# Patient Record
Sex: Male | Born: 1970
Health system: Southern US, Community
[De-identification: ages and names within clinical notes are randomized; demographics above are authoritative.]

## PROBLEM LIST (undated history)

## (undated) DIAGNOSIS — I5022 Chronic systolic (congestive) heart failure: Secondary | ICD-10-CM

## (undated) DIAGNOSIS — I255 Ischemic cardiomyopathy: Secondary | ICD-10-CM

## (undated) DIAGNOSIS — F1010 Alcohol abuse, uncomplicated: Secondary | ICD-10-CM

## (undated) DIAGNOSIS — F988 Other specified behavioral and emotional disorders with onset usually occurring in childhood and adolescence: Secondary | ICD-10-CM

## (undated) DIAGNOSIS — I2510 Atherosclerotic heart disease of native coronary artery without angina pectoris: Secondary | ICD-10-CM

## (undated) DIAGNOSIS — E785 Hyperlipidemia, unspecified: Secondary | ICD-10-CM

## (undated) DIAGNOSIS — F419 Anxiety disorder, unspecified: Secondary | ICD-10-CM

## (undated) DIAGNOSIS — I10 Essential (primary) hypertension: Secondary | ICD-10-CM

## (undated) DIAGNOSIS — F329 Major depressive disorder, single episode, unspecified: Secondary | ICD-10-CM

## (undated) DIAGNOSIS — F32A Depression, unspecified: Secondary | ICD-10-CM

## (undated) HISTORY — DX: Anxiety disorder, unspecified: F41.9

## (undated) HISTORY — DX: Depression, unspecified: F32.A

## (undated) HISTORY — DX: Ischemic cardiomyopathy: I25.5

## (undated) HISTORY — DX: Chronic systolic (congestive) heart failure: I50.22

## (undated) HISTORY — DX: Hyperlipidemia, unspecified: E78.5

## (undated) HISTORY — DX: Alcohol abuse, uncomplicated: F10.10

## (undated) HISTORY — DX: Other specified behavioral and emotional disorders with onset usually occurring in childhood and adolescence: F98.8

## (undated) HISTORY — DX: Major depressive disorder, single episode, unspecified: F32.9

## (undated) HISTORY — DX: Atherosclerotic heart disease of native coronary artery without angina pectoris: I25.10

---

## 2006-05-06 ENCOUNTER — Emergency Department: Payer: Self-pay | Admitting: Emergency Medicine

## 2010-07-16 ENCOUNTER — Ambulatory Visit: Payer: Self-pay | Admitting: Urology

## 2011-10-19 ENCOUNTER — Ambulatory Visit (INDEPENDENT_AMBULATORY_CARE_PROVIDER_SITE_OTHER): Payer: 59

## 2011-10-19 DIAGNOSIS — Z0000 Encounter for general adult medical examination without abnormal findings: Secondary | ICD-10-CM

## 2011-10-19 DIAGNOSIS — E782 Mixed hyperlipidemia: Secondary | ICD-10-CM

## 2011-10-19 DIAGNOSIS — Z23 Encounter for immunization: Secondary | ICD-10-CM

## 2011-10-21 ENCOUNTER — Encounter (INDEPENDENT_AMBULATORY_CARE_PROVIDER_SITE_OTHER): Payer: 59

## 2011-10-21 DIAGNOSIS — Z111 Encounter for screening for respiratory tuberculosis: Secondary | ICD-10-CM

## 2011-12-05 ENCOUNTER — Encounter: Payer: Self-pay | Admitting: Physician Assistant

## 2011-12-05 ENCOUNTER — Ambulatory Visit (INDEPENDENT_AMBULATORY_CARE_PROVIDER_SITE_OTHER): Payer: 59 | Admitting: Physician Assistant

## 2011-12-05 VITALS — BP 129/81 | HR 89 | Resp 16

## 2011-12-05 DIAGNOSIS — Z23 Encounter for immunization: Secondary | ICD-10-CM

## 2012-03-08 ENCOUNTER — Ambulatory Visit (INDEPENDENT_AMBULATORY_CARE_PROVIDER_SITE_OTHER): Payer: 59 | Admitting: Physician Assistant

## 2012-03-08 VITALS — BP 136/82 | HR 81 | Temp 98.3°F | Resp 16

## 2012-03-08 DIAGNOSIS — Z7189 Other specified counseling: Secondary | ICD-10-CM

## 2012-03-08 DIAGNOSIS — Z23 Encounter for immunization: Secondary | ICD-10-CM

## 2012-03-08 MED ORDER — INFLUENZA VIRUS VACC SPLIT PF IM SUSP
0.5000 mL | INTRAMUSCULAR | Status: AC
  Administered 2012-03-08: 0.5 mL via INTRAMUSCULAR

## 2012-03-08 NOTE — Progress Notes (Signed)
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  He has had tdap and ppd in Jan 2013.  He thinks he has had MMR and his wife is going to obtain his transcripts from school.  Today he needs influenza vaccine and varicella titers. He is due for his Hep B #3 next month.

## 2012-03-29 ENCOUNTER — Ambulatory Visit (INDEPENDENT_AMBULATORY_CARE_PROVIDER_SITE_OTHER): Payer: 59 | Admitting: Physician Assistant

## 2012-03-29 VITALS — BP 143/84 | HR 81 | Temp 98.3°F | Resp 16

## 2012-03-29 DIAGNOSIS — Z0000 Encounter for general adult medical examination without abnormal findings: Secondary | ICD-10-CM

## 2012-03-29 DIAGNOSIS — Z23 Encounter for immunization: Secondary | ICD-10-CM

## 2012-03-29 MED ORDER — MEASLES, MUMPS & RUBELLA VAC ~~LOC~~ INJ: 0.5000 mL | INJECTION | Freq: Once | SUBCUTANEOUS | Status: DC

## 2012-03-29 NOTE — Progress Notes (Signed)
  Subjective:    Patient ID: Shane [name], male    DOB: 1971-07-14, 41 y.o.   MRN: [id number]  HPI Had PE done and was told he needed to RTC for MMR only.  No patient/provider encounter   Review of Systems Not done    Objective:   Physical Exam  Not done      Assessment & Plan:  MMR given

## 2012-05-16 ENCOUNTER — Ambulatory Visit: Payer: 59

## 2012-05-16 ENCOUNTER — Ambulatory Visit (INDEPENDENT_AMBULATORY_CARE_PROVIDER_SITE_OTHER): Payer: 59 | Admitting: Family Medicine

## 2012-05-16 VITALS — BP 126/94 | HR 78 | Temp 97.8°F | Resp 16 | Ht 74.0 in | Wt 215.0 lb

## 2012-05-16 DIAGNOSIS — M79671 Pain in right foot: Secondary | ICD-10-CM

## 2012-05-16 DIAGNOSIS — M25579 Pain in unspecified ankle and joints of unspecified foot: Secondary | ICD-10-CM

## 2012-05-16 MED ORDER — PREDNISONE 20 MG PO TABS: ORAL_TABLET | ORAL | Status: AC

## 2012-05-16 NOTE — Progress Notes (Signed)
Is a 41 year old gentleman who twisted his foot about a week ago. He's had progressive swelling and pain over the last week. This may be secondary to having gone to visit 2 days this week and walked a lot. Denies fasciculations on the lateral right foot.   Objective: No acute distress Examination right ankle is normal Examination right foot shows swelling in the area of insertion of the peroneus longus with muscle fasciculations in that area. There is no ecchymosis or bony abnormality evident. [facility] reading (PRIMARY) by  Dr. Milus Glazier.   Assessment:

## 2012-05-22 IMAGING — US US PELVIS LIMITED
1 series · 17 of 25 positions shown · non-contrast
Comparison: none

REASON FOR EXAM: paraurethral mass
COMMENTS:

[Series 1: us pelvis limited · 17 of 84 slices shown]
[im 1/84]
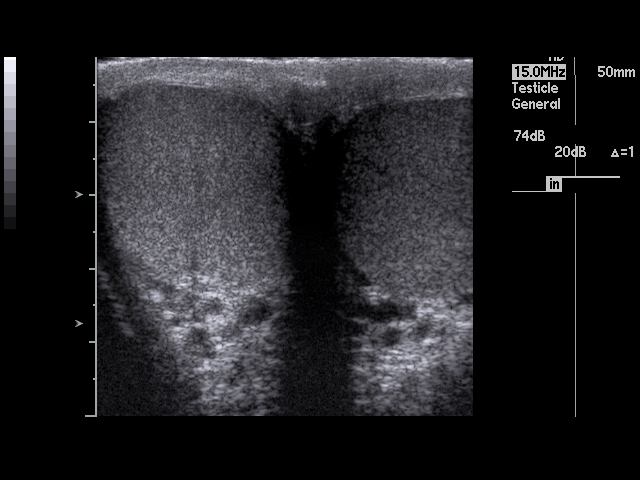
[im 7/84]
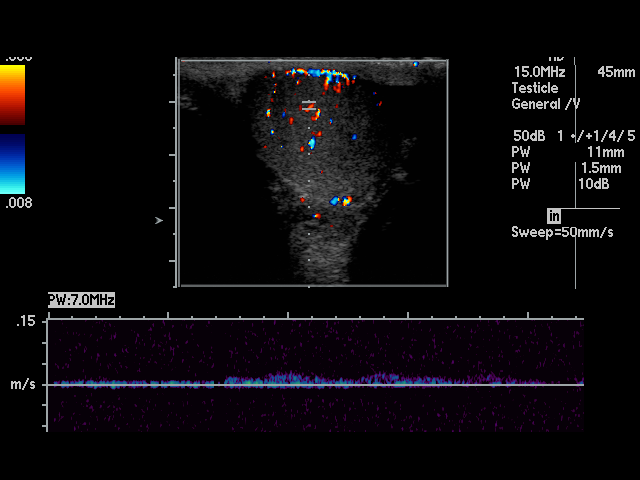
[im 11/84]
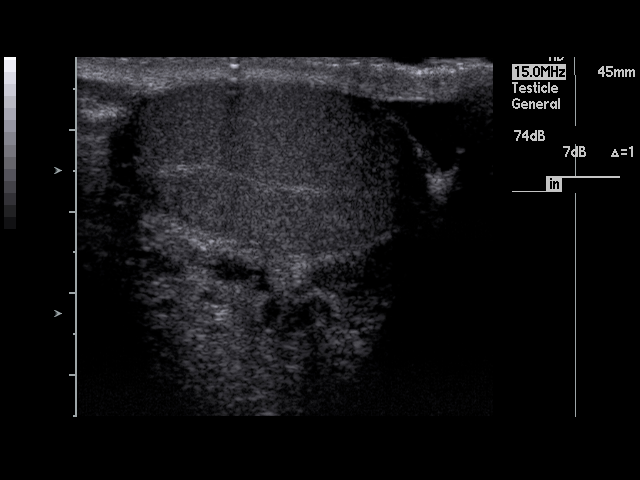
[im 18/84]
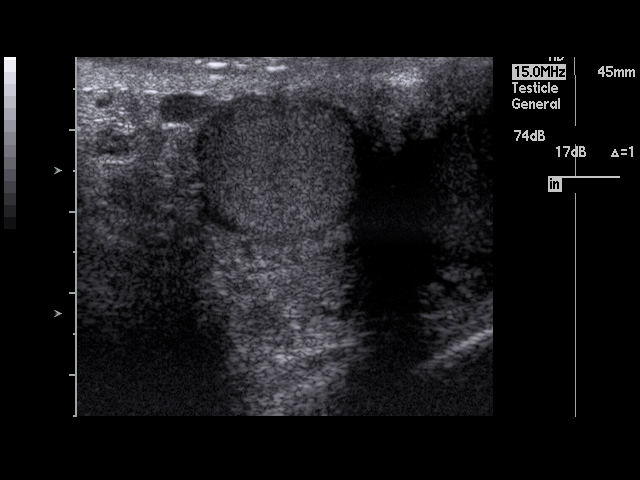
[im 21/84]
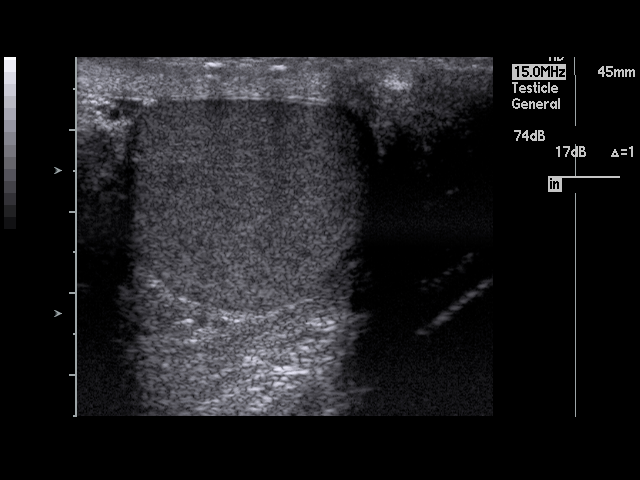
[im 28/84]
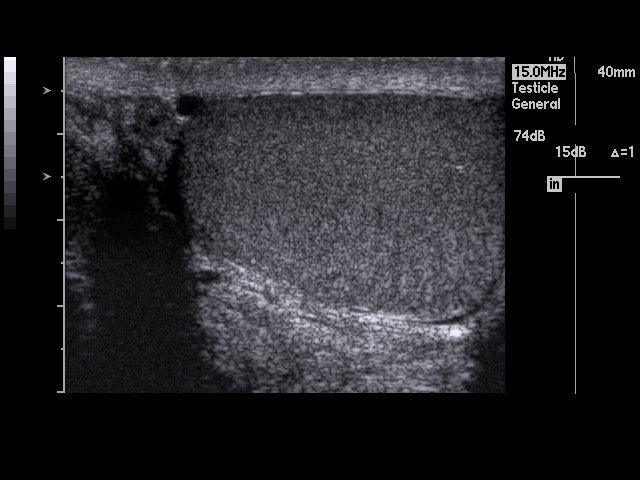
[im 32/84]
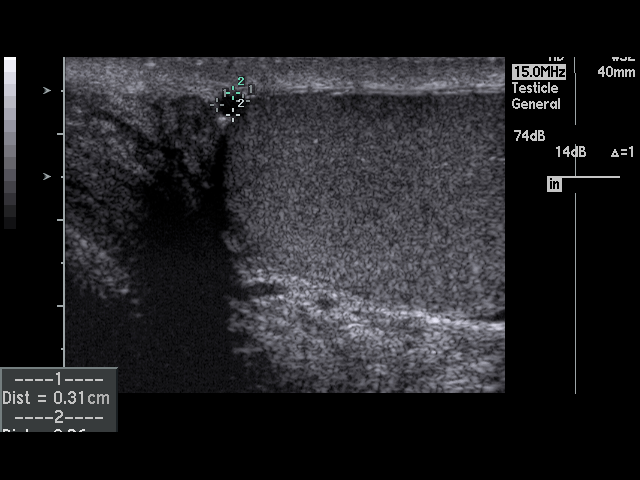
[im 39/84]
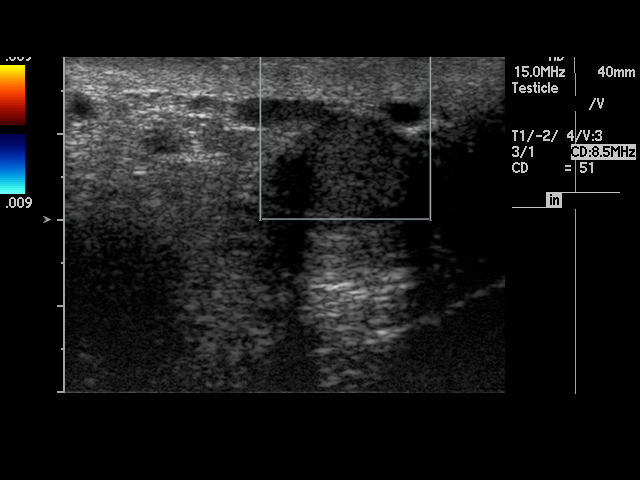
[im 42/84]
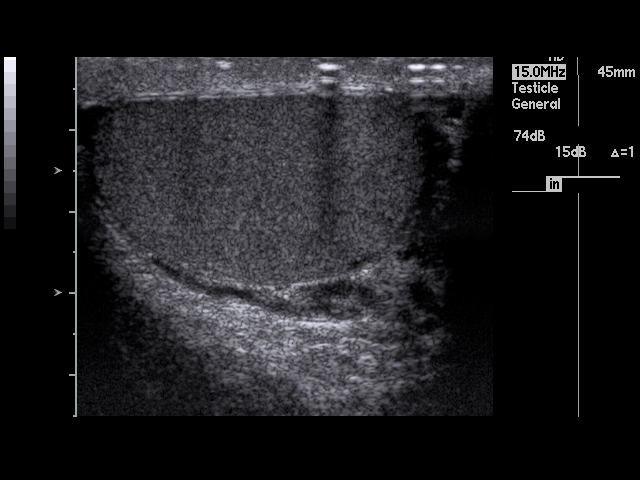
[im 45/84]
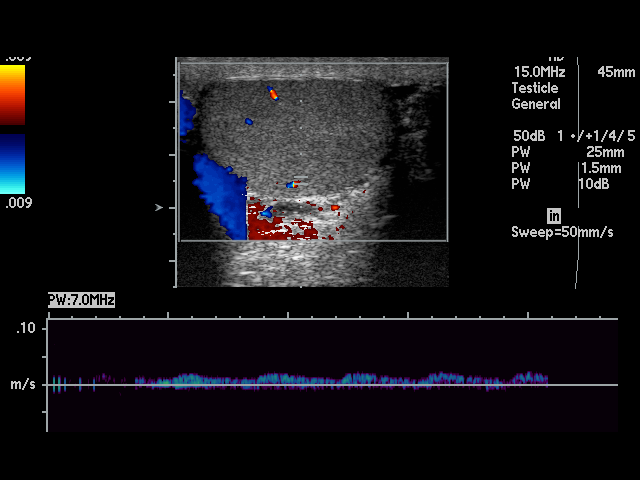
[im 52/84]
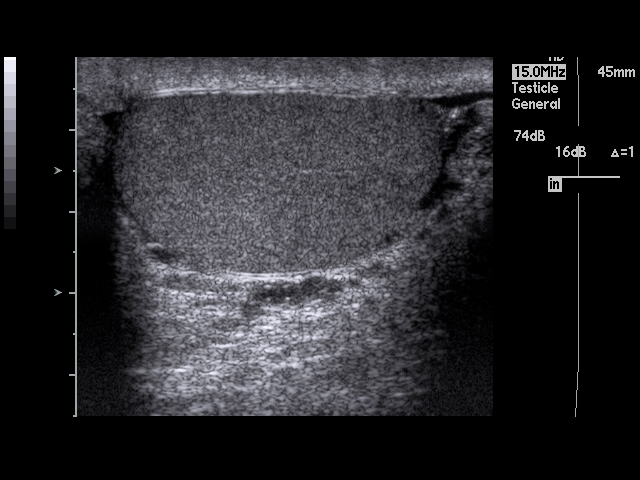
[im 56/84]
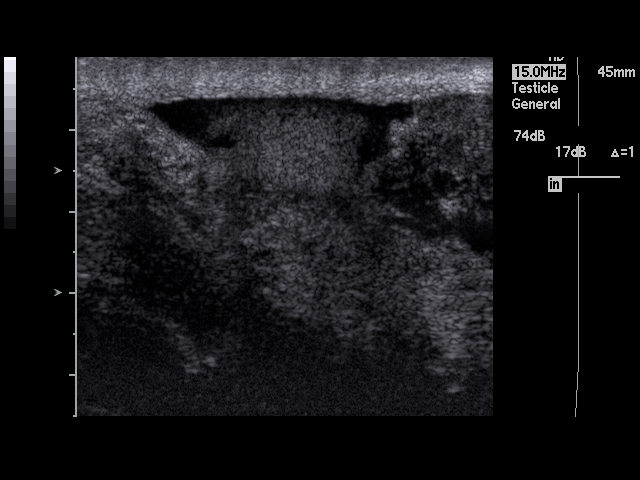
[im 63/84]
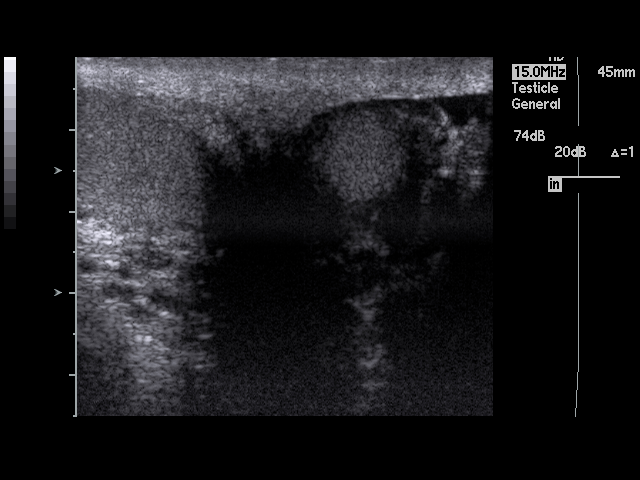
[im 66/84]
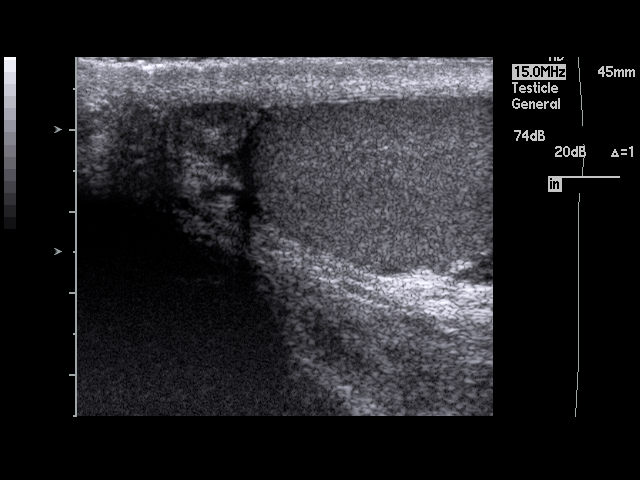
[im 73/84]
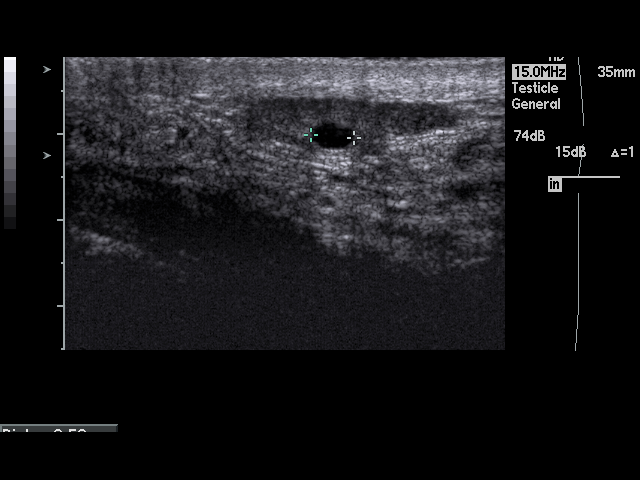
[im 77/84]
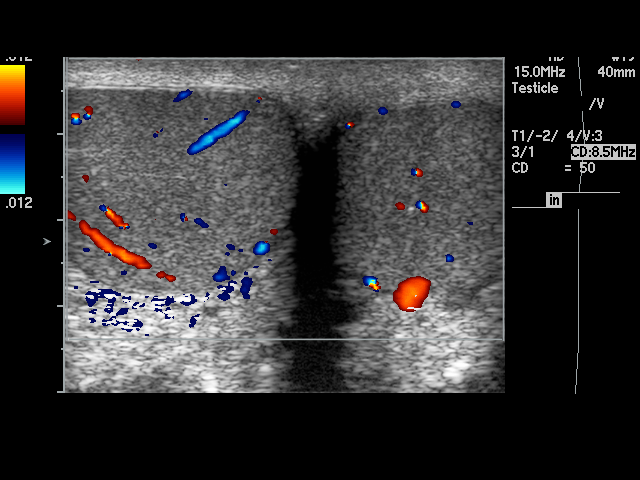
[im 84/84]
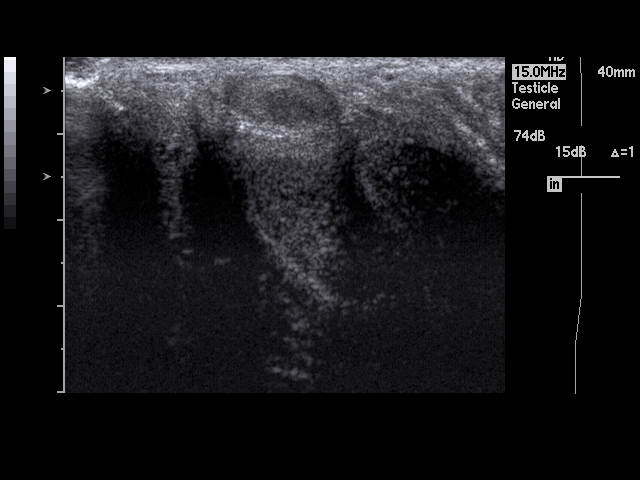

[17 of 25 positions shown; findings below may reference images not displayed]

PROCEDURE:     ATESOGLU - ATESOGLU TESTICULAR  - July 16, 2010 [DATE]

RESULT:     Testicular sonogram demonstrates the right testicle measures
4.26 x 3.03 x 2.73 cm. The left testicle is 3.97 x 2.98 x 2.32 cm. There is
a small hydrocele on the right. Color and SPECTRAL Doppler interrogation
shows arterial and venous flow is present in both testicles. The epididymal
appearance is within normal limits with small epididymal cysts bilaterally.
IMPRESSION: 1.     Epididymal cysts.
2.     Small right hydrocele.
3.     No evidence of torsion.
4.     No discrete mass evident.

## 2012-06-02 ENCOUNTER — Encounter: Payer: Self-pay | Admitting: Emergency Medicine

## 2012-06-02 ENCOUNTER — Ambulatory Visit (INDEPENDENT_AMBULATORY_CARE_PROVIDER_SITE_OTHER): Payer: 59 | Admitting: Emergency Medicine

## 2012-06-02 VITALS — BP 138/72 | HR 81 | Temp 98.7°F | Resp 18

## 2012-06-02 DIAGNOSIS — Z23 Encounter for immunization: Secondary | ICD-10-CM

## 2012-06-02 NOTE — Progress Notes (Signed)
  Subjective:    Patient ID: Shane [name], male    DOB: 1971-01-20, 41 y.o.   MRN: [id number]  HPI  Hepatitis B only  Review of Systems    Hepatitis B only Objective:   Physical Exam   Hepatitis B only     Assessment & Plan:  administered

## 2012-06-16 ENCOUNTER — Ambulatory Visit (INDEPENDENT_AMBULATORY_CARE_PROVIDER_SITE_OTHER): Payer: 59 | Admitting: Physician Assistant

## 2012-06-16 VITALS — BP 124/80 | HR 82 | Temp 97.6°F | Resp 16 | Ht 72.5 in | Wt 215.0 lb

## 2012-06-16 DIAGNOSIS — R21 Rash and other nonspecific skin eruption: Secondary | ICD-10-CM

## 2012-06-16 DIAGNOSIS — L509 Urticaria, unspecified: Secondary | ICD-10-CM

## 2012-06-16 DIAGNOSIS — L282 Other prurigo: Secondary | ICD-10-CM

## 2012-06-16 MED ORDER — PREDNISONE 20 MG PO TABS: ORAL_TABLET | ORAL | Status: AC

## 2012-06-16 MED ORDER — RANITIDINE HCL 150 MG PO TABS: 150.0000 mg | ORAL_TABLET | Freq: Two times a day (BID) | ORAL | Status: DC

## 2012-06-16 MED ORDER — METHYLPREDNISOLONE ACETATE 80 MG/ML IJ SUSP
80.0000 mg | Freq: Once | INTRAMUSCULAR | Status: AC
  Administered 2012-06-16: 80 mg via INTRAMUSCULAR

## 2012-06-16 NOTE — Progress Notes (Signed)
  Subjective:    Patient ID: Shane [name], male    DOB: 1971/09/23, 41 y.o.   MRN: [id number]  HPI 41 year old male presents today after being stung by several yellow jackets yesterday.  All of the stings were on the right side of his torso.  He has had swelling, warmth, and pruritis in the area. Denies SOB, difficulty breathing, lip/tongue swelling, or throat swelling. Has had a similar local reaction in the past.  He has taken several doses of benadryl since last night as well as claritin yesterday.  No medications today.      Review of Systems  All other systems reviewed and are negative.       Objective:   Physical Exam  Constitutional: He is oriented to person, place, and time. He appears well-developed and well-nourished.  HENT:  Head: Normocephalic and atraumatic.  Right Ear: External ear normal.  Left Ear: External ear normal.  Eyes: Conjunctivae normal are normal.  Neck: Normal range of motion.  Cardiovascular: Normal rate and normal heart sounds.   Pulmonary/Chest: Effort normal and breath sounds normal.  Neurological: He is alert and oriented to person, place, and time.  Skin:     Psychiatric: He has a normal mood and affect. His behavior is normal. Judgment and thought content normal.    Zyrtec 10 mg and Zantac 150 mg given today in office.       Assessment & Plan:   1. Rash and nonspecific skin eruption    2. Urticaria  methylPREDNISolone acetate (DEPO-MEDROL) injection 80 mg, predniSONE (DELTASONE) 20 MG tablet, ranitidine (ZANTAC) 150 MG tablet  3. Pruritic rash    Depomedrol 80 mg today Start prednisone tomorrow Claritin daily in the a.m. Benadryl at night Zantac bid RTC precautions

## 2012-12-17 ENCOUNTER — Ambulatory Visit (INDEPENDENT_AMBULATORY_CARE_PROVIDER_SITE_OTHER): Payer: 59 | Admitting: Physician Assistant

## 2012-12-17 VITALS — BP 138/98 | HR 108 | Temp 98.0°F | Resp 16 | Ht 72.5 in | Wt 203.4 lb

## 2012-12-17 DIAGNOSIS — E785 Hyperlipidemia, unspecified: Secondary | ICD-10-CM | POA: Insufficient documentation

## 2012-12-17 DIAGNOSIS — E7800 Pure hypercholesterolemia, unspecified: Secondary | ICD-10-CM

## 2012-12-17 DIAGNOSIS — F1020 Alcohol dependence, uncomplicated: Secondary | ICD-10-CM | POA: Insufficient documentation

## 2012-12-17 DIAGNOSIS — F411 Generalized anxiety disorder: Secondary | ICD-10-CM

## 2012-12-17 DIAGNOSIS — R030 Elevated blood-pressure reading, without diagnosis of hypertension: Secondary | ICD-10-CM

## 2012-12-17 LAB — POCT CBC
MCH, POC: 30.5 pg (ref 27–31.2)
MCHC: 32.1 g/dL (ref 31.8–35.4)
MCV: 94.8 fL (ref 80–97)
MID (cbc): 0.3 (ref 0–0.9)
MPV: 10.3 fL (ref 0–99.8)
POC LYMPH PERCENT: 28.3 %L (ref 10–50)
POC MID %: 6.1 %M (ref 0–12)
Platelet Count, POC: 308 10*3/uL (ref 142–424)
RDW, POC: 14.4 %
WBC: 4.5 10*3/uL — AB (ref 4.6–10.2)

## 2012-12-17 MED ORDER — BUPROPION HCL ER (SR) 150 MG PO TB12: 150.0000 mg | ORAL_TABLET | Freq: Two times a day (BID) | ORAL | Status: DC

## 2012-12-17 NOTE — Patient Instructions (Addendum)
Start wellbutrin tomorrow and take 1 daily each morning.  If you are tolerating this ok after 4 or 5 days, you may increase to twice daily

## 2012-12-17 NOTE — Progress Notes (Signed)
  Subjective:    Patient ID: Shane [name], male    DOB: [birth year], 42 y.o.   MRN: [id number]  HPI 42 yr old CM presents with feelings of depression.  He has been drinking too much alcohol and is having to stay at his parent's house. (his wife and kids are staying in the family home). He admits to over-drinking at times.  He has never been to treatment or done any alcohol cessation program.  He has never been to an AA meeting.  He feels a lot of shame and guilt.  He feels as though he has no will-power. He does not have SI/HI. He has never been on medication for anxiety or depression.  He does feel as though he may have an underlying anxiety d/o that has never been treated.  He states he worries excessively and can't stop thinking.  He usu takes advil PM to help him get to sleep.  He hasn't taken his lipitor in >1year.  Review of Systems  All other systems reviewed and are negative.      Objective:   Physical Exam  Nursing note and vitals reviewed. Constitutional: He is oriented to person, place, and time. He appears well-developed and well-nourished.  HENT:  Head: Normocephalic and atraumatic.  Neck: Normal range of motion. Neck supple.  Cardiovascular: Normal rate, regular rhythm and normal heart sounds.   Pulmonary/Chest: Effort normal and breath sounds normal.  Neurological: He is alert and oriented to person, place, and time.  Skin: Skin is warm and dry.  Psychiatric: He has a normal mood and affect. His behavior is normal. Judgment and thought content normal.     Results for orders placed in visit on 12/17/12  POCT CBC      Result Value Range   WBC 4.5 (*) 4.6 - 10.2 K/uL   Lymph, poc 1.3  0.6 - 3.4   POC LYMPH PERCENT 28.3  10 - 50 %L   MID (cbc) 0.3  0 - 0.9   POC MID % 6.1  0 - 12 %M   POC Granulocyte 3.0  2 - 6.9   Granulocyte percent 65.6  37 - 80 %G   RBC 5.32  4.69 - 6.13 M/uL   Hemoglobin 16.2  14.1 - 18.1 g/dL   HCT, POC 78.2  95.6 - 53.7 %   MCV 94.8  80 - 97  fL   MCH, POC 30.5  27 - 31.2 pg   MCHC 32.1  31.8 - 35.4 g/dL   RDW, POC 21.3     Platelet Count, POC 308  142 - 424 K/uL   MPV 10.3  0 - 99.8 fL       Assessment & Plan:  Hypertension likely related to excess alcohol or anxiety or both.  He has never had BP problems.  Counseled patient at length about alcoholism treatment options from IOP to 30day IP programs.  Discussed halfway houses, AA meetings, and I offered assistance getting connected to these in any way possible.  Check BP out of office. Cut back/cut out sleeping meds. Depression-he will start wellbutrin and see me in 3 weeks.  Hyperlipidemia-will reassess in a couple of months.  With his recent heavy drinking, I dont want to create excess stress to his liver and we can restart this as he works on and maintains sobriety. Spent 45 mins face to face with patient.

## 2012-12-18 LAB — COMPREHENSIVE METABOLIC PANEL
Alkaline Phosphatase: 90 U/L (ref 39–117)
CO2: 27 mEq/L (ref 19–32)
Creat: 1.01 mg/dL (ref 0.50–1.35)
Glucose, Bld: 87 mg/dL (ref 70–99)
Sodium: 138 mEq/L (ref 135–145)
Total Bilirubin: 0.8 mg/dL (ref 0.3–1.2)
Total Protein: 7.6 g/dL (ref 6.0–8.3)

## 2012-12-18 LAB — TSH: TSH: 1.995 u[IU]/mL (ref 0.350–4.500)

## 2012-12-23 ENCOUNTER — Ambulatory Visit (INDEPENDENT_AMBULATORY_CARE_PROVIDER_SITE_OTHER): Payer: 59 | Admitting: Family Medicine

## 2012-12-23 VITALS — BP 160/98 | HR 94 | Temp 97.9°F | Resp 16 | Ht 73.5 in | Wt 197.0 lb

## 2012-12-23 DIAGNOSIS — F1010 Alcohol abuse, uncomplicated: Secondary | ICD-10-CM

## 2012-12-23 DIAGNOSIS — Z719 Counseling, unspecified: Secondary | ICD-10-CM

## 2012-12-23 DIAGNOSIS — F329 Major depressive disorder, single episode, unspecified: Secondary | ICD-10-CM

## 2012-12-23 NOTE — Progress Notes (Signed)
Subjective:    Patient ID: Shane [name], male    DOB: 08-24-1971, 42 y.o.   MRN: [id number]  HPI Shane [name] is a 42 y.o. male "Shane [name]" was  seen 6 days ago - note reviewed -  with discussion of depression, and alcohol abuse.  Elevated BP at that time thought secondary to alcohol.  treatment options from IOP to 30day IP programs were discussed as well as halfway houses, AA meetings, cut back/cut out sleeping meds. Started Wellbutrin 150mg  BID.    Trouble coping with being kicked out of the house 1 week ago.  Guilty and shame for things done.  2 emotional breakdowns this week. Would like to meet one on one with a counselor, wants to fix relationship. Guilty, shameful. Not wanting to eat. Is taking Wellbutrin twice daily.  Last alcohol use 8 days ago. None since that time.  Similar sx's in past.  Went to [employer] meeting once last week.  Felt awkward. Taking otc melatonin.  Less advil pm.   No suicidal or homicidal thoughts or plan.   Not exercising.  Has disconnected from friends that were increasing   Outside BP 126/81 out of office.  Nervous in office.   Prior note reviewed.  Results for orders placed in visit on 12/17/12  COMPREHENSIVE METABOLIC PANEL      Result Value Range   Sodium 138  135 - 145 mEq/L   Potassium 3.9  3.5 - 5.3 mEq/L   Chloride 102  96 - 112 mEq/L   CO2 27  19 - 32 mEq/L   Glucose, Bld 87  70 - 99 mg/dL   BUN 15  6 - 23 mg/dL   Creat 4.09  8.11 - 9.14 mg/dL   Total Bilirubin 0.8  0.3 - 1.2 mg/dL   Alkaline Phosphatase 90  39 - 117 U/L   AST 20  0 - 37 U/L   ALT 23  0 - 53 U/L   Total Protein 7.6  6.0 - 8.3 g/dL   Albumin 4.7  3.5 - 5.2 g/dL   Calcium 9.9  8.4 - 78.2 mg/dL  TSH      Result Value Range   TSH 1.995  0.350 - 4.500 uIU/mL  POCT CBC      Result Value Range   WBC 4.5 (*) 4.6 - 10.2 K/uL   Lymph, poc 1.3  0.6 - 3.4   POC LYMPH PERCENT 28.3  10 - 50 %L   MID (cbc) 0.3  0 - 0.9   POC MID % 6.1  0 - 12 %M   POC Granulocyte 3.0  2 - 6.9    Granulocyte percent 65.6  37 - 80 %G   RBC 5.32  4.69 - 6.13 M/uL   Hemoglobin 16.2  14.1 - 18.1 g/dL   HCT, POC 95.6  21.3 - 53.7 %   MCV 94.8  80 - 97 fL   MCH, POC 30.5  27 - 31.2 pg   MCHC 32.1  31.8 - 35.4 g/dL   RDW, POC 08.6     Platelet Count, POC 308  142 - 424 K/uL   MPV 10.3  0 - 99.8 fL    Review of Systems  Psychiatric/Behavioral: Positive for sleep disturbance. Negative for suicidal ideas and self-injury. The patient is nervous/anxious.       Objective:   Physical Exam  Vitals reviewed. Constitutional: He is oriented to person, place, and time. He appears well-developed and well-nourished.  HENT:  Head: Normocephalic and atraumatic.  Eyes: EOM are normal. Pupils are equal, round, and reactive to light.  Neck: No JVD present. Carotid bruit is not present.  Cardiovascular: Normal rate, regular rhythm and normal heart sounds.   No murmur heard. Pulmonary/Chest: Effort normal and breath sounds normal. He has no rales.  Musculoskeletal: He exhibits no edema.  Neurological: He is alert and oriented to person, place, and time.  Skin: Skin is warm and dry.  Psychiatric: His speech is normal and behavior is normal. Judgment and thought content normal. His mood appears anxious. Cognition and memory are normal. He expresses no homicidal and no suicidal ideation.       Assessment & Plan:  Shane [name] is a 42 y.o. male Alcohol abuse, with Depression, Counseling NOS Commended on efforts so far in alcohol cessation, avoidance of groups that trigger drinking, and AA meeting, but reinforced long term abstinence plan, and need for continued behavior change for improved relationship with spouse. Numbers for counseling given, continue Wellbutrin, and advised exercise most days of the week. Recheck in 1 week. Sooner if worse. BP likely white coat component and etoh.  Rtc/er precautions discussed.   Spent over 77min's counseling/face to face care.    Patient Instructions   Exercise most days of week. Look into other AA meetings - at least once per week. Continue Wellbutrin.  Recheck in 1 week with myself or Angela.  Here are a few counselors - call to schedule appointment.  Karmen Bongo: [phone] Or Vernell Leep: [phone]

## 2012-12-23 NOTE — Patient Instructions (Signed)
Exercise most days of week. Look into other AA meetings - at least once per week. Continue Wellbutrin.  Recheck in 1 week with myself or Angela.  Here are a few counselors - call to schedule appointment.  Karmen Bongo: [phone] Or Vernell Leep: [phone]

## 2013-01-05 ENCOUNTER — Ambulatory Visit (INDEPENDENT_AMBULATORY_CARE_PROVIDER_SITE_OTHER): Payer: 59 | Admitting: Family Medicine

## 2013-01-05 VITALS — BP 122/80 | HR 93 | Temp 98.2°F | Resp 18 | Ht 72.0 in | Wt 187.8 lb

## 2013-01-05 DIAGNOSIS — E785 Hyperlipidemia, unspecified: Secondary | ICD-10-CM

## 2013-01-05 DIAGNOSIS — N529 Male erectile dysfunction, unspecified: Secondary | ICD-10-CM

## 2013-01-05 DIAGNOSIS — F1011 Alcohol abuse, in remission: Secondary | ICD-10-CM

## 2013-01-05 DIAGNOSIS — F329 Major depressive disorder, single episode, unspecified: Secondary | ICD-10-CM

## 2013-01-05 NOTE — Patient Instructions (Signed)
Continue aa meetings, folow up with counseling next week, and fasting labs in next week  (lab only visit ok). To look up more info on your condition, go to the website [email or web address], then on patient resources - select [facility]. Under patient resources, select Insomnia.  We can discuss other techniques to help with sleep, but also discuss this with your counselor.   Return to the clinic or go to the nearest emergency room if any of your symptoms worsen or new symptoms occur. Recheck in next 2-3 weeks.

## 2013-01-05 NOTE — Progress Notes (Signed)
Subjective:    Patient ID: Shane [name], male    DOB: 31-Dec-1970, 42 y.o.   MRN: [id number]  HPI Tomothy Eddins is a 42 y.o. male  See prior ov's - Vincenza Hews was seen by Georgian Co 12/17/12,  then in follow up with me 12/23/12. -  Discussed depression and alcohol abuse. Started Wellbutrin 150mg  BID on 12/17/12, AA meeting attended, and disconnected from friends that were increasing use of alcohol. Phone numbers for counseling given.   Has appt with Karmen Bongo in 4 days.  Still taking Wellbutrin twice per day.  Seems to be helping. Feels like mood is better. Still dealing with same issues of fixing relationship and working on his issues. Has been to 5-6 AA meetings. 2-3 times per week.  No recent alcohol.  Last drink 21 days ago. No suicidal thoughts.  Still living with parents, not ready to get apartment yet as wife has recommended.  Had an emotional breakdown last Monday, but talked to pastor and friend that has also struggled with alcohol in the past. wife not wearing wedding ring, and not being able to see kids at night. Able to see kids when wife working. Has been keeping journal of positive things - this has helped with self defeating thoughts and guilty feelings.   Had vasectomy 7 years ago - thinks some of depression sx's or drinking around that time.  Trouble with erections over past 4-5 years.  Has not had testosterone level checked.   BP in 130/80's out of office last week.   Still working 7am-3pm, no problems at work - doing well.  No new side effects of meds.   Also on lipitor prior - ran out a year ago. Has lost weight with less appetite past few months.   Review of Systems  Genitourinary: Negative for scrotal swelling (no new lumps/nodules/masses. ) and testicular pain.  Psychiatric/Behavioral: Positive for sleep disturbance (trouble with mind racing and over thinking. ). Negative for suicidal ideas.   As above.     Objective:   Physical Exam  Vitals  reviewed. Constitutional: He is oriented to person, place, and time. He appears well-developed and well-nourished.  HENT:  Head: Normocephalic and atraumatic.  Eyes: EOM are normal. Pupils are equal, round, and reactive to light.  Neck: No JVD present. Carotid bruit is not present.  Cardiovascular: Normal rate, regular rhythm and normal heart sounds.   No murmur heard. Pulmonary/Chest: Effort normal and breath sounds normal. He has no rales.  Musculoskeletal: He exhibits no edema.  Neurological: He is alert and oriented to person, place, and time.  Skin: Skin is warm and dry.  Psychiatric: He has a normal mood and affect. His behavior is normal. Judgment and thought content normal. Cognition and memory are normal. He expresses no homicidal and no suicidal ideation.          Assessment & Plan:  Chad Donoghue is a 42 y.o. male Other and unspecified hyperlipidemia - Plan: Lipid panel, Comprehensive metabolic panel  Erectile dysfunction - Plan: Testosterone  Depression  History of alcohol abuse  Depression with likely component of substance induced mood d/o.  Improving off alcohol. Continue wellbutrin, AA mtgs, and follow up with counselor.  Discussed need for persistent behavior change, but commended on current efforts.   Insomnia - likley d/t depression/anxiety.  Deferred meds at this point due to prior alcohol use/abuse.  UTD for info and to discuss with counselor.   Hx of hyperlipidemia - off meds. Check fasting labs, then to  decide on meds.   ED intermitent since vasectomy. conicides with alcohol use and depressed sx's.  Will check am testosterone, but unlikely cause of other sx's.   Patient Instructions  Continue aa meetings, folow up with counseling next week, and fasting labs in next week  (lab only visit ok). To look up more info on your condition, go to the website [email or web address], then on patient resources - select [facility]. Under patient resources, select Insomnia.   We can discuss other techniques to help with sleep, but also discuss this with your counselor.   Return to the clinic or go to the nearest emergency room if any of your symptoms worsen or new symptoms occur. Recheck in next 2-3 weeks.

## 2013-01-13 ENCOUNTER — Other Ambulatory Visit (INDEPENDENT_AMBULATORY_CARE_PROVIDER_SITE_OTHER): Payer: 59

## 2013-01-13 DIAGNOSIS — E785 Hyperlipidemia, unspecified: Secondary | ICD-10-CM

## 2013-01-13 DIAGNOSIS — N529 Male erectile dysfunction, unspecified: Secondary | ICD-10-CM

## 2013-01-13 LAB — COMPREHENSIVE METABOLIC PANEL
ALT: 16 U/L (ref 0–53)
Albumin: 4.2 g/dL (ref 3.5–5.2)
CO2: 28 mEq/L (ref 19–32)
Calcium: 9.9 mg/dL (ref 8.4–10.5)
Chloride: 102 mEq/L (ref 96–112)
Glucose, Bld: 116 mg/dL — ABNORMAL HIGH (ref 70–99)
Potassium: 4.5 mEq/L (ref 3.5–5.3)
Sodium: 136 mEq/L (ref 135–145)
Total Bilirubin: 0.9 mg/dL (ref 0.3–1.2)
Total Protein: 6.9 g/dL (ref 6.0–8.3)

## 2013-01-13 LAB — LIPID PANEL: LDL Cholesterol: 122 mg/dL — ABNORMAL HIGH (ref 0–99)

## 2013-01-25 ENCOUNTER — Telehealth: Payer: Self-pay | Admitting: Radiology

## 2013-01-25 NOTE — Telephone Encounter (Signed)
Please advise on labs for patient. I can call him, his glucose slightly elevated, what would you like me to advise?

## 2013-01-25 NOTE — Telephone Encounter (Signed)
Thanks. His glucose was slightly elevated, but likely ok as nonfasting. LDL cholesterol slightly high, HDL slightly low. Testosterone ok. No new meds/changes for now. Watch diet and recheck these levels in next 3-6 months.

## 2013-01-26 NOTE — Telephone Encounter (Signed)
Patient was fasting for labs. Advised him of recheck needed in 3-6 months he agrees to this plan.

## 2013-04-06 ENCOUNTER — Ambulatory Visit (INDEPENDENT_AMBULATORY_CARE_PROVIDER_SITE_OTHER): Payer: 59 | Admitting: Internal Medicine

## 2013-04-06 VITALS — BP 118/70 | HR 77 | Temp 98.0°F | Resp 16 | Ht 72.0 in | Wt 190.0 lb

## 2013-04-06 DIAGNOSIS — M79609 Pain in unspecified limb: Secondary | ICD-10-CM

## 2013-04-06 DIAGNOSIS — B070 Plantar wart: Secondary | ICD-10-CM

## 2013-04-06 DIAGNOSIS — F988 Other specified behavioral and emotional disorders with onset usually occurring in childhood and adolescence: Secondary | ICD-10-CM

## 2013-04-06 MED ORDER — AMPHETAMINE-DEXTROAMPHETAMINE 10 MG PO TABS: 10.0000 mg | ORAL_TABLET | Freq: Two times a day (BID) | ORAL | Status: DC

## 2013-04-06 MED ORDER — HYDROCODONE-ACETAMINOPHEN 5-325 MG PO TABS: 1.0000 | ORAL_TABLET | Freq: Four times a day (QID) | ORAL | Status: DC | PRN

## 2013-04-06 NOTE — Progress Notes (Signed)
   Patient ID: Shane [name] MRN: [id number], DOB: 08-27-1971, 42 y.o. Date of Encounter: 04/06/2013, 11:12 AM   PROCEDURE NOTE: Verbal consent obtained.  Numbing: Local anesthesia obtained with 2cc of 1% lidocaine with epinephrine.  Betadine prep per usual protocol.  Plantar wart excised in total from left foot.  Hemostasis obtained with silver nitrate. Wound cleansed and dressed. Wound care instructions including precautions covered with patient.   Signed, Eula Listen, PA-C 04/06/2013 11:12 AM

## 2013-04-07 DIAGNOSIS — F988 Other specified behavioral and emotional disorders with onset usually occurring in childhood and adolescence: Secondary | ICD-10-CM | POA: Insufficient documentation

## 2013-04-07 NOTE — Progress Notes (Signed)
  Subjective:    Patient ID: Shane [name], male    DOB: [birth year], 42 y.o.   MRN: [id number]  HPI referred back by psychologist Karmen Bongo with a diagnosis of attention deficit disorder which is felt to be very important given his other psychological symptoms over the past several years. This explains a lot about his performance in middle school and high school and his subsequent problems at work. He has suffered from significant distractibility impairing performance. He has significant procrastination, often misplaces things at home and work, forgets about appointments or obligations, fidgets and squirms frequently, As a hard time relaxing, has a hard time finishing projects, and has a hard time with focus on repetitive tasks with lots of boredom.  His problems noted by Dr. Chilton Si have improved with therapy.  He also complains of a persistent wart on the bottom of his right foot. He has tried self treatment including cutting of this wart and has been unsuccessful.    Review of Systems Noncontributory    Objective:   Physical Exam BP 118/70  Pulse 77  Temp(Src) 98 F (36.7 C) (Oral)  Resp 16  Ht 6' (1.829 m)  Wt 190 lb (86.183 kg)  BMI 25.76 kg/m2  SpO2 99% HEENT clear Heart regular Neurological intact Plantar wart 0.5 CM on right foot plantar aspect and removed physician assistant dunn   ASRS_ADHD pos at 46(highly likely to have ADD with a score over 24)    Assessment & Plan:  Attention deficit disorder-trial of medication beginning at 10 mg with followup in one month/ or sooner if needed Plantar wart removal-discussed moleskin for padding Meds ordered this encounter  Medications  . amphetamine-dextroamphetamine (ADDERALL) 10 MG tablet    Sig: Take 1 tablet (10 mg total) by mouth 2 (two) times daily.    Dispense:  60 tablet    Refill:  0  . HYDROcodone-acetaminophen (NORCO/VICODIN) 5-325 MG per tablet    Sig: Take 1 tablet by mouth every 6 (six) hours as needed for  pain.    Dispense:  20 tablet    Refill:  0

## 2013-04-26 ENCOUNTER — Telehealth: Payer: Self-pay | Admitting: Radiology

## 2013-04-26 NOTE — Telephone Encounter (Signed)
Have we gotten prior auth request? For Adderall

## 2013-04-27 NOTE — Telephone Encounter (Signed)
Prior auth request given to [facility]

## 2013-04-27 NOTE — Telephone Encounter (Signed)
Called ins yesterday, they are faxing form to complete. Have not received it yet.

## 2013-04-27 NOTE — Telephone Encounter (Signed)
Completed form and faxed in.

## 2013-04-30 NOTE — Telephone Encounter (Signed)
PA approved for adderall through 10/03/2038. Pt and pharmacy aware.

## 2013-05-25 ENCOUNTER — Telehealth: Payer: Self-pay | Admitting: Radiology

## 2013-05-25 DIAGNOSIS — F988 Other specified behavioral and emotional disorders with onset usually occurring in childhood and adolescence: Secondary | ICD-10-CM

## 2013-05-25 MED ORDER — AMPHETAMINE-DEXTROAMPHETAMINE 15 MG PO TABS: 15.0000 mg | ORAL_TABLET | Freq: Two times a day (BID) | ORAL | Status: DC

## 2013-05-25 NOTE — Telephone Encounter (Signed)
Meds ordered this encounter  Medications  . amphetamine-dextroamphetamine (ADDERALL) 15 MG tablet    Sig: Take 1 tablet (15 mg total) by mouth 2 (two) times daily.    Dispense:  60 tablet    Refill:  0   F/u 1 mo

## 2013-05-25 NOTE — Telephone Encounter (Signed)
Patient needs a refill of adderall 10mg .  Almost out.  Patient states one pill is not enough but 2 pills are too much.  So 1.5tab/15mg  works perfectly.

## 2013-05-28 NOTE — Telephone Encounter (Signed)
[facility] ready to pick up. In pick up draw.

## 2013-06-14 ENCOUNTER — Ambulatory Visit (INDEPENDENT_AMBULATORY_CARE_PROVIDER_SITE_OTHER): Payer: 59 | Admitting: Physician Assistant

## 2013-06-14 VITALS — BP 120/76 | HR 84 | Temp 98.8°F | Resp 20 | Ht 72.5 in | Wt 188.6 lb

## 2013-06-14 DIAGNOSIS — H5712 Ocular pain, left eye: Secondary | ICD-10-CM

## 2013-06-14 DIAGNOSIS — H18822 Corneal disorder due to contact lens, left eye: Secondary | ICD-10-CM

## 2013-06-14 DIAGNOSIS — H18829 Corneal disorder due to contact lens, unspecified eye: Secondary | ICD-10-CM

## 2013-06-14 DIAGNOSIS — H5710 Ocular pain, unspecified eye: Secondary | ICD-10-CM

## 2013-06-14 MED ORDER — CIPROFLOXACIN HCL 0.3 % OP SOLN: 1.0000 [drp] | OPHTHALMIC | Status: DC

## 2013-06-14 NOTE — Progress Notes (Signed)
Subjective:    Patient ID: Shane [name], male    DOB: 1971/01/01, 42 y.o.   MRN: [id number]  HPI This 42 y.o. male presents for evaluation of LEFT eye redness and pain upon waking this morning.  Progressively worse as the day has gone by.  Watery drainage.  Very light sensitive.  No history of FB in eye.  Wears disposable contact lenses, changed monthly.  This pair <2 weeks.  Medications, allergies, past medical history, surgical history, family history, social history and problem list reviewed.    Review of Systems As above.    Objective:   Physical Exam  Vitals reviewed. Constitutional: He is oriented to person, place, and time. Vital signs are normal. He appears well-developed and well-nourished. He is active and cooperative. No distress (but obviously uncomfortable).  HENT:  Head: Normocephalic and atraumatic. Head is without right periorbital erythema and without left periorbital erythema.  Right Ear: External ear normal.  Left Ear: External ear normal.  Nose: Nose normal.  Mouth/Throat: Oropharynx is clear and moist. No oropharyngeal exudate.  Eyes: EOM are normal. Pupils are equal, round, and reactive to light. Lids are everted and swept, no foreign bodies found. Right eye exhibits no chemosis, no discharge and no hordeolum. No foreign body present in the right eye. Left eye exhibits discharge (watery). Left eye exhibits no chemosis and no hordeolum. No foreign body present in the left eye. Right conjunctiva is not injected. Right conjunctiva has no hemorrhage. Left conjunctiva is injected. Left conjunctiva has a hemorrhage. No scleral icterus.  Fundoscopic exam:      The right eye shows no hemorrhage and no papilledema. The right eye shows red reflex.       The left eye shows no hemorrhage and no papilledema. The left eye shows red reflex.    2 drops of proparacaine instilled in the LEFT eye, fluorescein instilled.  Round area of increased stain uptake at 2 o'clock just  inside the iris.  At 3:30-4 o'clock, adjacent to the iris, there is a round area, without stain uptake, that appears white, as though it is less injected than the surrounding sclera. The eye was copiously irrigated with saline.  Neck: Normal range of motion. Neck supple. No thyromegaly present.  Cardiovascular: Normal rate.   Pulmonary/Chest: Effort normal.  Lymphadenopathy:    He has no cervical adenopathy.  Neurological: He is alert and oriented to person, place, and time. No cranial nerve deficit.  Skin: Skin is warm and dry.  Psychiatric: He has a normal mood and affect. His behavior is normal. Thought content normal.    Visual Acuity Screening   Right eye Left eye Both eyes  Without correction: 20/20-1 20/25-1 20/25-1  With correction:             Assessment & Plan:  Corneal abrasion due to contact lens, left - Plan: ciprofloxacin (CILOXAN) 0.3 % ophthalmic solution  Eye pain, left  Patient Instructions  Schedule with your eye specialist in the next 1-2 days. Do not wear a contact lens in the affected eye until your pain is resolved. Wearing sunglasses will feel better.  ADDENDUM: Received a call from the patient's wife, who works in our office.  The patient elected to go ahead and see his eye specialist today, since he could get an appointment, rather than waiting until tomorrow.  The pale area I noted appears to be a rare type of ulcer, and he has been referred to another specialist and will have very close follow-up.  He was prescribed a different antibiotic drop, that he is to use hourly until his follow-up tomorrow afternoon.  The patient and his wife will advise me of the details as they learn them.  Fernande Bras, PA-C Physician Assistant-Certified [facility]

## 2013-06-14 NOTE — Patient Instructions (Signed)
Schedule with your eye specialist in the next 1-2 days. Do not wear a contact lens in the affected eye until your pain is resolved. Wearing sunglasses will feel better.

## 2013-06-15 ENCOUNTER — Telehealth: Payer: Self-pay | Admitting: Radiology

## 2013-06-15 NOTE — Telephone Encounter (Signed)
Wife given work note for patient.

## 2013-06-26 ENCOUNTER — Ambulatory Visit (INDEPENDENT_AMBULATORY_CARE_PROVIDER_SITE_OTHER): Payer: 59 | Admitting: Internal Medicine

## 2013-06-26 VITALS — BP 120/78 | HR 88 | Temp 99.0°F | Resp 20 | Ht 72.75 in | Wt 189.8 lb

## 2013-06-26 DIAGNOSIS — H16002 Unspecified corneal ulcer, left eye: Secondary | ICD-10-CM

## 2013-06-26 DIAGNOSIS — H16009 Unspecified corneal ulcer, unspecified eye: Secondary | ICD-10-CM

## 2013-06-26 DIAGNOSIS — F988 Other specified behavioral and emotional disorders with onset usually occurring in childhood and adolescence: Secondary | ICD-10-CM

## 2013-06-26 MED ORDER — AMPHETAMINE-DEXTROAMPHETAMINE 15 MG PO TABS: 15.0000 mg | ORAL_TABLET | Freq: Two times a day (BID) | ORAL | Status: DC

## 2013-06-26 MED ORDER — BUPROPION HCL ER (SR) 150 MG PO TB12: 150.0000 mg | ORAL_TABLET | Freq: Two times a day (BID) | ORAL | Status: DC

## 2013-06-27 NOTE — Progress Notes (Signed)
  Subjective:    Patient ID: Shane [name], male    DOB: 10/25/1970, 42 y.o.   MRN: [id number]  HPIf/u ADD Doing very well 15mg  am and 3pm Helps kids w/ homework Less irritability Much impr work No side eff  See last OV--was corneal ulcer-req intensive rx    Review of Systems noncontr    Objective:   Physical Exam  Nursing note and vitals reviewed. Constitutional: He is oriented to person, place, and time. He appears well-developed and well-nourished. No distress.  HENT:  Head: Normocephalic and atraumatic.  Eyes: Pupils are equal, round, and reactive to light.  Neck: Normal range of motion.  Pulmonary/Chest: Effort normal. No respiratory distress.  Musculoskeletal: Normal range of motion.  Neurological: He is alert and oriented to person, place, and time.  Skin: Skin is warm and dry.  Psychiatric: He has a normal mood and affect. His behavior is normal.   BP 120/78  Pulse 88  Temp(Src) 99 F (37.2 C) (Oral)  Resp 20  Ht 6' 0.75" (1.848 m)  Wt 189 lb 12.8 oz (86.093 kg)  BMI 25.21 kg/m2  SpO2 100%        Assessment & Plan:  ADD (attention deficit disorder) - Plan: amphetamine-dextroamphetamine (ADDERALL) 15 MG tablet  Corneal ulcer, left  Meds ordered this encounter  Medications  . buPROPion (WELLBUTRIN SR) 150 MG 12 hr tablet    Sig: Take 1 tablet (150 mg total) by mouth 2 (two) times daily.    Dispense:  60 tablet    Refill:  5    Order Specific Question:  Supervising Provider    Answer:  Chalet Kerwin P [[id number]]  . amphetamine-dextroamphetamine (ADDERALL) 15 MG tablet    Sig: Take 1 tablet (15 mg total) by mouth 2 (two) times daily.    Dispense:  60 tablet    Refill:  0  . amphetamine-dextroamphetamine (ADDERALL) 15 MG tablet    Sig: Take 1 tablet (15 mg total) by mouth 2 (two) times daily. 07/25/13    Dispense:  60 tablet    Refill:  0  . amphetamine-dextroamphetamine (ADDERALL) 15 MG tablet    Sig: Take 1 tablet (15 mg total) by mouth 2  (two) times daily. 08/25/13    Dispense:  60 tablet    Refill:  0   Call in 3 f/u 6mos

## 2013-09-21 ENCOUNTER — Ambulatory Visit (INDEPENDENT_AMBULATORY_CARE_PROVIDER_SITE_OTHER): Payer: 59 | Admitting: Internal Medicine

## 2013-09-21 VITALS — BP 110/70 | HR 87 | Temp 99.2°F | Resp 16 | Ht 73.0 in | Wt 195.0 lb

## 2013-09-21 DIAGNOSIS — F988 Other specified behavioral and emotional disorders with onset usually occurring in childhood and adolescence: Secondary | ICD-10-CM

## 2013-09-21 MED ORDER — AMPHETAMINE-DEXTROAMPHETAMINE 15 MG PO TABS: 15.0000 mg | ORAL_TABLET | Freq: Two times a day (BID) | ORAL | Status: DC

## 2013-09-21 NOTE — Progress Notes (Signed)
Subjective:    Patient ID: Shane [name], male    DOB: [birth year], 42 y.o.   MRN: [id number] This chart was scribed for Shane Sia, MD by Clydene Laming, ED Scribe. This patient was seen in room 4 and the patient's care was started at 3:32 PM. HPI HPI Comments: Shane [name] is a 42 y.o. male who presents [facility] the [facility] complaining of a medication refill. Pt states his medicine is helping and his whole family can agree. He has been able to do everything he needs to do. Pt is still taking Wellbutrin Pt states his Adderall does not need to be adjusted-working well without side effects.    Patient Active Problem List   Diagnosis Date Noted  . Corneal ulcer-9/14 06/26/2013  . ADD (attention deficit disorder) 04/07/2013  . High cholesterol 12/17/2012   Past Medical History  Diagnosis Date  . ADD (attention deficit disorder)   . Depression   . Substance abuse     alcohol; none since 12/11/2012   History reviewed. No pertinent past surgical history. No Known Allergies Prior to Admission medications   Medication Sig Start Date End Date Taking? Authorizing Provider  amphetamine-dextroamphetamine (ADDERALL) 15 MG tablet Take 1 tablet (15 mg total) by mouth 2 (two) times daily. 09/21/13  Yes Tonye Pearson, MD  amphetamine-dextroamphetamine (ADDERALL) 15 MG tablet Take 1 tablet (15 mg total) by mouth 2 (two) times daily. 07/25/13 09/21/13  Yes Tonye Pearson, MD  amphetamine-dextroamphetamine (ADDERALL) 15 MG tablet Take 1 tablet (15 mg total) by mouth 2 (two) times daily. 08/25/13 09/21/13  Yes Tonye Pearson, MD  buPROPion [facility] SR) 150 MG 12 hr tablet Take 1 tablet (150 mg total) by mouth 2 (two) times daily. 06/26/13  Yes Tonye Pearson, MD   History   Social History  . Marital Status: Married    Spouse Name: Jeramiah Mccaughey    Number of Children: 2  . Years of Education: 14   Occupational History  . foam fabricator    Social  History Main Topics  . Smoking status: Never Smoker   . Smokeless tobacco: Never Used  . Alcohol Use: No     Comment: no alcohol since 12/11/2012  . Drug Use: No  . Sexual Activity: Yes    Partners: Female   Other Topics Concern  . Not on file   Social History Narrative   Lives with his wife and their two daughters      Review of Systems  Psychiatric/Behavioral: Negative for decreased concentration. The patient is not nervous/anxious.        Objective:   Physical Exam  Nursing note and vitals reviewed. Constitutional: He is oriented to person, place, and time. He appears well-developed and well-nourished. No distress.  HENT:  Head: Normocephalic.  Eyes: EOM are normal. Pupils are equal, round, and reactive to light.  Cardiovascular: Normal rate.   Pulmonary/Chest: Effort normal.  Neurological: He is alert and oriented to person, place, and time. No cranial nerve deficit.  Psychiatric: He has a normal mood and affect. His behavior is normal. Judgment and thought content normal.   Filed Vitals:   09/21/13 1456  BP: 110/70  Pulse: 87  Temp: 99.2 F (37.3 C)  TempSrc: Oral  Resp: 16  Height: 6\' 1"  (1.854 m)  Weight: 195 lb (88.451 kg)  SpO2: 99%         Assessment & Plan:  3:40 PM- Discussed treatment plan with pt at bedside. Pt  verbalized understanding and agreement with plan.  I personally performed the services described in this documentation, which was scribed in my presence. The recorded information has been reviewed and is accurate.   ADD (attention deficit disorder) - Plan: amphetamine-dextroamphetamine (ADDERALL) 15 MG tablet, amphetamine-dextroamphetamine (ADDERALL) 15 MG tablet, amphetamine-dextroamphetamine (ADDERALL) 15 MG tablet, DISCONTINUED: amphetamine-dextroamphetamine (ADDERALL) 15 MG tablet, DISCONTINUED: amphetamine-dextroamphetamine (ADDERALL) 15 MG tablet   Meds ordered this encounter  Medications  . amphetamine-dextroamphetamine  (ADDERALL) 15 MG tablet    Sig: Take 1 tablet (15 mg total) by mouth 2 (two) times daily.    Dispense:  60 tablet    Refill:  0  . amphetamine-dextroamphetamine (ADDERALL) 15 MG tablet    Sig: Take 1 tablet (15 mg total) by mouth 2 (two) times daily. For 30 d after date signed    Dispense:  60 tablet    Refill:  0  . amphetamine-dextroamphetamine (ADDERALL) 15 MG tablet    Sig: Take 1 tablet (15 mg total) by mouth 2 (two) times daily. For 60 days after date signed    Dispense:  60 tablet    Refill:  0   Call 3/ f-u 6

## 2013-11-20 ENCOUNTER — Encounter: Payer: Self-pay | Admitting: Emergency Medicine

## 2013-11-20 ENCOUNTER — Ambulatory Visit (INDEPENDENT_AMBULATORY_CARE_PROVIDER_SITE_OTHER): Payer: 59 | Admitting: Emergency Medicine

## 2013-11-20 VITALS — BP 120/70 | HR 93 | Temp 98.6°F | Resp 16 | Ht 74.0 in | Wt 204.2 lb

## 2013-11-20 DIAGNOSIS — M722 Plantar fascial fibromatosis: Secondary | ICD-10-CM

## 2013-11-20 MED ORDER — ACETAMINOPHEN-CODEINE #3 300-30 MG PO TABS
1.0000 | ORAL_TABLET | ORAL | Status: DC | PRN
Start: 2013-11-20 — End: 2013-12-18

## 2013-11-20 MED ORDER — NAPROXEN SODIUM 550 MG PO TABS: 550.0000 mg | ORAL_TABLET | Freq: Two times a day (BID) | ORAL | Status: DC

## 2013-11-20 NOTE — Patient Instructions (Signed)

## 2013-11-20 NOTE — Progress Notes (Signed)
[facility] [address], [place] [id number] [phone]  Date:  11/20/2013   Name:  Shane [name]   DOB:  [birth year]   MRN:  [id number]  PCP:  No primary provider on file.    Chief Complaint: Foot Pain   History of Present Illness:  Shane [name] is a 43 y.o. very pleasant male patient who presents with the following:  History of prior plantar fasciitis.  Has sudden pain in right heel on plantar surface of foot this morning after wearing snow boots.  No history of injury or overuse.  Uses a tennis ball to roll under his foot with no relief.  No improvement with over the counter medications or other home remedies. Denies other complaint or health concern today.   Patient Active Problem List   Diagnosis Date Noted  . Corneal ulcer-9/14 06/26/2013  . ADD (attention deficit disorder) 04/07/2013  . High cholesterol 12/17/2012    Past Medical History  Diagnosis Date  . ADD (attention deficit disorder)   . Depression   . Substance abuse     alcohol; none since 12/11/2012    No past surgical history on file.  History  Substance Use Topics  . Smoking status: Never Smoker   . Smokeless tobacco: Never Used  . Alcohol Use: No     Comment: no alcohol since 12/11/2012    Family History  Problem Relation Age of Onset  . Cancer Father     No Known Allergies  Medication list has been reviewed and updated.  Current Outpatient Prescriptions on File Prior to Visit  Medication Sig Dispense Refill  . amphetamine-dextroamphetamine (ADDERALL) 15 MG tablet Take 1 tablet (15 mg total) by mouth 2 (two) times daily.  60 tablet  0  . amphetamine-dextroamphetamine (ADDERALL) 15 MG tablet Take 1 tablet (15 mg total) by mouth 2 (two) times daily. For 30 d after date signed  60 tablet  0  . amphetamine-dextroamphetamine (ADDERALL) 15 MG tablet Take 1 tablet (15 mg total) by mouth 2 (two) times daily. For 60 days after date signed  60 tablet  0  . buPROPion  (WELLBUTRIN SR) 150 MG 12 hr tablet Take 1 tablet (150 mg total) by mouth 2 (two) times daily.  60 tablet  5   No current facility-administered medications on file prior to visit.    Review of Systems:  As per HPI, otherwise negative.    Physical Examination: Filed Vitals:   11/20/13 1553  BP: 120/70  Pulse: 93  Temp: 98.6 F (37 C)  Resp: 16   Filed Vitals:   11/20/13 1553  Height: 6\' 2"  (1.88 m)  Weight: 204 lb 3.2 oz (92.625 kg)   Body mass index is 26.21 kg/(m^2). Ideal Body Weight: Weight in (lb) to have BMI = 25: 194.3   GEN: WDWN, NAD, Non-toxic, Alert & Oriented x 3 HEENT: Atraumatic, Normocephalic.  Ears and Nose: No external deformity. EXTR: No clubbing/cyanosis/edema NEURO: Normal gait.  PSYCH: Normally interactive. Conversant. Not depressed or anxious appearing.  Calm demeanor.  RIGHT foot:  Tender, exquisitely, anterior to heel on plantar surface.  No ecchymosis or deformity   Assessment and Plan: Plantar fasciitis Anaprox Tylenol #3  Signed,  Phillips OdorJeffery Natesha Hassey, MD

## 2013-11-22 NOTE — Addendum Note (Signed)
Addended by: Carollee LeitzMCFARLAND, Saesha Llerenas L on: 11/22/2013 05:11 PM   Modules accepted: Orders

## 2013-12-18 ENCOUNTER — Ambulatory Visit (INDEPENDENT_AMBULATORY_CARE_PROVIDER_SITE_OTHER): Payer: 59 | Admitting: Physician Assistant

## 2013-12-18 VITALS — BP 122/88 | HR 93 | Temp 98.7°F | Resp 16 | Ht 74.0 in | Wt 200.2 lb

## 2013-12-18 DIAGNOSIS — F411 Generalized anxiety disorder: Secondary | ICD-10-CM

## 2013-12-18 DIAGNOSIS — F988 Other specified behavioral and emotional disorders with onset usually occurring in childhood and adolescence: Secondary | ICD-10-CM

## 2013-12-18 DIAGNOSIS — F1011 Alcohol abuse, in remission: Secondary | ICD-10-CM | POA: Insufficient documentation

## 2013-12-18 DIAGNOSIS — F419 Anxiety disorder, unspecified: Secondary | ICD-10-CM

## 2013-12-18 MED ORDER — AMPHETAMINE-DEXTROAMPHETAMINE 15 MG PO TABS: 15.0000 mg | ORAL_TABLET | Freq: Two times a day (BID) | ORAL | Status: DC

## 2013-12-18 MED ORDER — BUPROPION HCL ER (XL) 300 MG PO TB24: 300.0000 mg | ORAL_TABLET | Freq: Every day | ORAL | Status: DC

## 2013-12-18 NOTE — Progress Notes (Signed)
   Subjective:    Patient ID: Shane ChafeMichael S Couvillon, male    DOB: 30-Jun-1971, 43 y.o.   MRN: [id number]  HPI Pt presents to clinic for med refill.  He has been stable on the Wellbutrin for about a year. He has been clean for 1 year without any ETOH.  He would like to change to the Wellbutrin that is once a day dosing for ease.  He is happy with his results of Adderall use.  He takes it bid - the am dose helps him focus at work and then afternoon dose is mainly to help his kids with their homework and get dinner ready when his wife is working.  He 2-3x/week works at [employer] and he finds he has more trouble on those days because he waits to take his am dose until he has been at work for a little but and then spreads out his afternoon dose to later (5am and 12ish).  He is able to get by with the kids at night.  He takes then on the weekends because of his attention with his family and getting things done in a timely matter.  Review of Systems     Objective:   Physical Exam  Vitals reviewed. Constitutional: He is oriented to person, place, and time. He appears well-developed and well-nourished.  HENT:  Head: Normocephalic and atraumatic.  Right Ear: External ear normal.  Left Ear: External ear normal.  Pulmonary/Chest: Effort normal.  Neurological: He is alert and oriented to person, place, and time.  Skin: Skin is warm and dry.  Psychiatric: He has a normal mood and affect. His behavior is normal. Judgment and thought content normal.       Assessment & Plan:  ADD (attention deficit disorder) -Continue current dose - we will monitor his early am shifts and if they become more frequent we may need to adjust his medication.  He will call monthly for his refills.  I will talk with Dr Merla Richesoolittle about whether he needs a 3 month or 6 month recheck Plan: amphetamine-dextroamphetamine (ADDERALL) 15 MG tablet  Anxiety - Plan: buPROPion (WELLBUTRIN XL) 300 MG 24 hr tablet    Benny LennertSarah Weber PA-C  [facility] 12/18/2013 1:27 PM

## 2014-01-16 ENCOUNTER — Telehealth: Payer: Self-pay | Admitting: Radiology

## 2014-01-16 DIAGNOSIS — F988 Other specified behavioral and emotional disorders with onset usually occurring in childhood and adolescence: Secondary | ICD-10-CM

## 2014-01-16 NOTE — Telephone Encounter (Signed)
Patient needs his adderall 15mg  1 tablet BID refilled

## 2014-01-17 MED ORDER — AMPHETAMINE-DEXTROAMPHETAMINE 15 MG PO TABS: 15.0000 mg | ORAL_TABLET | Freq: Two times a day (BID) | ORAL | Status: DC

## 2014-01-17 NOTE — Telephone Encounter (Signed)
Done

## 2014-02-12 ENCOUNTER — Other Ambulatory Visit: Payer: Self-pay | Admitting: *Deleted

## 2014-02-12 DIAGNOSIS — F988 Other specified behavioral and emotional disorders with onset usually occurring in childhood and adolescence: Secondary | ICD-10-CM

## 2014-02-12 MED ORDER — AMPHETAMINE-DEXTROAMPHETAMINE 15 MG PO TABS: 15.0000 mg | ORAL_TABLET | Freq: Two times a day (BID) | ORAL | Status: DC

## 2014-02-12 NOTE — Telephone Encounter (Signed)
Pt requesting Adderall refill.

## 2014-02-12 NOTE — Telephone Encounter (Signed)
Ready

## 2014-03-12 ENCOUNTER — Other Ambulatory Visit: Payer: Self-pay | Admitting: Physician Assistant

## 2014-03-12 DIAGNOSIS — F988 Other specified behavioral and emotional disorders with onset usually occurring in childhood and adolescence: Secondary | ICD-10-CM

## 2014-03-12 MED ORDER — AMPHETAMINE-DEXTROAMPHETAMINE 15 MG PO TABS: 15.0000 mg | ORAL_TABLET | Freq: Two times a day (BID) | ORAL | Status: DC

## 2014-04-16 ENCOUNTER — Ambulatory Visit (INDEPENDENT_AMBULATORY_CARE_PROVIDER_SITE_OTHER): Payer: 59 | Admitting: Internal Medicine

## 2014-04-16 VITALS — BP 124/84 | HR 105 | Temp 98.3°F | Resp 16 | Ht 73.0 in | Wt 200.4 lb

## 2014-04-16 DIAGNOSIS — F419 Anxiety disorder, unspecified: Secondary | ICD-10-CM

## 2014-04-16 DIAGNOSIS — F411 Generalized anxiety disorder: Secondary | ICD-10-CM

## 2014-04-16 DIAGNOSIS — F988 Other specified behavioral and emotional disorders with onset usually occurring in childhood and adolescence: Secondary | ICD-10-CM

## 2014-04-16 DIAGNOSIS — J019 Acute sinusitis, unspecified: Secondary | ICD-10-CM

## 2014-04-16 MED ORDER — HYDROCODONE-HOMATROPINE 5-1.5 MG/5ML PO SYRP: 5.0000 mL | ORAL_SOLUTION | Freq: Four times a day (QID) | ORAL | Status: DC | PRN

## 2014-04-16 MED ORDER — AMPHETAMINE-DEXTROAMPHETAMINE 15 MG PO TABS: 15.0000 mg | ORAL_TABLET | Freq: Two times a day (BID) | ORAL | Status: DC

## 2014-04-16 MED ORDER — BUPROPION HCL ER (XL) 300 MG PO TB24: 300.0000 mg | ORAL_TABLET | Freq: Every day | ORAL | Status: DC

## 2014-04-16 MED ORDER — AMOXICILLIN 875 MG PO TABS: 875.0000 mg | ORAL_TABLET | Freq: Two times a day (BID) | ORAL | Status: DC

## 2014-04-16 NOTE — Progress Notes (Signed)
This chart was scribed for [name], MD by Ardelia Mems, Scribe. This patient was seen in room 4 and the patient's care was started at 9:00 AM.  Subjective:    Patient ID: Shane [name], male    DOB: April 28, 1971, 43 y.o.   MRN: [id number]  Chief Complaint  Patient presents with  . Sinus Problem    green nasal discharge for the past day and a half, sore throat, hard coughing, and having problems sleeping at night. Pressure in head.   . Medication Refill    Adderall refill    HPI  HPI Comments: Shane [name] is a 43 y.o. male who presents to [facility] complaining of green nasal discharge over the past 24 hours. He reports an associated cough, congestion, postnasal drip and sore throat. He states that he was not able to sleep well last night due to his symptoms. He states that drinking hot coffee this morning soothed his symptoms. He states that he has been clearing his throat frequently for the past week.  He is also requesting a refill of his Adderall today. He states that his Adderall has been working very well for him. He states that he occasionally misses a dose of Adderall over the weekend. He denies any insomnia, headaches, palpitations or any other side effects from the Adderall. He reports that the medication is helping him at home and at work. He reports that his daughter just successfully completed a surgery.    Review of Systems  HENT: Positive for congestion, postnasal drip, rhinorrhea and sore throat.   Respiratory: Positive for cough.   Cardiovascular: Negative for palpitations.  Neurological: Negative for headaches.  Psychiatric/Behavioral: Positive for sleep disturbance.       Objective:   Physical Exam  Nursing note and vitals reviewed. Constitutional: He is oriented to person, place, and time. He appears well-developed and well-nourished. No distress.  HENT:  Head: Normocephalic and atraumatic.  Right Ear: Tympanic membrane  normal.  Left Ear: Tympanic membrane normal.  Purulent discharge present in the nose. Throat is slightly erythematous without exudate.  Eyes: EOM are normal.  Conjunctiva are injected  Neck: Neck supple.  Cardiovascular: Normal rate.   Pulmonary/Chest: Effort normal and breath sounds normal. No respiratory distress. He has no wheezes. He has no rales.  Lungs CTA bilaterally  Musculoskeletal: Normal range of motion.  Lymphadenopathy:    He has no cervical adenopathy.  Neurological: He is alert and oriented to person, place, and time.  Skin: Skin is warm and dry.  Psychiatric: He has a normal mood and affect. His behavior is normal.      BP 124/84  Pulse 105  Temp(Src) 98.3 F (36.8 C) (Oral)  Resp 16  Ht 6\' 1"  (1.854 m)  Wt 200 lb 6.4 oz (90.901 kg)  BMI 26.45 kg/m2  SpO2 98% Assessment & Plan:  ADD (attention deficit disorder) - Plan: amphetamine-dextroamphetamine (ADDERALL) 15 MG tablet, amphetamine-dextroamphetamine (ADDERALL) 15 MG tablet, amphetamine-dextroamphetamine (ADDERALL) 15 MG tablet  Anxiety - Plan: buPROPion (WELLBUTRIN XL) 300 MG 24 hr tablet  Acute sinusitis, recurrence not specified, unspecified location  Meds ordered this encounter  Medications  . amphetamine-dextroamphetamine (ADDERALL) 15 MG tablet    Sig: Take 1 tablet (15 mg total) by mouth 2 (two) times daily.    Dispense:  60 tablet    Refill:  0    Order Specific Question:  Supervising Provider    Answer:  DOOLITTLE, ROBERT P [[id number]]  .  amphetamine-dextroamphetamine (ADDERALL) 15 MG tablet    Sig: Take 1 tablet (15 mg total) by mouth 2 (two) times daily.    Dispense:  60 tablet    Refill:  0    Order Specific Question:  Supervising Provider    Answer:  DOOLITTLE, ROBERT P [[id number]]  . amphetamine-dextroamphetamine (ADDERALL) 15 MG tablet    Sig: Take 1 tablet (15 mg total) by mouth 2 (two) times daily. For 30 d after date signed    Dispense:  60 tablet    Refill:  0  . buPROPion (WELLBUTRIN XL)  300 MG 24 hr tablet    Sig: Take 1 tablet (300 mg total) by mouth daily.    Dispense:  30 tablet    Refill:  5    Order Specific Question:  Supervising Provider    Answer:  DOOLITTLE, ROBERT P [[id number]]  . amoxicillin (AMOXIL) 875 MG tablet    Sig: Take 1 tablet (875 mg total) by mouth 2 (two) times daily.    Dispense:  20 tablet    Refill:  0  . HYDROcodone-homatropine (HYCODAN) 5-1.5 MG/5ML syrup    Sig: Take 5 mLs by mouth every 6 (six) hours as needed.    Dispense:  120 mL    Refill:  0     I have completed the patient encounter in its entirety as documented by the scribe, with editing by me where necessary. Robert P. Merla Richesoolittle, M.D.

## 2014-04-26 ENCOUNTER — Telehealth: Payer: Self-pay | Admitting: *Deleted

## 2014-04-26 MED ORDER — CEFDINIR 300 MG PO CAPS: 600.0000 mg | ORAL_CAPSULE | Freq: Every day | ORAL | Status: DC

## 2014-04-26 NOTE — Telephone Encounter (Signed)
Pt saw Dr. Merla Richesoolittle on 7/14. Wife called and pt is not doing any better and needs another round of abx. He has finished the abx and needs additional medication. Nasal congestion and drainage is yellow/brown now.  Please advise if abx can be sent in.

## 2014-04-26 NOTE — Telephone Encounter (Signed)
Meds ordered this encounter  Medications  . cefdinir (OMNICEF) 300 MG capsule    Sig: Take 2 capsules (600 mg total) by mouth daily.    Dispense:  20 capsule    Refill:  0    Order Specific Question:  Supervising Provider    Answer:  DOOLITTLE, ROBERT P [[id number]]    If symptoms persist, RTC.

## 2014-04-27 NOTE — Telephone Encounter (Signed)
Patient notified and voiced understanding.

## 2014-05-06 ENCOUNTER — Other Ambulatory Visit: Payer: Self-pay | Admitting: Physician Assistant

## 2014-05-06 DIAGNOSIS — F419 Anxiety disorder, unspecified: Secondary | ICD-10-CM

## 2014-05-06 MED ORDER — BUPROPION HCL ER (XL) 300 MG PO TB24: 300.0000 mg | ORAL_TABLET | Freq: Every day | ORAL | Status: DC

## 2014-07-13 ENCOUNTER — Ambulatory Visit (INDEPENDENT_AMBULATORY_CARE_PROVIDER_SITE_OTHER): Payer: 59 | Admitting: Internal Medicine

## 2014-07-13 VITALS — BP 124/84 | HR 80 | Temp 98.3°F | Resp 16 | Ht 74.0 in | Wt 200.4 lb

## 2014-07-13 DIAGNOSIS — R059 Cough, unspecified: Secondary | ICD-10-CM

## 2014-07-13 DIAGNOSIS — J9801 Acute bronchospasm: Secondary | ICD-10-CM

## 2014-07-13 DIAGNOSIS — R05 Cough: Secondary | ICD-10-CM

## 2014-07-13 DIAGNOSIS — F988 Other specified behavioral and emotional disorders with onset usually occurring in childhood and adolescence: Secondary | ICD-10-CM

## 2014-07-13 DIAGNOSIS — F909 Attention-deficit hyperactivity disorder, unspecified type: Secondary | ICD-10-CM

## 2014-07-13 MED ORDER — AMPHETAMINE-DEXTROAMPHETAMINE 15 MG PO TABS: 15.0000 mg | ORAL_TABLET | Freq: Two times a day (BID) | ORAL | Status: DC

## 2014-07-13 NOTE — Progress Notes (Signed)
Subjective:    Patient ID: Shane ChafeMichael S [name], male    DOB: April 28, 1971, 43 y.o.   MRN: [id number]  Cough Pertinent negatives include no chills or fever.   Chief Complaint  Patient presents with  . Medication Refill    adderall  . Cough    going for 3 months--dry cough   This chart was scribed for Ellamae Siaobert Doolittle, MD by Andrew Auaven Small, ED Scribe. This patient was seen in room 12 and the patient's care was started at 11:25 AM.  HPI Comments: Shane ChafeMichael S Alperin is a 43 y.o. male who presents to the [facility] complaining of non productive, dry cough that began 3 months ago. Pt was seen 3 months ago with a cough that he reports had gotten better, returned and now has progressively worsened. He reports coughing when becoming over heated while working but when at home he has a tickle in his throat causing coughing spells. He reports worsening cough during the night that interferes with sleep. Pt states at time during coughing spells he has wheezes. Pt reports mild acid secondary to cough worse during the night and has taken zantac 150 for 4-5 days. He denies allergies. He denies asthma. He denies being a smoker.   Pt is requesting a medication refill adderall. Doing well w/out side effects  Past Medical History  Diagnosis Date  . ADD (attention deficit disorder)   . Depression   . Substance abuse     alcohol; none since 12/11/2012   History reviewed. No pertinent past surgical history. Prior to Admission medications   Medication Sig Start Date End Date Taking? Authorizing Provider  amphetamine-dextroamphetamine (ADDERALL) 15 MG tablet Take 1 tablet (15 mg total) by mouth 2 (two) times daily. 04/16/14  Yes Tonye Pearsonobert P Doolittle, MD  amphetamine-dextroamphetamine (ADDERALL) 15 MG tablet Take 1 tablet (15 mg total) by mouth 2 (two) times daily. 04/16/14  Yes Tonye Pearsonobert P Doolittle, MD  amphetamine-dextroamphetamine (ADDERALL) 15 MG tablet Take 1 tablet (15 mg total) by mouth 2 (two)  times daily. For 30 d after date signed 04/16/14  Yes Tonye Pearsonobert P Doolittle, MD  buPROPion (WELLBUTRIN XL) 300 MG 24 hr tablet Take 1 tablet (300 mg total) by mouth daily. 05/06/14  Yes Morrell RiddleSarah L Weber, PA-C  cefdinir (OMNICEF) 300 MG capsule Take 2 capsules (600 mg total) by mouth daily. 04/26/14  Yes Chelle S Jeffery, PA-C  HYDROcodone-homatropine (HYCODAN) 5-1.5 MG/5ML syrup Take 5 mLs by mouth every 6 (six) hours as needed. 04/16/14  Yes Tonye Pearsonobert P Doolittle, MD   Review of Systems  Constitutional: Negative for fever and chills.  Respiratory: Positive for cough.   no wt loss or night sweats  Objective:   Physical Exam  Nursing note and vitals reviewed. Constitutional: He is oriented to person, place, and time. He appears well-developed and well-nourished. No distress.  HENT:  Head: Normocephalic and atraumatic.  Right Ear: External ear normal.  Left Ear: External ear normal.  Nose: Nose normal.  Mouth/Throat: Oropharynx is clear and moist.  Eyes: Conjunctivae and EOM are normal. Pupils are equal, round, and reactive to light.  Neck: Neck supple.  Cardiovascular: Normal rate, regular rhythm and normal heart sounds.  Exam reveals no gallop and no friction rub.   No murmur heard. Pulmonary/Chest: Effort normal and breath sounds normal. No respiratory distress. He has no wheezes. He has no rales. He exhibits no tenderness.  Musculoskeletal: Normal range of motion.  Neurological: He is alert and oriented to person, place,  and time.  Skin: Skin is warm and dry.  Psychiatric: He has a normal mood and affect. His behavior is normal.   Assessment & Plan:   1. ADD (attention deficit disorder)   2. Cough   3. Post-infection bronchospasm    Meds ordered this encounter  Medications  . amphetamine-dextroamphetamine (ADDERALL) 15 MG tablet    Sig: Take 1 tablet by mouth 2 (two) times daily.    Dispense:  60 tablet    Refill:  0  . amphetamine-dextroamphetamine (ADDERALL) 15 MG tablet    Sig: Take  1 tablet by mouth 2 (two) times daily.    Dispense:  60 tablet    Refill:  0  . amphetamine-dextroamphetamine (ADDERALL) 15 MG tablet    Sig: Take 1 tablet by mouth 2 (two) times daily. For 30 d after date signed    Dispense:  60 tablet    Refill:  0   dulera i inh bid 2 mos--samples Zantac 10days then quit F/u if not resolved

## 2014-09-07 ENCOUNTER — Other Ambulatory Visit: Payer: Self-pay | Admitting: Physician Assistant

## 2014-09-07 DIAGNOSIS — F419 Anxiety disorder, unspecified: Secondary | ICD-10-CM

## 2014-09-07 MED ORDER — BUPROPION HCL ER (XL) 300 MG PO TB24: 300.0000 mg | ORAL_TABLET | Freq: Every day | ORAL | Status: DC

## 2015-03-17 ENCOUNTER — Other Ambulatory Visit: Payer: Self-pay | Admitting: Family Medicine

## 2015-03-17 DIAGNOSIS — M791 Myalgia, unspecified site: Secondary | ICD-10-CM | POA: Insufficient documentation

## 2015-03-17 DIAGNOSIS — M7662 Achilles tendinitis, left leg: Secondary | ICD-10-CM

## 2015-03-17 DIAGNOSIS — M7661 Achilles tendinitis, right leg: Secondary | ICD-10-CM | POA: Insufficient documentation

## 2015-03-17 DIAGNOSIS — G479 Sleep disorder, unspecified: Secondary | ICD-10-CM

## 2015-03-17 MED ORDER — CYCLOBENZAPRINE HCL 10 MG PO TABS: 10.0000 mg | ORAL_TABLET | Freq: Every day | ORAL | Status: DC

## 2015-03-17 NOTE — Telephone Encounter (Signed)
Done

## 2015-03-17 NOTE — Telephone Encounter (Signed)
Patient needs refill on flexeril 10 mg  to be sent to [facility] outpatient pharmacy.

## 2015-04-21 ENCOUNTER — Encounter: Payer: Self-pay | Admitting: Family Medicine

## 2015-04-21 ENCOUNTER — Ambulatory Visit (INDEPENDENT_AMBULATORY_CARE_PROVIDER_SITE_OTHER): Payer: 59 | Admitting: Family Medicine

## 2015-04-21 VITALS — BP 118/86 | HR 107 | Temp 98.4°F | Resp 16 | Ht 74.0 in | Wt 209.2 lb

## 2015-04-21 DIAGNOSIS — F988 Other specified behavioral and emotional disorders with onset usually occurring in childhood and adolescence: Secondary | ICD-10-CM

## 2015-04-21 DIAGNOSIS — F909 Attention-deficit hyperactivity disorder, unspecified type: Secondary | ICD-10-CM

## 2015-04-21 MED ORDER — AMPHETAMINE-DEXTROAMPHETAMINE 20 MG PO TABS: 20.0000 mg | ORAL_TABLET | Freq: Two times a day (BID) | ORAL | Status: DC

## 2015-04-21 NOTE — Progress Notes (Signed)
Name: Shane [name]   MRN: [id number]    DOB: 03-Mar-1971   Date:04/21/2015       Progress Note  Subjective  Chief Complaint  Chief Complaint  Patient presents with  . ADD  . Medication Refill    patient is here for his 41-month follow-up. patient states things are going well.    HPI  Attention Deficit Hyperactivity Disorder follow up: Earsel has a lifelong history of increased motor activity with additional behaviors that include disruptive behavior, impulsivity, inattention, low self-confidence and need for frequent task redirection. Beverley is reported to have a pattern of low self-esteem and troublesome relationships with family and peers in the past which have improved. Especially since he has achieved sobriety and abstinence from alcohol. Current medication regimen include Adderall 15mg  po bid. He reports more recent distractibility at work, often having to re-start his tasks if someone interrupts him. There is no reported side effects such as chest pain, headaches, palpitations, appetite changes, GI upset.   Patient Active Problem List   Diagnosis Date Noted  . Restless sleeper 03/17/2015  . Achilles tendinitis of both lower extremities 03/17/2015  . Muscle pain 03/17/2015  . Anxiety 12/18/2013  . H/O ETOH abuse 12/18/2013  . ADD (attention deficit disorder) 04/07/2013  . High cholesterol 12/17/2012    History  Substance Use Topics  . Smoking status: Never Smoker   . Smokeless tobacco: Never Used  . Alcohol Use: No     Comment: no alcohol since 12/11/2012     Current outpatient prescriptions:  .  amphetamine-dextroamphetamine (ADDERALL) 15 MG tablet, Take 1 tablet by mouth 2 (two) times daily. For 30 d after date signed, Disp: 60 tablet, Rfl: 0 .  buPROPion (WELLBUTRIN XL) 300 MG 24 hr tablet, Take 1 tablet (300 mg total) by mouth daily., Disp: 90 tablet, Rfl: 3 .  cyclobenzaprine (FLEXERIL) 10 MG tablet, Take 1 tablet (10 mg total) by mouth at bedtime., Disp: 30  tablet, Rfl: 5  History reviewed. No pertinent past surgical history.  Family History  Problem Relation Age of Onset  . Cancer Father     No Known Allergies   Review of Systems  CONSTITUTIONAL: No significant weight changes, fever, chills, weakness or fatigue.  HEENT:  - Eyes: No visual changes.  - Ears: No auditory changes. No pain.  - Nose: No sneezing, congestion, runny nose. - Throat: No sore throat. No changes in swallowing. SKIN: No rash or itching.  CARDIOVASCULAR: No chest pain, chest pressure or chest discomfort. No palpitations or edema.  RESPIRATORY: No shortness of breath, cough or sputum.  GASTROINTESTINAL: No anorexia, nausea, vomiting. No changes in bowel habits. No abdominal pain or blood.  GENITOURINARY: No dysuria. No frequency. No discharge. NEUROLOGICAL: No headache, dizziness, syncope, paralysis, ataxia, numbness or tingling in the extremities. No memory changes. No change in bowel or bladder control.  MUSCULOSKELETAL: No joint pain. No muscle pain. HEMATOLOGIC: No anemia, bleeding or bruising.  LYMPHATICS: No enlarged lymph nodes.  PSYCHIATRIC: No change in mood. No change in sleep pattern. Change in focus. ENDOCRINOLOGIC: No reports of sweating, cold or heat intolerance. No polyuria or polydipsia.   Objective  BP 118/86 mmHg  Pulse 107  Temp(Src) 98.4 F (36.9 C) (Oral)  Resp 16  Ht 6\' 2"  (1.88 m)  Wt 209 lb 3.2 oz (94.892 kg)  BMI 26.85 kg/m2  SpO2 98% Body mass index is 26.85 kg/(m^2).   Physical Exam  Constitutional: Patient appears well-developed and well-nourished. In no distress.  HEENT:  - Head: Normocephalic and atraumatic.  - Ears: Bilateral TMs gray, no erythema or effusion - Nose: Nasal mucosa moist - Mouth/Throat: Oropharynx is clear and moist. No tonsillar hypertrophy or erythema. No post nasal drainage.  - Eyes: Conjunctivae clear, EOM movements normal. PERRLA. No scleral icterus.  Neck: Normal range of motion. Neck supple.  No JVD present. No thyromegaly present.  Cardiovascular: Normal rate, regular rhythm and normal heart sounds.  No murmur heard.  Pulmonary/Chest: Effort normal and breath sounds normal. No respiratory distress. Musculoskeletal: Normal range of motion bilateral UE and LE, no joint effusions. Peripheral vascular: Bilateral LE no edema. Neurological: CN II-XII grossly intact with no focal deficits. Alert and oriented to person, place, and time. Coordination, balance, strength, speech and gait are normal.  Skin: Skin is warm and dry. No rash noted. No erythema.  Psychiatric: Patient has a stable mood and affect. Behavior is normal in office today. Judgment and thought content normal in office today.    Assessment & Plan  1. ADD (attention deficit disorder) Increased Adderall short acting from  po bid to  po bid with a 3 month supply.   The patient has been prescribed a controled substance today under the agreement of a filed pain treatment regimen contract.  With use of this medication they verbalize understanding the potential risk of addiction, abuse, and misuse, which can lead to overdose and death. The patient may not obtain and use other illicit or controled substances from any other sources while under the aformentioned contract. A urine drug screen will be performed periodically and the patient's name will be reviewed on the [place] Controlled Substance Reporting System regularly.  The patient expresses understanding the above statement and agreement to comply.  The patient has been counseled on the proper use, side effects and potential interactions of the new medication with other prescribed and OTC medications. Under no circumstances is this (and any other) medication to be use with alcohol or other illicit drugs. This medication is not to be crushed, chewed, sniffed, injected or used in any other way other than what is stated in the directions. This medication is to be used at the  frequency and quantity that is stated in the directions. This medication is to be used only by the individual who's name is on the prescription bottle. Drug sharing and selling is unacceptable. Patient voices understanding what has been said today.   - amphetamine-dextroamphetamine (ADDERALL) 20 MG tablet; Take 1 tablet (20 mg total) by mouth 2 (two) times daily.  Dispense: 60 tablet; Refill: 0 - amphetamine-dextroamphetamine (ADDERALL) 20 MG tablet; Take 1 tablet (20 mg total) by mouth 2 (two) times daily.  Dispense: 60 tablet; Refill: 0 - amphetamine-dextroamphetamine (ADDERALL) 20 MG tablet; Take 1 tablet (20 mg total) by mouth 2 (two) times daily.  Dispense: 60 tablet; Refill: 0

## 2015-07-21 ENCOUNTER — Ambulatory Visit (INDEPENDENT_AMBULATORY_CARE_PROVIDER_SITE_OTHER): Payer: 59 | Admitting: Family Medicine

## 2015-07-21 ENCOUNTER — Encounter: Payer: Self-pay | Admitting: Family Medicine

## 2015-07-21 VITALS — BP 122/86 | HR 115 | Temp 98.4°F | Resp 18 | Wt 206.8 lb

## 2015-07-21 DIAGNOSIS — S93401A Sprain of unspecified ligament of right ankle, initial encounter: Secondary | ICD-10-CM | POA: Insufficient documentation

## 2015-07-21 DIAGNOSIS — F909 Attention-deficit hyperactivity disorder, unspecified type: Secondary | ICD-10-CM | POA: Diagnosis not present

## 2015-07-21 DIAGNOSIS — F988 Other specified behavioral and emotional disorders with onset usually occurring in childhood and adolescence: Secondary | ICD-10-CM

## 2015-07-21 DIAGNOSIS — S93401S Sprain of unspecified ligament of right ankle, sequela: Secondary | ICD-10-CM | POA: Diagnosis not present

## 2015-07-21 MED ORDER — AMPHETAMINE-DEXTROAMPHETAMINE 20 MG PO TABS: 20.0000 mg | ORAL_TABLET | Freq: Two times a day (BID) | ORAL | Status: DC

## 2015-07-21 NOTE — Progress Notes (Signed)
Name: Shane [name]   MRN: [id number]    DOB: 1970/11/15   Date:07/21/2015       Progress Note  Subjective  Chief Complaint  Chief Complaint  Patient presents with  . ADD    patient needs his Adderall  refilled  . Ankle Injury    patient stated that it is still swollen and painful. he has tried ice and elevation.    HPI  Shane [name] is a 44 year old male with a lifelong history of increased motor activity with additional behaviors that include disruptive behavior, impulsivity, inattention, low self-confidence and need for frequent task redirection. Shane [name] is reported to have a pattern of low self-esteem and troublesome relationships with family and peers in the past which have improved. Especially since he has achieved sobriety and abstinence from alcohol. Current medication regimen include Adderall  po bid. He reports controled distractibility at work. There is no reported side effects such as chest pain, headaches, palpitations, appetite changes, GI upset.   Has previously hurt his right ankle first time in high school, bad sprain but did not need surgical intervention. Ever since then his right ankle is weak. Most recently on Thursday (07/17/15) he was stepping down off a 3 foot high and his right foot landed on uneven surface and he inverted his foot suddenly and had an acute on chronic sprain. No bruising initially or the morning after but by the end of the 2nd day wearing his ankle brace and working there was more bruising. It is resolving slowly now. He is able to ambulate with no problems.  Past Medical History  Diagnosis Date  . ADD (attention deficit disorder)   . Depression   . Substance abuse     alcohol; none since 12/11/2012    Patient Active Problem List   Diagnosis Date Noted  . Restless sleeper 03/17/2015  . Achilles tendinitis of both lower extremities 03/17/2015  . Muscle pain 03/17/2015  . Anxiety 12/18/2013  . H/O ETOH abuse 12/18/2013  . ADD  (attention deficit disorder) 04/07/2013  . High cholesterol 12/17/2012    Social History  Substance Use Topics  . Smoking status: Never Smoker   . Smokeless tobacco: Never Used  . Alcohol Use: No     Comment: no alcohol since 12/11/2012     Current outpatient prescriptions:  .  amphetamine-dextroamphetamine (ADDERALL) 20 MG tablet, Take 1 tablet (20 mg total) by mouth 2 (two) times daily., Disp: 60 tablet, Rfl: 0 .  buPROPion (WELLBUTRIN XL) 300 MG 24 hr tablet, Take 1 tablet (300 mg total) by mouth daily., Disp: 90 tablet, Rfl: 3 .  cyclobenzaprine (FLEXERIL) 10 MG tablet, Take 1 tablet (10 mg total) by mouth at bedtime., Disp: 30 tablet, Rfl: 5 .  amphetamine-dextroamphetamine (ADDERALL) 20 MG tablet, Take 1 tablet (20 mg total) by mouth 2 (two) times daily., Disp: 60 tablet, Rfl: 0 .  amphetamine-dextroamphetamine (ADDERALL) 20 MG tablet, Take 1 tablet (20 mg total) by mouth 2 (two) times daily., Disp: 60 tablet, Rfl: 0  No past surgical history on file.  Family History  Problem Relation Age of Onset  . Cancer Father     No Known Allergies   Review of Systems  CONSTITUTIONAL: No significant weight changes, fever, chills, weakness or fatigue.  HEENT:  - Eyes: No visual changes.  - Ears: No auditory changes. No pain.  - Nose: No sneezing, congestion, runny nose. - Throat: No sore throat. No changes in swallowing. SKIN: No rash or itching.  CARDIOVASCULAR: No chest pain, chest pressure or chest discomfort. No palpitations or edema.  RESPIRATORY: No shortness of breath, cough or sputum.  GASTROINTESTINAL: No anorexia, nausea, vomiting. No changes in bowel habits. No abdominal pain or blood.  NEUROLOGICAL: No headache, dizziness, syncope, paralysis, ataxia, numbness or tingling in the extremities. No memory changes. No change in bowel or bladder control.  MUSCULOSKELETAL: Yes joint pain. No muscle pain. HEMATOLOGIC: Yes bruising.  PSYCHIATRIC: No change in mood. No change in  sleep pattern.     Objective  BP 122/86 mmHg  Pulse 115  Temp(Src) 98.4 F (36.9 C) (Oral)  Resp 18  Wt 206 lb 12.8 oz (93.804 kg)  SpO2 98% Body mass index is 26.54 kg/(m^2).  Physical Exam  Constitutional: Patient appears well-developed and well-nourished. In no distress.  Cardiovascular: Normal rate, regular rhythm and normal heart sounds.  No murmur heard.  Pulmonary/Chest: Effort normal and breath sounds normal. No respiratory distress. Musculoskeletal: Normal range of motion bilateral UE and LE. Right ankle lateral aspect mild swelling with bruising at base of foot around calcaneal (heal) semi circumference. Otherwise good eversion, inversion, plantar flexion and dorsi flexion. Peripheral vascular: Bilateral LE no edema. Neurological: CN II-XII grossly intact with no focal deficits. Alert and oriented to person, place, and time. Coordination, balance, strength, speech and gait are normal.  Skin: Skin is warm and dry. No rash noted. No erythema.  Psychiatric: Patient has a normal mood and affect. Behavior is normal in office today. Judgment and thought content normal in office today.   Assessment & Plan   1. Right ankle sprain, sequela Continue conservative management as he did not want to pursue an x-ray at this time since this is a chronic issue that he is familiar with and is comfortable managing at home.  2. ADD (attention deficit disorder) Stable. Refilled for 3 months.  - amphetamine-dextroamphetamine (ADDERALL) 20 MG tablet; Take 1 tablet (20 mg total) by mouth 2 (two) times daily.  Dispense: 60 tablet; Refill: 0 - amphetamine-dextroamphetamine (ADDERALL) 20 MG tablet; Take 1 tablet (20 mg total) by mouth 2 (two) times daily.  Dispense: 60 tablet; Refill: 0 - amphetamine-dextroamphetamine (ADDERALL) 20 MG tablet; Take 1 tablet (20 mg total) by mouth 2 (two) times daily.  Dispense: 60 tablet; Refill: 0

## 2015-08-26 ENCOUNTER — Ambulatory Visit (INDEPENDENT_AMBULATORY_CARE_PROVIDER_SITE_OTHER): Payer: 59 | Admitting: Emergency Medicine

## 2015-08-26 VITALS — BP 118/80 | HR 102 | Temp 98.2°F | Resp 18 | Ht 74.0 in | Wt 203.2 lb

## 2015-08-26 DIAGNOSIS — J209 Acute bronchitis, unspecified: Secondary | ICD-10-CM | POA: Diagnosis not present

## 2015-08-26 DIAGNOSIS — J0140 Acute pansinusitis, unspecified: Secondary | ICD-10-CM | POA: Diagnosis not present

## 2015-08-26 MED ORDER — AMOXICILLIN-POT CLAVULANATE 875-125 MG PO TABS: 1.0000 | ORAL_TABLET | Freq: Two times a day (BID) | ORAL | Status: DC

## 2015-08-26 MED ORDER — HYDROCOD POLST-CPM POLST ER 10-8 MG/5ML PO SUER: 5.0000 mL | Freq: Two times a day (BID) | ORAL | Status: DC

## 2015-08-26 MED ORDER — TRIAMCINOLONE ACETONIDE 55 MCG/ACT NA AERO: 2.0000 | INHALATION_SPRAY | Freq: Every day | NASAL | Status: DC

## 2015-08-26 MED ORDER — PSEUDOEPHEDRINE-GUAIFENESIN ER 60-600 MG PO TB12
1.0000 | ORAL_TABLET | Freq: Two times a day (BID) | ORAL | Status: DC
Start: 2015-08-26 — End: 2015-09-30

## 2015-08-26 NOTE — Patient Instructions (Signed)

## 2015-08-26 NOTE — Progress Notes (Signed)
Subjective:  Patient ID: Shane [name], male    DOB: 04/27/1971  Age: 44 y.o. MRN: [id number]  CC: Cough   HPI Shane [name] presents  with a several day history of nasal congestion postnasal drainage. He has a purulent nasal discharge. Has a cough productive of purulent sputum. He has no wheezing or shortness of breath. He'[name] been ill for the last 3 days and now it is worse last night. He was unable sleep due to coughing has no fever chills. In no improvement of over-the-counter medication.  History Shane [name] has a past medical history of ADD (attention deficit disorder); Depression; and Substance abuse.   He has no past surgical history on file.   His  family history includes Cancer in his father.  He   reports that he has never smoked. He has never used smokeless tobacco. He reports that he does not drink alcohol or use illicit drugs.  Outpatient Prescriptions Prior to Visit  Medication Sig Dispense Refill  . amphetamine-dextroamphetamine (ADDERALL) 20 MG tablet Take 1 tablet (20 mg total) by mouth 2 (two) times daily. 60 tablet 0  . amphetamine-dextroamphetamine (ADDERALL) 20 MG tablet Take 1 tablet (20 mg total) by mouth 2 (two) times daily. 60 tablet 0  . amphetamine-dextroamphetamine (ADDERALL) 20 MG tablet Take 1 tablet (20 mg total) by mouth 2 (two) times daily. 60 tablet 0  . buPROPion (WELLBUTRIN XL) 300 MG 24 hr tablet Take 1 tablet (300 mg total) by mouth daily. 90 tablet 3  . cyclobenzaprine (FLEXERIL) 10 MG tablet Take 1 tablet (10 mg total) by mouth at bedtime. 30 tablet 5   No facility-administered medications prior to visit.    Social History   Social History  . Marital Status: Married    Spouse Name: Shane [name]  . Number of Children: 2  . Years of Education: 14   Occupational History  . foam fabricator    Social History Main Topics  . Smoking status: Never Smoker   . Smokeless tobacco: Never Used  . Alcohol Use: No     Comment: no  alcohol since 12/11/2012  . Drug Use: No  . Sexual Activity:    Partners: Female   Other Topics Concern  . None   Social History Narrative   Lives with his wife and their two daughters     Review of Systems  Constitutional: Positive for chills and appetite change. Negative for fever.  HENT: Positive for congestion, postnasal drip, rhinorrhea, sinus pressure and sore throat. Negative for ear pain.   Eyes: Negative for pain and redness.  Respiratory: Positive for cough. Negative for shortness of breath and wheezing.   Cardiovascular: Negative for leg swelling.  Gastrointestinal: Negative for nausea, vomiting, abdominal pain, diarrhea, constipation and blood in stool.  Endocrine: Negative for polyuria.  Genitourinary: Negative for dysuria, urgency, frequency and flank pain.  Musculoskeletal: Negative for gait problem.  Skin: Negative for rash.  Neurological: Negative for weakness and headaches.  Psychiatric/Behavioral: Negative for confusion and decreased concentration. The patient is not nervous/anxious.     Objective:  BP 118/80 mmHg  Pulse 102  Temp(Src) 98.2 F (36.8 C) (Oral)  Resp 18  Ht  (1.88 m)  Wt 203 lb 3.2 oz (92.171 kg)  BMI 26.08 kg/m2  SpO2 98%  Physical Exam  Constitutional: He is oriented to person, place, and time. He appears well-developed and well-nourished. No distress.  HENT:  Head: Normocephalic and atraumatic.  Right Ear: External ear normal.  Left  Ear: External ear normal.  Nose: Nose normal.  Eyes: Conjunctivae and EOM are normal. Pupils are equal, round, and reactive to light. No scleral icterus.  Neck: Normal range of motion. Neck supple. No tracheal deviation present.  Cardiovascular: Normal rate, regular rhythm and normal heart sounds.   Pulmonary/Chest: Effort normal. No respiratory distress. He has no wheezes. He has no rales.  Abdominal: He exhibits no mass. There is no tenderness. There is no rebound and no guarding.    Musculoskeletal: He exhibits no edema.  Lymphadenopathy:    He has no cervical adenopathy.  Neurological: He is alert and oriented to person, place, and time. Coordination normal.  Skin: Skin is warm and dry. No rash noted.  Psychiatric: He has a normal mood and affect. His behavior is normal.      Assessment & Plan:   Shane NeedleMichael was seen today for cough.  Diagnoses and all orders for this visit:  Acute bronchitis, unspecified organism  Acute pansinusitis, recurrence not specified  Other orders -     amoxicillin-clavulanate (AUGMENTIN) 875-125 MG tablet; Take 1 tablet by mouth 2 (two) times daily. -     pseudoephedrine-guaifenesin (MUCINEX D) 60-600 MG 12 hr tablet; Take 1 tablet by mouth every 12 (twelve) hours. -     chlorpheniramine-HYDROcodone (TUSSIONEX PENNKINETIC ER) 10-8 MG/5ML SUER; Take 5 mLs by mouth 2 (two) times daily. -     triamcinolone (NASACORT AQ) 55 MCG/ACT AERO nasal inhaler; Place 2 sprays into the nose daily.  I am having Shane [name] start on amoxicillin-clavulanate, pseudoephedrine-guaifenesin, chlorpheniramine-HYDROcodone, and triamcinolone. I am also having him maintain his buPROPion, cyclobenzaprine, amphetamine-dextroamphetamine, amphetamine-dextroamphetamine, and amphetamine-dextroamphetamine.  Meds ordered this encounter  Medications  . amoxicillin-clavulanate (AUGMENTIN) 875-125 MG tablet    Sig: Take 1 tablet by mouth 2 (two) times daily.    Dispense:  20 tablet    Refill:  0  . pseudoephedrine-guaifenesin (MUCINEX D) 60-600 MG 12 hr tablet    Sig: Take 1 tablet by mouth every 12 (twelve) hours.    Dispense:  18 tablet    Refill:  0  . chlorpheniramine-HYDROcodone (TUSSIONEX PENNKINETIC ER) 10-8 MG/5ML SUER    Sig: Take 5 mLs by mouth 2 (two) times daily.    Dispense:  60 mL    Refill:  0  . triamcinolone (NASACORT AQ) 55 MCG/ACT AERO nasal inhaler    Sig: Place 2 sprays into the nose daily.    Dispense:  1 Inhaler    Refill:  12     Appropriate red flag conditions were discussed with the patient as well as actions that should be taken.  Patient expressed his understanding.  Follow-up: Return if symptoms worsen or fail to improve.  Carmelina DaneAnderson, Shane Bass S, MD

## 2015-09-15 ENCOUNTER — Ambulatory Visit (HOSPITAL_COMMUNITY)
Admission: EM | Admit: 2015-09-15 | Discharge: 2015-09-15 | Disposition: A | Discharge status: Home / Self Care | Payer: Worker's Compensation | Attending: Emergency Medicine | Admitting: Emergency Medicine

## 2015-09-15 ENCOUNTER — Emergency Department (HOSPITAL_COMMUNITY): Payer: Worker's Compensation | Admitting: Anesthesiology

## 2015-09-15 ENCOUNTER — Encounter (HOSPITAL_COMMUNITY)
Admission: EM | Disposition: A | Discharge status: Home / Self Care | Payer: Self-pay | Source: Home / Self Care | Attending: Emergency Medicine

## 2015-09-15 ENCOUNTER — Encounter (HOSPITAL_COMMUNITY): Payer: Self-pay | Admitting: Family Medicine

## 2015-09-15 ENCOUNTER — Emergency Department (HOSPITAL_COMMUNITY): Payer: Worker's Compensation

## 2015-09-15 DIAGNOSIS — S56521A Laceration of other extensor muscle, fascia and tendon at forearm level, right arm, initial encounter: Secondary | ICD-10-CM | POA: Diagnosis not present

## 2015-09-15 DIAGNOSIS — S51811A Laceration without foreign body of right forearm, initial encounter: Secondary | ICD-10-CM | POA: Insufficient documentation

## 2015-09-15 DIAGNOSIS — F909 Attention-deficit hyperactivity disorder, unspecified type: Secondary | ICD-10-CM | POA: Insufficient documentation

## 2015-09-15 DIAGNOSIS — S41111A Laceration without foreign body of right upper arm, initial encounter: Secondary | ICD-10-CM

## 2015-09-15 DIAGNOSIS — F329 Major depressive disorder, single episode, unspecified: Secondary | ICD-10-CM | POA: Diagnosis not present

## 2015-09-15 HISTORY — PX: TENDON REPAIR: SHX5111

## 2015-09-15 LAB — COMPREHENSIVE METABOLIC PANEL
ALBUMIN: 4 g/dL (ref 3.5–5.0)
ALK PHOS: 103 U/L (ref 38–126)
ALT: 36 U/L (ref 17–63)
AST: 26 U/L (ref 15–41)
Anion gap: 8 (ref 5–15)
BUN: 11 mg/dL (ref 6–20)
CHLORIDE: 104 mmol/L (ref 101–111)
CO2: 25 mmol/L (ref 22–32)
CREATININE: 1.03 mg/dL (ref 0.61–1.24)
Calcium: 9.3 mg/dL (ref 8.9–10.3)
GFR calc Af Amer: >60 mL/min (ref >60)
GFR calc non Af Amer: >60 mL/min (ref >60)
GLUCOSE: 109 mg/dL — AB (ref 65–99)
Potassium: 4.1 mmol/L (ref 3.5–5.1)
SODIUM: 137 mmol/L (ref 135–145)
Total Bilirubin: 1.1 mg/dL (ref 0.3–1.2)
Total Protein: 7 g/dL (ref 6.5–8.1)

## 2015-09-15 LAB — CBC WITH DIFFERENTIAL/PLATELET
BASOS ABS: 0 10*3/uL (ref 0.0–0.1)
Basophils Relative: 0 %
EOS ABS: 0.2 10*3/uL (ref 0.0–0.7)
EOS PCT: 3 %
HCT: 47 % (ref 39.0–52.0)
HEMOGLOBIN: 16.2 g/dL (ref 13.0–17.0)
Lymphocytes Relative: 25 %
Lymphs Abs: 1.6 10*3/uL (ref 0.7–4.0)
MCH: 31.6 pg (ref 26.0–34.0)
MCHC: 34.5 g/dL (ref 30.0–36.0)
MCV: 91.8 fL (ref 78.0–100.0)
Monocytes Absolute: 0.4 10*3/uL (ref 0.1–1.0)
Monocytes Relative: 7 %
NEUTROS PCT: 65 %
Neutro Abs: 4 10*3/uL (ref 1.7–7.7)
PLATELETS: 275 10*3/uL (ref 150–400)
RBC: 5.12 MIL/uL (ref 4.22–5.81)
RDW: 12.8 % (ref 11.5–15.5)
WBC: 6.1 10*3/uL (ref 4.0–10.5)

## 2015-09-15 LAB — PROTIME-INR
INR: 1.01 (ref 0.00–1.49)
Prothrombin Time: 13.5 seconds (ref 11.6–15.2)

## 2015-09-15 LAB — APTT: aPTT: 28 seconds (ref 24–37)

## 2015-09-15 SURGERY — TENDON REPAIR
Anesthesia: General | Site: Arm Lower | Laterality: Right

## 2015-09-15 MED ORDER — OXYCODONE-ACETAMINOPHEN 10-325 MG PO TABS: 1.0000 | ORAL_TABLET | ORAL | Status: DC | PRN

## 2015-09-15 MED ORDER — PROMETHAZINE HCL 25 MG/ML IJ SOLN: 6.2500 mg | INTRAMUSCULAR | Status: DC | PRN

## 2015-09-15 MED ORDER — HYDROMORPHONE HCL 1 MG/ML IJ SOLN: 0.2500 mg | INTRAMUSCULAR | Status: DC | PRN

## 2015-09-15 MED ORDER — PROPOFOL 10 MG/ML IV BOLUS
INTRAVENOUS | Status: DC | PRN
  Administered 2015-09-15: 200 mg via INTRAVENOUS

## 2015-09-15 MED ORDER — 0.9 % SODIUM CHLORIDE (POUR BTL) OPTIME
TOPICAL | Status: DC | PRN
  Administered 2015-09-15: 1000 mL

## 2015-09-15 MED ORDER — CEFAZOLIN SODIUM-DEXTROSE 2-3 GM-% IV SOLR
INTRAVENOUS | Status: AC
  Filled 2015-09-15: qty 50

## 2015-09-15 MED ORDER — CEFAZOLIN SODIUM-DEXTROSE 2-3 GM-% IV SOLR
INTRAVENOUS | Status: DC | PRN
  Administered 2015-09-15: 2 g via INTRAVENOUS

## 2015-09-15 MED ORDER — DEXAMETHASONE SODIUM PHOSPHATE 4 MG/ML IJ SOLN
INTRAMUSCULAR | Status: AC
  Filled 2015-09-15: qty 1

## 2015-09-15 MED ORDER — HYDROCODONE-ACETAMINOPHEN 5-325 MG PO TABS: 1.0000 | ORAL_TABLET | Freq: Once | ORAL | Status: DC

## 2015-09-15 MED ORDER — LACTATED RINGERS IV SOLN
INTRAVENOUS | Status: DC | PRN
  Administered 2015-09-15: 21:00:00 via INTRAVENOUS

## 2015-09-15 MED ORDER — LIDOCAINE HCL (CARDIAC) 20 MG/ML IV SOLN
INTRAVENOUS | Status: DC | PRN
  Administered 2015-09-15: 100 mg via INTRAVENOUS

## 2015-09-15 MED ORDER — HYDROMORPHONE HCL 1 MG/ML IJ SOLN
1.0000 mg | Freq: Once | INTRAMUSCULAR | Status: AC
  Administered 2015-09-15: 1 mg via INTRAVENOUS
  Filled 2015-09-15: qty 1

## 2015-09-15 MED ORDER — ONDANSETRON HCL 4 MG/2ML IJ SOLN
INTRAMUSCULAR | Status: DC | PRN
  Administered 2015-09-15: 4 mg via INTRAVENOUS

## 2015-09-15 MED ORDER — OXYCODONE-ACETAMINOPHEN 5-325 MG PO TABS
2.0000 | ORAL_TABLET | ORAL | Status: DC | PRN
  Administered 2015-09-15: 2 via ORAL

## 2015-09-15 MED ORDER — BUPIVACAINE HCL (PF) 0.25 % IJ SOLN
INTRAMUSCULAR | Status: DC | PRN
  Administered 2015-09-15: 10 mL

## 2015-09-15 MED ORDER — FENTANYL CITRATE (PF) 100 MCG/2ML IJ SOLN
INTRAMUSCULAR | Status: DC | PRN
  Administered 2015-09-15: 50 ug via INTRAVENOUS

## 2015-09-15 MED ORDER — LIDOCAINE-EPINEPHRINE (PF) 2 %-1:200000 IJ SOLN
20.0000 mL | Freq: Once | INTRAMUSCULAR | Status: AC
  Administered 2015-09-15: 20 mL
  Filled 2015-09-15: qty 20

## 2015-09-15 MED ORDER — FENTANYL CITRATE (PF) 100 MCG/2ML IJ SOLN
100.0000 ug | Freq: Once | INTRAMUSCULAR | Status: AC
  Administered 2015-09-15: 100 ug via INTRAVENOUS
  Filled 2015-09-15: qty 2

## 2015-09-15 MED ORDER — FENTANYL CITRATE (PF) 250 MCG/5ML IJ SOLN
INTRAMUSCULAR | Status: AC
  Filled 2015-09-15: qty 5

## 2015-09-15 MED ORDER — VITAMIN C 500 MG PO TABS: 500.0000 mg | ORAL_TABLET | Freq: Every day | ORAL | Status: DC

## 2015-09-15 MED ORDER — BUPIVACAINE HCL (PF) 0.25 % IJ SOLN
INTRAMUSCULAR | Status: AC
  Filled 2015-09-15: qty 30

## 2015-09-15 MED ORDER — MIDAZOLAM HCL 5 MG/5ML IJ SOLN
INTRAMUSCULAR | Status: DC | PRN
  Administered 2015-09-15: 2 mg via INTRAVENOUS

## 2015-09-15 MED ORDER — HYDROCODONE-ACETAMINOPHEN 5-325 MG PO TABS
1.0000 | ORAL_TABLET | Freq: Once | ORAL | Status: AC | PRN
  Administered 2015-09-15: 1 via ORAL
  Filled 2015-09-15: qty 1

## 2015-09-15 MED ORDER — OXYCODONE-ACETAMINOPHEN 5-325 MG PO TABS
ORAL_TABLET | ORAL | Status: AC
  Filled 2015-09-15: qty 2

## 2015-09-15 MED ORDER — ARTIFICIAL TEARS OP OINT
TOPICAL_OINTMENT | OPHTHALMIC | Status: AC
  Filled 2015-09-15: qty 3.5

## 2015-09-15 MED ORDER — DEXAMETHASONE SODIUM PHOSPHATE 4 MG/ML IJ SOLN
INTRAMUSCULAR | Status: DC | PRN
  Administered 2015-09-15: 4 mg via INTRAVENOUS

## 2015-09-15 MED ORDER — PHENYLEPHRINE 40 MCG/ML (10ML) SYRINGE FOR IV PUSH (FOR BLOOD PRESSURE SUPPORT)
PREFILLED_SYRINGE | INTRAVENOUS | Status: AC
  Filled 2015-09-15: qty 20

## 2015-09-15 MED ORDER — ONDANSETRON HCL 4 MG/2ML IJ SOLN
INTRAMUSCULAR | Status: AC
  Filled 2015-09-15: qty 2

## 2015-09-15 MED ORDER — CEPHALEXIN 500 MG PO CAPS: 500.0000 mg | ORAL_CAPSULE | Freq: Four times a day (QID) | ORAL | Status: DC

## 2015-09-15 MED ORDER — LIDOCAINE HCL (CARDIAC) 20 MG/ML IV SOLN
INTRAVENOUS | Status: AC
  Filled 2015-09-15: qty 5

## 2015-09-15 MED ORDER — MIDAZOLAM HCL 2 MG/2ML IJ SOLN
INTRAMUSCULAR | Status: AC
  Filled 2015-09-15: qty 2

## 2015-09-15 MED ORDER — DOCUSATE SODIUM 100 MG PO CAPS: 100.0000 mg | ORAL_CAPSULE | Freq: Two times a day (BID) | ORAL | Status: DC

## 2015-09-15 MED ORDER — PROPOFOL 10 MG/ML IV BOLUS
INTRAVENOUS | Status: AC
  Filled 2015-09-15: qty 20

## 2015-09-15 SURGICAL SUPPLY — 47 items
BANDAGE ELASTIC 3 VELCRO ST LF (GAUZE/BANDAGES/DRESSINGS) ×3 IMPLANT
BANDAGE ELASTIC 4 VELCRO ST LF (GAUZE/BANDAGES/DRESSINGS) ×3 IMPLANT
BNDG COHESIVE 1X5 TAN STRL LF (GAUZE/BANDAGES/DRESSINGS) IMPLANT
BNDG ESMARK 4X9 LF (GAUZE/BANDAGES/DRESSINGS) ×3 IMPLANT
BNDG GAUZE ELAST 4 BULKY (GAUZE/BANDAGES/DRESSINGS) ×3 IMPLANT
CORDS BIPOLAR (ELECTRODE) ×3 IMPLANT
COVER SURGICAL LIGHT HANDLE (MISCELLANEOUS) ×3 IMPLANT
CUFF TOURNIQUET SINGLE 18IN (TOURNIQUET CUFF) ×3 IMPLANT
CUFF TOURNIQUET SINGLE 24IN (TOURNIQUET CUFF) IMPLANT
DRAPE SURG 17X23 STRL (DRAPES) ×3 IMPLANT
DRSG ADAPTIC 3X8 NADH LF (GAUZE/BANDAGES/DRESSINGS) ×3 IMPLANT
GAUZE SPONGE 2X2 8PLY STRL LF (GAUZE/BANDAGES/DRESSINGS) IMPLANT
GAUZE SPONGE 4X4 12PLY STRL (GAUZE/BANDAGES/DRESSINGS) IMPLANT
GLOVE BIOGEL PI IND STRL 8.5 (GLOVE) ×1 IMPLANT
GLOVE BIOGEL PI INDICATOR 8.5 (GLOVE) ×2
GLOVE SURG ORTHO 8.0 STRL STRW (GLOVE) ×3 IMPLANT
GOWN STRL REUS W/ TWL LRG LVL3 (GOWN DISPOSABLE) ×2 IMPLANT
GOWN STRL REUS W/ TWL XL LVL3 (GOWN DISPOSABLE) ×1 IMPLANT
GOWN STRL REUS W/TWL LRG LVL3 (GOWN DISPOSABLE) ×4
GOWN STRL REUS W/TWL XL LVL3 (GOWN DISPOSABLE) ×2
KIT BASIN OR (CUSTOM PROCEDURE TRAY) ×3 IMPLANT
KIT ROOM TURNOVER OR (KITS) ×3 IMPLANT
MANIFOLD NEPTUNE II (INSTRUMENTS) ×3 IMPLANT
NEEDLE HYPO 25GX1X1/2 BEV (NEEDLE) IMPLANT
NS IRRIG 1000ML POUR BTL (IV SOLUTION) ×3 IMPLANT
PACK ORTHO EXTREMITY (CUSTOM PROCEDURE TRAY) ×3 IMPLANT
PAD ARMBOARD 7.5X6 YLW CONV (MISCELLANEOUS) ×6 IMPLANT
PAD CAST 4YDX4 CTTN HI CHSV (CAST SUPPLIES) ×2 IMPLANT
PADDING CAST COTTON 4X4 STRL (CAST SUPPLIES) ×4
SOAP 2 % CHG 4 OZ (WOUND CARE) ×3 IMPLANT
SPECIMEN JAR SMALL (MISCELLANEOUS) ×3 IMPLANT
SPLINT FIBERGLASS 3X35 (CAST SUPPLIES) ×3 IMPLANT
SPONGE GAUZE 2X2 STER 10/PKG (GAUZE/BANDAGES/DRESSINGS)
SPONGE GAUZE 4X4 12PLY STER LF (GAUZE/BANDAGES/DRESSINGS) ×3 IMPLANT
SUCTION FRAZIER TIP 10 FR DISP (SUCTIONS) IMPLANT
SUT MERSILENE 4 0 P 3 (SUTURE) IMPLANT
SUT PROLENE 3 0 PS 2 (SUTURE) ×9 IMPLANT
SUT PROLENE 4 0 PS 2 18 (SUTURE) IMPLANT
SUT VIC AB 2-0 CT1 27 (SUTURE) ×4
SUT VIC AB 2-0 CT1 TAPERPNT 27 (SUTURE) ×2 IMPLANT
SYR CONTROL 10ML LL (SYRINGE) IMPLANT
TOWEL OR 17X24 6PK STRL BLUE (TOWEL DISPOSABLE) ×3 IMPLANT
TOWEL OR 17X26 10 PK STRL BLUE (TOWEL DISPOSABLE) ×3 IMPLANT
TUBE CONNECTING 12'X1/4 (SUCTIONS)
TUBE CONNECTING 12X1/4 (SUCTIONS) IMPLANT
UNDERPAD 30X30 INCONTINENT (UNDERPADS AND DIAPERS) ×3 IMPLANT
WATER STERILE IRR 1000ML POUR (IV SOLUTION) ×3 IMPLANT

## 2015-09-15 NOTE — Anesthesia Postprocedure Evaluation (Signed)
Anesthesia Post Note  Patient: Shane ChafeMichael S Denison  Procedure(s) Performed: Procedure(s) (LRB): RIGHT FOREARM EXPLORATION AND CLOSURE OF LACERATION, TENDON REPAIR (Right)  Patient location during evaluation: PACU Anesthesia Type: General Level of consciousness: awake and alert Pain management: satisfactory to patient Vital Signs Assessment: post-procedure vital signs reviewed and stable Respiratory status: spontaneous breathing, nonlabored ventilation, respiratory function stable and patient connected to nasal cannula oxygen Cardiovascular status: blood pressure returned to baseline and stable Postop Assessment: no signs of nausea or vomiting Anesthetic complications: no    Last Vitals:  Filed Vitals:   09/15/15 1945 09/15/15 2201  BP: 123/84 123/79  Pulse: 81   Temp:    Resp:  9    Last Pain:  Filed Vitals:   09/15/15 2206  PainSc: 7                  Kateleen Encarnacion,JAMES TERRILL

## 2015-09-15 NOTE — ED Notes (Signed)
Dr. Donnald GarrePfeiffer and Asher MuirJamie PA at bedside for laceration cleansing and repair. Pt tolerating well.

## 2015-09-15 NOTE — Anesthesia Procedure Notes (Signed)
Procedure Name: LMA Insertion Date/Time: 09/15/2015 9:03 PM Performed by: Julianne RiceBILOTTA, Benjamen Koelling Z Pre-anesthesia Checklist: Patient identified, Emergency Drugs available, Suction available, Patient being monitored and Timeout performed Patient Re-evaluated:Patient Re-evaluated prior to inductionOxygen Delivery Method: Circle system utilized Preoxygenation: Pre-oxygenation with 100% oxygen Intubation Type: IV induction Ventilation: Mask ventilation without difficulty LMA: LMA inserted LMA Size: 5.0 Tube type: Oral Number of attempts: 1 Placement Confirmation: positive ETCO2 and breath sounds checked- equal and bilateral Tube secured with: Tape Dental Injury: Teeth and Oropharynx as per pre-operative assessment

## 2015-09-15 NOTE — Discharge Instructions (Signed)
KEEP BANDAGE CLEAN AND DRY °CALL OFFICE FOR F/U APPT [phone] IN 8 DAYS °KEEP HAND ELEVATED ABOVE HEART °OK TO APPLY ICE TO OPERATIVE AREA °CONTACT OFFICE IF ANY WORSENING PAIN OR CONCERNS. °

## 2015-09-15 NOTE — Transfer of Care (Signed)
Immediate Anesthesia Transfer of Care Note  Patient: Shane ChafeMichael S Belmares  Procedure(s) Performed: Procedure(s): RIGHT FOREARM EXPLORATION AND CLOSURE OF LACERATION, TENDON REPAIR (Right)  Patient Location: PACU  Anesthesia Type:General  Level of Consciousness: awake and patient cooperative  Airway & Oxygen Therapy: Patient Spontanous Breathing and Patient connected to nasal cannula oxygen  Post-op Assessment: Report given to RN and Post -op Vital signs reviewed and stable  Post vital signs: Reviewed and stable  Last Vitals:  Filed Vitals:   09/15/15 1930 09/15/15 1945  BP: 113/75 123/84  Pulse: 83 81  Temp:    Resp:      Complications: No apparent anesthesia complications

## 2015-09-15 NOTE — Brief Op Note (Signed)
09/15/2015  6:36 PM  PATIENT:  Shane ChafeMichael S [name]  44 y.o. male  PRE-OPERATIVE DIAGNOSIS:  right FOREARM  POST-OPERATIVE DIAGNOSIS:  * No post-op diagnosis entered *  PROCEDURE:    SURGEON:  Surgeon(s) and Role:    * Bradly BienenstockFred Ily Denno, MD - Primary  PHYSICIAN ASSISTANT:   ASSISTANTS: none   ANESTHESIA:   general  EBL:     BLOOD ADMINISTERED:none  DRAINS: none   LOCAL MEDICATIONS USED:  MARCAINE     SPECIMEN:  No Specimen  DISPOSITION OF SPECIMEN:  N/A  COUNTS:  YES  TOURNIQUET:  * No tourniquets in log *  [id number]*  [id number] job id  PLAN OF CARE: Discharge to home after PACU  PATIENT DISPOSITION:  PACU - hemodynamically stable.   Delay start of Pharmacological VTE agent (>24hrs) due to surgical blood loss or risk of bleeding: not applicable

## 2015-09-15 NOTE — ED Notes (Signed)
Contacted OR regarding delay. Pt and family updated.

## 2015-09-15 NOTE — ED Notes (Signed)
[facility] [employer] with c/o right forearm laceration from a saw.  3inch crescent shaped lac with bleeding controlled.

## 2015-09-15 NOTE — ED Provider Notes (Signed)
CSN: [id number]     Arrival date & time 09/15/15  1004 History   First MD Initiated Contact with Patient 09/15/15 1006     Chief Complaint  Patient presents with  . Extremity Laceration     (Consider location/radiation/quality/duration/timing/severity/associated sxs/prior Treatment) HPI  Shane [name] is a 44 y.o. male  who presents to the Emergency Department after injuring his right arm at ~ 9:00 this morning. Patient was at work, reaching into a shed when he briskly ran his right arm against a saw. Patient states saw was clean with no rust, and little debris was in the area. Pain described as achy, constant 6/10. Tetanus updated in January of 2013. Denies numbness, tingling.    Past Medical History  Diagnosis Date  . ADD (attention deficit disorder)   . Depression   . Substance abuse     alcohol; none since 12/11/2012   History reviewed. No pertinent past surgical history. Family History  Problem Relation Age of Onset  . Cancer Father    Social History  Substance Use Topics  . Smoking status: Current Some Day Smoker  . Smokeless tobacco: Never Used  . Alcohol Use: No     Comment: no alcohol since 12/16/2012    Review of Systems  Constitutional: Negative.   HENT: Negative for congestion, rhinorrhea and sore throat.   Eyes: Negative for visual disturbance.  Respiratory: Negative for cough, shortness of breath and wheezing.   Cardiovascular: Negative.   Gastrointestinal: Negative for nausea, vomiting, abdominal pain, diarrhea and constipation.  Endocrine: Negative for polydipsia and polyuria.  Musculoskeletal: Positive for myalgias. Negative for back pain and neck pain.  Skin: Positive for wound.  Neurological: Negative for dizziness, weakness and headaches.      Allergies  Review of patient's allergies indicates no known allergies.  Home Medications   Prior to Admission medications   Medication Sig Start Date End Date Taking? Authorizing Provider    albuterol (PROVENTIL HFA;VENTOLIN HFA) 108 (90 BASE) MCG/ACT inhaler Inhale into the lungs every 6 (six) hours as needed for wheezing or shortness of breath.   Yes Historical Provider, MD  amphetamine-dextroamphetamine (ADDERALL) 20 MG tablet Take 1 tablet (20 mg total) by mouth 2 (two) times daily. 07/21/15  Yes Edwena Felty, MD  buPROPion (WELLBUTRIN XL) 300 MG 24 hr tablet Take 1 tablet (300 mg total) by mouth daily. 09/07/14  Yes Morrell Riddle, PA-C  cyclobenzaprine (FLEXERIL) 10 MG tablet Take 1 tablet (10 mg total) by mouth at bedtime. Patient taking differently: Take 10 mg by mouth at bedtime as needed for muscle spasms.  03/17/15  Yes Edwena Felty, MD  mometasone-formoterol (DULERA) 200-5 MCG/ACT AERO Inhale 2 puffs into the lungs 2 (two) times daily.   Yes Historical Provider, MD  montelukast (SINGULAIR) 10 MG tablet Take 10 mg by mouth at bedtime.   Yes Historical Provider, MD  amoxicillin-clavulanate (AUGMENTIN) 875-125 MG tablet Take 1 tablet by mouth 2 (two) times daily. Patient not taking: Reported on 09/15/2015 08/26/15   Carmelina Dane, MD  amphetamine-dextroamphetamine (ADDERALL) 20 MG tablet Take 1 tablet (20 mg total) by mouth 2 (two) times daily. Patient not taking: Reported on 09/15/2015 07/21/15   Edwena Felty, MD  amphetamine-dextroamphetamine (ADDERALL) 20 MG tablet Take 1 tablet (20 mg total) by mouth 2 (two) times daily. Patient not taking: Reported on 09/15/2015 07/21/15   Edwena Felty, MD  chlorpheniramine-HYDROcodone [facility]) 10-8 MG/5ML SUER Take 5 mLs by mouth 2 (two) times daily. Patient not taking:  Reported on 09/15/2015 08/26/15   Carmelina Dane, MD  docusate sodium (COLACE) 100 MG capsule Take 1 capsule (100 mg total) by mouth 2 (two) times daily. 09/15/15   Bradly Bienenstock, MD  oxyCODONE-acetaminophen (PERCOCET) 10-325 MG tablet Take 1 tablet by mouth every 4 (four) hours as needed for pain. 09/15/15   Bradly Bienenstock, MD   pseudoephedrine-guaifenesin (MUCINEX D) 60-600 MG 12 hr tablet Take 1 tablet by mouth every 12 (twelve) hours. Patient not taking: Reported on 09/15/2015 08/26/15 08/25/16  Carmelina Dane, MD  triamcinolone (NASACORT AQ) 55 MCG/ACT AERO nasal inhaler Place 2 sprays into the nose daily. Patient not taking: Reported on 09/15/2015 08/26/15   Carmelina Dane, MD  vitamin C (ASCORBIC ACID) 500 MG tablet Take 1 tablet (500 mg total) by mouth daily. 09/15/15   Bradly Bienenstock, MD   BP 113/75 mmHg  Pulse 83  Temp(Src) 98.8 F (37.1 C) (Oral)  Resp 16  SpO2 97% Physical Exam  Constitutional: He is oriented to person, place, and time. He appears well-developed and well-nourished.  Alert and in no acute distress  HENT:  Head: Normocephalic and atraumatic.  Cardiovascular: Normal rate, regular rhythm, normal heart sounds and intact distal pulses.  Exam reveals no gallop and no friction rub.   No murmur heard. Pulmonary/Chest: Effort normal and breath sounds normal. No respiratory distress. He has no wheezes. He has no rales. He exhibits no tenderness.  Abdominal: He exhibits no mass. There is no rebound and no guarding.  Abdomen soft, non-tender, non-distended Bowel sounds positive in all four quadrants  Musculoskeletal: He exhibits no edema.  5/5 muscle strength of right upper extremity.   Neurological: He is alert and oriented to person, place, and time.  RUE sensation intact  Skin: Skin is warm and dry.     8-9 cm laceration with visible muscle and tendon involvement.   Psychiatric: He has a normal mood and affect. His behavior is normal. Judgment and thought content normal.  Nursing note and vitals reviewed.   ED Course  Procedures (including critical care time) Labs Review Labs Reviewed  COMPREHENSIVE METABOLIC PANEL - Abnormal; Notable for the following:    Glucose, Bld 109 (*)    All other components within normal limits  CBC WITH DIFFERENTIAL/PLATELET  PROTIME-INR  APTT     Imaging Review Dg Forearm Right  09/15/2015  CLINICAL DATA:  Laceration EXAM: RIGHT FOREARM - 2 VIEW COMPARISON:  None. FINDINGS: Soft tissue laceration mid forearm. No fracture or foreign body. No arthropathy IMPRESSION: Laceration mid forearm.  No bony abnormality Electronically Signed   By: Marlan Palau M.D.   On: 09/15/2015 12:54   I have personally reviewed and evaluated these images and lab results as part of my medical decision-making.   EKG Interpretation None      MDM   Final diagnoses:  Laceration of upper extremity, right, initial encounter  AURELIANO OSHIELDS presents with extremity laceration of RUE. Tetanus updated in Jan. 2013. Good pulse; ulnar, radial, and median nerve sensation intact. Decreased strength with ulnar deviation; all other muscle strength of RUE 5/5. During wound exploration performed by Dr. Donnald Garre, tendon damage was noted, therefore hand surgery was consulted for further recommendations.   Imaging: xray shows no bony abnormality.   Consults: Hand Surgery, Dr. Melvyn Novas  Therapeutics: Pain control during ED stay.   A&P: Extremity laceration  - Wound cleaned, numbed with 20 ml of lidocaine with epi, and explored  - Hand surgery to take to OR  today for repair.  - Pre op labs, EKG, x-ray obtained at Dr. Glenna Durandrtmann's request  7:01 PM - Patient seen by Dr. Melvyn Novasrtmann and told that he will be going to OR tonight and discharged to home following procedure.   Patient seen by and discussed with Dr. Donnald GarrePfeiffer who agrees with treatment plan.  [facility] Surgery CenterJaime Pilcher Ward, PA-C 09/15/15 1948  Arby BarretteMarcy Pfeiffer, MD 09/17/15 0001

## 2015-09-15 NOTE — Anesthesia Preprocedure Evaluation (Addendum)
Anesthesia Evaluation  Patient identified by MRN, date of birth, ID band Patient awake    Reviewed: Allergy & Precautions, NPO status   Airway Mallampati: I   Neck ROM: Full    Dental  (+) Teeth Intact   Pulmonary Current Smoker,    breath sounds clear to auscultation       Cardiovascular negative cardio ROS   Rhythm:Regular Rate:Normal     Neuro/Psych    GI/Hepatic negative GI ROS, Neg liver ROS,   Endo/Other  negative endocrine ROS  Renal/GU negative Renal ROS     Musculoskeletal   Abdominal   Peds  Hematology   Anesthesia Other Findings   Reproductive/Obstetrics                            Anesthesia Physical Anesthesia Plan  ASA: I  Anesthesia Plan: General   Post-op Pain Management:    Induction: Intravenous  Airway Management Planned: LMA  Additional Equipment:   Intra-op Plan:   Post-operative Plan: Extubation in OR  Informed Consent: I have reviewed the patients History and Physical, chart, labs and discussed the procedure including the risks, benefits and alternatives for the proposed anesthesia with the patient or authorized representative who has indicated his/her understanding and acceptance.     Plan Discussed with: CRNA and Surgeon  Anesthesia Plan Comments:         Anesthesia Quick Evaluation

## 2015-09-15 NOTE — H&P (Signed)
Shane [name] is an 44 y.o. male.   Chief Complaint: right forearm laceration HPI: Shane [name] is a 44 y.o. male who presents to the Emergency Department after injuring his right arm at ~ 9:00 this morning. Patient was at work, reaching into a shed when he briskly ran his right arm against a saw. Patient states saw was clean with no rust, and little debris was in the area. Pain described as achy, constant 6/10. Tetanus updated in January of 2013. Denies numbness, tingling.   Past Medical History  Diagnosis Date  . ADD (attention deficit disorder)   . Depression   . Substance abuse     alcohol; none since 12/11/2012    History reviewed. No pertinent past surgical history.  Family History  Problem Relation Age of Onset  . Cancer Father    Social History:  reports that he has been smoking.  He has never used smokeless tobacco. He reports that he does not drink alcohol or use illicit drugs.  Allergies: No Known Allergies   (Not in a hospital admission)  Results for orders placed or performed during the hospital encounter of 09/15/15 (from the past 48 hour(s))  CBC with Differential     Status: None   Collection Time: 09/15/15 12:58 PM  Result Value Ref Range   WBC 6.1 4.0 - 10.5 K/uL   RBC 5.12 4.22 - 5.81 MIL/uL   Hemoglobin 16.2 13.0 - 17.0 g/dL   HCT 47.0 39.0 - 52.0 %   MCV 91.8 78.0 - 100.0 fL   MCH 31.6 26.0 - 34.0 pg   MCHC 34.5 30.0 - 36.0 g/dL   RDW 12.8 11.5 - 15.5 %   Platelets 275 150 - 400 K/uL   Neutrophils Relative % 65 %   Neutro Abs 4.0 1.7 - 7.7 K/uL   Lymphocytes Relative 25 %   Lymphs Abs 1.6 0.7 - 4.0 K/uL   Monocytes Relative 7 %   Monocytes Absolute 0.4 0.1 - 1.0 K/uL   Eosinophils Relative 3 %   Eosinophils Absolute 0.2 0.0 - 0.7 K/uL   Basophils Relative 0 %   Basophils Absolute 0.0 0.0 - 0.1 K/uL  Comprehensive metabolic panel     Status: Abnormal   Collection Time: 09/15/15 12:58 PM  Result Value Ref Range   Sodium 137 135 - 145  mmol/L   Potassium 4.1 3.5 - 5.1 mmol/L   Chloride 104 101 - 111 mmol/L   CO2 25 22 - 32 mmol/L   Glucose, Bld 109 (H) 65 - 99 mg/dL   BUN 11 6 - 20 mg/dL   Creatinine, Ser 1.03 0.61 - 1.24 mg/dL   Calcium 9.3 8.9 - 10.3 mg/dL   Total Protein 7.0 6.5 - 8.1 g/dL   Albumin 4.0 3.5 - 5.0 g/dL   AST 26 15 - 41 U/L   ALT 36 17 - 63 U/L   Alkaline Phosphatase 103 38 - 126 U/L   Total Bilirubin 1.1 0.3 - 1.2 mg/dL   GFR calc non Af Amer >60 >60 mL/min   GFR calc Af Amer >60 >60 mL/min    Comment: (NOTE) The eGFR has been calculated using the CKD EPI equation. This calculation has not been validated in all clinical situations. eGFR's persistently <60 mL/min signify possible Chronic Kidney Disease.    Anion gap 8 5 - 15  Protime-INR     Status: None   Collection Time: 09/15/15 12:58 PM  Result Value Ref Range   Prothrombin Time  13.5 11.6 - 15.2 seconds   INR 1.01 0.00 - 1.49  APTT     Status: None   Collection Time: 09/15/15 12:58 PM  Result Value Ref Range   aPTT 28 24 - 37 seconds   Dg Forearm Right  09/15/2015  CLINICAL DATA:  Laceration EXAM: RIGHT FOREARM - 2 VIEW COMPARISON:  None. FINDINGS: Soft tissue laceration mid forearm. No fracture or foreign body. No arthropathy IMPRESSION: Laceration mid forearm.  No bony abnormality Electronically Signed   By: Franchot Gallo M.D.   On: 09/15/2015 12:54    ROSNO RECENT ILLNESSES OR HOSPITALIZATIONS  Blood pressure 118/83, pulse 96, temperature 98.8 F (37.1 C), temperature source Oral, resp. rate 16, SpO2 95 %. Physical Exam : General Appearance:  Alert, cooperative, no distress, appears stated age  Head:  Normocephalic, without obvious abnormality, atraumatic  Eyes:  Pupils equal, conjunctiva/corneas clear,         Throat: Lips, mucosa, and tongue normal; teeth and gums normal  Neck: No visible masses     Lungs:   respirations unlabored  Chest Wall:  No tenderness or deformity  Heart:  Regular rate and rhythm,  Abdomen:    Soft, non-tender,         Extremities: RIGHT UE: BANDAGE IN PLACE PICTURE OF WOUND VIEWED FROM PHONE ABLE TO EXTEND THUMB ABLE TO EXTEND DIGITS ABLE TO FLEX PIP AND DIP JOINTS ABLE TO FLEX THUMB IP JOINT  Pulses: 2+ and symmetric  Skin: Skin color, texture, turgor normal, no rashes or lesions     Neurologic: Normal    Assessment/Plan RIGHT FOREARM LACERATION WITH TENDON INVOLVEMENT  RIGHT FOREARM WOUND EXPLORATION AND REPAIR AS INDICATED  R/B/A DISCUSSED WITH PT IN OFFICE.  PT VOICED UNDERSTANDING OF PLAN CONSENT SIGNED DAY OF SURGERY PT SEEN AND EXAMINED PRIOR TO OPERATIVE PROCEDURE/DAY OF SURGERY SITE MARKED. QUESTIONS ANSWERED WILL GO HOME FOLLOWING SURGERY  WE ARE PLANNING SURGERY FOR YOUR UPPER EXTREMITY. THE RISKS AND BENEFITS OF SURGERY INCLUDE BUT NOT LIMITED TO BLEEDING INFECTION, DAMAGE TO NEARBY NERVES ARTERIES TENDONS, FAILURE OF SURGERY TO ACCOMPLISH ITS INTENDED GOALS, PERSISTENT SYMPTOMS AND NEED FOR FURTHER SURGICAL INTERVENTION. WITH THIS IN MIND WE WILL PROCEED. I HAVE DISCUSSED WITH THE PATIENT THE PRE AND POSTOPERATIVE REGIMEN AND THE DOS AND DON'TS. PT VOICED UNDERSTANDING AND INFORMED CONSENT SIGNED.  Linna Hoff 09/15/2015, 6:33 PM

## 2015-09-16 ENCOUNTER — Encounter (HOSPITAL_COMMUNITY): Payer: Self-pay | Admitting: Orthopedic Surgery

## 2015-09-16 NOTE — Op Note (Signed)
Shane [name], Shane [name] NO.:  [id number]  MEDICAL RECORD NO.:  [id number]  LOCATION:  [facility]                         FACILITY:  [facility]  PHYSICIAN:  Sharma Covert IV, M.D.DATE OF BIRTH:  [birth year]  DATE OF PROCEDURE:  09/15/2015 DATE OF DISCHARGE:  09/15/2015                              OPERATIVE REPORT   PREOPERATIVE DIAGNOSIS:  Right forearm laceration with tendon involvement.  POSTOPERATIVE DIAGNOSIS:  Right forearm laceration with tendon involvement.  ATTENDING PHYSICIAN:  Sharma Covert, M.D., who scrubbed and present for the entire procedure.  ASSISTANT SURGEON:  None.  ANESTHESIA:  General via LMA.  PROCEDURE: 1. Right forearm wound exploration and tendon repair, extensor carpi     ulnaris. 2. Right forearm muscle repair, extensor digitorum communis. 3. Right forearm muscle repair, extensor carpi radialis brevis. 4. Right forearm laceration, traumatic laceration, 7.5 Cm.  SURGICAL INDICATIONS:  Mr. Cubit is a right-hand-dominant gentleman, who sustained a penetrating injury to the dorsal aspect of the right forearm.  The patient was seen and evaluated in the hospital and recommended to undergo the above procedure.  Risks, benefits, and alternatives were discussed in detail with the patient and signed informed consent was obtained.  Risks include, but not limited to bleeding; infection; damage to nearby nerves, arteries, or tendons; loss of motion of wrist and digits; incomplete relief of symptoms; tendon rupture; need for further surgical intervention.  DESCRIPTION OF PROCEDURE:  The patient was properly identified in the preoperative holding area and marked with a permanent marker on the right forearm to indicate the correct operative site.  The patient was brought back to the operating room, placed supine on the anesthesia table.  General anesthesia was administered.  The patient tolerated this well.  Preoperative antibiotics were given.   A well-padded tourniquet placed on the right brachium and sealed with 1000 drape.  Right upper extremity was then prepped and draped in normal sterile fashion.  Time- out was called, correct side was identified, and procedure then begun. Attention then turned to the right forearm.  The traumatic laceration 7.5 cm was extended proximally.  Limb was then elevated and tourniquet inflated.  Dissection was carried down through skin and subcutaneous tissue.  The fascial layer had been opened up in order to expose the laceration that extending into the muscle bellies of the EDC, ECR, and ECRB.  Cut the ECU at the musculotendinous junction.  Once this was identified, the wound was then irrigated.  Thorough wound irrigation done.  The ECU was then repaired with 2-0 Vicryl suture.  The tendon was nicely repaired as well as the muscle overlying with figure-of-eight horizontal mattress sutures.  Following this, the muscular unit was then repaired of the EDC and the ECRB with 2-0 Vicryl and the fascia was then closed overlying completing the repair with a 2-0 Vicryl suture. Thorough wound irrigation done.  Following this, traumatic laceration was then repaired with Prolene sutures.  10 mL of 0.25% Marcaine infiltrated locally.  Adaptic dressing, sterile compressive bandage applied.  The patient was then placed in a short-arm volar wrist splint in slight extension.  Extubated and taken to recovery room in good condition.  POSTPROCEDURE PLAN:  The patient  will be discharged home, seen back in the office in approximately 8 days for wound check and begin a postoperative zone 7 extensor ECU repair protocol.  No radiographs at the visit.     Madelynn DoneFred W. Dempsey Ahonen IV, M.D.     FWO/MEDQ  D:  09/15/2015  T:  09/16/2015  Job:  [id number]

## 2015-09-25 ENCOUNTER — Telehealth: Payer: Self-pay | Admitting: Family Medicine

## 2015-09-25 NOTE — Telephone Encounter (Signed)
Only have 1 pill left of the wellbutrin, [facility]

## 2015-09-30 ENCOUNTER — Other Ambulatory Visit: Payer: Self-pay | Admitting: Family Medicine

## 2015-09-30 DIAGNOSIS — F419 Anxiety disorder, unspecified: Secondary | ICD-10-CM

## 2015-09-30 MED ORDER — BUPROPION HCL ER (XL) 300 MG PO TB24: 300.0000 mg | ORAL_TABLET | Freq: Every day | ORAL | Status: DC

## 2015-09-30 NOTE — Telephone Encounter (Signed)
Let him know [facility]

## 2015-10-01 NOTE — Telephone Encounter (Signed)
Left voice message to inform patient prescription has been sent to pharmacy

## 2015-10-21 ENCOUNTER — Encounter: Payer: Self-pay | Admitting: Family Medicine

## 2015-10-21 ENCOUNTER — Ambulatory Visit (INDEPENDENT_AMBULATORY_CARE_PROVIDER_SITE_OTHER): Payer: 59 | Admitting: Family Medicine

## 2015-10-21 VITALS — BP 120/74 | HR 102 | Temp 98.3°F | Resp 14 | Ht 74.0 in | Wt 210.9 lb

## 2015-10-21 DIAGNOSIS — F419 Anxiety disorder, unspecified: Secondary | ICD-10-CM | POA: Diagnosis not present

## 2015-10-21 DIAGNOSIS — M791 Myalgia, unspecified site: Secondary | ICD-10-CM

## 2015-10-21 DIAGNOSIS — J4530 Mild persistent asthma, uncomplicated: Secondary | ICD-10-CM | POA: Diagnosis not present

## 2015-10-21 DIAGNOSIS — F909 Attention-deficit hyperactivity disorder, unspecified type: Secondary | ICD-10-CM

## 2015-10-21 DIAGNOSIS — J309 Allergic rhinitis, unspecified: Secondary | ICD-10-CM

## 2015-10-21 DIAGNOSIS — F988 Other specified behavioral and emotional disorders with onset usually occurring in childhood and adolescence: Secondary | ICD-10-CM

## 2015-10-21 MED ORDER — MOMETASONE FURO-FORMOTEROL FUM 200-5 MCG/ACT IN AERO: 2.0000 | INHALATION_SPRAY | Freq: Two times a day (BID) | RESPIRATORY_TRACT | Status: DC

## 2015-10-21 MED ORDER — AMPHETAMINE-DEXTROAMPHETAMINE 20 MG PO TABS: 20.0000 mg | ORAL_TABLET | Freq: Two times a day (BID) | ORAL | Status: DC

## 2015-10-21 MED ORDER — MONTELUKAST SODIUM 10 MG PO TABS: 10.0000 mg | ORAL_TABLET | Freq: Every day | ORAL | Status: DC

## 2015-10-21 MED ORDER — CYCLOBENZAPRINE HCL 10 MG PO TABS: 10.0000 mg | ORAL_TABLET | Freq: Every evening | ORAL | Status: DC | PRN

## 2015-10-21 MED ORDER — ALBUTEROL SULFATE HFA 108 (90 BASE) MCG/ACT IN AERS: 1.0000 | INHALATION_SPRAY | Freq: Four times a day (QID) | RESPIRATORY_TRACT | Status: DC | PRN

## 2015-10-21 MED ORDER — AMPHETAMINE-DEXTROAMPHETAMINE 20 MG PO TABS
20.0000 mg | ORAL_TABLET | Freq: Two times a day (BID) | ORAL | Status: DC
Start: 2015-10-21 — End: 2016-02-24

## 2015-10-21 MED ORDER — LORATADINE 10 MG PO TABS: 10.0000 mg | ORAL_TABLET | Freq: Every day | ORAL | Status: DC

## 2015-10-21 NOTE — Progress Notes (Signed)
Name: Shane [name]   MRN: [id number]    DOB: 10-23-1970   Date:10/21/2015       Progress Note  Subjective  Chief Complaint  Chief Complaint  Patient presents with  . Medication Refill    follow-up  . Asthma  . Depression  . Allergic Rhinitis   . ADHD  . Arm Injury    right arm in december cut it at work with a saw and had to have surgery    HPI  Shane [name] is a 45 year old male with a lifelong history of increased motor activity with additional behaviors that include disruptive behavior, impulsivity, inattention, low self-confidence and need for frequent task redirection. Odyn is reported to have a pattern of low self-esteem and troublesome relationships with family and peers in the past which have improved. Especially since he has achieved sobriety and abstinence from alcohol. Current medication regimen include Adderall 69m po bid. He reports controled distractibility at work. There is no reported side effects such as chest pain, headaches, palpitations, appetite changes, GI upset.  Asthma well controled on Singulair daily, Dulera PRN along with PRN use of albuterol inhaler (which he has not needed in some months). Likely foam dust exposure at work has contributed reactive airway disease. Using Wellbutrin 300 mg XR daily for his moods.   Otherwise he had an accidental trauma to his right arm muscle tissue requiring surgical intervention. Now well on his way to recovery. Still working but not so much hands on activity.     Past Medical History  Diagnosis Date  . ADD (attention deficit disorder)   . Depression   . Substance abuse     alcohol; none since 12/11/2012    Patient Active Problem List   Diagnosis Date Noted  . Right ankle sprain 07/21/2015  . Restless sleeper 03/17/2015  . Achilles tendinitis of both lower extremities 03/17/2015  . Muscle pain 03/17/2015  . Anxiety 12/18/2013  . H/O ETOH abuse 12/18/2013  . ADD (attention deficit disorder) 04/07/2013   . High cholesterol 12/17/2012    Social History  Substance Use Topics  . Smoking status: Current Some Day Smoker  . Smokeless tobacco: Never Used  . Alcohol Use: No     Comment: no alcohol since 12/16/2012     Current outpatient prescriptions:  .  albuterol (PROVENTIL HFA;VENTOLIN HFA) 108 (90 BASE) MCG/ACT inhaler, Inhale into the lungs every 6 (six) hours as needed for wheezing or shortness of breath., Disp: , Rfl:  .  amphetamine-dextroamphetamine (ADDERALL) 20 MG tablet, Take 1 tablet (20 mg total) by mouth 2 (two) times daily. (Patient not taking: Reported on 09/15/2015), Disp: 60 tablet, Rfl: 0 .  amphetamine-dextroamphetamine (ADDERALL) 20 MG tablet, Take 1 tablet (20 mg total) by mouth 2 (two) times daily. (Patient not taking: Reported on 09/15/2015), Disp: 60 tablet, Rfl: 0 .  amphetamine-dextroamphetamine (ADDERALL) 20 MG tablet, Take 1 tablet (20 mg total) by mouth 2 (two) times daily., Disp: 60 tablet, Rfl: 0 .  buPROPion (WELLBUTRIN XL) 300 MG 24 hr tablet, Take 1 tablet (300 mg total) by mouth daily., Disp: 90 tablet, Rfl: 2 .  cyclobenzaprine (FLEXERIL) 10 MG tablet, Take 1 tablet (10 mg total) by mouth at bedtime. (Patient taking differently: Take 10 mg by mouth at bedtime as needed for muscle spasms. ), Disp: 30 tablet, Rfl: 5 .  mometasone-formoterol (DULERA) 200-5 MCG/ACT AERO, Inhale 2 puffs into the lungs 2 (two) times daily., Disp: , Rfl:  .  montelukast (SINGULAIR) 10  MG tablet, Take 10 mg by mouth at bedtime., Disp: , Rfl:  .  vitamin C (ASCORBIC ACID) 500 MG tablet, Take 1 tablet (500 mg total) by mouth daily., Disp: 50 tablet, Rfl: 0  Past Surgical History  Procedure Laterality Date  . Tendon repair Right 09/15/2015    Procedure: RIGHT FOREARM EXPLORATION AND CLOSURE OF LACERATION, TENDON REPAIR;  Surgeon: Iran Planas, MD;  Location: [facility];  Service: Orthopedics;  Laterality: Right;    Family History  Problem Relation Age of Onset  . Cancer Father     No  Known Allergies   Review of Systems  CONSTITUTIONAL: No significant weight changes, fever, chills, weakness or fatigue.  CARDIOVASCULAR: No chest pain, chest pressure or chest discomfort. No palpitations or edema.  RESPIRATORY: No shortness of breath, cough or sputum.  GASTROINTESTINAL: No anorexia, nausea, vomiting. No changes in bowel habits. No abdominal pain or blood.  NEUROLOGICAL: No headache, dizziness, syncope, paralysis, ataxia, numbness or tingling in the extremities. No memory changes. No change in bowel or bladder control.  MUSCULOSKELETAL: Yes joint pain. Yes muscle pain. HEMATOLOGIC: No anemia, bleeding or bruising.  LYMPHATICS: No enlarged lymph nodes.  PSYCHIATRIC: No change in mood. No change in sleep pattern.  ENDOCRINOLOGIC: No reports of sweating, cold or heat intolerance. No polyuria or polydipsia.     Objective  BP 120/74 mmHg  Pulse 102  Temp(Src) 98.3 F (36.8 C) (Oral)  Resp 14  Ht _0  (1.88 m)  Wt 210 lb 14.4 oz (95.664 kg)  BMI 27.07 kg/m2  SpO2 98% Body mass index is 27.07 kg/(m^2).  Physical Exam  Constitutional: Patient appears well-developed and well-nourished. In no distress.  Cardiovascular: Normal rate, regular rhythm and normal heart sounds.  No murmur heard.  Pulmonary/Chest: Effort normal and breath sounds normal. No respiratory distress. Musculoskeletal: Normal [name] of motion bilateral UE and LE, no joint effusions. Right arm well healed flap scar on forearm. Good eversion, inversion, flexion and extension at wrist using the extensor muscles that were torn. Neurological: CN II-XII grossly intact with no focal deficits. Alert and oriented to person, place, and time. Coordination, balance, strength, speech and gait are normal.  Skin: Skin is warm and dry. No rash noted. No erythema.  Psychiatric: Patient has a stable mood and affect. Behavior is normal in office today. Judgment and thought content normal in office today.   Recent  Results (from the past 2160 hour(s))  CBC with Differential     Status: None   Collection Time: 09/15/15 12:58 PM  Result Value Ref [name]   WBC 6.1 4.0 - 10.5 K/uL   RBC 5.12 4.22 - 5.81 MIL/uL   Hemoglobin 16.2 13.0 - 17.0 g/dL   HCT 47.0 39.0 - 52.0 %   MCV 91.8 78.0 - 100.0 fL   MCH 31.6 26.0 - 34.0 pg   MCHC 34.5 30.0 - 36.0 g/dL   RDW 12.8 11.5 - 15.5 %   Platelets 275 150 - 400 K/uL   Neutrophils Relative % 65 %   Neutro Abs 4.0 1.7 - 7.7 K/uL   Lymphocytes Relative 25 %   Lymphs Abs 1.6 0.7 - 4.0 K/uL   Monocytes Relative 7 %   Monocytes Absolute 0.4 0.1 - 1.0 K/uL   Eosinophils Relative 3 %   Eosinophils Absolute 0.2 0.0 - 0.7 K/uL   Basophils Relative 0 %   Basophils Absolute 0.0 0.0 - 0.1 K/uL  Comprehensive metabolic panel     Status: Abnormal   Collection  Time: 09/15/15 12:58 PM  Result Value Ref [name]   Sodium 137 135 - 145 mmol/L   Potassium 4.1 3.5 - 5.1 mmol/L   Chloride 104 101 - 111 mmol/L   CO2 25 22 - 32 mmol/L   Glucose, Bld 109 (H) 65 - 99 mg/dL   BUN 11 6 - 20 mg/dL   Creatinine, Ser 1.03 0.61 - 1.24 mg/dL   Calcium 9.3 8.9 - 10.3 mg/dL   Total Protein 7.0 6.5 - 8.1 g/dL   Albumin 4.0 3.5 - 5.0 g/dL   AST 26 15 - 41 U/L   ALT 36 17 - 63 U/L   Alkaline Phosphatase 103 38 - 126 U/L   Total Bilirubin 1.1 0.3 - 1.2 mg/dL   GFR calc non Af Amer >60 >60 mL/min   GFR calc Af Amer >60 >60 mL/min    Comment: (NOTE) The eGFR has been calculated using the CKD EPI equation. This calculation has not been validated in all clinical situations. eGFR's persistently <60 mL/min signify possible Chronic Kidney Disease.    Anion gap 8 5 - 15  Protime-INR     Status: None   Collection Time: 09/15/15 12:58 PM  Result Value Ref [name]   Prothrombin Time 13.5 11.6 - 15.2 seconds   INR 1.01 0.00 - 1.49  APTT     Status: None   Collection Time: 09/15/15 12:58 PM  Result Value Ref [name]   aPTT 28 24 - 37 seconds     Assessment & Plan  1. ADD (attention deficit  disorder) Stable. Refilled for 3 month supply.  - amphetamine-dextroamphetamine (ADDERALL) 20 MG tablet; Take 1 tablet (20 mg total) by mouth 2 (two) times daily.  Dispense: 60 tablet; Refill: 0 - amphetamine-dextroamphetamine (ADDERALL) 20 MG tablet; Take 1 tablet (20 mg total) by mouth 2 (two) times daily.  Dispense: 60 tablet; Refill: 0 - amphetamine-dextroamphetamine (ADDERALL) 20 MG tablet; Take 1 tablet (20 mg total) by mouth 2 (two) times daily.  Dispense: 60 tablet; Refill: 0  2. Muscle pain Stable back pain, using flexeril prn.  - cyclobenzaprine (FLEXERIL) 10 MG tablet; Take 1 tablet (10 mg total) by mouth at bedtime as needed for muscle spasms.  Dispense: 90 tablet; Refill: 3  3. Anxiety Stable mood disorder. Using Wellbutrin 300 mg XR daily.  4. Mild persistent asthma without complication Stable.  - albuterol (PROVENTIL HFA;VENTOLIN HFA) 108 (90 Base) MCG/ACT inhaler; Inhale 1-2 puffs into the lungs every 6 (six) hours as needed for wheezing or shortness of breath.  Dispense: 18 g; Refill: 3 - mometasone-formoterol (DULERA) 200-5 MCG/ACT AERO; Inhale 2 puffs into the lungs 2 (two) times daily.  Dispense: 13 g; Refill: 3 - montelukast (SINGULAIR) 10 MG tablet; Take 1 tablet (10 mg total) by mouth at bedtime.  Dispense: 90 tablet; Refill: 3  5. Allergic rhinitis, unspecified allergic rhinitis type Added on claritin.   - loratadine (CLARITIN) 10 MG tablet; Take 1 tablet (10 mg total) by mouth daily.  Dispense: 90 tablet; Refill: 3

## 2016-01-25 ENCOUNTER — Telehealth: Payer: Self-pay

## 2016-01-25 DIAGNOSIS — F988 Other specified behavioral and emotional disorders with onset usually occurring in childhood and adolescence: Secondary | ICD-10-CM

## 2016-01-25 NOTE — Telephone Encounter (Signed)
Pt needs a refill on his amphetamine-dextroamphetamine (ADDERALL) 20 MG tablet [[id number]][[id number]]. CB # [phone]

## 2016-01-27 NOTE — Telephone Encounter (Signed)
Dr Sherley BoundsSundaram has been Rxing this for pt. Dr Merla Richesoolittle hasn't seen him or Rxd this for him since 07/2014. Pt will need an OV first to est with new provider since Dr Merla Richesoolittle is retiring. We can let him know when we call him back about daughter's RF.

## 2016-01-28 NOTE — Telephone Encounter (Signed)
Pt's wife, Lyla SonCarrie, picked up daughter's Rx and asked that this message be sent to Maralyn SagoSarah to see if she will RF this for pt.

## 2016-01-30 DIAGNOSIS — H5213 Myopia, bilateral: Secondary | ICD-10-CM | POA: Diagnosis not present

## 2016-01-30 MED ORDER — AMPHETAMINE-DEXTROAMPHETAMINE 20 MG PO TABS: 20.0000 mg | ORAL_TABLET | Freq: Two times a day (BID) | ORAL | Status: DC

## 2016-01-30 NOTE — Telephone Encounter (Signed)
Done

## 2016-01-30 NOTE — Telephone Encounter (Signed)
Maggie FontSarah, Carrie called this morning and stated that Shane [name] is completely out of his amphetamine-dextroamphetamine (ADDERALL) 20 MG. She said she[place] in [place] today and wanted to swing by to pick it up so he can have it for Monday.  Best callback number is [phone].

## 2016-02-24 ENCOUNTER — Ambulatory Visit (INDEPENDENT_AMBULATORY_CARE_PROVIDER_SITE_OTHER): Payer: 59 | Admitting: Physician Assistant

## 2016-02-24 VITALS — BP 110/80 | HR 95 | Temp 98.0°F | Resp 16 | Ht 73.0 in | Wt 214.0 lb

## 2016-02-24 DIAGNOSIS — F909 Attention-deficit hyperactivity disorder, unspecified type: Secondary | ICD-10-CM | POA: Diagnosis not present

## 2016-02-24 DIAGNOSIS — L0201 Cutaneous abscess of face: Secondary | ICD-10-CM | POA: Diagnosis not present

## 2016-02-24 DIAGNOSIS — F988 Other specified behavioral and emotional disorders with onset usually occurring in childhood and adolescence: Secondary | ICD-10-CM

## 2016-02-24 MED ORDER — AMPHETAMINE-DEXTROAMPHETAMINE 20 MG PO TABS: 20.0000 mg | ORAL_TABLET | Freq: Two times a day (BID) | ORAL | Status: DC

## 2016-02-24 MED ORDER — DOXYCYCLINE HYCLATE 100 MG PO TABS: 100.0000 mg | ORAL_TABLET | Freq: Two times a day (BID) | ORAL | Status: DC

## 2016-02-24 NOTE — Progress Notes (Signed)
   Shane ChafeMichael S [name]  MRN: [id number] DOB: [birth year]  Subjective:  Pt presents to clinic for medication refills from Adderall.  His current dose is working well.  He had a small place on his right cheek yesterday and went to bed and then he woke up this am with a larger red painful area - He had some left over Doxy and he started it last night.  The area is painful and swollen and he feels like it has gotten worse as the day has gone on.  He has no fever or chills and he only has pain at the local area.  Patient Active Problem List   Diagnosis Date Noted  . Mild persistent asthma without complication 10/21/2015  . Right ankle sprain 07/21/2015  . Restless sleeper 03/17/2015  . Achilles tendinitis of both lower extremities 03/17/2015  . Muscle pain 03/17/2015  . Anxiety 12/18/2013  . H/O ETOH abuse 12/18/2013  . ADD (attention deficit disorder) 04/07/2013  . High cholesterol 12/17/2012    Current Outpatient Prescriptions on File Prior to Visit  Medication Sig Dispense Refill  . albuterol (PROVENTIL HFA;VENTOLIN HFA) 108 (90 Base) MCG/ACT inhaler Inhale 1-2 puffs into the lungs every 6 (six) hours as needed for wheezing or shortness of breath. 18 g 3  . buPROPion (WELLBUTRIN XL) 300 MG 24 hr tablet Take 1 tablet (300 mg total) by mouth daily. 90 tablet 2  . cyclobenzaprine (FLEXERIL) 10 MG tablet Take 1 tablet (10 mg total) by mouth at bedtime as needed for muscle spasms. 90 tablet 3  . loratadine (CLARITIN) 10 MG tablet Take 1 tablet (10 mg total) by mouth daily. 90 tablet 3  . mometasone-formoterol (DULERA) 200-5 MCG/ACT AERO Inhale 2 puffs into the lungs 2 (two) times daily. 13 g 3  . montelukast (SINGULAIR) 10 MG tablet Take 1 tablet (10 mg total) by mouth at bedtime. 90 tablet 3   No current facility-administered medications on file prior to visit.    No Known Allergies  Review of Systems  Constitutional: Negative for fever and chills.  Skin: Positive for wound.    Objective:  BP 110/80 mmHg  Pulse 95  Temp(Src) 98 F (36.7 C) (Oral)  Resp 16  Ht 6\' 1"  (1.854 m)  Wt 214 lb (97.07 kg)  BMI 28.24 kg/m2  SpO2 99%  Physical Exam  Constitutional: He is oriented to person, place, and time and well-developed, well-nourished, and in no distress.  HENT:  Head: Normocephalic and atraumatic.    Right Ear: External ear normal.  Left Ear: External ear normal.  Eyes: Conjunctivae are normal.  Neck: Normal range of motion.  Pulmonary/Chest: Effort normal.  Neurological: He is alert and oriented to person, place, and time. Gait normal.  Skin: Skin is warm and dry.  Psychiatric: Mood, memory, affect and judgment normal.    Assessment and Plan :  ADD (attention deficit disorder) - Plan: amphetamine-dextroamphetamine (ADDERALL) 20 MG tablet, amphetamine-dextroamphetamine (ADDERALL) 20 MG tablet, amphetamine-dextroamphetamine (ADDERALL) 20 MG tablet  Facial abscess - Plan: doxycycline (VIBRA-TABS) 100 MG tablet - continue doxy - start warm compresses - if worse tomorrow recheck and if no better in 48h recheck -  Benny LennertSarah Weber PA-C  [facility] 02/24/2016 6:37 PM

## 2016-02-24 NOTE — Patient Instructions (Signed)
     IF you received an x-ray today, you will receive an invoice from [facility]. Please contact [facility] at [phone] with questions or concerns regarding your invoice.   IF you received labwork today, you will receive an invoice from [facility]. Please contact [facility] at [phone] with questions or concerns regarding your invoice.   Our billing staff will not be able to assist you with questions regarding bills from these companies.  You will be contacted with the lab results as soon as they are available. The fastest way to get your results is to activate your My Chart account. Instructions are located on the last page of this paperwork. If you have not heard from us regarding the results in 2 weeks, please contact this office.      

## 2016-05-29 ENCOUNTER — Ambulatory Visit (INDEPENDENT_AMBULATORY_CARE_PROVIDER_SITE_OTHER): Payer: 59 | Admitting: Physician Assistant

## 2016-05-29 VITALS — BP 122/92 | HR 86 | Temp 97.8°F | Resp 16 | Ht 74.0 in | Wt 208.0 lb

## 2016-05-29 DIAGNOSIS — F988 Other specified behavioral and emotional disorders with onset usually occurring in childhood and adolescence: Secondary | ICD-10-CM

## 2016-05-29 DIAGNOSIS — F909 Attention-deficit hyperactivity disorder, unspecified type: Secondary | ICD-10-CM

## 2016-05-29 DIAGNOSIS — Z130 Encounter for screening for diseases of the blood and blood-forming organs and certain disorders involving the immune mechanism: Secondary | ICD-10-CM

## 2016-05-29 DIAGNOSIS — Z8042 Family history of malignant neoplasm of prostate: Secondary | ICD-10-CM | POA: Diagnosis not present

## 2016-05-29 DIAGNOSIS — Z125 Encounter for screening for malignant neoplasm of prostate: Secondary | ICD-10-CM

## 2016-05-29 DIAGNOSIS — R3912 Poor urinary stream: Secondary | ICD-10-CM

## 2016-05-29 DIAGNOSIS — Z0000 Encounter for general adult medical examination without abnormal findings: Secondary | ICD-10-CM | POA: Diagnosis not present

## 2016-05-29 DIAGNOSIS — Z13220 Encounter for screening for lipoid disorders: Secondary | ICD-10-CM

## 2016-05-29 DIAGNOSIS — Z13228 Encounter for screening for other metabolic disorders: Secondary | ICD-10-CM

## 2016-05-29 LAB — CBC WITH DIFFERENTIAL/PLATELET
BASOS PCT: 0 %
Basophils Absolute: 0 cells/uL (ref 0–200)
EOS PCT: 3 %
Eosinophils Absolute: 159 cells/uL (ref 15–500)
HCT: 49.3 % (ref 38.5–50.0)
HEMOGLOBIN: 17.3 g/dL — AB (ref 13.2–17.1)
LYMPHS ABS: 1590 {cells}/uL (ref 850–3900)
Lymphocytes Relative: 30 %
MCH: 32 pg (ref 27.0–33.0)
MCHC: 35.1 g/dL (ref 32.0–36.0)
MCV: 91.1 fL (ref 80.0–100.0)
MPV: 10.8 fL (ref 7.5–12.5)
Monocytes Absolute: 424 cells/uL (ref 200–950)
Monocytes Relative: 8 %
NEUTROS ABS: 3127 {cells}/uL (ref 1500–7800)
NEUTROS PCT: 59 %
Platelets: 310 10*3/uL (ref 140–400)
RBC: 5.41 MIL/uL (ref 4.20–5.80)
RDW: 12.9 % (ref 11.0–15.0)
WBC: 5.3 10*3/uL (ref 3.8–10.8)

## 2016-05-29 LAB — COMPLETE METABOLIC PANEL WITH GFR
ALBUMIN: 4.7 g/dL (ref 3.6–5.1)
ALT: 31 U/L (ref 9–46)
AST: 18 U/L (ref 10–40)
Alkaline Phosphatase: 103 U/L (ref 40–115)
BUN: 12 mg/dL (ref 7–25)
CHLORIDE: 103 mmol/L (ref 98–110)
CO2: 26 mmol/L (ref 20–31)
Calcium: 9.8 mg/dL (ref 8.6–10.3)
Creat: 1.08 mg/dL (ref 0.60–1.35)
GFR, Est African American: >89 mL/min (ref >=60)
GFR, Est Non African American: 84 mL/min (ref >=60)
GLUCOSE: 105 mg/dL — AB (ref 65–99)
POTASSIUM: 4.5 mmol/L (ref 3.5–5.3)
SODIUM: 140 mmol/L (ref 135–146)
Total Bilirubin: 0.6 mg/dL (ref 0.2–1.2)
Total Protein: 7.6 g/dL (ref 6.1–8.1)

## 2016-05-29 LAB — LIPID PANEL
CHOL/HDL RATIO: 4.6 ratio (ref <=5.0)
Cholesterol: 210 mg/dL — ABNORMAL HIGH (ref 125–200)
HDL: 46 mg/dL (ref >=40)
LDL Cholesterol: 142 mg/dL — ABNORMAL HIGH (ref <130)
Triglycerides: 108 mg/dL (ref <150)
VLDL: 22 mg/dL (ref <30)

## 2016-05-29 LAB — PSA: PSA: 1 ng/mL (ref <=4.0)

## 2016-05-29 MED ORDER — AMPHETAMINE-DEXTROAMPHETAMINE 20 MG PO TABS: 20.0000 mg | ORAL_TABLET | Freq: Two times a day (BID) | ORAL | 0 refills | Status: DC

## 2016-05-29 NOTE — Progress Notes (Signed)
Shane [name]  MRN: [id number] DOB: 12-13-1970  Subjective:  Pt presents to clinic for a CPE.  His wife is concerned because his father was diagnosed with prostate cancer at early 5450s and she knows he should be be screened 10 years before that.  He is having some urinary symptoms - urine stream weakness but seems to be related to times of days where he does not have the urge to urinate but rather he thinks he should to make sure his bladder is empty.  Otherwise he is doing really good.  Last dental exam:  Every 6 months Last vision exam: wear contacts - goes yearly  Vaccinations      Tetanus - UTD - about 5 years ago - got it at the same time as his [employer] --   Patient Active Problem List   Diagnosis Date Noted  . Mild persistent asthma without complication 10/21/2015  . Right ankle sprain 07/21/2015  . Restless sleeper 03/17/2015  . Achilles tendinitis of both lower extremities 03/17/2015  . Muscle pain 03/17/2015  . Anxiety 12/18/2013  . H/O ETOH abuse 12/18/2013  . ADD (attention deficit disorder) 04/07/2013  . High cholesterol 12/17/2012    Current Outpatient Prescriptions on File Prior to Visit  Medication Sig Dispense Refill  . buPROPion (WELLBUTRIN XL) 300 MG 24 hr tablet Take 1 tablet (300 mg total) by mouth daily. 90 tablet 2  . cyclobenzaprine (FLEXERIL) 10 MG tablet Take 1 tablet (10 mg total) by mouth at bedtime as needed for muscle spasms. 90 tablet 3  . loratadine (CLARITIN) 10 MG tablet Take 1 tablet (10 mg total) by mouth daily. 90 tablet 3  . albuterol (PROVENTIL HFA;VENTOLIN HFA) 108 (90 Base) MCG/ACT inhaler Inhale 1-2 puffs into the lungs every 6 (six) hours as needed for wheezing or shortness of breath. 18 g 3   No current facility-administered medications on file prior to visit.     No Known Allergies  Social History   Social History  . Marital status: Married    Spouse name: Shane [name]  . Number of children: 2  . Years of education:  14   Occupational History  .      [employer]   Social History Main Topics  . Smoking status: Current Some Day Smoker    Packs/day: 0.25  . Smokeless tobacco: Never Used  . Alcohol use No     Comment: no alcohol since 12/16/2012  . Drug use: No  . Sexual activity: Yes    Partners: Female   Other Topics Concern  . None   Social History Narrative   Lives with his wife and their two daughters   Rebuild induction coils for work    Past Surgical History:  Procedure Laterality Date  . TENDON REPAIR Right 09/15/2015   Procedure: RIGHT FOREARM EXPLORATION AND CLOSURE OF LACERATION, TENDON REPAIR;  Surgeon: Bradly BienenstockFred Ortmann, MD;  Location: [facility];  Service: Orthopedics;  Laterality: Right;    Family History  Problem Relation Age of Onset  . Cancer Father     prostate cancer in early 2750s    Review of Systems  Constitutional: Negative.   HENT: Negative.   Eyes: Negative.   Respiratory: Negative.   Cardiovascular: Negative.   Gastrointestinal: Negative.   Endocrine: Negative.   Genitourinary: Positive for difficulty urinating (waeakned stream, diffliculty starting stream). Negative for decreased urine volume, hematuria, testicular pain and urgency.  Musculoskeletal: Negative.   Skin: Negative.   Allergic/Immunologic: Negative.  Neurological: Negative.   Hematological: Negative.   Psychiatric/Behavioral: Positive for sleep disturbance (slight jerking of his torsr during the night while sleeping - no snoring or trouble with breathing - wakes his wife up - not related to change in activity level that he knows of).   Objective:  BP (!) 122/92   Pulse 86   Temp 97.8 F (36.6 C)   Resp 16   Ht 6\' 2"  (1.88 m)   Wt 208 lb (94.3 kg)   SpO2 99%   BMI 26.71 kg/m   Physical Exam  Constitutional: He is oriented to person, place, and time and well-developed, well-nourished, and in no distress.  HENT:  Head: Normocephalic and atraumatic.  Right Ear: Hearing, tympanic  membrane, external ear and ear canal normal.  Left Ear: Hearing, tympanic membrane, external ear and ear canal normal.  Nose: Nose normal.  Mouth/Throat: Uvula is midline, oropharynx is clear and moist and mucous membranes are normal.  Eyes: Conjunctivae and EOM are normal. Pupils are equal, round, and reactive to light.  Neck: Trachea normal and normal range of motion. Neck supple. No thyroid mass and no thyromegaly present.  Cardiovascular: Normal rate, regular rhythm and normal heart sounds.   No murmur heard. Pulmonary/Chest: Effort normal and breath sounds normal.  Abdominal: Soft. Bowel sounds are normal. Hernia confirmed negative in the right inguinal area and confirmed negative in the left inguinal area.  Genitourinary: Testes/scrotum normal and penis normal. Prostate is tender. Prostate is not enlarged.  Musculoskeletal: Normal range of motion.  Neurological: He is alert and oriented to person, place, and time. Gait normal.  Skin: Skin is warm and dry.  Psychiatric: Mood, memory, affect and judgment normal.    Visual Acuity Screening   Right eye Left eye Both eyes  Without correction:     With correction: 20/20 20/20 20/15     Assessment and Plan :  Annual physical exam - anticipatory guidance  ADD (attention deficit disorder) - Plan: ok to fill for the next 6 months - controlled substance contract signed and scanned into chart-  amphetamine-dextroamphetamine (ADDERALL) 20 MG tablet, amphetamine-dextroamphetamine (ADDERALL) 20 MG tablet, amphetamine-dextroamphetamine (ADDERALL) 20 MG tablet  Weak urine stream - Plan: PSA  Family history of prostate cancer in father  Screening for prostate cancer - Plan: PSA  Screening cholesterol level - Plan: Lipid panel  Screening for metabolic disorder - Plan: COMPLETE METABOLIC PANEL WITH GFR  Screening for deficiency anemia - Plan: CBC with Differential/Platelet  Benny Lennert PA-C  [facility] 05/29/2016 10:21 AM

## 2016-05-29 NOTE — Patient Instructions (Addendum)

## 2016-06-02 ENCOUNTER — Encounter: Payer: Self-pay | Admitting: Physician Assistant

## 2016-06-30 ENCOUNTER — Encounter: Payer: Self-pay | Admitting: Physician Assistant

## 2016-06-30 DIAGNOSIS — F419 Anxiety disorder, unspecified: Secondary | ICD-10-CM

## 2016-07-01 NOTE — Telephone Encounter (Signed)
Sarah, I see that you Rxd this for pt in 2015 for a year, and then another provider Rxd for the following year. I don't see depression/anxiety specifically addressed at pt's check up last month. OK to RF?

## 2016-07-05 MED ORDER — BUPROPION HCL ER (XL) 300 MG PO TB24: 300.0000 mg | ORAL_TABLET | Freq: Every day | ORAL | 3 refills | Status: DC

## 2016-07-05 NOTE — Telephone Encounter (Signed)
Done

## 2016-07-09 ENCOUNTER — Encounter: Payer: Self-pay | Admitting: Family Medicine

## 2016-07-09 ENCOUNTER — Ambulatory Visit (INDEPENDENT_AMBULATORY_CARE_PROVIDER_SITE_OTHER): Payer: 59 | Admitting: Family Medicine

## 2016-07-09 VITALS — BP 144/96 | HR 98 | Temp 98.3°F | Ht 73.5 in | Wt 211.0 lb

## 2016-07-09 DIAGNOSIS — F419 Anxiety disorder, unspecified: Secondary | ICD-10-CM | POA: Insufficient documentation

## 2016-07-09 DIAGNOSIS — F329 Major depressive disorder, single episode, unspecified: Secondary | ICD-10-CM | POA: Insufficient documentation

## 2016-07-09 DIAGNOSIS — E7800 Pure hypercholesterolemia, unspecified: Secondary | ICD-10-CM

## 2016-07-09 DIAGNOSIS — F1011 Alcohol abuse, in remission: Secondary | ICD-10-CM

## 2016-07-09 DIAGNOSIS — F32A Depression, unspecified: Secondary | ICD-10-CM | POA: Insufficient documentation

## 2016-07-09 DIAGNOSIS — Z87898 Personal history of other specified conditions: Secondary | ICD-10-CM

## 2016-07-09 DIAGNOSIS — F988 Other specified behavioral and emotional disorders with onset usually occurring in childhood and adolescence: Secondary | ICD-10-CM

## 2016-07-09 DIAGNOSIS — J4530 Mild persistent asthma, uncomplicated: Secondary | ICD-10-CM | POA: Diagnosis not present

## 2016-07-09 DIAGNOSIS — F325 Major depressive disorder, single episode, in full remission: Secondary | ICD-10-CM | POA: Diagnosis not present

## 2016-07-09 NOTE — Progress Notes (Signed)
Subjective:  Patient ID: Shane [name], male    DOB: March 08, 1971  Age: 45 y.o. MRN: [id number]  CC: Establish care  HPI BERNHARD KOSKINEN is a 45 y.o. male presents to the clinic today to establish care.  Issues/concerns are below.  ADHD  Has had an evaluation in the past.  Doing well on Adderall.  Does not need refill.  HLD  Stable.  Not on treatment.  ASCVD risk score 7%.  Depression  Stable.  Doing well on Wellbutrin.  Asthma  Stable.  Rare use of Albuterol inhaler.  PMH, Surgical Hx, Family Hx, Social History reviewed and updated as below.  Past Medical History:  Diagnosis Date  . ADD (attention deficit disorder)   . Alcohol abuse    History of.  . Depression   . Hyperlipidemia    Past Surgical History:  Procedure Laterality Date  . TENDON REPAIR Right 09/15/2015   Procedure: RIGHT FOREARM EXPLORATION AND CLOSURE OF LACERATION, TENDON REPAIR;  Surgeon: Bradly Bienenstock, MD;  Location: [facility] OR;  Service: Orthopedics;  Laterality: Right;   Family History  Problem Relation Age of Onset  . Prostate cancer Father   . Hyperlipidemia Father   . Hypertension Father   . Hypertension Cousin    Social History  Substance Use Topics  . Smoking status: Current Some Day Smoker    Packs/day: 0.25  . Smokeless tobacco: Never Used  . Alcohol use No     Comment: no alcohol since 12/16/2012   Review of Systems  Psychiatric/Behavioral: Positive for decreased concentration.  All other systems reviewed and are negative.  Objective:   Today's Vitals: BP (!) 144/96 (BP Location: Right Arm, Patient Position: Sitting, Cuff Size: Normal)   Pulse 98   Temp 98.3 F (36.8 C) (Oral)   Ht 6' 1.5" (1.867 m)   Wt 211 lb (95.7 kg)   SpO2 98%   BMI 27.46 kg/m   Physical Exam  Constitutional: He is oriented to person, place, and time. He appears well-developed and well-nourished. No distress.  HENT:  Head: Normocephalic and atraumatic.  Nose: Nose normal.    Mouth/Throat: Oropharynx is clear and moist. No oropharyngeal exudate.  Normal TM's bilaterally.   Eyes: Conjunctivae are normal. No scleral icterus.  Neck: Neck supple. No thyromegaly present.  Cardiovascular: Normal rate and regular rhythm.   No murmur heard. Pulmonary/Chest: Effort normal and breath sounds normal. He has no wheezes. He has no rales.  Abdominal: Soft. He exhibits no distension. There is no tenderness. There is no rebound and no guarding.  Musculoskeletal: Normal range of motion. He exhibits no edema.  Lymphadenopathy:    He has no cervical adenopathy.  Neurological: He is alert and oriented to person, place, and time.  Skin: Skin is warm and dry. No rash noted.  Psychiatric: He has a normal mood and affect.  Vitals reviewed.  Assessment & Plan:   Problem List Items Addressed This Visit    ADD (attention deficit disorder)    Stable. Doing well on Adderall.      Depression    Doing well at this time on Wellbutrin. Will continue.      H/O ETOH abuse    Patient drinking occasionally with wife. Advised him to not have any alcohol given his history. Patient in agreement.      HLD (hyperlipidemia)    Stable. ASCVD risk score 7%. No indication for statin at this time. Diet and exercise.      Mild persistent asthma without  complication    Stable. Continue albuterol as needed.       Other Visit Diagnoses   None.     Outpatient Encounter Prescriptions as of 07/09/2016  Medication Sig  . albuterol (PROVENTIL HFA;VENTOLIN HFA) 108 (90 Base) MCG/ACT inhaler Inhale 1-2 puffs into the lungs every 6 (six) hours as needed for wheezing or shortness of breath.  . amphetamine-dextroamphetamine (ADDERALL) 20 MG tablet Take 1 tablet (20 mg total) by mouth 2 (two) times daily.  Marland Kitchen. buPROPion (WELLBUTRIN XL) 300 MG 24 hr tablet Take 1 tablet (300 mg total) by mouth daily.  . cyclobenzaprine (FLEXERIL) 10 MG tablet Take 1 tablet (10 mg total) by mouth at bedtime as  needed for muscle spasms.  Marland Kitchen. loratadine (CLARITIN) 10 MG tablet Take 1 tablet (10 mg total) by mouth daily.  . [DISCONTINUED] amphetamine-dextroamphetamine (ADDERALL) 20 MG tablet Take 1 tablet (20 mg total) by mouth 2 (two) times daily.  . [DISCONTINUED] amphetamine-dextroamphetamine (ADDERALL) 20 MG tablet Take 1 tablet (20 mg total) by mouth 2 (two) times daily.   No facility-administered encounter medications on file as of 07/09/2016.     Follow-up: Return in about 6 months (around 01/07/2017).  Everlene OtherJayce Jentry Warnell DO [facility] [place]

## 2016-07-09 NOTE — Assessment & Plan Note (Signed)
Stable. Doing well on Adderall.

## 2016-07-09 NOTE — Assessment & Plan Note (Signed)
Doing well at this time on Wellbutrin. Will continue.

## 2016-07-09 NOTE — Progress Notes (Signed)
Pre visit review using our clinic review tool, if applicable. No additional management support is needed unless otherwise documented below in the visit note. 

## 2016-07-09 NOTE — Patient Instructions (Signed)
Continue your meds.  Follow up in 6 months.  Call regarding refills.  Take care  Dr. Adriana Simasook

## 2016-07-09 NOTE — Assessment & Plan Note (Signed)
Stable. Continue albuterol as needed.  

## 2016-07-09 NOTE — Assessment & Plan Note (Signed)
Patient drinking occasionally with wife. Advised him to not have any alcohol given his history. Patient in agreement.

## 2016-07-09 NOTE — Assessment & Plan Note (Signed)
Stable. ASCVD risk score 7%. No indication for statin at this time. Diet and exercise.

## 2016-08-31 ENCOUNTER — Telehealth: Payer: Self-pay | Admitting: *Deleted

## 2016-08-31 ENCOUNTER — Other Ambulatory Visit: Payer: Self-pay | Admitting: Family Medicine

## 2016-08-31 DIAGNOSIS — F988 Other specified behavioral and emotional disorders with onset usually occurring in childhood and adolescence: Secondary | ICD-10-CM

## 2016-08-31 MED ORDER — AMPHETAMINE-DEXTROAMPHETAMINE 20 MG PO TABS: 20.0000 mg | ORAL_TABLET | Freq: Two times a day (BID) | ORAL | 0 refills | Status: DC

## 2016-08-31 NOTE — Telephone Encounter (Signed)
Please advise on refill.

## 2016-08-31 NOTE — Telephone Encounter (Signed)
Pt notified rx is ready for pick up

## 2016-08-31 NOTE — Telephone Encounter (Signed)
Refilled

## 2016-08-31 NOTE — Telephone Encounter (Signed)
Pt requested to have a medication refill adderall Pt contact [phone]

## 2016-10-05 ENCOUNTER — Other Ambulatory Visit: Payer: Self-pay | Admitting: Family Medicine

## 2016-10-05 DIAGNOSIS — F988 Other specified behavioral and emotional disorders with onset usually occurring in childhood and adolescence: Secondary | ICD-10-CM

## 2016-10-05 MED ORDER — AMPHETAMINE-DEXTROAMPHETAMINE 20 MG PO TABS: 20.0000 mg | ORAL_TABLET | Freq: Two times a day (BID) | ORAL | 0 refills | Status: DC

## 2016-10-05 NOTE — Telephone Encounter (Signed)
Pt is requesting to have his Adderall refilled. Phone # [phone].

## 2016-10-05 NOTE — Telephone Encounter (Signed)
Voicemail was left letting pt know RX was ready for pick up.

## 2016-10-05 NOTE — Telephone Encounter (Signed)
refilled 08/31/16. Pt last seen 07/09/16 please advise?

## 2016-10-08 ENCOUNTER — Ambulatory Visit: Payer: Self-pay | Admitting: Family Medicine

## 2016-11-08 ENCOUNTER — Other Ambulatory Visit: Payer: Self-pay | Admitting: Family Medicine

## 2016-11-08 ENCOUNTER — Encounter: Payer: Self-pay | Admitting: Family Medicine

## 2016-11-08 DIAGNOSIS — M791 Myalgia, unspecified site: Secondary | ICD-10-CM

## 2016-11-08 DIAGNOSIS — F988 Other specified behavioral and emotional disorders with onset usually occurring in childhood and adolescence: Secondary | ICD-10-CM

## 2016-11-08 MED ORDER — CYCLOBENZAPRINE HCL 10 MG PO TABS: 10.0000 mg | ORAL_TABLET | Freq: Every evening | ORAL | 3 refills | Status: DC | PRN

## 2016-11-08 MED ORDER — AMPHETAMINE-DEXTROAMPHETAMINE 20 MG PO TABS: 20.0000 mg | ORAL_TABLET | Freq: Two times a day (BID) | ORAL | 0 refills | Status: DC

## 2016-11-08 NOTE — Telephone Encounter (Signed)
Adderall refilled :10/05/16  Flexeril refilled: 10/21/15 filled by Dr. Sherley BoundsSundaram.  pt last seen 07/09/16

## 2016-11-08 NOTE — Telephone Encounter (Signed)
A mychart message sent to pt letting him know rx was ready here at the office.

## 2016-12-09 ENCOUNTER — Other Ambulatory Visit: Payer: Self-pay | Admitting: Family Medicine

## 2016-12-09 ENCOUNTER — Encounter: Payer: Self-pay | Admitting: Family Medicine

## 2016-12-09 DIAGNOSIS — F988 Other specified behavioral and emotional disorders with onset usually occurring in childhood and adolescence: Secondary | ICD-10-CM

## 2016-12-09 MED ORDER — AMPHETAMINE-DEXTROAMPHETAMINE 20 MG PO TABS: 20.0000 mg | ORAL_TABLET | Freq: Two times a day (BID) | ORAL | 0 refills | Status: DC

## 2016-12-09 NOTE — Telephone Encounter (Signed)
Refilled: 11/08/16 Last OV: 07/09/16 Future OV: 01/14/17  Please advise?

## 2016-12-09 NOTE — Telephone Encounter (Signed)
LVM letting pt know rx was ready for pick up at front desk.

## 2017-01-11 ENCOUNTER — Other Ambulatory Visit: Payer: Self-pay | Admitting: Family Medicine

## 2017-01-11 ENCOUNTER — Encounter: Payer: Self-pay | Admitting: Family Medicine

## 2017-01-11 DIAGNOSIS — F988 Other specified behavioral and emotional disorders with onset usually occurring in childhood and adolescence: Secondary | ICD-10-CM

## 2017-01-11 MED ORDER — AMPHETAMINE-DEXTROAMPHETAMINE 20 MG PO TABS: 20.0000 mg | ORAL_TABLET | Freq: Two times a day (BID) | ORAL | 0 refills | Status: DC

## 2017-01-11 NOTE — Telephone Encounter (Signed)
Last refill on Adderall 12/09/16 and last OV 10/17 has an appointment coming up with PCP 01/14/17 ok to fill?

## 2017-01-14 ENCOUNTER — Ambulatory Visit (INDEPENDENT_AMBULATORY_CARE_PROVIDER_SITE_OTHER): Payer: 59 | Admitting: Family Medicine

## 2017-01-14 ENCOUNTER — Encounter: Payer: Self-pay | Admitting: Family Medicine

## 2017-01-14 DIAGNOSIS — F988 Other specified behavioral and emotional disorders with onset usually occurring in childhood and adolescence: Secondary | ICD-10-CM

## 2017-01-14 DIAGNOSIS — F418 Other specified anxiety disorders: Secondary | ICD-10-CM | POA: Diagnosis not present

## 2017-01-14 DIAGNOSIS — F3342 Major depressive disorder, recurrent, in full remission: Secondary | ICD-10-CM | POA: Insufficient documentation

## 2017-01-14 DIAGNOSIS — F329 Major depressive disorder, single episode, unspecified: Secondary | ICD-10-CM

## 2017-01-14 DIAGNOSIS — F331 Major depressive disorder, recurrent, moderate: Secondary | ICD-10-CM | POA: Insufficient documentation

## 2017-01-14 DIAGNOSIS — F419 Anxiety disorder, unspecified: Secondary | ICD-10-CM

## 2017-01-14 DIAGNOSIS — F32A Depression, unspecified: Secondary | ICD-10-CM

## 2017-01-14 NOTE — Patient Instructions (Signed)
Continue off the Wellbutrin.  Continue the other medications.  Take care  Dr. Adriana Simas

## 2017-01-14 NOTE — Progress Notes (Signed)
   Subjective:  Patient ID: Shane [name], male    DOB: [birth year]  Age: 46 y.o. MRN: [id number]  CC: Follow up  HPI:  46 year old male presents for follow-up.  ADD   Stable on Adderall.  Anxiety and depression  Currently stable.  Patient has been out of his medication and did not resume as of the beginning of this week.  He would like to discuss whether he needs to restart for try different medication.  He states that this medication is expensive for him.  Social Hx   Social History   Social History  . Marital status: Married    Spouse name: Akram Kissick  . Number of children: 2  . Years of education: 14   Occupational History  .      [employer]   Social History Main Topics  . Smoking status: Current Some Day Smoker    Packs/day: 0.25  . Smokeless tobacco: Never Used  . Alcohol use No     Comment: no alcohol since 12/16/2012  . Drug use: No  . Sexual activity: Yes    Partners: Female   Other Topics Concern  . None   Social History Narrative   Lives with his wife and their two daughters   Rebuild induction coils for work    Review of Systems  Constitutional: Negative.   Psychiatric/Behavioral: Positive for decreased concentration.   Objective:  BP (!) 142/88   Pulse 89   Temp 98.6 F (37 C) (Oral)   Wt 211 lb 8 oz (95.9 kg)   SpO2 98%   BMI 27.53 kg/m   BP/Weight 01/14/2017 07/09/2016 05/29/2016  Systolic BP 142 144 122  Diastolic BP 88 96 92  Wt. (Lbs) 211.5 211 208  BMI 27.53 27.46 26.71   Physical Exam  Constitutional: He is oriented to person, place, and time. He appears well-developed. No distress.  Cardiovascular: Normal rate and regular rhythm.   Pulmonary/Chest: Effort normal and breath sounds normal.  Neurological: He is alert and oriented to person, place, and time.  Psychiatric: He has a normal mood and affect.  Vitals reviewed.   Lab Results  Component Value Date   WBC 5.3 05/29/2016   HGB 17.3 (H)  05/29/2016   HCT 49.3 05/29/2016   PLT 310 05/29/2016   GLUCOSE 105 (H) 05/29/2016   CHOL 210 (H) 05/29/2016   TRIG 108 05/29/2016   HDL 46 05/29/2016   LDLCALC 142 (H) 05/29/2016   ALT 31 05/29/2016   AST 18 05/29/2016   NA 140 05/29/2016   K 4.5 05/29/2016   CL 103 05/29/2016   CREATININE 1.08 05/29/2016   BUN 12 05/29/2016   CO2 26 05/29/2016   TSH 1.995 12/17/2012   PSA 1.0 05/29/2016   INR 1.01 09/15/2015    Assessment & Plan:   Problem List Items Addressed This Visit    Anxiety and depression    Stable currently. Off wellbutrin.  We'll continue to be off the medication and see how he does.      ADD (attention deficit disorder)    Stable. Continue Adderall.         Follow-up: 6 months  Jaeleah Smyser Adriana Simas DO [facility]

## 2017-01-14 NOTE — Assessment & Plan Note (Signed)
Stable.  Continue Adderall. 

## 2017-01-14 NOTE — Progress Notes (Signed)
Pre visit review using our clinic review tool, if applicable. No additional management support is needed unless otherwise documented below in the visit note. 

## 2017-01-14 NOTE — Assessment & Plan Note (Signed)
Stable currently. Off wellbutrin.  We'll continue to be off the medication and see how he does.

## 2017-02-08 ENCOUNTER — Encounter: Payer: Self-pay | Admitting: Family Medicine

## 2017-02-08 ENCOUNTER — Other Ambulatory Visit: Payer: Self-pay | Admitting: Family Medicine

## 2017-02-08 DIAGNOSIS — F988 Other specified behavioral and emotional disorders with onset usually occurring in childhood and adolescence: Secondary | ICD-10-CM

## 2017-02-08 MED ORDER — AMPHETAMINE-DEXTROAMPHETAMINE 20 MG PO TABS: 20.0000 mg | ORAL_TABLET | Freq: Every day | ORAL | 0 refills | Status: DC

## 2017-02-08 MED ORDER — AMPHETAMINE-DEXTROAMPHETAMINE 20 MG PO TABS: 20.0000 mg | ORAL_TABLET | Freq: Two times a day (BID) | ORAL | 0 refills | Status: DC

## 2017-02-08 MED ORDER — ADDERALL 20 MG PO TABS: 20.0000 mg | ORAL_TABLET | Freq: Every day | ORAL | 0 refills | Status: DC

## 2017-02-10 ENCOUNTER — Other Ambulatory Visit: Payer: Self-pay | Admitting: Family Medicine

## 2017-02-10 ENCOUNTER — Telehealth: Payer: Self-pay

## 2017-02-10 DIAGNOSIS — F988 Other specified behavioral and emotional disorders with onset usually occurring in childhood and adolescence: Secondary | ICD-10-CM

## 2017-02-10 MED ORDER — ADDERALL 20 MG PO TABS: 20.0000 mg | ORAL_TABLET | Freq: Two times a day (BID) | ORAL | 0 refills | Status: DC

## 2017-02-10 MED ORDER — AMPHETAMINE-DEXTROAMPHETAMINE 20 MG PO TABS: 20.0000 mg | ORAL_TABLET | Freq: Two times a day (BID) | ORAL | 0 refills | Status: DC

## 2017-02-10 NOTE — Telephone Encounter (Signed)
Patients wife advised . Advised to bring June and July scripts back to be destroyed.

## 2017-02-10 NOTE — Telephone Encounter (Signed)
Rx ready.

## 2017-02-10 NOTE — Telephone Encounter (Signed)
Patients wife states scripts for Adderall should be writen for twice dail for first script for 02/08/17 bid, then additional scripts were written for for once daily 7/9 .  She is going to bring back scripts for other scripts for June and July Please re--write .  Thanks.

## 2017-02-11 NOTE — Telephone Encounter (Signed)
I have received old rx and shredded old rx

## 2017-04-22 ENCOUNTER — Encounter: Payer: Self-pay | Admitting: Family Medicine

## 2017-04-22 ENCOUNTER — Ambulatory Visit (INDEPENDENT_AMBULATORY_CARE_PROVIDER_SITE_OTHER): Payer: 59 | Admitting: Family Medicine

## 2017-04-22 DIAGNOSIS — L709 Acne, unspecified: Secondary | ICD-10-CM | POA: Insufficient documentation

## 2017-04-22 DIAGNOSIS — R21 Rash and other nonspecific skin eruption: Secondary | ICD-10-CM | POA: Diagnosis not present

## 2017-04-22 MED ORDER — DOXYCYCLINE HYCLATE 100 MG PO TABS: 100.0000 mg | ORAL_TABLET | Freq: Two times a day (BID) | ORAL | 0 refills | Status: DC

## 2017-04-22 MED ORDER — MUPIROCIN 2 % EX OINT: 1.0000 "application " | TOPICAL_OINTMENT | Freq: Three times a day (TID) | CUTANEOUS | 0 refills | Status: DC

## 2017-04-22 NOTE — Assessment & Plan Note (Signed)
New problem. Certain areas appear like folliculitis, while others appear to be resolving abscesses. The exact etiology is unclear at this time. Placing on Bactroban and doxycycline. If fails to improve, will send to dermatology.

## 2017-04-22 NOTE — Patient Instructions (Signed)
Doxy + Mupirocin.  We will set up for you to see derm.  Take care  Dr. Adriana Simasook

## 2017-04-22 NOTE — Progress Notes (Signed)
Subjective:  Patient ID: Shane [name], male    DOB: 1971-05-05  Age: 46 y.o. MRN: [id number]  CC: Rash  HPI:  77 -year-old male presents with the above complaint.  Patient reports that he has had a rash for months. He has papular areas with erythema that pop up. They are located on his legs, face, scalp. Some of the lesions are painful. Some of them drain. He does not recall any new exposures or changes. No reports of any bug bites or tick bites. The areas of concern currently are his scalp and face as lesions are painful. He's been using some over-the-counter topical acne cream with some improvement. No known exacerbating factors. No other associated symptoms. No other complaints at this time.  Social Hx   Social History   Social History  . Marital status: Married    Spouse name: Slate Debroux  . Number of children: 2  . Years of education: 14   Occupational History  .      [employer]   Social History Main Topics  . Smoking status: Former Smoker    Packs/day: 0.25  . Smokeless tobacco: Never Used  . Alcohol use No     Comment: no alcohol since 12/16/2012  . Drug use: No  . Sexual activity: Yes    Partners: Female   Other Topics Concern  . None   Social History Narrative   Lives with his wife and their two daughters   Rebuild induction coils for work    Review of Systems  Constitutional: Negative.   Skin: Positive for rash.   Objective:  BP 130/82   Pulse 85   Temp 98 F (36.7 C) (Oral)   Wt 200 lb 6.4 oz (90.9 kg)   SpO2 99%   BMI 26.08 kg/m   BP/Weight 04/22/2017 01/14/2017 07/09/2016  Systolic BP 130 142 144  Diastolic BP 82 88 96  Wt. (Lbs) 200.4 211.5 211  BMI 26.08 27.53 27.46   Physical Exam  Constitutional: He is oriented to person, place, and time. He appears well-developed. No distress.  HENT:  Head: Normocephalic and atraumatic.  Eyes: Conjunctivae are normal. No scleral icterus.  Neck: Neck supple.  Pulmonary/Chest:  Effort normal. No respiratory distress.  Neurological: He is alert and oriented to person, place, and time.  No deficits.  Skin:  Legs - scattered papules with erythema.. To be predominantly centered around hair follicles.  Face - patient has several healing papules with eschar. He has a raised and painful lesion behind his left ear. Has been draining.  Psychiatric: He has a normal mood and affect. His behavior is normal. Thought content normal.  Vitals reviewed.   Lab Results  Component Value Date   WBC 5.3 05/29/2016   HGB 17.3 (H) 05/29/2016   HCT 49.3 05/29/2016   PLT 310 05/29/2016   GLUCOSE 105 (H) 05/29/2016   CHOL 210 (H) 05/29/2016   TRIG 108 05/29/2016   HDL 46 05/29/2016   LDLCALC 142 (H) 05/29/2016   ALT 31 05/29/2016   AST 18 05/29/2016   NA 140 05/29/2016   K 4.5 05/29/2016   CL 103 05/29/2016   CREATININE 1.08 05/29/2016   BUN 12 05/29/2016   CO2 26 05/29/2016   TSH 1.995 12/17/2012   PSA 1.0 05/29/2016   INR 1.01 09/15/2015    Assessment & Plan:   Problem List Items Addressed This Visit      Musculoskeletal and Integument   Rash  New problem. Certain areas appear like folliculitis, while others appear to be resolving abscesses. The exact etiology is unclear at this time. Placing on Bactroban and doxycycline. If fails to improve, will send to dermatology.        Meds ordered this encounter  Medications  . mupirocin ointment (BACTROBAN) 2 %    Sig: Place 1 application into the nose 3 (three) times daily.    Dispense:  30 g    Refill:  0  . doxycycline (VIBRA-TABS) 100 MG tablet    Sig: Take 1 tablet (100 mg total) by mouth 2 (two) times daily.    Dispense:  20 tablet    Refill:  0   Follow-up: PRN  Everlene OtherJayce Engelbert Sevin DO [facility] [place]

## 2017-05-09 ENCOUNTER — Encounter: Payer: Self-pay | Admitting: Family Medicine

## 2017-06-15 ENCOUNTER — Other Ambulatory Visit: Payer: Self-pay | Admitting: Family Medicine

## 2017-06-15 DIAGNOSIS — F988 Other specified behavioral and emotional disorders with onset usually occurring in childhood and adolescence: Secondary | ICD-10-CM

## 2017-06-15 MED ORDER — AMPHETAMINE-DEXTROAMPHETAMINE 20 MG PO TABS: 20.0000 mg | ORAL_TABLET | Freq: Two times a day (BID) | ORAL | 0 refills | Status: DC

## 2017-06-15 NOTE — Telephone Encounter (Signed)
Pt is requesting a refill on his ADDERALL 20 MG tablet

## 2017-06-15 NOTE — Telephone Encounter (Signed)
Last office visit 01/14/17, to follow up six months No office visit scheduled

## 2017-06-16 NOTE — Telephone Encounter (Signed)
Left voicemail advising script ready for pick up . Script placed up front for pick up

## 2017-07-19 ENCOUNTER — Telehealth: Payer: Self-pay | Admitting: Family Medicine

## 2017-07-19 DIAGNOSIS — F988 Other specified behavioral and emotional disorders with onset usually occurring in childhood and adolescence: Secondary | ICD-10-CM

## 2017-07-19 NOTE — Telephone Encounter (Signed)
He needs to establish with a new PCP and yes, cook gave him a 30 day and that was it. He didn't hav e a six month follow up scheduled from his appt in April.

## 2017-07-19 NOTE — Telephone Encounter (Signed)
Does this patient need an office visit before filling his Adderall. If he does not he will need an refill.

## 2017-07-20 NOTE — Telephone Encounter (Signed)
I left a message for the patient's spouse to give me a call. The patient will need an office visit before refilling his medication.

## 2017-07-22 ENCOUNTER — Ambulatory Visit: Payer: Self-pay | Admitting: Family Medicine

## 2017-07-22 MED ORDER — AMPHETAMINE-DEXTROAMPHETAMINE 20 MG PO TABS: 20.0000 mg | ORAL_TABLET | Freq: Two times a day (BID) | ORAL | 0 refills | Status: DC

## 2017-07-22 NOTE — Telephone Encounter (Signed)
Patient has a scheduled  appt with arnett on 10/25, he is completely out of this medication, can he get 14 days worth till that appt.  Thanks

## 2017-07-22 NOTE — Telephone Encounter (Signed)
One week refill provided.

## 2017-07-28 ENCOUNTER — Ambulatory Visit (INDEPENDENT_AMBULATORY_CARE_PROVIDER_SITE_OTHER): Payer: 59 | Admitting: Family

## 2017-07-28 ENCOUNTER — Encounter: Payer: Self-pay | Admitting: Family

## 2017-07-28 VITALS — BP 124/78 | HR 81 | Temp 98.1°F | Wt 208.4 lb

## 2017-07-28 DIAGNOSIS — F1011 Alcohol abuse, in remission: Secondary | ICD-10-CM

## 2017-07-28 DIAGNOSIS — F419 Anxiety disorder, unspecified: Secondary | ICD-10-CM | POA: Diagnosis not present

## 2017-07-28 DIAGNOSIS — L709 Acne, unspecified: Secondary | ICD-10-CM

## 2017-07-28 DIAGNOSIS — Z87898 Personal history of other specified conditions: Secondary | ICD-10-CM | POA: Diagnosis not present

## 2017-07-28 DIAGNOSIS — F988 Other specified behavioral and emotional disorders with onset usually occurring in childhood and adolescence: Secondary | ICD-10-CM | POA: Diagnosis not present

## 2017-07-28 DIAGNOSIS — F329 Major depressive disorder, single episode, unspecified: Secondary | ICD-10-CM | POA: Diagnosis not present

## 2017-07-28 MED ORDER — AMPHETAMINE-DEXTROAMPHETAMINE 20 MG PO TABS: 20.0000 mg | ORAL_TABLET | Freq: Two times a day (BID) | ORAL | 0 refills | Status: DC

## 2017-07-28 MED ORDER — DOXYCYCLINE HYCLATE 100 MG PO TABS: 100.0000 mg | ORAL_TABLET | Freq: Two times a day (BID) | ORAL | 0 refills | Status: DC

## 2017-07-28 MED ORDER — ADDERALL 20 MG PO TABS: 20.0000 mg | ORAL_TABLET | Freq: Two times a day (BID) | ORAL | 0 refills | Status: DC

## 2017-07-28 MED ORDER — SERTRALINE HCL 50 MG PO TABS: 50.0000 mg | ORAL_TABLET | Freq: Every day | ORAL | 3 refills | Status: DC

## 2017-07-28 NOTE — Assessment & Plan Note (Signed)
Chronic. He saw dermatology in the past has been on doxycycline. We'll try doxycycline one more  time. Advised patient if no improvement, I would like to refer him dermatology.

## 2017-07-28 NOTE — Assessment & Plan Note (Signed)
Sober since 2013

## 2017-07-28 NOTE — Assessment & Plan Note (Addendum)
Control on current regimen. New CSC.  Refilled I looked up patient on [place] Controlled Substances Reporting System and saw no activity that raised concern of inappropriate use.

## 2017-07-28 NOTE — Patient Instructions (Addendum)
Trial of zoloft at bedtime  Doxycycline for acne- if no improvement this, I would like you to see dermatology  Follow up 2 months

## 2017-07-28 NOTE — Assessment & Plan Note (Signed)
After long discussion with patient regarding anxiety and how it affects sleep. He is reluctant to decrease his afternoon dose of Adderall to 10 mg and preferred to start SSRI. Advised patient to not take Flexeril for insomnia. He is very agreeable to trial of zoloft. We will start a low-dose Zoloft. Follow-up in 2 months

## 2017-07-28 NOTE — Progress Notes (Signed)
2

## 2017-07-28 NOTE — Progress Notes (Signed)
Subjective:    Patient ID: CHA GOMILLION, male    DOB: Oct 18, 1970, 46 y.o.   MRN: [id number]  CC: KELE BARTHELEMY is a 46 y.o. male who presents today for follow up.   HPI: ADHD- diagnosed 2013 from Psyhocologist  Karmen Bongo and Dr Merla Riches. Has been on adderall 20mg  BID. No chest pain, increased anxiety, palpitations.   Works around copper and thinks that may have gotten in left ear  Also complains of acne,  started mupiricin, doxycycline at last visit with improvement however has returned  Anxiety and depression- Anxiety of late. trouble sleeping and takes flexeril for sleep. 'mind is racing.'  No thoughts hurting herself or anyone else.   Alcohol- sober since 2013      no ckd   HISTORY:  Past Medical History:  Diagnosis Date  . ADD (attention deficit disorder)   . Alcohol abuse    History of.  . Depression   . Hyperlipidemia    Past Surgical History:  Procedure Laterality Date  . TENDON REPAIR Right 09/15/2015   Procedure: RIGHT FOREARM EXPLORATION AND CLOSURE OF LACERATION, TENDON REPAIR;  Surgeon: Bradly Bienenstock, MD;  Location: [facility] OR;  Service: Orthopedics;  Laterality: Right;   Family History  Problem Relation Age of Onset  . Prostate cancer Father   . Hyperlipidemia Father   . Hypertension Father   . Hypertension Cousin     Allergies: Patient has no known allergies. Current Outpatient Prescriptions on File Prior to Visit  Medication Sig Dispense Refill  . albuterol (PROVENTIL HFA;VENTOLIN HFA) 108 (90 Base) MCG/ACT inhaler Inhale 1-2 puffs into the lungs every 6 (six) hours as needed for wheezing or shortness of breath. 18 g 3  . cyclobenzaprine (FLEXERIL) 10 MG tablet Take 1 tablet (10 mg total) by mouth at bedtime as needed for muscle spasms. 90 tablet 3  . loratadine (CLARITIN) 10 MG tablet Take 1 tablet (10 mg total) by mouth daily. 90 tablet 3  . mupirocin ointment (BACTROBAN) 2 % Place 1 application into the nose 3 (three) times daily. 30 g  0   No current facility-administered medications on file prior to visit.     Social History  Substance Use Topics  . Smoking status: Former Smoker    Packs/day: 0.25  . Smokeless tobacco: Never Used  . Alcohol use No     Comment: no alcohol since 12/16/2012    Review of Systems  Constitutional: Negative for chills and fever.  Respiratory: Negative for cough.   Cardiovascular: Negative for chest pain and palpitations.  Gastrointestinal: Negative for nausea and vomiting.  Psychiatric/Behavioral: Positive for sleep disturbance. Negative for suicidal ideas. The patient is nervous/anxious.       Objective:    BP 124/78 (BP Location: Left Arm, Patient Position: Sitting, Cuff Size: Normal)   Pulse 81   Temp 98.1 F (36.7 C) (Oral)   Wt 208 lb 6 oz (94.5 kg)   SpO2 98%   BMI 27.12 kg/m  BP Readings from Last 3 Encounters:  07/28/17 124/78  04/22/17 130/82  01/14/17 (!) 142/88   Wt Readings from Last 3 Encounters:  07/28/17 208 lb 6 oz (94.5 kg)  04/22/17 200 lb 6.4 oz (90.9 kg)  01/14/17 211 lb 8 oz (95.9 kg)    Physical Exam  Constitutional: He appears well-developed and well-nourished.  HENT:  Head:    multiple comedones and small inflammatory papules on right cheek. No pustules.    Cardiovascular: Regular rhythm and normal heart sounds.  Pulmonary/Chest: Effort normal and breath sounds normal. No respiratory distress. He has no wheezes. He has no rhonchi. He has no rales.  Neurological: He is alert.  Skin: Skin is warm and dry.  Psychiatric: He has a normal mood and affect. His speech is normal and behavior is normal.  Vitals reviewed.      Assessment & Plan:   Problem List Items Addressed This Visit      Musculoskeletal and Integument   Acne    Chronic. He saw dermatology in the past has been on doxycycline. We'll try doxycycline one more  time. Advised patient if no improvement, I would like to refer him dermatology.      Relevant Medications    doxycycline (VIBRA-TABS) 100 MG tablet     Other   ADD (attention deficit disorder)    Control on current regimen. New CSC.  Refilled I looked up patient on [place] Controlled Substances Reporting System and saw no activity that raised concern of inappropriate use.        Relevant Medications   amphetamine-dextroamphetamine (ADDERALL) 20 MG tablet   ADDERALL 20 MG tablet   amphetamine-dextroamphetamine (ADDERALL) 20 MG tablet   H/O ETOH abuse    Sober since 2013      Anxiety and depression - Primary    After long discussion with patient regarding anxiety and how it affects sleep. He is reluctant to decrease his afternoon dose of Adderall to 10 mg and preferred to start SSRI. Advised patient to not take Flexeril for insomnia. He is very agreeable to trial of zoloft. We will start a low-dose Zoloft. Follow-up in 2 months      Relevant Medications   sertraline (ZOLOFT) 50 MG tablet       I have discontinued Mr. Veldhuizen doxycycline. I have also changed his ADDERALL. Additionally, I am having him start on doxycycline and sertraline. Lastly, I am having him maintain his albuterol, loratadine, cyclobenzaprine, mupirocin ointment, amphetamine-dextroamphetamine, and amphetamine-dextroamphetamine.   Meds ordered this encounter  Medications  . amphetamine-dextroamphetamine (ADDERALL) 20 MG tablet    Sig: Take 1 tablet (20 mg total) by mouth 2 (two) times daily.    Dispense:  14 tablet    Refill:  0    Do not fill until 09/25/17    Order Specific Question:   Supervising Provider    Answer:   Duncan Dull L [[id number]]  . ADDERALL 20 MG tablet    Sig: Take 1 tablet (20 mg total) by mouth 2 (two) times daily.    Dispense:  60 tablet    Refill:  0    Order Specific Question:   Supervising Provider    Answer:   Duncan Dull L [[id number]]  . amphetamine-dextroamphetamine (ADDERALL) 20 MG tablet    Sig: Take 1 tablet (20 mg total) by mouth 2 (two) times daily.    Dispense:  60 tablet    Refill:   0    Do not fill before 08/28/17    Order Specific Question:   Supervising Provider    Answer:   Duncan Dull L [[id number]]  . doxycycline (VIBRA-TABS) 100 MG tablet    Sig: Take 1 tablet (100 mg total) by mouth 2 (two) times daily.    Dispense:  10 tablet    Refill:  0    Order Specific Question:   Supervising Provider    Answer:   Duncan Dull L [[id number]]  . sertraline (ZOLOFT) 50 MG tablet    Sig: Take 1 tablet (50  mg total) by mouth at bedtime.    Dispense:  90 tablet    Refill:  3    Order Specific Question:   Supervising Provider    Answer:   Sherlene ShamsULLO, TERESA L [[id number]]    Return precautions given.   Risks, benefits, and alternatives of the medications and treatment plan prescribed today were discussed, and patient expressed understanding.   Education regarding symptom management and diagnosis given to patient on AVS.  Continue to follow with Allegra GranaArnett, Evett Kassa G, FNP for routine health maintenance.   Karen ChafeMichael S Kobashigawa and I agreed with plan.   Rennie PlowmanMargaret Mersedes Alber, FNP

## 2017-10-07 ENCOUNTER — Ambulatory Visit (INDEPENDENT_AMBULATORY_CARE_PROVIDER_SITE_OTHER): Payer: 59 | Admitting: Internal Medicine

## 2017-10-07 ENCOUNTER — Encounter: Payer: Self-pay | Admitting: Internal Medicine

## 2017-10-07 DIAGNOSIS — F32A Depression, unspecified: Secondary | ICD-10-CM

## 2017-10-07 DIAGNOSIS — F329 Major depressive disorder, single episode, unspecified: Secondary | ICD-10-CM

## 2017-10-07 DIAGNOSIS — Z1159 Encounter for screening for other viral diseases: Secondary | ICD-10-CM

## 2017-10-07 DIAGNOSIS — Z1329 Encounter for screening for other suspected endocrine disorder: Secondary | ICD-10-CM

## 2017-10-07 DIAGNOSIS — E785 Hyperlipidemia, unspecified: Secondary | ICD-10-CM | POA: Diagnosis not present

## 2017-10-07 DIAGNOSIS — T148XXA Other injury of unspecified body region, initial encounter: Secondary | ICD-10-CM

## 2017-10-07 DIAGNOSIS — F988 Other specified behavioral and emotional disorders with onset usually occurring in childhood and adolescence: Secondary | ICD-10-CM

## 2017-10-07 DIAGNOSIS — F419 Anxiety disorder, unspecified: Secondary | ICD-10-CM | POA: Diagnosis not present

## 2017-10-07 DIAGNOSIS — Z0000 Encounter for general adult medical examination without abnormal findings: Secondary | ICD-10-CM

## 2017-10-07 MED ORDER — AMPHETAMINE-DEXTROAMPHETAMINE 20 MG PO TABS: 20.0000 mg | ORAL_TABLET | Freq: Two times a day (BID) | ORAL | 0 refills | Status: DC

## 2017-10-07 NOTE — Addendum Note (Signed)
Addended by: Warden FillersWRIGHT, Kandance Yano S on: 10/07/2017 03:01 PM   Modules accepted: Orders

## 2017-10-07 NOTE — Progress Notes (Signed)
Chief Complaint  Patient presents with  . Follow-up   Follow up  1. ADD ?hyperactivity needs refills of medications  2. Open wounds to neck left posterior, left lower back. 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 He has bactroban at home will use to heal 3. He thinks zoloft is helping   Review of Systems  Constitutional: Negative for weight loss.  HENT: Negative for hearing loss.   Respiratory: Negative for shortness of breath.   Cardiovascular: Negative for chest pain.  Gastrointestinal: Negative for abdominal pain.  Musculoskeletal: Negative for falls.  Skin:       +skin wounds   Psychiatric/Behavioral: Negative for depression and memory loss.       +improved sleep    Past Medical History:  Diagnosis Date  . ADD (attention deficit disorder)   . Alcohol abuse    History of.  . Depression   . Hyperlipidemia    Past Surgical History:  Procedure Laterality Date  . TENDON REPAIR Right 09/15/2015   Procedure: RIGHT FOREARM EXPLORATION AND CLOSURE OF LACERATION, TENDON REPAIR;  Surgeon: Bradly Bienenstock, MD;  Location: [facility] OR;  Service: Orthopedics;  Laterality: Right;   Family History  Problem Relation Age of Onset  . Prostate cancer Father   . Hyperlipidemia Father   . Hypertension Father   . Hypertension Cousin   . Hyperlipidemia Other    Social History   Socioeconomic History  . Marital status: Married    Spouse name: Kaylon Hitz  . Number of children: 2  . Years of education: 26  . Highest education level: Not on file  Social Needs  . Financial resource strain: Not on file  . Food insecurity - worry: Not on file  . Food insecurity - inability: Not on file  . Transportation needs - medical: Not on file  . Transportation needs - non-medical: Not on file  Occupational History    Comment: [employer]  Tobacco Use  . Smoking status: Former Smoker    Packs/day: 0.25  . Smokeless tobacco: Never Used  Substance and Sexual Activity    . Alcohol use: No    Comment: no alcohol since 12/16/2012  . Drug use: No  . Sexual activity: Yes    Partners: Female  Other Topics Concern  . Not on file  Social History Narrative   Lives with his wife and their two daughters (17 and 73 as of 10/07/17)   Rebuild induction coils for work   Former smoker x 5-6 years 2 packs per week quit   Current Meds  Medication Sig  . amphetamine-dextroamphetamine (ADDERALL) 20 MG tablet Take 1 tablet (20 mg total) by mouth 2 (two) times daily.  . cyclobenzaprine (FLEXERIL) 10 MG tablet Take 1 tablet (10 mg total) by mouth at bedtime as needed for muscle spasms.  . mupirocin ointment (BACTROBAN) 2 % Place 1 application into the nose 3 (three) times daily. (Patient taking differently: Apply 1 application topically 2 (two) times daily. )  . sertraline (ZOLOFT) 50 MG tablet Take 1 tablet (50 mg total) by mouth at bedtime.  . [DISCONTINUED] ADDERALL 20 MG tablet Take 1 tablet (20 mg total) by mouth 2 (two) times daily.  . [DISCONTINUED] amphetamine-dextroamphetamine (ADDERALL) 20 MG tablet Take 1 tablet (20 mg total) by mouth 2 (two) times daily.  . [DISCONTINUED] amphetamine-dextroamphetamine (ADDERALL) 20 MG tablet Take 1 tablet (20 mg total) by mouth 2 (two) times daily.  . [DISCONTINUED] amphetamine-dextroamphetamine (ADDERALL) 20 MG tablet  Take 1 tablet (20 mg total) by mouth 2 (two) times daily.  . [DISCONTINUED] amphetamine-dextroamphetamine (ADDERALL) 20 MG tablet Take 1 tablet (20 mg total) by mouth 2 (two) times daily.   No Known Allergies No results found for this or any previous visit (from the past 2160 hour(s)). Objective  There is no height or weight on file to calculate BMI. Wt Readings from Last 3 Encounters:  07/28/17 208 lb 6 oz (94.5 kg)  04/22/17 200 lb 6.4 oz (90.9 kg)  01/14/17 211 lb 8 oz (95.9 kg)   Temp Readings from Last 3 Encounters:  07/28/17 98.1 F (36.7 C) (Oral)  04/22/17 98 F (36.7 C) (Oral)  01/14/17 98.6 F (37  C) (Oral)   BP Readings from Last 3 Encounters:  07/28/17 124/78  04/22/17 130/82  01/14/17 (!) 142/88   Pulse Readings from Last 3 Encounters:  07/28/17 81  04/22/17 85  01/14/17 89   O2 sat room air 98%   Physical Exam  Constitutional: He is oriented to person, place, and time and well-developed, well-nourished, and in no distress.  HENT:  Head: Normocephalic and atraumatic.  Ears:  Mouth/Throat: Oropharynx is clear and moist and mucous membranes are normal.  Eyes: Conjunctivae are normal. Pupils are equal, round, and reactive to light.  Cardiovascular: Normal rate, regular rhythm and normal heart sounds.  Pulmonary/Chest: Effort normal and breath sounds normal.  Abdominal: Soft. Bowel sounds are normal.  Musculoskeletal:       Arms: Neurological: He is alert and oriented to person, place, and time. Gait normal. Gait normal.  Skin: Skin is warm and dry.  Open wounds as mentioned neck and lower back left   Psychiatric: Mood, memory, affect and judgment normal.  Nursing note and vitals reviewed.   Assessment   1. ADD  2. Open wounds  3. HLD hx  4. HM Plan  1.  Refilled x 3 month supply today  [place] get psychological testing [place] [phone] requested again  2. Use bactroban bid prn  3. Check labs before next f/u  4. Declines flu shot today  Tdap due 12/04/2021  Declines STD testing will check titer   Congratulated on quitting smoking   Wellness labs before next visit CMET, CBC, lipid, UA, TSH, T4, UDS, hep B titer  Of note he reports he gave CPR mouth to mouth 2 years ago and pt died but he came into contact with bodily fluids and had testing for infectious diseases and neg.   Per pt Zoloft is helping   Provider: Dr. French Anaracy McLean-Scocuzza-[facility]

## 2017-10-07 NOTE — Patient Instructions (Signed)
Use Bactroban 2x per day to open skin wounds as needed  Please follow up for fasting labs (schedule labs) and make appt for physical  Take care    Cholesterol Cholesterol is a white, waxy, fat-like substance that is needed by the human body in small amounts. The liver makes all the cholesterol we need. Cholesterol is carried from the liver by the blood through the blood vessels. Deposits of cholesterol (plaques) may build up on blood vessel (artery) walls. Plaques make the arteries narrower and stiffer. Cholesterol plaques increase the risk for heart attack and stroke. You cannot feel your cholesterol level even if it is very high. The only way to know that it is high is to have a blood test. Once you know your cholesterol levels, you should keep a record of the test results. Work with your health care provider to keep your levels in the desired range. What do the results mean?  Total cholesterol is a rough measure of all the cholesterol in your blood.  LDL (low-density lipoprotein) is the "bad" cholesterol. This is the type that causes plaque to build up on the artery walls. You want this level to be low.  HDL (high-density lipoprotein) is the "good" cholesterol because it cleans the arteries and carries the LDL away. You want this level to be high.  Triglycerides are fat that the body can either burn for energy or store. High levels are closely linked to heart disease. What are the desired levels of cholesterol?  Total cholesterol below 200.  LDL below 100 for people who are at risk, below 70 for people at very high risk.  HDL above 40 is good. A level of 60 or higher is considered to be protective against heart disease.  Triglycerides below 150. How can I lower my cholesterol? Diet Follow your diet program as told by your health care provider.  Choose fish or white meat chicken and [place], roasted or baked. Limit fatty cuts of red meat, fried foods, and processed meats, such as  sausage and lunch meats.  Eat lots of fresh fruits and vegetables.  Choose whole grains, beans, pasta, potatoes, and cereals.  Choose olive oil, corn oil, or canola oil, and use only small amounts.  Avoid butter, mayonnaise, shortening, or palm kernel oils.  Avoid foods with trans fats.  Drink skim or nonfat milk and eat low-fat or nonfat yogurt and cheeses. Avoid whole milk, cream, ice cream, egg yolks, and full-fat cheeses.  Healthier desserts include angel food cake, ginger snaps, animal crackers, hard candy, popsicles, and low-fat or nonfat frozen yogurt. Avoid pastries, cakes, pies, and cookies.  Exercise  Follow your exercise program as told by your health care provider. A regular program: ? Helps to decrease LDL and raise HDL. ? Helps with weight control.  Do things that increase your activity level, such as gardening, walking, and taking the stairs.  Ask your health care provider about ways that you can be more active in your daily life.  Medicine  Take over-the-counter and prescription medicines only as told by your health care provider. ? Medicine may be prescribed by your health care provider to help lower cholesterol and decrease the risk for heart disease. This is usually done if diet and exercise have failed to bring down cholesterol levels. ? If you have several risk factors, you may need medicine even if your levels are normal.  This information is not intended to replace advice given to you by your health care provider. Make  sure you discuss any questions you have with your health care provider. Document Released: 06/15/2001 Document Revised: 04/17/2016 Document Reviewed: 03/20/2016 Elsevier Interactive Patient Education  [employer].

## 2017-12-05 ENCOUNTER — Encounter: Payer: Self-pay | Admitting: Internal Medicine

## 2017-12-05 ENCOUNTER — Other Ambulatory Visit (INDEPENDENT_AMBULATORY_CARE_PROVIDER_SITE_OTHER): Payer: 59

## 2017-12-05 ENCOUNTER — Ambulatory Visit (INDEPENDENT_AMBULATORY_CARE_PROVIDER_SITE_OTHER): Payer: 59 | Admitting: Internal Medicine

## 2017-12-05 VITALS — BP 154/90 | HR 93 | Temp 98.1°F | Ht 74.0 in | Wt 207.1 lb

## 2017-12-05 DIAGNOSIS — Z1329 Encounter for screening for other suspected endocrine disorder: Secondary | ICD-10-CM

## 2017-12-05 DIAGNOSIS — Z1159 Encounter for screening for other viral diseases: Secondary | ICD-10-CM

## 2017-12-05 DIAGNOSIS — Z720 Tobacco use: Secondary | ICD-10-CM | POA: Diagnosis not present

## 2017-12-05 DIAGNOSIS — F419 Anxiety disorder, unspecified: Secondary | ICD-10-CM

## 2017-12-05 DIAGNOSIS — L309 Dermatitis, unspecified: Secondary | ICD-10-CM

## 2017-12-05 DIAGNOSIS — I10 Essential (primary) hypertension: Secondary | ICD-10-CM | POA: Insufficient documentation

## 2017-12-05 DIAGNOSIS — F329 Major depressive disorder, single episode, unspecified: Secondary | ICD-10-CM

## 2017-12-05 DIAGNOSIS — T148XXA Other injury of unspecified body region, initial encounter: Secondary | ICD-10-CM

## 2017-12-05 DIAGNOSIS — E785 Hyperlipidemia, unspecified: Secondary | ICD-10-CM

## 2017-12-05 DIAGNOSIS — Z0000 Encounter for general adult medical examination without abnormal findings: Secondary | ICD-10-CM

## 2017-12-05 DIAGNOSIS — R030 Elevated blood-pressure reading, without diagnosis of hypertension: Secondary | ICD-10-CM

## 2017-12-05 DIAGNOSIS — F988 Other specified behavioral and emotional disorders with onset usually occurring in childhood and adolescence: Secondary | ICD-10-CM | POA: Diagnosis not present

## 2017-12-05 LAB — COMPREHENSIVE METABOLIC PANEL
ALK PHOS: 104 U/L (ref 39–117)
ALT: 31 U/L (ref 0–53)
AST: 22 U/L (ref 0–37)
Albumin: 4 g/dL (ref 3.5–5.2)
BUN: 14 mg/dL (ref 6–23)
CALCIUM: 9.4 mg/dL (ref 8.4–10.5)
CO2: 25 mEq/L (ref 19–32)
Chloride: 102 mEq/L (ref 96–112)
Creatinine, Ser: 0.86 mg/dL (ref 0.40–1.50)
GFR: 102.24 mL/min (ref >60.00)
GLUCOSE: 116 mg/dL — AB (ref 70–99)
POTASSIUM: 4.4 meq/L (ref 3.5–5.1)
Sodium: 137 mEq/L (ref 135–145)
TOTAL PROTEIN: 7.6 g/dL (ref 6.0–8.3)
Total Bilirubin: 0.9 mg/dL (ref 0.2–1.2)

## 2017-12-05 LAB — CBC WITH DIFFERENTIAL/PLATELET
BASOS PCT: 0.5 % (ref 0.0–3.0)
Basophils Absolute: 0 10*3/uL (ref 0.0–0.1)
EOS PCT: 3.6 % (ref 0.0–5.0)
Eosinophils Absolute: 0.2 10*3/uL (ref 0.0–0.7)
HCT: 46.9 % (ref 39.0–52.0)
Hemoglobin: 16.2 g/dL (ref 13.0–17.0)
LYMPHS ABS: 1.2 10*3/uL (ref 0.7–4.0)
Lymphocytes Relative: 24.5 % (ref 12.0–46.0)
MCHC: 34.4 g/dL (ref 30.0–36.0)
MCV: 93 fl (ref 78.0–100.0)
MONO ABS: 0.4 10*3/uL (ref 0.1–1.0)
Monocytes Relative: 8 % (ref 3.0–12.0)
Neutro Abs: 3.1 10*3/uL (ref 1.4–7.7)
Neutrophils Relative %: 63.4 % (ref 43.0–77.0)
Platelets: 265 10*3/uL (ref 150.0–400.0)
RBC: 5.05 Mil/uL (ref 4.22–5.81)
RDW: 13.5 % (ref 11.5–15.5)
WBC: 4.9 10*3/uL (ref 4.0–10.5)

## 2017-12-05 LAB — URINALYSIS, ROUTINE W REFLEX MICROSCOPIC
Bilirubin Urine: NEGATIVE
Hgb urine dipstick: NEGATIVE
Ketones, ur: NEGATIVE
Leukocytes, UA: NEGATIVE
NITRITE: NEGATIVE
RBC / HPF: NONE SEEN (ref 0–?)
SPECIFIC GRAVITY, URINE: 1.02 (ref 1.000–1.030)
TOTAL PROTEIN, URINE-UPE24: NEGATIVE
URINE GLUCOSE: NEGATIVE
Urobilinogen, UA: 0.2 (ref 0.0–1.0)
pH: 6.5 (ref 5.0–8.0)

## 2017-12-05 LAB — LIPID PANEL
CHOL/HDL RATIO: 4
CHOLESTEROL: 220 mg/dL — AB (ref 0–200)
HDL: 50 mg/dL (ref >39.00)
LDL Cholesterol: 150 mg/dL — ABNORMAL HIGH (ref 0–99)
NonHDL: 169.8
TRIGLYCERIDES: 100 mg/dL (ref 0.0–149.0)
VLDL: 20 mg/dL (ref 0.0–40.0)

## 2017-12-05 LAB — T4, FREE: FREE T4: 0.67 ng/dL (ref 0.60–1.60)

## 2017-12-05 LAB — TSH: TSH: 2.15 u[IU]/mL (ref 0.35–4.50)

## 2017-12-05 MED ORDER — AMPHETAMINE-DEXTROAMPHETAMINE 20 MG PO TABS: 20.0000 mg | ORAL_TABLET | Freq: Two times a day (BID) | ORAL | 0 refills | Status: DC

## 2017-12-05 MED ORDER — HYDROCORTISONE 2.5 % EX CREA: TOPICAL_CREAM | Freq: Two times a day (BID) | CUTANEOUS | 2 refills | Status: DC

## 2017-12-05 MED ORDER — MUPIROCIN 2 % EX OINT: 1.0000 "application " | TOPICAL_OINTMENT | Freq: Two times a day (BID) | CUTANEOUS | 2 refills | Status: DC

## 2017-12-05 NOTE — Progress Notes (Signed)
Chief Complaint  Patient presents with  . Annual Exam   Annual exam  1. Left ear crusty spot since hit with metal x 8 months at times oozing he has other areas on skin/abrasions from work related injury trunk and extremities  2. Anxiety zoloft 50 mg is helping and he even feels more rested qhs.   3. Still smoking 1-2 cig per day occasionally  4. BP elevated today    Review of Systems  Constitutional: Negative for weight loss.  HENT: Negative for hearing loss.   Eyes: Negative for blurred vision.  Respiratory: Negative for shortness of breath.   Cardiovascular: Negative for chest pain.  Gastrointestinal: Negative for abdominal pain.  Musculoskeletal: Negative for falls.  Skin: Positive for rash.  Neurological: Negative for headaches.  Psychiatric/Behavioral: Negative for depression. The patient is not nervous/anxious and does not have insomnia.    Past Medical History:  Diagnosis Date  . ADD (attention deficit disorder)   . Alcohol abuse    History of.  . Anxiety   . Depression   . Hyperlipidemia    Past Surgical History:  Procedure Laterality Date  . TENDON REPAIR Right 09/15/2015   Procedure: RIGHT FOREARM EXPLORATION AND CLOSURE OF LACERATION, TENDON REPAIR;  Surgeon: Bradly Bienenstock, MD;  Location: [facility] OR;  Service: Orthopedics;  Laterality: Right;   Family History  Problem Relation Age of Onset  . Prostate cancer Father   . Hyperlipidemia Father   . Hypertension Father   . Hypertension Cousin   . Hyperlipidemia Other    Social History   Socioeconomic History  . Marital status: Married    Spouse name: Theodoros Stjames  . Number of children: 2  . Years of education: 31  . Highest education level: Not on file  Social Needs  . Financial resource strain: Not on file  . Food insecurity - worry: Not on file  . Food insecurity - inability: Not on file  . Transportation needs - medical: Not on file  . Transportation needs - non-medical: Not on file  Occupational  History    Comment: [employer]  Tobacco Use  . Smoking status: Former Smoker    Packs/day: 0.25  . Smokeless tobacco: Never Used  Substance and Sexual Activity  . Alcohol use: No    Comment: no alcohol since 12/16/2012  . Drug use: No  . Sexual activity: Yes    Partners: Female  Other Topics Concern  . Not on file  Social History Narrative   Lives with his wife and their two daughters (17 and 47 as of 10/07/17)   Rebuild induction coils for work   Former smoker x 5-6 years 2 packs per week quit   Current Meds  Medication Sig  . amphetamine-dextroamphetamine (ADDERALL) 20 MG tablet Take 1 tablet (20 mg total) by mouth 2 (two) times daily.  . cyclobenzaprine (FLEXERIL) 10 MG tablet Take 1 tablet (10 mg total) by mouth at bedtime as needed for muscle spasms.  . mupirocin ointment (BACTROBAN) 2 % Apply 1 application topically 2 (two) times daily.  . sertraline (ZOLOFT) 50 MG tablet Take 1 tablet (50 mg total) by mouth at bedtime.  . [DISCONTINUED] amphetamine-dextroamphetamine (ADDERALL) 20 MG tablet Take 1 tablet (20 mg total) by mouth 2 (two) times daily.  . [DISCONTINUED] amphetamine-dextroamphetamine (ADDERALL) 20 MG tablet Take 1 tablet (20 mg total) by mouth 2 (two) times daily.  . [DISCONTINUED] amphetamine-dextroamphetamine (ADDERALL) 20 MG tablet Take 1 tablet (20 mg total) by mouth 2 (two)  times daily.  . [DISCONTINUED] mupirocin ointment (BACTROBAN) 2 % Place 1 application into the nose 3 (three) times daily. (Patient taking differently: Apply 1 application topically 2 (two) times daily. )   No Known Allergies Recent Results (from the past 2160 hour(s))  T4, free     Status: None   Collection Time: 12/05/17  9:06 AM  Result Value Ref Range   Free T4 0.67 0.60 - 1.60 ng/dL    Comment: Specimens from patients who are undergoing biotin therapy and /or ingesting biotin supplements may contain high levels of biotin.  The higher biotin concentration in these specimens  interferes with this Free T4 assay.  Specimens that contain high levels  of biotin may cause false high results for this Free T4 assay.  Please interpret results in light of the total clinical presentation of the patient.    Urinalysis, Routine w reflex microscopic     Status: None   Collection Time: 12/05/17  9:06 AM  Result Value Ref Range   Color, Urine YELLOW Yellow;Lt. Yellow   APPearance CLEAR Clear   Specific Gravity, Urine 1.020 1.000 - 1.030   pH 6.5 5.0 - 8.0   Total Protein, Urine NEGATIVE Negative   Urine Glucose NEGATIVE Negative   Ketones, ur NEGATIVE Negative   Bilirubin Urine NEGATIVE Negative   Hgb urine dipstick NEGATIVE Negative   Urobilinogen, UA 0.2 0.0 - 1.0   Leukocytes, UA NEGATIVE Negative   Nitrite NEGATIVE Negative   WBC, UA 0-2/hpf 0-2/hpf   RBC / HPF none seen 0-2/hpf   Squamous Epithelial / LPF Rare(0-4/hpf) Rare(0-4/hpf)  CBC with Differential/Platelet     Status: None   Collection Time: 12/05/17  9:06 AM  Result Value Ref Range   WBC 4.9 4.0 - 10.5 K/uL   RBC 5.05 4.22 - 5.81 Mil/uL   Hemoglobin 16.2 13.0 - 17.0 g/dL   HCT 16.146.9 09.639.0 - 04.552.0 %   MCV 93.0 78.0 - 100.0 fl   MCHC 34.4 30.0 - 36.0 g/dL   RDW 40.913.5 81.111.5 - 91.415.5 %   Platelets 265.0 150.0 - 400.0 K/uL   Neutrophils Relative % 63.4 43.0 - 77.0 %   Lymphocytes Relative 24.5 12.0 - 46.0 %   Monocytes Relative 8.0 3.0 - 12.0 %   Eosinophils Relative 3.6 0.0 - 5.0 %   Basophils Relative 0.5 0.0 - 3.0 %   Neutro Abs 3.1 1.4 - 7.7 K/uL   Lymphs Abs 1.2 0.7 - 4.0 K/uL   Monocytes Absolute 0.4 0.1 - 1.0 K/uL   Eosinophils Absolute 0.2 0.0 - 0.7 K/uL   Basophils Absolute 0.0 0.0 - 0.1 K/uL  Comprehensive metabolic panel     Status: Abnormal   Collection Time: 12/05/17  9:06 AM  Result Value Ref Range   Sodium 137 135 - 145 mEq/L   Potassium 4.4 3.5 - 5.1 mEq/L   Chloride 102 96 - 112 mEq/L   CO2 25 19 - 32 mEq/L   Glucose, Bld 116 (H) 70 - 99 mg/dL   BUN 14 6 - 23 mg/dL   Creatinine, Ser  7.820.86 0.40 - 1.50 mg/dL   Total Bilirubin 0.9 0.2 - 1.2 mg/dL   Alkaline Phosphatase 104 39 - 117 U/L   AST 22 0 - 37 U/L   ALT 31 0 - 53 U/L   Total Protein 7.6 6.0 - 8.3 g/dL   Albumin 4.0 3.5 - 5.2 g/dL   Calcium 9.4 8.4 - 95.610.5 mg/dL   GFR 213.08102.24 >65.78>60.00 mL/min  TSH     Status: None   Collection Time: 12/05/17 11:30 AM  Result Value Ref Range   TSH 2.15 0.35 - 4.50 uIU/mL  Lipid panel     Status: Abnormal   Collection Time: 12/05/17 11:30 AM  Result Value Ref Range   Cholesterol 220 (H) 0 - 200 mg/dL    Comment: ATP III Classification       Desirable:  < 200 mg/dL               Borderline High:  200 - 239 mg/dL          High:  > = 161 mg/dL   Triglycerides 096.0 0.0 - 149.0 mg/dL    Comment: Normal:  <454 mg/dLBorderline High:  150 - 199 mg/dL   HDL 09.81 >19.14 mg/dL   VLDL 78.2 0.0 - 95.6 mg/dL   LDL Cholesterol 213 (H) 0 - 99 mg/dL   Total CHOL/HDL Ratio 4     Comment:                Men          Women1/2 Average Risk     3.4          3.3Average Risk          5.0          4.42X Average Risk          9.6          7.13X Average Risk          15.0          11.0                       NonHDL 169.80     Comment: NOTE:  Non-HDL goal should be 30 mg/dL higher than patient's LDL goal (i.e. LDL goal of < 70 mg/dL, would have non-HDL goal of < 100 mg/dL)   Objective  Body mass index is 26.59 kg/m. Wt Readings from Last 3 Encounters:  12/05/17 207 lb 1.9 oz (93.9 kg)  07/28/17 208 lb 6 oz (94.5 kg)  04/22/17 200 lb 6.4 oz (90.9 kg)   Temp Readings from Last 3 Encounters:  12/05/17 98.1 F (36.7 C) (Oral)  07/28/17 98.1 F (36.7 C) (Oral)  04/22/17 98 F (36.7 C) (Oral)   BP Readings from Last 3 Encounters:  12/05/17 (!) 154/90  07/28/17 124/78  04/22/17 130/82   Pulse Readings from Last 3 Encounters:  12/05/17 93  07/28/17 81  04/22/17 85   O2 sat room air 98%   Physical Exam  Constitutional: He is oriented to person, place, and time and well-developed,  well-nourished, and in no distress.  HENT:  Head: Normocephalic and atraumatic.  Mouth/Throat: Oropharynx is clear and moist and mucous membranes are normal.  Eyes: Conjunctivae are normal. Pupils are equal, round, and reactive to light.  Cardiovascular: Normal rate, regular rhythm and normal heart sounds.  Pulmonary/Chest: Effort normal and breath sounds normal.  Abdominal: Soft. Bowel sounds are normal. There is no tenderness.  Neurological: He is alert and oriented to person, place, and time. Gait normal. Gait normal.  Skin: Skin is warm and dry.     Eczematous changes to left post. Ear lobe Multiple abrasions to trunk and ext   Psychiatric: Mood, memory, affect and judgment normal.  Nursing note and vitals reviewed.   Assessment   1. Annual exam  2. Abrasion and eczematous change left posterior ear  3. Anxiety/adhd  4. Elevated blood pressure  5. Tobacco abuse  6. HM Plan  1. Labs today  rec smoking cessation  2. Use HC behind left ear and bactroban to open wounds  3. Cont zoloft  Refilled adderral x 3 today to start in 60 days as he has 1 adderral Rx to go  Get records psych testing [phone] faxed here   4. Check BP at f/u  5. rec smoking cessation  6.  Declines flu shot Tdap due 12/04/2021  Declines STD testing will check titer hep B rec smoking cessation   Wellness labs today CMET, CBC, lipid, UA, TSH, T4, UDS, hep B titer  Provider: Dr. French Ana McLean-Scocuzza-Internal Medicine

## 2017-12-05 NOTE — Progress Notes (Signed)
Pre visit review using our clinic review tool, if applicable. No additional management support is needed unless otherwise documented below in the visit note. 

## 2017-12-05 NOTE — Patient Instructions (Signed)
F/u in 4 months sooner if needed  Take care   Sertraline tablets What is this medicine? SERTRALINE (SER tra leen) is used to treat depression. It may also be used to treat obsessive compulsive disorder, panic disorder, post-trauma stress, premenstrual dysphoric disorder (PMDD) or social anxiety. This medicine may be used for other purposes; ask your health care provider or pharmacist if you have questions. COMMON BRAND NAME(S): Zoloft What should I tell my health care provider before I take this medicine? They need to know if you have any of these conditions: -bleeding disorders -bipolar disorder or a family history of bipolar disorder -glaucoma -heart disease -high blood pressure -history of irregular heartbeat -history of low levels of calcium, magnesium, or potassium in the blood -if you often drink alcohol -liver disease -receiving electroconvulsive therapy -seizures -suicidal thoughts, plans, or attempt; a previous suicide attempt by you or a family member -take medicines that treat or prevent blood clots -thyroid disease -an unusual or allergic reaction to sertraline, other medicines, foods, dyes, or preservatives -pregnant or trying to get pregnant -breast-feeding How should I use this medicine? Take this medicine by mouth with a glass of water. Follow the directions on the prescription label. You can take it with or without food. Take your medicine at regular intervals. Do not take your medicine more often than directed. Do not stop taking this medicine suddenly except upon the advice of your doctor. Stopping this medicine too quickly may cause serious side effects or your condition may worsen. A special MedGuide will be given to you by the pharmacist with each prescription and refill. Be sure to read this information carefully each time. Talk to your pediatrician regarding the use of this medicine in children. While this drug may be prescribed for children as young as 7 years  for selected conditions, precautions do apply. Overdosage: If you think you have taken too much of this medicine contact a poison control center or emergency room at once. NOTE: This medicine is only for you. Do not share this medicine with others. What if I miss a dose? If you miss a dose, take it as soon as you can. If it is almost time for your next dose, take only that dose. Do not take double or extra doses. What may interact with this medicine? Do not take this medicine with any of the following medications: -cisapride -dofetilide -dronedarone -linezolid -MAOIs like Carbex, Eldepryl, Marplan, Nardil, and Parnate -methylene blue (injected into a vein) -pimozide -thioridazine This medicine may also interact with the following medications: -alcohol -amphetamines -aspirin and aspirin-like medicines -certain medicines for depression, anxiety, or psychotic disturbances -certain medicines for fungal infections like ketoconazole, fluconazole, posaconazole, and itraconazole -certain medicines for irregular heart beat like flecainide, quinidine, propafenone -certain medicines for migraine headaches like almotriptan, eletriptan, frovatriptan, naratriptan, rizatriptan, sumatriptan, zolmitriptan -certain medicines for sleep -certain medicines for seizures like carbamazepine, valproic acid, phenytoin -certain medicines that treat or prevent blood clots like warfarin, enoxaparin, dalteparin -cimetidine -digoxin -diuretics -fentanyl -isoniazid -lithium -NSAIDs, medicines for pain and inflammation, like ibuprofen or naproxen -other medicines that prolong the QT interval (cause an abnormal heart rhythm) -rasagiline -safinamide -supplements like St. John's wort, kava kava, valerian -tolbutamide -tramadol -tryptophan This list may not describe all possible interactions. Give your health care provider a list of all the medicines, herbs, non-prescription drugs, or dietary supplements you use.  Also tell them if you smoke, drink alcohol, or use illegal drugs. Some items may interact with your medicine. What should I  watch for while using this medicine? Tell your doctor if your symptoms do not get better or if they get worse. Visit your doctor or health care professional for regular checks on your progress. Because it may take several weeks to see the full effects of this medicine, it is important to continue your treatment as prescribed by your doctor. Patients and their families should watch out for new or worsening thoughts of suicide or depression. Also watch out for sudden changes in feelings such as feeling anxious, agitated, panicky, irritable, hostile, aggressive, impulsive, severely restless, overly excited and hyperactive, or not being able to sleep. If this happens, especially at the beginning of treatment or after a change in dose, call your health care professional. Bonita QuinYou may get drowsy or dizzy. Do not drive, use machinery, or do anything that needs mental alertness until you know how this medicine affects you. Do not stand or sit up quickly, especially if you are an older patient. This reduces the risk of dizzy or fainting spells. Alcohol may interfere with the effect of this medicine. Avoid alcoholic drinks. Your mouth may get dry. Chewing sugarless gum or sucking hard candy, and drinking plenty of water may help. Contact your doctor if the problem does not go away or is severe. What side effects may I notice from receiving this medicine? Side effects that you should report to your doctor or health care professional as soon as possible: -allergic reactions like skin rash, itching or hives, swelling of the face, lips, or tongue -anxious -black, tarry stools -changes in vision -confusion -elevated mood, decreased need for sleep, racing thoughts, impulsive behavior -eye pain -fast, irregular heartbeat -feeling faint or lightheaded, falls -feeling agitated, angry, or  irritable -hallucination, loss of contact with reality -loss of balance or coordination -loss of memory -painful or prolonged erections -restlessness, pacing, inability to keep still -seizures -stiff muscles -suicidal thoughts or other mood changes -trouble sleeping -unusual bleeding or bruising -unusually weak or tired -vomiting Side effects that usually do not require medical attention (report to your doctor or health care professional if they continue or are bothersome): -change in appetite or weight -change in sex drive or performance -diarrhea -increased sweating -indigestion, nausea -tremors This list may not describe all possible side effects. Call your doctor for medical advice about side effects. You may report side effects to FDA at 1-800-FDA-1088. Where should I keep my medicine? Keep out of the reach of children. Store at room temperature between 15 and 30 degrees C (59 and 86 degrees F). Throw away any unused medicine after the expiration date. NOTE: This sheet is a summary. It may not cover all possible information. If you have questions about this medicine, talk to your doctor, pharmacist, or health care provider.  2018 Elsevier/Gold Standard (2016-09-24 14:17:49)   Generalized Anxiety Disorder, Adult Generalized anxiety disorder (GAD) is a mental health disorder. People with this condition constantly worry about everyday events. Unlike normal anxiety, worry related to GAD is not triggered by a specific event. These worries also do not fade or get better with time. GAD interferes with life functions, including relationships, work, and school. GAD can vary from mild to severe. People with severe GAD can have intense waves of anxiety with physical symptoms (panic attacks). What are the causes? The exact cause of GAD is not known. What increases the risk? This condition is more likely to develop in:  Women.  People who have a family history of anxiety  disorders.  People who are  very shy.  People who experience very stressful life events, such as the death of a loved one.  People who have a very stressful family environment.  What are the signs or symptoms? People with GAD often worry excessively about many things in their lives, such as their health and family. They may also be overly concerned about:  Doing well at work.  Being on time.  Natural disasters.  Friendships.  Physical symptoms of GAD include:  Fatigue.  Muscle tension or having muscle twitches.  Trembling or feeling shaky.  Being easily startled.  Feeling like your heart is pounding or racing.  Feeling out of breath or like you cannot take a deep breath.  Having trouble falling asleep or staying asleep.  Sweating.  Nausea, diarrhea, or irritable bowel syndrome (IBS).  Headaches.  Trouble concentrating or remembering facts.  Restlessness.  Irritability.  How is this diagnosed? Your health care provider can diagnose GAD based on your symptoms and medical history. You will also have a physical exam. The health care provider will ask specific questions about your symptoms, including how severe they are, when they started, and if they come and go. Your health care provider may ask you about your use of alcohol or drugs, including prescription medicines. Your health care provider may refer you to a mental health specialist for further evaluation. Your health care provider will do a thorough examination and may perform additional tests to rule out other possible causes of your symptoms. To be diagnosed with GAD, a person must have anxiety that:  Is out of his or her control.  Affects several different aspects of his or her life, such as work and relationships.  Causes distress that makes him or her unable to take part in normal activities.  Includes at least three physical symptoms of GAD, such as restlessness, fatigue, trouble concentrating,  irritability, muscle tension, or sleep problems.  Before your health care provider can confirm a diagnosis of GAD, these symptoms must be present more days than they are not, and they must last for six months or longer. How is this treated? The following therapies are usually used to treat GAD:  Medicine. Antidepressant medicine is usually prescribed for long-term daily control. Antianxiety medicines may be added in severe cases, especially when panic attacks occur.  Talk therapy (psychotherapy). Certain types of talk therapy can be helpful in treating GAD by providing support, education, and guidance. Options include: ? Cognitive behavioral therapy (CBT). People learn coping skills and techniques to ease their anxiety. They learn to identify unrealistic or negative thoughts and behaviors and to replace them with positive ones. ? Acceptance and commitment therapy (ACT). This treatment teaches people how to be mindful as a way to cope with unwanted thoughts and feelings. ? Biofeedback. This process trains you to manage your body's response (physiological response) through breathing techniques and relaxation methods. You will work with a therapist while machines are used to monitor your physical symptoms.  Stress management techniques. These include yoga, meditation, and exercise.  A mental health specialist can help determine which treatment is best for you. Some people see improvement with one type of therapy. However, other people require a combination of therapies. Follow these instructions at home:  Take over-the-counter and prescription medicines only as told by your health care provider.  Try to maintain a normal routine.  Try to anticipate stressful situations and allow extra time to manage them.  Practice any stress management or self-calming techniques as taught by your  health care provider.  Do not punish yourself for setbacks or for not making progress.  Try to recognize your  accomplishments, even if they are small.  Keep all follow-up visits as told by your health care provider. This is important. Contact a health care provider if:  Your symptoms do not get better.  Your symptoms get worse.  You have signs of depression, such as: ? A persistently sad, cranky, or irritable mood. ? Loss of enjoyment in activities that used to bring you joy. ? Change in weight or eating. ? Changes in sleeping habits. ? Avoiding friends or family members. ? Loss of energy for normal tasks. ? Feelings of guilt or worthlessness. Get help right away if:  You have serious thoughts about hurting yourself or others. If you ever feel like you may hurt yourself or others, or have thoughts about taking your own life, get help right away. You can go to your nearest emergency department or call:  Your local emergency services (911 in the [place]  A suicide crisis helpline, such as the National Suicide Prevention Lifeline at 226 493 7852. This is open 24 hours a day.  Summary  Generalized anxiety disorder (GAD) is a mental health disorder that involves worry that is not triggered by a specific event.  People with GAD often worry excessively about many things in their lives, such as their health and family.  GAD may cause physical symptoms such as restlessness, trouble concentrating, sleep problems, frequent sweating, nausea, diarrhea, headaches, and trembling or muscle twitching.  A mental health specialist can help determine which treatment is best for you. Some people see improvement with one type of therapy. However, other people require a combination of therapies. This information is not intended to replace advice given to you by your health care provider. Make sure you discuss any questions you have with your health care provider. Document Released: 01/15/2013 Document Revised: 08/10/2016 Document Reviewed: 08/10/2016 Elsevier Interactive Patient Education  [employer].

## 2017-12-06 ENCOUNTER — Encounter: Payer: Self-pay | Admitting: *Deleted

## 2017-12-06 LAB — HEPATITIS B SURFACE ANTIBODY, QUANTITATIVE: HEPATITIS B-POST: 27 m[IU]/mL (ref >=10)

## 2017-12-07 ENCOUNTER — Telehealth: Payer: Self-pay

## 2017-12-07 NOTE — Telephone Encounter (Signed)
Left message for provider to return call so we can request psychological records for Dr. Judie GrieveMclean-Scocuzza.

## 2017-12-07 NOTE — Telephone Encounter (Signed)
-----   Message from Bevelyn Bucklesracy N McLean-Scocuzza, MD sent at 12/05/2017 12:44 PM EST ----- Please call [phone] and get psychological testing records  Faxed her   Thanks tSM

## 2017-12-08 LAB — PAIN MGMT, PROFILE 8 W/CONF, U
6 ACETYLMORPHINE: NEGATIVE ng/mL (ref <10)
AMPHETAMINE: 4932 ng/mL — AB (ref <250)
Alcohol Metabolites: POSITIVE ng/mL — AB (ref <500)
Amphetamines: POSITIVE ng/mL — AB (ref <500)
Benzodiazepines: NEGATIVE ng/mL (ref <100)
Buprenorphine, Urine: NEGATIVE ng/mL (ref <5)
Cocaine Metabolite: NEGATIVE ng/mL (ref <150)
Creatinine: 112.6 mg/dL
ETHYL GLUCURONIDE (ETG): 52897 ng/mL — AB (ref <500)
Ethyl Sulfate (ETS): 11586 ng/mL — ABNORMAL HIGH (ref <100)
MARIJUANA METABOLITE: NEGATIVE ng/mL (ref <20)
MDMA: NEGATIVE ng/mL (ref <500)
Methamphetamine: NEGATIVE ng/mL (ref <250)
Opiates: NEGATIVE ng/mL (ref <100)
Oxidant: NEGATIVE ug/mL (ref <200)
Oxycodone: NEGATIVE ng/mL (ref <100)
pH: 6.33 (ref 4.5–9.0)

## 2018-01-02 ENCOUNTER — Encounter: Payer: Self-pay | Admitting: Family

## 2018-04-07 ENCOUNTER — Ambulatory Visit: Payer: Self-pay | Admitting: Family

## 2018-04-10 ENCOUNTER — Telehealth: Payer: Self-pay | Admitting: Family

## 2018-04-10 ENCOUNTER — Other Ambulatory Visit: Payer: Self-pay | Admitting: Family

## 2018-04-10 ENCOUNTER — Encounter: Payer: Self-pay | Admitting: Family

## 2018-04-10 DIAGNOSIS — F988 Other specified behavioral and emotional disorders with onset usually occurring in childhood and adolescence: Secondary | ICD-10-CM

## 2018-04-10 MED ORDER — AMPHETAMINE-DEXTROAMPHETAMINE 20 MG PO TABS: 20.0000 mg | ORAL_TABLET | Freq: Two times a day (BID) | ORAL | 0 refills | Status: DC

## 2018-04-10 NOTE — Telephone Encounter (Signed)
I looked up patient on [place] Controlled Substances Reporting System and saw no activity that raised concern of inappropriate use.   

## 2018-04-10 NOTE — Telephone Encounter (Signed)
Adderall refill Last Refill:03/07/18 #60 Last OV: 12/05/17 PCP: [facility]

## 2018-04-10 NOTE — Telephone Encounter (Signed)
Copied from CRM [id number]. Topic: Quick Communication - Rx Refill/Question >> Apr 10, 2018  9:25 AM [name], [name], NT wrote: Medication:  amphetamine-dextroamphetamine (ADDERALL) 20 MG tablet   Has the patient contacted their pharmacy?  no (Agent: If no, request that the patient contact the pharmacy for the refill. (Agent: If yes, when and what did the pharmacy advise?)  Preferred Pharmacy (with phone number or street name): [facility] [place] [address] [phone] (Phone) [phone] (Fax)      Agent: Please be advised that RX refills may take up to 3 business days. We ask that you follow-up with your pharmacy.

## 2018-04-10 NOTE — Telephone Encounter (Signed)
Copied from CRM [id number]. Topic: General - Other >> Apr 10, 2018  9:19 AM [name], Marge Vandermeulen L, NT wrote: Reason for CRM: Patients wife called and  would like a call back in regards to his last physical ,was testing coded correctly ?, and also the patient has rescheduled his appointment for his med check for 05/05/18 and in need of a refill until then ,please call her at [phone]

## 2018-04-11 NOTE — Telephone Encounter (Signed)
Medication refilled on 04-10-18

## 2018-04-11 NOTE — Telephone Encounter (Signed)
Patients wife calls stating drug screen that was done on 12/05/17 not covered by insurance . Advised patients wife on DPR due to it is scheduled medication , and patient signed controlled substance agreement , agrees to drug testing. I spoke with Market researcherhannon office manager and [facility]

## 2018-05-05 ENCOUNTER — Encounter: Payer: Self-pay | Admitting: Family

## 2018-05-05 ENCOUNTER — Ambulatory Visit (INDEPENDENT_AMBULATORY_CARE_PROVIDER_SITE_OTHER): Payer: 59 | Admitting: Family

## 2018-05-05 VITALS — BP 126/90 | HR 85 | Temp 98.2°F | Ht 74.0 in | Wt 206.8 lb

## 2018-05-05 DIAGNOSIS — M25521 Pain in right elbow: Secondary | ICD-10-CM | POA: Diagnosis not present

## 2018-05-05 DIAGNOSIS — R030 Elevated blood-pressure reading, without diagnosis of hypertension: Secondary | ICD-10-CM | POA: Diagnosis not present

## 2018-05-05 DIAGNOSIS — R0683 Snoring: Secondary | ICD-10-CM | POA: Diagnosis not present

## 2018-05-05 DIAGNOSIS — Z87898 Personal history of other specified conditions: Secondary | ICD-10-CM

## 2018-05-05 DIAGNOSIS — F988 Other specified behavioral and emotional disorders with onset usually occurring in childhood and adolescence: Secondary | ICD-10-CM

## 2018-05-05 DIAGNOSIS — F1011 Alcohol abuse, in remission: Secondary | ICD-10-CM

## 2018-05-05 MED ORDER — AMPHETAMINE-DEXTROAMPHETAMINE 20 MG PO TABS: 20.0000 mg | ORAL_TABLET | Freq: Two times a day (BID) | ORAL | 0 refills | Status: DC

## 2018-05-05 NOTE — Patient Instructions (Signed)
Let me know if elbow doesn't improve conservative management.   Please also dont ever hesistate speaking with me if you start drinking heavily again. Glad you are doing so well  Today we discussed referrals, orders. Sleep study   I have placed these orders in the system for you.  Please be sure to give us a call if you have not heard from our office regarding scheduling a test or regarding referral in a timely manner.  It is very important that you let me know as soon as possible.

## 2018-05-05 NOTE — Progress Notes (Signed)
Subjective:    Patient ID: Shane [name], male    DOB: 15-Apr-1971, 47 y.o.   MRN: [id number]  CC: Shane [name] is a 47 y.o. male who presents today for follow up.   HPI: Elevated blood pressure- wife checking at home 120/80. Denies exertional chest pain or pressure, numbness or tingling radiating to left arm or jaw, palpitations, dizziness, frequent headaches, changes in vision, or shortness of breath.   ADHD- taking 20mg  BID for 3 years. Feels like adequate dose and improves concentration.  No increased anxiety, trouble sleeping  Depression and anxiety- doing better on zoloft. No si/hi  Snores.   Takes flexeril PRN for occasional upper back pain. H/o scolosis.   Right elbow pain for past month, unchanged. 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 No numbness or tingling. Ibuprofen doesn't help. Using salon pas patch and ACE wrap with relief. No elbow swelling, fever, redness.   Alcohol- 2 classes of wine couple of times a month. Only drinks with wife. Feels in control now.  Did AA years ago, which helped. Doesn't feel that he needs AA at that time. States   Doesn't feel he needs to  cut down on drinking. Denies people feeling annoyed by alcohol use nor feeling guilty for alcohol consumption. Denies having an eye opener.      HISTORY:  Past Medical History:  Diagnosis Date  . ADD (attention deficit disorder)   . Alcohol abuse    History of.  . Anxiety   . Depression   . Hyperlipidemia    Past Surgical History:  Procedure Laterality Date  . TENDON REPAIR Right 09/15/2015   Procedure: RIGHT FOREARM EXPLORATION AND CLOSURE OF LACERATION, TENDON REPAIR;  Surgeon: Bradly Bienenstock, MD;  Location: [facility] OR;  Service: Orthopedics;  Laterality: Right;   Family History  Problem Relation Age of Onset  . Prostate cancer Father   . Hyperlipidemia Father   . Hypertension Father   . Hypertension Cousin   .  Hyperlipidemia Other     Allergies: Patient has no known allergies. Current Outpatient Medications on File Prior to Visit  Medication Sig Dispense Refill  . cyclobenzaprine (FLEXERIL) 10 MG tablet Take 1 tablet (10 mg total) by mouth at bedtime as needed for muscle spasms. 90 tablet 3  . hydrocortisone 2.5 % cream Apply topically 2 (two) times daily. Left ear 30 g 2  . mupirocin ointment (BACTROBAN) 2 % Apply 1 application topically 2 (two) times daily. 30 g 2  . sertraline (ZOLOFT) 50 MG tablet Take 1 tablet (50 mg total) by mouth at bedtime. 90 tablet 3   No current facility-administered medications on file prior to visit.     Social History   Tobacco Use  . Smoking status: Former Smoker    Packs/day: 0.25  . Smokeless tobacco: Never Used  Substance Use Topics  . Alcohol use: No    Comment: no alcohol since 12/16/2012  . Drug use: No    Review of Systems  Constitutional: Negative for chills and fever.  Respiratory: Negative for cough.   Cardiovascular: Negative for chest pain and palpitations.  Gastrointestinal: Negative for nausea and vomiting.  Musculoskeletal: Positive for arthralgias (right elbow). Negative for joint swelling.  Skin: Negative for rash.  Psychiatric/Behavioral: Negative for sleep disturbance and suicidal ideas. The patient is not nervous/anxious.       Objective:    BP 126/90 (BP Location: Left Arm,  Patient Position: Sitting, Cuff Size: Normal)   Pulse 85   Temp 98.2 F (36.8 C) (Oral)   Ht 6\' 2"  (1.88 m)   Wt 206 lb 12 oz (93.8 kg)   SpO2 99%   BMI 26.55 kg/m  BP Readings from Last 3 Encounters:  05/05/18 126/90  12/05/17 (!) 154/90  07/28/17 124/78   Wt Readings from Last 3 Encounters:  05/05/18 206 lb 12 oz (93.8 kg)  12/05/17 207 lb 1.9 oz (93.9 kg)  07/28/17 208 lb 6 oz (94.5 kg)    Physical Exam  Constitutional: He appears well-developed and well-nourished.  Cardiovascular: Regular rhythm and normal heart sounds.    Pulmonary/Chest: Effort normal and breath sounds normal. No respiratory distress. He has no wheezes. He has no rhonchi. He has no rales.  Musculoskeletal:       Right elbow: He exhibits normal range of motion, no swelling and no effusion.  Right elbow:  Full ROM with flexion, extension, supination, and pronation.  Strength 5/5. Negative Yergason's test.  No ecchymosis or swelling.   Lymphadenopathy:       Head (left side): No submandibular and no preauricular adenopathy present.  Neurological: He is alert.  Skin: Skin is warm and dry.  Psychiatric: He has a normal mood and affect. His speech is normal and behavior is normal.  Vitals reviewed.      Assessment & Plan:   Problem List Items Addressed This Visit      Other   ADD (attention deficit disorder) - Primary    Stable on regimen. Refilled.  I looked up patient on [place] Controlled Substances Reporting System and saw no activity that raised concern of inappropriate use.        Relevant Medications   amphetamine-dextroamphetamine (ADDERALL) 20 MG tablet   H/O ETOH abuse    Long discussion regarding patient's recent drug screen and alcohol seen in the urine.  He is negative for the cage questionnaire today.  We have had a long discussion about his history of drinking ;  He is adament that he is in a very different place at this time.  We will continue to discuss as a future visits and he expressed that he would come to me if he ever felt his drinking became a problem.      Elevated blood pressure reading    DBP slightly elevated. He will continue to monitor at home.       Snores    Pending sleep study      Relevant Orders   Ambulatory referral to Sleep Studies   Right elbow pain    Presentation consistent with lateral epicondylitis.  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  Discussed conservative therapy at home.  If no improvement, patient will let me know we will  consult orthopedics.          I am having [name] maintain his cyclobenzaprine, sertraline, mupirocin ointment, hydrocortisone, and amphetamine-dextroamphetamine.   Meds ordered this encounter  Medications  . DISCONTD: amphetamine-dextroamphetamine (ADDERALL) 20 MG tablet    Sig: Take 1 tablet (20 mg total) by mouth 2 (two) times daily.    Dispense:  60 tablet    Refill:  0  . DISCONTD: amphetamine-dextroamphetamine (ADDERALL) 20 MG tablet    Sig: Take 1 tablet (20 mg total) by mouth 2 (two) times daily.    Dispense:  60 tablet    Refill:  0  Fill on or after to 08/05/2018    Order Specific Question:   Supervising Provider    Answer:   Duncan DullULLO, TERESA L [[id number]]  . DISCONTD: amphetamine-dextroamphetamine (ADDERALL) 20 MG tablet    Sig: Take 1 tablet (20 mg total) by mouth 2 (two) times daily.    Dispense:  60 tablet    Refill:  0    Fill on or after to 06/05/2018    Order Specific Question:   Supervising Provider    Answer:   Duncan DullULLO, TERESA L [[id number]]  . amphetamine-dextroamphetamine (ADDERALL) 20 MG tablet    Sig: Take 1 tablet (20 mg total) by mouth 2 (two) times daily.    Dispense:  60 tablet    Refill:  0    Fill on or after to 07/05/2018    Order Specific Question:   Supervising Provider    Answer:   Sherlene ShamsULLO, TERESA L [[id number]]    Return precautions given.   Risks, benefits, and alternatives of the medications and treatment plan prescribed today were discussed, and patient expressed understanding.   Education regarding symptom management and diagnosis given to patient on AVS.  Continue to follow with Allegra GranaArnett, Margaret G, FNP for routine health maintenance.   Karen ChafeMichael S Wion and I agreed with plan.   Rennie PlowmanMargaret Arnett, FNP

## 2018-05-05 NOTE — Progress Notes (Signed)
[facility] Marisue IvanLiz she will cancel Rx

## 2018-05-08 DIAGNOSIS — M25521 Pain in right elbow: Secondary | ICD-10-CM | POA: Insufficient documentation

## 2018-05-08 DIAGNOSIS — R0683 Snoring: Secondary | ICD-10-CM | POA: Insufficient documentation

## 2018-05-08 NOTE — Assessment & Plan Note (Signed)
Pending sleep study

## 2018-05-08 NOTE — Assessment & Plan Note (Signed)
DBP slightly elevated. He will continue to monitor at home.

## 2018-05-08 NOTE — Assessment & Plan Note (Signed)
Stable on regimen. Refilled.  I looked up patient on [place] Controlled Substances Reporting System and saw no activity that raised concern of inappropriate use.

## 2018-05-08 NOTE — Assessment & Plan Note (Signed)
Long discussion regarding patient's recent drug screen and alcohol seen in the urine.  He is negative for the cage questionnaire today.  We have had a long discussion about his history of drinking ;  He is adament that he is in a very different place at this time.  We will continue to discuss as a future visits and he expressed that he would come to me if he ever felt his drinking became a problem.

## 2018-05-08 NOTE — Assessment & Plan Note (Signed)
Presentation consistent with lateral epicondylitis.  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  Discussed conservative therapy at home.  If no improvement, patient will let me know we will consult orthopedics.

## 2018-08-25 ENCOUNTER — Ambulatory Visit (INDEPENDENT_AMBULATORY_CARE_PROVIDER_SITE_OTHER): Payer: 59 | Admitting: Family

## 2018-08-25 ENCOUNTER — Encounter: Payer: Self-pay | Admitting: Family

## 2018-08-25 DIAGNOSIS — R0683 Snoring: Secondary | ICD-10-CM

## 2018-08-25 DIAGNOSIS — F419 Anxiety disorder, unspecified: Secondary | ICD-10-CM

## 2018-08-25 DIAGNOSIS — F988 Other specified behavioral and emotional disorders with onset usually occurring in childhood and adolescence: Secondary | ICD-10-CM | POA: Diagnosis not present

## 2018-08-25 DIAGNOSIS — F329 Major depressive disorder, single episode, unspecified: Secondary | ICD-10-CM | POA: Diagnosis not present

## 2018-08-25 DIAGNOSIS — M25521 Pain in right elbow: Secondary | ICD-10-CM

## 2018-08-25 DIAGNOSIS — R030 Elevated blood-pressure reading, without diagnosis of hypertension: Secondary | ICD-10-CM | POA: Diagnosis not present

## 2018-08-25 MED ORDER — AMPHETAMINE-DEXTROAMPHETAMINE 20 MG PO TABS: 20.0000 mg | ORAL_TABLET | Freq: Two times a day (BID) | ORAL | 0 refills | Status: DC

## 2018-08-25 MED ORDER — AMLODIPINE BESYLATE 2.5 MG PO TABS: 2.5000 mg | ORAL_TABLET | Freq: Every day | ORAL | 1 refills | Status: DC

## 2018-08-25 MED ORDER — SERTRALINE HCL 50 MG PO TABS: 50.0000 mg | ORAL_TABLET | Freq: Every day | ORAL | 3 refills | Status: DC

## 2018-08-25 NOTE — Assessment & Plan Note (Signed)
Patient declines sleep study.  Discussed risk of untreated sleep apnea.  Patient will consider this in the future.

## 2018-08-25 NOTE — Progress Notes (Signed)
Subjective:    Patient ID: Shane [name], male    DOB: [birth year], 47 y.o.   MRN: [id number]  CC: Shane [name] is a 47 y.o. male who presents today for follow up.   HPI: ADHD- happy with 20mg  BID. No increased anxiety,palpations.  Depression- run out of zoloft however noticed it was helping, particularly with sleep. 50mg  has been a good dose for him. Would like refill No si/hi.   Elbow pain has improved. Declines orthopedic consult.   Blood pressure elevated-  At home 128/ 85. Denies exertional chest pain or pressure, numbness or tingling radiating to left arm or jaw, palpitations, dizziness, frequent headaches, changes in vision, or shortness of breath.  No frequent NSAIDs .  Alcohol- drinks occasionally. Feels in control.   Sleep study - declines at this time. No HA.   HISTORY:  Past Medical History:  Diagnosis Date  . ADD (attention deficit disorder)   . Alcohol abuse    History of.  . Anxiety   . Depression   . Hyperlipidemia    Past Surgical History:  Procedure Laterality Date  . TENDON REPAIR Right 09/15/2015   Procedure: RIGHT FOREARM EXPLORATION AND CLOSURE OF LACERATION, TENDON REPAIR;  Surgeon: Bradly Bienenstock, MD;  Location: [facility] OR;  Service: Orthopedics;  Laterality: Right;   Family History  Problem Relation Age of Onset  . Prostate cancer Father   . Hyperlipidemia Father   . Hypertension Father   . Hypertension Cousin   . Hyperlipidemia Other     Allergies: Patient has no known allergies. Current Outpatient Medications on File Prior to Visit  Medication Sig Dispense Refill  . cyclobenzaprine (FLEXERIL) 10 MG tablet Take 1 tablet (10 mg total) by mouth at bedtime as needed for muscle spasms. 90 tablet 3  . hydrocortisone 2.5 % cream Apply topically 2 (two) times daily. Left ear 30 g 2  . mupirocin ointment (BACTROBAN) 2 % Apply 1 application topically 2 (two) times daily. 30 g 2   No current facility-administered medications on file prior to  visit.     Social History   Tobacco Use  . Smoking status: Former Smoker    Packs/day: 0.25  . Smokeless tobacco: Never Used  Substance Use Topics  . Alcohol use: No    Comment: no alcohol since 12/16/2012  . Drug use: No    Review of Systems  Constitutional: Negative for chills and fever.  Respiratory: Negative for cough.   Cardiovascular: Negative for chest pain and palpitations.  Gastrointestinal: Negative for nausea and vomiting.  Psychiatric/Behavioral: Negative for suicidal ideas.      Objective:    BP 130/80   Pulse 87   Temp 98.1 F (36.7 C) (Oral)   Resp 15   Wt 203 lb 4 oz (92.2 kg)   SpO2 97%   BMI 26.10 kg/m  BP Readings from Last 3 Encounters:  08/25/18 130/80  05/05/18 126/90  12/05/17 (!) 154/90   Wt Readings from Last 3 Encounters:  08/25/18 203 lb 4 oz (92.2 kg)  05/05/18 206 lb 12 oz (93.8 kg)  12/05/17 207 lb 1.9 oz (93.9 kg)    Physical Exam  Constitutional: He appears well-developed and well-nourished.  Cardiovascular: Regular rhythm and normal heart sounds.  Pulmonary/Chest: Effort normal and breath sounds normal. No respiratory distress. He has no wheezes. He has no rhonchi. He has no rales.  Neurological: He is alert.  Skin: Skin is warm and dry.  Psychiatric: He has a normal mood and  affect. His speech is normal and behavior is normal.  Vitals reviewed.      Assessment & Plan:   Problem List Items Addressed This Visit      Other   ADD (attention deficit disorder)    Doing well on current regimen; will continue I looked up patient on [place] Controlled Substances Reporting System and saw no activity that raised concern of inappropriate use.        Relevant Medications   amphetamine-dextroamphetamine (ADDERALL) 20 MG tablet   Anxiety and depression    Resume zoloft.       Relevant Medications   sertraline (ZOLOFT) 50 MG tablet   Elevated blood pressure reading    Would like < 120/80 consistently. Low dose amlodipine. Will  follow.      Snores    Patient declines sleep study.  Discussed risk of untreated sleep apnea.  Patient will consider this in the future.      Right elbow pain    Improved.          I am having Shane [name] "Shane [name]" start on amLODipine. I am also having him maintain his cyclobenzaprine, mupirocin ointment, hydrocortisone, sertraline, and amphetamine-dextroamphetamine.   Meds ordered this encounter  Medications  . DISCONTD: amphetamine-dextroamphetamine (ADDERALL) 20 MG tablet    Sig: Take 1 tablet (20 mg total) by mouth 2 (two) times daily.    Dispense:  60 tablet    Refill:  0  . sertraline (ZOLOFT) 50 MG tablet    Sig: Take 1 tablet (50 mg total) by mouth at bedtime.    Dispense:  90 tablet    Refill:  3  . amLODipine (NORVASC) 2.5 MG tablet    Sig: Take 1 tablet (2.5 mg total) by mouth daily.    Dispense:  90 tablet    Refill:  1    Order Specific Question:   Supervising Provider    Answer:   Duncan DullULLO, TERESA L [[id number]]  . DISCONTD: amphetamine-dextroamphetamine (ADDERALL) 20 MG tablet    Sig: Take 1 tablet (20 mg total) by mouth 2 (two) times daily.    Dispense:  60 tablet    Refill:  0    Fill on or after to 09/24/2018    Order Specific Question:   Supervising Provider    Answer:   Duncan DullULLO, TERESA L [[id number]]  . amphetamine-dextroamphetamine (ADDERALL) 20 MG tablet    Sig: Take 1 tablet (20 mg total) by mouth 2 (two) times daily.    Dispense:  60 tablet    Refill:  0    Fill on or after to 10/25/2018    Order Specific Question:   Supervising Provider    Answer:   Sherlene ShamsULLO, TERESA L [[id number]]    Return precautions given.   Risks, benefits, and alternatives of the medications and treatment plan prescribed today were discussed, and patient expressed understanding.   Education regarding symptom management and diagnosis given to patient on AVS.  Continue to follow with Allegra GranaArnett, Felesha Moncrieffe G, FNP for routine health maintenance.   Shane [name] and I agreed with plan.     Rennie PlowmanMargaret Cordia Miklos, FNP

## 2018-08-25 NOTE — Assessment & Plan Note (Addendum)
Would like < 120/80 consistently. Low dose amlodipine. Will follow.

## 2018-08-25 NOTE — Assessment & Plan Note (Signed)
Resume zoloft.

## 2018-08-25 NOTE — Patient Instructions (Signed)
Trial amlodipine Monitor blood pressure,  Goal is less than 120/80, based on newest guidelines; if persistently higher, please make sooner follow up appointment so we can recheck you blood pressure and manage medications  Resume zoloft  Let me know about sleep study

## 2018-08-25 NOTE — Assessment & Plan Note (Addendum)
Doing well on current regimen; will continue I looked up patient on [place] Controlled Substances Reporting System and saw no activity that raised concern of inappropriate use.

## 2018-08-25 NOTE — Assessment & Plan Note (Signed)
Improved

## 2018-11-20 DIAGNOSIS — H169 Unspecified keratitis: Secondary | ICD-10-CM | POA: Diagnosis not present

## 2018-11-22 DIAGNOSIS — H169 Unspecified keratitis: Secondary | ICD-10-CM | POA: Diagnosis not present

## 2018-12-01 ENCOUNTER — Ambulatory Visit (INDEPENDENT_AMBULATORY_CARE_PROVIDER_SITE_OTHER): Payer: 59 | Admitting: Family

## 2018-12-01 ENCOUNTER — Encounter: Payer: Self-pay | Admitting: Family

## 2018-12-01 VITALS — BP 124/72 | HR 97 | Temp 98.4°F | Wt 200.8 lb

## 2018-12-01 DIAGNOSIS — F329 Major depressive disorder, single episode, unspecified: Secondary | ICD-10-CM

## 2018-12-01 DIAGNOSIS — R030 Elevated blood-pressure reading, without diagnosis of hypertension: Secondary | ICD-10-CM | POA: Diagnosis not present

## 2018-12-01 DIAGNOSIS — I10 Essential (primary) hypertension: Secondary | ICD-10-CM | POA: Diagnosis not present

## 2018-12-01 DIAGNOSIS — F1011 Alcohol abuse, in remission: Secondary | ICD-10-CM | POA: Diagnosis not present

## 2018-12-01 DIAGNOSIS — F988 Other specified behavioral and emotional disorders with onset usually occurring in childhood and adolescence: Secondary | ICD-10-CM

## 2018-12-01 DIAGNOSIS — F32A Depression, unspecified: Secondary | ICD-10-CM

## 2018-12-01 DIAGNOSIS — F419 Anxiety disorder, unspecified: Secondary | ICD-10-CM | POA: Diagnosis not present

## 2018-12-01 MED ORDER — AMPHETAMINE-DEXTROAMPHETAMINE 20 MG PO TABS: 20.0000 mg | ORAL_TABLET | Freq: Two times a day (BID) | ORAL | 0 refills | Status: DC

## 2018-12-01 MED ORDER — SERTRALINE HCL 50 MG PO TABS: 50.0000 mg | ORAL_TABLET | Freq: Every day | ORAL | 3 refills | Status: DC

## 2018-12-01 MED ORDER — AMLODIPINE BESYLATE 2.5 MG PO TABS: 2.5000 mg | ORAL_TABLET | Freq: Every day | ORAL | 2 refills | Status: DC

## 2018-12-01 NOTE — Assessment & Plan Note (Addendum)
Doing well on current regimen, will continue. I looked up patient on [place] Controlled Substances Reporting System and saw no activity that raised concern of inappropriate use.   

## 2018-12-01 NOTE — Assessment & Plan Note (Signed)
Currently not drinking.will continue to follow.

## 2018-12-01 NOTE — Assessment & Plan Note (Signed)
Doing well on Zoloft, will continue.  Education provided on sleep hygiene.

## 2018-12-01 NOTE — Assessment & Plan Note (Signed)
At goal.  Discussed episode of what appears to be vasovagal response after working in yard, shower.  Education provided.  Patient did not appear orthostatic.  He will let me know if episodes were to recur.

## 2018-12-01 NOTE — Progress Notes (Signed)
Subjective:    Patient ID: Shane [name], male    DOB: 09-09-1971, 48 y.o.   MRN: [id number]  CC: Shane [name] is a 48 y.o. male who presents today for follow up.   HPI: Overall feels well today   ADHD-doing well on medication.  Would like refill.  Doesn't take afternoon dose everyday, takes prior to 4pm. Takes am dose 7am.  HTN- while working in the yard, picking up sticks, skipped lunch and felt dizzy. Went inside and took a shower, lightheaded and dizzy. Wife took BP and it was 72/62. No syncope, vomiting.  Resolved after eating, rest.  Notes has had spells in the past in which low.  Denies exertional chest pain or pressure, numbness or tingling radiating to left arm or jaw, palpitations, frequent headaches, changes in vision, or shortness of breath.   Anxiety and Depression- doing well on zoloft. Trouble winding down to sleep. Not going to bed at same time.   No thoughts of hurting self or anyone else.  Has not had any alcoholic beverage in 3 months. Last time he did have an alcoholic drink, it was 1 drink during 1 sitting.    HISTORY:  Past Medical History:  Diagnosis Date  . ADD (attention deficit disorder)   . Alcohol abuse    History of.  . Anxiety   . Depression   . Hyperlipidemia    Past Surgical History:  Procedure Laterality Date  . TENDON REPAIR Right 09/15/2015   Procedure: RIGHT FOREARM EXPLORATION AND CLOSURE OF LACERATION, TENDON REPAIR;  Surgeon: Bradly Bienenstock, MD;  Location: [facility] OR;  Service: Orthopedics;  Laterality: Right;   Family History  Problem Relation Age of Onset  . Prostate cancer Father   . Hyperlipidemia Father   . Hypertension Father   . Hypertension Cousin   . Hyperlipidemia Other     Allergies: Patient has no known allergies. Current Outpatient Medications on File Prior to Visit  Medication Sig Dispense Refill  . hydrocortisone 2.5 % cream Apply topically 2 (two) times daily. Left ear 30 g 2  . mupirocin ointment  (BACTROBAN) 2 % Apply 1 application topically 2 (two) times daily. 30 g 2  . cyclobenzaprine (FLEXERIL) 10 MG tablet Take 1 tablet (10 mg total) by mouth at bedtime as needed for muscle spasms. (Patient not taking: Reported on 12/01/2018) 90 tablet 3   No current facility-administered medications on file prior to visit.     Social History   Tobacco Use  . Smoking status: Former Smoker    Packs/day: 0.25  . Smokeless tobacco: Never Used  Substance Use Topics  . Alcohol use: No    Comment: no alcohol since 12/16/2012  . Drug use: No    Review of Systems  Constitutional: Negative for chills and fever.  HENT: Negative for congestion, ear pain, rhinorrhea, sinus pressure and sore throat.   Respiratory: Negative for cough, shortness of breath and wheezing.   Cardiovascular: Negative for chest pain and palpitations.  Gastrointestinal: Negative for diarrhea, nausea and vomiting.  Genitourinary: Negative for dysuria.  Musculoskeletal: Negative for myalgias.  Skin: Negative for rash.  Neurological: Negative for dizziness (resolved) and headaches.  Hematological: Negative for adenopathy.  Psychiatric/Behavioral: Positive for sleep disturbance. Negative for suicidal ideas. The patient is not nervous/anxious (improved).       Objective:    BP 124/72 (BP Location: Left Arm, Patient Position: Sitting, Cuff Size: Large)   Pulse 97   Temp 98.4 F (36.9 C)  Wt 200 lb 12.8 oz (91.1 kg)   SpO2 98%   BMI 25.78 kg/m  BP Readings from Last 3 Encounters:  12/01/18 124/72  08/25/18 130/80  05/05/18 126/90   Wt Readings from Last 3 Encounters:  12/01/18 200 lb 12.8 oz (91.1 kg)  08/25/18 203 lb 4 oz (92.2 kg)  05/05/18 206 lb 12 oz (93.8 kg)    Physical Exam Vitals signs reviewed.  Constitutional:      Appearance: He is well-developed.  Cardiovascular:     Rate and Rhythm: Regular rhythm.     Heart sounds: Normal heart sounds.  Pulmonary:     Effort: Pulmonary effort is normal. No  respiratory distress.     Breath sounds: Normal breath sounds. No wheezing, rhonchi or rales.  Skin:    General: Skin is warm and dry.  Neurological:     Mental Status: He is alert.  Psychiatric:        Speech: Speech normal.        Behavior: Behavior normal.        Assessment & Plan:   Problem List Items Addressed This Visit      Cardiovascular and Mediastinum   HTN (hypertension) - Primary    At goal.  Discussed episode of what appears to be vasovagal response after working in yard, shower.  Education provided.  Patient did not appear orthostatic.  He will let me know if episodes were to recur.      Relevant Medications   amLODipine (NORVASC) 2.5 MG tablet     Other   ADD (attention deficit disorder)    Doing well on current regimen, will continue I looked up patient on [place] Controlled Substances Reporting System and saw no activity that raised concern of inappropriate use.        Relevant Medications   amphetamine-dextroamphetamine (ADDERALL) 20 MG tablet   H/O ETOH abuse    Currently not drinking.will continue to follow.      Anxiety and depression    Doing well on Zoloft, will continue.  Education provided on sleep hygiene.      Relevant Medications   sertraline (ZOLOFT) 50 MG tablet       I am having Shane [name] "Shane" maintain his cyclobenzaprine, mupirocin ointment, hydrocortisone, sertraline, amLODipine, and amphetamine-dextroamphetamine.   Meds ordered this encounter  Medications  . sertraline (ZOLOFT) 50 MG tablet    Sig: Take 1 tablet (50 mg total) by mouth at bedtime.    Dispense:  90 tablet    Refill:  3    Order Specific Question:   Supervising Provider    Answer:   Duncan Dull L [[id number]]  . DISCONTD: amphetamine-dextroamphetamine (ADDERALL) 20 MG tablet    Sig: Take 1 tablet (20 mg total) by mouth 2 (two) times daily.    Dispense:  60 tablet    Refill:  0    Order Specific Question:   Supervising Provider    Answer:   Duncan Dull L [[id number]]  . amLODipine (NORVASC) 2.5 MG tablet    Sig: Take 1 tablet (2.5 mg total) by mouth daily.    Dispense:  90 tablet    Refill:  2    Order Specific Question:   Supervising Provider    Answer:   Duncan Dull L [[id number]]  . DISCONTD: amphetamine-dextroamphetamine (ADDERALL) 20 MG tablet    Sig: Take 1 tablet (20 mg total) by mouth 2 (two) times daily.    Dispense:  60 tablet  Refill:  0    Fill on or after 12/30/2018    Order Specific Question:   Supervising Provider    Answer:   Duncan Dull L [[id number]]  . amphetamine-dextroamphetamine (ADDERALL) 20 MG tablet    Sig: Take 1 tablet (20 mg total) by mouth 2 (two) times daily.    Dispense:  60 tablet    Refill:  0    Fill on or after 01/30/2019    Order Specific Question:   Supervising Provider    Answer:   Sherlene Shams [[id number]]    Return precautions given.   Risks, benefits, and alternatives of the medications and treatment plan prescribed today were discussed, and patient expressed understanding.   Education regarding symptom management and diagnosis given to patient on AVS.  Continue to follow with Allegra Grana, FNP for routine health maintenance.   Shane [name] and I agreed with plan.   Rennie Plowman, FNP

## 2018-12-01 NOTE — Patient Instructions (Addendum)
Please ensure adequate hydration, careful with changing positions.   Let me know of any future episodes of dizziness.  Be very careful with long showers.   Essentials for good sleep:   #1 Exercise #2 Limit Caffeine ( no caffeine after lunch) #3 No smart phones, TV prior to bed -- BLUE light is VERY activating and send the brain an 'awake message.'  #4 Go to bed at same time of night each night and get up at same time of day.  #5 Take 0.5 to 5mg  melatonin at 7pm with dinner -this is when natural melatonin will start to increase #6 May try over the counter Unisom for sleep aid

## 2019-03-02 ENCOUNTER — Encounter: Payer: Self-pay | Admitting: Family

## 2019-03-02 ENCOUNTER — Other Ambulatory Visit: Payer: Self-pay

## 2019-03-02 ENCOUNTER — Ambulatory Visit (INDEPENDENT_AMBULATORY_CARE_PROVIDER_SITE_OTHER): Payer: 59 | Admitting: Family

## 2019-03-02 ENCOUNTER — Emergency Department: Payer: 59

## 2019-03-02 ENCOUNTER — Emergency Department
Admission: EM | Admit: 2019-03-02 | Discharge: 2019-03-02 | Disposition: A | Discharge status: Home / Self Care | Payer: 59 | Attending: Emergency Medicine | Admitting: Emergency Medicine

## 2019-03-02 DIAGNOSIS — Z79899 Other long term (current) drug therapy: Secondary | ICD-10-CM | POA: Diagnosis not present

## 2019-03-02 DIAGNOSIS — F1011 Alcohol abuse, in remission: Secondary | ICD-10-CM | POA: Diagnosis not present

## 2019-03-02 DIAGNOSIS — R079 Chest pain, unspecified: Secondary | ICD-10-CM | POA: Insufficient documentation

## 2019-03-02 DIAGNOSIS — I10 Essential (primary) hypertension: Secondary | ICD-10-CM | POA: Insufficient documentation

## 2019-03-02 DIAGNOSIS — F988 Other specified behavioral and emotional disorders with onset usually occurring in childhood and adolescence: Secondary | ICD-10-CM | POA: Diagnosis not present

## 2019-03-02 DIAGNOSIS — R002 Palpitations: Secondary | ICD-10-CM | POA: Diagnosis not present

## 2019-03-02 DIAGNOSIS — F17210 Nicotine dependence, cigarettes, uncomplicated: Secondary | ICD-10-CM | POA: Insufficient documentation

## 2019-03-02 DIAGNOSIS — R61 Generalized hyperhidrosis: Secondary | ICD-10-CM | POA: Diagnosis not present

## 2019-03-02 DIAGNOSIS — R0789 Other chest pain: Secondary | ICD-10-CM | POA: Diagnosis not present

## 2019-03-02 LAB — BASIC METABOLIC PANEL
Anion gap: 9 (ref 5–15)
BUN: 12 mg/dL (ref 6–20)
CO2: 26 mmol/L (ref 22–32)
Calcium: 8.9 mg/dL (ref 8.9–10.3)
Chloride: 101 mmol/L (ref 98–111)
Creatinine, Ser: 0.83 mg/dL (ref 0.61–1.24)
GFR calc Af Amer: >60 mL/min (ref >60)
GFR calc non Af Amer: >60 mL/min (ref >60)
Glucose, Bld: 120 mg/dL — ABNORMAL HIGH (ref 70–99)
Potassium: 4.4 mmol/L (ref 3.5–5.1)
Sodium: 136 mmol/L (ref 135–145)

## 2019-03-02 LAB — CBC
HCT: 50.7 % (ref 39.0–52.0)
Hemoglobin: 17.2 g/dL — ABNORMAL HIGH (ref 13.0–17.0)
MCH: 31.8 pg (ref 26.0–34.0)
MCHC: 33.9 g/dL (ref 30.0–36.0)
MCV: 93.7 fL (ref 80.0–100.0)
Platelets: 323 10*3/uL (ref 150–400)
RBC: 5.41 MIL/uL (ref 4.22–5.81)
RDW: 13.6 % (ref 11.5–15.5)
WBC: 8 10*3/uL (ref 4.0–10.5)
nRBC: 0 % (ref 0.0–0.2)

## 2019-03-02 LAB — TROPONIN I: Troponin I: <0.03 ng/mL (ref <0.03)

## 2019-03-02 NOTE — Patient Instructions (Signed)
Please go to [facility]

## 2019-03-02 NOTE — ED Notes (Signed)
BP Left arm 152/92.AS

## 2019-03-02 NOTE — Progress Notes (Signed)
This visit type was conducted due to national recommendations for restrictions regarding the COVID-19 pandemic (e.g. social distancing).  This format is felt to be most appropriate for this patient at this time.  All issues noted in this document were discussed and addressed.  No physical exam was performed (except for noted visual exam findings with Video Visits). Virtual Visit via Video Note  I connected with@  on 03/02/19 at  1:30 PM EDT by a video enabled telemedicine application and verified that I am speaking with the correct person using two identifiers.  Location patient: home Location provider:work  Persons participating in the virtual visit: patient, provider  I discussed the limitations of evaluation and management by telemedicine and the availability of in person appointments. The patient expressed understanding and agreed to proceed.   HPI:  CC: elevated blood pressure.   ADHD- only taking one dose of adderall per day.   HTN- compliant with amlodipine;   elevated blood pressure Right arm today 146/72 Left arm 138/82  Episode 3 days ago when not sleeping prone, could feel heart doing weird palpitations for a couple of hours, 'off and on'. No cp, sob, diaphoresis, left arm pain/ numbness,  cough, fever, epigastric burning. Didn't sleep that night as so worried. Felt fine the following day. No alcohol that night. Couple of soda's per day, trying to decrease.   2 days ago felt a tightness in chest in the evening, and felt cold sweats. Felt something was 'sitting on chest.' BP at that time R-180/98 and L-104/80.   Strong family history of MI   Trying to quit smoking.   Month ago was trimming trees and was hit by branch over left breast. No bruising . Very sore.   Anxiety and depression- improved on zoloft.   Alcohol use- once per week, when social. Feels in control of alcohol use.    ROS: See pertinent positives and negatives per HPI.  Past Medical History:   Diagnosis Date  . ADD (attention deficit disorder)   . Alcohol abuse    History of.  . Anxiety   . Depression   . Hyperlipidemia     Past Surgical History:  Procedure Laterality Date  . TENDON REPAIR Right 09/15/2015   Procedure: RIGHT FOREARM EXPLORATION AND CLOSURE OF LACERATION, TENDON REPAIR;  Surgeon: Bradly Bienenstock, MD;  Location: [facility] OR;  Service: Orthopedics;  Laterality: Right;    Family History  Problem Relation Age of Onset  . Prostate cancer Father   . Hyperlipidemia Father   . Hypertension Father   . Lung cancer Maternal Grandfather   . Heart attack Maternal Grandfather 66       died  . Hypertension Cousin   . Hyperlipidemia Other   . Heart attack Paternal Uncle 52       died  . Heart attack Maternal Great-grandfather 66    SOCIAL HX: smoker   Current Outpatient Medications:  .  amLODipine (NORVASC) 2.5 MG tablet, Take 1 tablet (2.5 mg total) by mouth daily., Disp: 90 tablet, Rfl: 2 .  amphetamine-dextroamphetamine (ADDERALL) 20 MG tablet, Take 1 tablet (20 mg total) by mouth 2 (two) times daily., Disp: 60 tablet, Rfl: 0 .  hydrocortisone 2.5 % cream, Apply topically 2 (two) times daily. Left ear, Disp: 30 g, Rfl: 2 .  mupirocin ointment (BACTROBAN) 2 %, Apply 1 application topically 2 (two) times daily., Disp: 30 g, Rfl: 2 .  sertraline (ZOLOFT) 50 MG tablet, Take 1 tablet (50 mg total) by mouth at  bedtime., Disp: 90 tablet, Rfl: 3  EXAM:  VITALS per patient if applicable:  GENERAL: alert, oriented, appears well and in no acute distress  HEENT: atraumatic, conjunttiva clear, no obvious abnormalities on inspection of external nose and ears  NECK: normal movements of the head and neck  LUNGS: on inspection no signs of respiratory distress, breathing rate appears normal, no obvious gross SOB, gasping or wheezing  CV: no obvious cyanosis  MS: moves all visible extremities without noticeable abnormality  PSYCH/NEURO: pleasant and cooperative, no obvious  depression or anxiety, speech and thought processing grossly intact  ASSESSMENT AND PLAN:  Discussed the following assessment and plan:  Chest pain, unspecified type - Plan: Ambulatory referral to Cardiology  Essential hypertension  H/O ETOH abuse  Attention deficit disorder, unspecified hyperactivity presence  Problem List Items Addressed This Visit      Cardiovascular and Mediastinum   HTN (hypertension)    Waxing and waning. Unsure of if BP at home is accurate. Advised in person assessment at higher level of care due to risk factors, concern for cardiac symptoms over past few days. Patient agreeable. He will go to ED. Nurse [facility]. Appointment with Dr Mariah MillingGollan scheduled for 03/05/19 as well.          Other   ADD (attention deficit disorder)    Controlled. Will continue at this time.       H/O ETOH abuse    Appears controlled overall. He is drinking once weekly, socially.       Chest pain - Primary    See note re: HTN      Relevant Orders   Ambulatory referral to Cardiology        I discussed the assessment and treatment plan with the patient. The patient was provided an opportunity to ask questions and all were answered. The patient agreed with the plan and demonstrated an understanding of the instructions.   The patient was advised to call back or seek an in-person evaluation if the symptoms worsen or if the condition fails to improve as anticipated.   Rennie PlowmanMargaret Arnett, FNP

## 2019-03-02 NOTE — Assessment & Plan Note (Addendum)
Waxing and waning. Unsure of if BP at home is accurate. Advised in person assessment at higher level of care due to risk factors, concern for cardiac symptoms over past few days. Patient agreeable. He will go to ED. Nurse gave report to [facility] triage. Appointment with Dr Mariah Milling scheduled for 03/05/19 as well.

## 2019-03-02 NOTE — Assessment & Plan Note (Signed)
See note re: HTN

## 2019-03-02 NOTE — ED Notes (Signed)
Called   No answer in lobby  

## 2019-03-02 NOTE — Assessment & Plan Note (Signed)
Controlled. Will continue at this time.

## 2019-03-02 NOTE — Progress Notes (Signed)
I called and informed triage that patient was on his way to ED. I also called patient to give him directions to [facility] & appointment time.

## 2019-03-02 NOTE — Discharge Instructions (Addendum)
Please follow-up with cardiologist Monday morning.  Return to the emergency department for any chest pain, shortness of breath, dizziness, lightheadedness, palpitations, worsening symptoms or new changes in health.

## 2019-03-02 NOTE — ED Provider Notes (Signed)
[facility] EMERGENCY DEPARTMENT Provider Note   CSN: [id number] Arrival date & time: 03/02/19  1509    History   Chief Complaint Chief Complaint  Patient presents with   Palpitations    HPI Shane [name] is a 48 y.o. male.  Presents to the emergency department for evaluation of palpitations and chest pain.  Patient states 3 days ago while he was sleeping at night he felt palpitations that killed him up at nighttime.  He denies any drug use, chest pain or shortness of breath at the time.  No cough, lower extremity swelling or back pain.  Patient states later on the next day while he was at the grocery store he felt 15 minutes of chest pressure and became diaphoretic.  He denies any shortness of breath at the time.  He has not experienced any chest pain shortness of breath or palpitations over the last 2 days.  His blood pressure has been running slightly higher than normal, 160s over 90s.  He recently spoke with PCP who recommended he come to the ER for evaluation and he also recommended he increase his blood pressure medication.  Patient currently is asymptomatic, denies any symptoms with exertion.  Vital signs have been stable.  He has a history of hypertension and smoking.  No cardiac history.  The patient does have family history of cardiac disease.     HPI  Past Medical History:  Diagnosis Date   ADD (attention deficit disorder)    Alcohol abuse    History of.   Anxiety    Depression    Hyperlipidemia     Patient Active Problem List   Diagnosis Date Noted   Chest pain 03/02/2019   Snores 05/08/2018   Right elbow pain 05/08/2018   HTN (hypertension) 12/05/2017   Eczema 12/05/2017   Wellness examination 12/05/2017   Tobacco abuse 12/05/2017   Acne 04/22/2017   Anxiety and depression 01/14/2017   Mild persistent asthma without complication 10/21/2015   H/O ETOH abuse 12/18/2013   ADD (attention deficit disorder) 04/07/2013     HLD (hyperlipidemia) 12/17/2012    Past Surgical History:  Procedure Laterality Date   TENDON REPAIR Right 09/15/2015   Procedure: RIGHT FOREARM EXPLORATION AND CLOSURE OF LACERATION, TENDON REPAIR;  Surgeon: Bradly Bienenstock, MD;  Location: [facility] OR;  Service: Orthopedics;  Laterality: Right;        Home Medications    Prior to Admission medications   Medication Sig Start Date End Date Taking? Authorizing Provider  amLODipine (NORVASC) 2.5 MG tablet Take 1 tablet (2.5 mg total) by mouth daily. 12/01/18   Allegra Grana, FNP  amphetamine-dextroamphetamine (ADDERALL) 20 MG tablet Take 1 tablet (20 mg total) by mouth 2 (two) times daily. 12/01/18   Allegra Grana, FNP  hydrocortisone 2.5 % cream Apply topically 2 (two) times daily. Left ear 12/05/17   McLean-Scocuzza, Pasty Spillers, MD  mupirocin ointment (BACTROBAN) 2 % Apply 1 application topically 2 (two) times daily. 12/05/17   McLean-Scocuzza, Pasty Spillers, MD  sertraline (ZOLOFT) 50 MG tablet Take 1 tablet (50 mg total) by mouth at bedtime. 12/01/18   Allegra Grana, FNP    Family History Family History  Problem Relation Age of Onset   Prostate cancer Father    Hyperlipidemia Father    Hypertension Father    Lung cancer Maternal Grandfather    Heart attack Maternal Grandfather 77       died   Hypertension Cousin    Hyperlipidemia  Other    Heart attack Paternal Uncle 752       died   Heart attack Maternal Great-grandfather 2971    Social History Social History   Tobacco Use   Smoking status: Current Every Day Smoker    Packs/day: 0.25   Smokeless tobacco: Never Used  Substance Use Topics   Alcohol use: No    Comment: no alcohol since 12/16/2012   Drug use: No     Allergies   Patient has no known allergies.   Review of Systems Review of Systems  Constitutional: Negative.  Negative for activity change, appetite change, chills and fever.  HENT: Negative for congestion, ear pain, mouth sores, rhinorrhea,  sinus pressure, sore throat and trouble swallowing.   Eyes: Negative for photophobia, pain and discharge.  Respiratory: Negative for cough, chest tightness and shortness of breath.   Cardiovascular: Positive for chest pain and palpitations. Negative for leg swelling.  Gastrointestinal: Negative for abdominal distention, abdominal pain, diarrhea, nausea and vomiting.  Musculoskeletal: Negative for arthralgias, back pain and gait problem.  Skin: Negative for color change and rash.  Neurological: Negative for dizziness and headaches.  Hematological: Negative for adenopathy.  Psychiatric/Behavioral: Negative for agitation and behavioral problems.     Physical Exam Updated Vital Signs BP (!) 152/93 (BP Location: Right Arm)    Pulse 92    Temp 98.3 F (36.8 C) (Oral)    Resp 20    Ht 6\' 2"  (1.88 m)    Wt 89.4 kg    SpO2 100%    BMI 25.29 kg/m   Physical Exam Constitutional:      Appearance: He is well-developed.  HENT:     Head: Normocephalic and atraumatic.  Eyes:     Conjunctiva/sclera: Conjunctivae normal.  Neck:     Musculoskeletal: Normal range of motion.  Cardiovascular:     Rate and Rhythm: Normal rate and regular rhythm.     Pulses: Normal pulses.     Heart sounds: Normal heart sounds.  Pulmonary:     Effort: Pulmonary effort is normal. No respiratory distress.     Breath sounds: Normal breath sounds. No wheezing or rales.  Musculoskeletal: Normal range of motion.     Comments: No chest wall or thoracic spine tenderness palpation.  Skin:    General: Skin is warm.     Findings: No rash.  Neurological:     Mental Status: He is alert and oriented to person, place, and time.  Psychiatric:        Behavior: Behavior normal.        Thought Content: Thought content normal.      ED Treatments / Results  Labs (all labs ordered are listed, but only abnormal results are displayed) Labs Reviewed  BASIC METABOLIC PANEL - Abnormal; Notable for the following components:       Result Value   Glucose, Bld 120 (*)    All other components within normal limits  CBC - Abnormal; Notable for the following components:   Hemoglobin 17.2 (*)    All other components within normal limits  TROPONIN I    EKG None  Radiology Dg Chest 2 View  Result Date: 03/02/2019 CLINICAL DATA:  Intermittent palpitations and chest pressure over the last 3 days. EXAM: CHEST - 2 VIEW COMPARISON:  None. FINDINGS: Heart size is normal. Mediastinal shadows are normal. The lungs are clear. No bronchial thickening. No infiltrate, mass, effusion or collapse. Pulmonary vascularity is normal. No bony abnormality. IMPRESSION: Normal chest Electronically  Signed   By: Paulina Fusi M.D.   On: 03/02/2019 19:13    Procedures Procedures (including critical care time)  Medications Ordered in ED Medications - No data to display   Initial Impression / Assessment and Plan / ED Course  I have reviewed the triage vital signs and the nursing notes.  Pertinent labs & imaging results that were available during my care of the patient were reviewed by me and considered in my medical decision making (see chart for details).        48 year old male with history of palpitation 3 days ago and 15-minute episode of chest pain 2 days ago.  Patient with normal EKG and negative troponins.  Chest x-ray negative.  Patient's vital signs are stable.  Patient will be increasing his blood pressure medication as recommended by PCP.  Patient also with cardiologist appointment Monday.  Patient understands signs and symptoms return to the ED for such as any increasing chest pain palpitation shortness of breath worsening symptoms or urgent changes in his health.  Final Clinical Impressions(s) / ED Diagnoses   Final diagnoses:  Palpitations    ED Discharge Orders    None       Ronnette Juniper 03/02/19 2109    Phineas Semen, MD 03/02/19 2235

## 2019-03-02 NOTE — ED Triage Notes (Signed)
Intermittent heart palpitations and chest pressure over last 3 days. No chest pain or palpitations since Wednesday. Hx of same with no medical evaluation for it. Pt alert and oriented X4, active, cooperative, pt in NAD. RR even and unlabored, color WNL.

## 2019-03-02 NOTE — Assessment & Plan Note (Signed)
Appears controlled overall. He is drinking once weekly, socially.

## 2019-03-02 NOTE — ED Notes (Signed)
Called no answer in lobby  

## 2019-03-02 NOTE — ED Notes (Signed)
See triage note  Presents with some palpations earlier today    Denies any sx's at present  Did have some chest pressure earllier

## 2019-03-04 NOTE — Progress Notes (Signed)
Cardiology Office Note  Date:  03/05/2019   ID:  Shane [name], DOB August 12, 1971, MRN [id number]  PCP:  Allegra Grana, FNP   Chief Complaint  Patient presents with  . New Patient (Initial Visit)    referred by PCP for heart fluttering, chest pressure/tightness, and blood pressure issues. Meds reviewed verbally with patient.     HPI:  Mr. Zayon Dommer is a pleasant 48 year old gentleman with past medical history of Anxiety and depression- ADHD- on adderall HTN sociall smoking Who presents by referral from Rennie Plowman for consultation of his chest pain, palpitations, discussion of his cardiac risk factors  Patient was seen in the office today  Recent episode end of May 2020 with symptoms of  palpitations for a couple of hours, 'off and on'. Hips and arms twitching, could not sleep Woke up wife three times Felt fine the following day.   Next day, pressure/ tightness in chest in the evening, at rest  felt cold sweats. Felt something was 'sitting on chest.'  15 min Wife noted difference in blood pressure , one arm seem lower than the other Was not having symptoms in his arms  Seen by margeratte arnette Referred to emergency room for further evaluation  Doing aderall once a day Rather than twice a day  Strong family history of MI  Uncle dies of MI 3,  Grandfather MI 16  Trying to quit smoking.  Probably 1 pack/week  Month ago was trimming trees and was hit by branch over left breast.  sore.   Notes from the emergency room, Cardiac enzymes negative, EKG normal, no acute findings " palpitation 3 days ago and 15-minute episode of chest pain 2 days ago.  Patient with normal EKG and negative troponins. "  Since then he reports feeling fine, has good exercise tolerance,  Denies any chest pain on exertion Does not have palpitations on a regular basis, Bothered by twitching in his arms and legs when he is trying to sleep  EKG shows normal sinus rhythm  with rate 81 bpm no significant ST or T wave changes  PMH:   has a past medical history of ADD (attention deficit disorder), Alcohol abuse, Anxiety, Depression, and Hyperlipidemia.  PSH:    Past Surgical History:  Procedure Laterality Date  . TENDON REPAIR Right 09/15/2015   Procedure: RIGHT FOREARM EXPLORATION AND CLOSURE OF LACERATION, TENDON REPAIR;  Surgeon: Bradly Bienenstock, MD;  Location: [facility] OR;  Service: Orthopedics;  Laterality: Right;    Current Outpatient Medications  Medication Sig Dispense Refill  . amLODipine (NORVASC) 2.5 MG tablet Take 1 tablet (2.5 mg total) by mouth daily. 90 tablet 2  . amphetamine-dextroamphetamine (ADDERALL) 20 MG tablet Take 1 tablet (20 mg total) by mouth 2 (two) times daily. 60 tablet 0  . hydrocortisone 2.5 % cream Apply topically 2 (two) times daily. Left ear 30 g 2  . mupirocin ointment (BACTROBAN) 2 % Apply 1 application topically 2 (two) times daily. 30 g 2  . sertraline (ZOLOFT) 50 MG tablet Take 1 tablet (50 mg total) by mouth at bedtime. 90 tablet 3   No current facility-administered medications for this visit.     Allergies:   Patient has no known allergies.   Social History:  The patient  reports that he has been smoking. He has been smoking about 0.25 packs per day. He has never used smokeless tobacco. He reports that he does not drink alcohol or use drugs.   Family History:   family history  includes Heart attack (age of onset: 6052) in his paternal uncle; Heart attack (age of onset: 4966) in his maternal grandfather; Heart attack (age of onset: 4371) in his maternal great-grandfather; Hyperlipidemia in his father and another family member; Hypertension in his cousin and father; Lung cancer in his maternal grandfather; Prostate cancer in his father.    Review of Systems: Review of Systems  Constitutional: Negative.   HENT: Negative.   Respiratory: Negative.   Cardiovascular: Positive for chest pain and palpitations.  Gastrointestinal:  Negative.   Musculoskeletal: Negative.   Neurological: Negative.   Psychiatric/Behavioral: Negative.   All other systems reviewed and are negative.   PHYSICAL EXAM: VS:  BP (!) 147/90 (BP Location: Right Arm, Patient Position: Sitting, Cuff Size: Normal)   Pulse 81   Ht 6\' 2"  (1.88 m)   Wt 196 lb (88.9 kg)   BMI 25.16 kg/m  , BMI Body mass index is 25.16 kg/m. GEN: Well nourished, well developed, in no acute distress  HEENT: normal  Neck: no JVD, carotid bruits, or masses Cardiac: RRR; no murmurs, rubs, or gallops,no edema  Respiratory:  clear to auscultation bilaterally, normal work of breathing GI: soft, nontender, nondistended, + BS MS: no deformity or atrophy  Skin: warm and dry, no rash Neuro:  Strength and sensation are intact Psych: euthymic mood, full affect   Recent Labs: 03/02/2019: BUN 12; Creatinine, Ser 0.83; Hemoglobin 17.2; Platelets 323; Potassium 4.4; Sodium 136    Lipid Panel Lab Results  Component Value Date   CHOL 220 (H) 12/05/2017   HDL 50.00 12/05/2017   LDLCALC 150 (H) 12/05/2017   TRIG 100.0 12/05/2017      Wt Readings from Last 3 Encounters:  03/05/19 196 lb (88.9 kg)  03/02/19 197 lb (89.4 kg)  12/01/18 200 lb 12.8 oz (91.1 kg)      ASSESSMENT AND PLAN:  Chest pain of uncertain etiology - Plan: EKG 12-Lead Atypical chest pain, no symptoms on exertion He developed symptoms one evening while at rest, work-up in the emergency room with no acute findings Very active at baseline Risk factor does include smoking and high cholesterol CT coronary calcium score suggested when he is ready Literature provided  Palpitations - Plan: EKG 12-Lead Possibly had APCs or PVCs previous night when he appreciated palpitations He does not want medications If symptoms get worse, recommended a monitor/zio could be ordered Recommended he should not confuse restless leg and arm syndrome with palpitations  Mixed hyperlipidemia LDL elevated Recommended  repeat check for 2020 Spent some time discussing screening studies, Suggested he consider CT coronary calcium scoring at some point to define whether he needs more aggressive lipid management Reports currently in between jobs and will wait on testing at this time  Disposition:   F/U as needed   Total encounter time more than 60 minutes  Greater than 50% was spent in counseling and coordination of care with the patient  Patient was seen in consultation for Rennie PlowmanMargaret Arnett and will be referred back to her office for ongoing care of the issues detailed above  No orders of the defined types were placed in this encounter.    Signed, Dossie Arbourim , M.D., Ph.D. 03/05/2019  [facility] [place] [phone]

## 2019-03-05 ENCOUNTER — Encounter: Payer: Self-pay | Admitting: Cardiovascular Disease

## 2019-03-05 ENCOUNTER — Ambulatory Visit (INDEPENDENT_AMBULATORY_CARE_PROVIDER_SITE_OTHER): Payer: 59 | Admitting: Cardiovascular Disease

## 2019-03-05 ENCOUNTER — Other Ambulatory Visit: Payer: Self-pay

## 2019-03-05 VITALS — BP 147/90 | HR 81 | Ht 74.0 in | Wt 196.0 lb

## 2019-03-05 DIAGNOSIS — E782 Mixed hyperlipidemia: Secondary | ICD-10-CM

## 2019-03-05 DIAGNOSIS — R079 Chest pain, unspecified: Secondary | ICD-10-CM

## 2019-03-05 DIAGNOSIS — R0789 Other chest pain: Secondary | ICD-10-CM | POA: Diagnosis not present

## 2019-03-05 DIAGNOSIS — R002 Palpitations: Secondary | ICD-10-CM | POA: Diagnosis not present

## 2019-03-05 NOTE — Patient Instructions (Addendum)
We will give you some information on CT coronary calcium score  Monitor heart rate with apps on your phone,  search for "pulse  Meter"  Medication Instructions:  No changes  If you need a refill on your cardiac medications before your next appointment, please call your pharmacy.    Lab work: No new labs needed   If you have labs (blood work) drawn today and your tests are completely normal, you will receive your results only by: Marland Kitchen MyChart Message (if you have MyChart) OR . A paper copy in the mail If you have any lab test that is abnormal or we need to change your treatment, we will call you to review the results.   Testing/Procedures: No new testing needed   Follow-Up: At [facility], you and your health needs are our priority.  As part of our continuing mission to provide you with exceptional heart care, we have created designated Provider Care Teams.  These Care Teams include your primary Cardiologist (physician) and Advanced Practice Providers (APPs -  Physician Assistants and Nurse Practitioners) who all work together to provide you with the care you need, when you need it.  . You will need a follow up appointment as needed   . Providers on your designated Care Team:   . Nicolasa Ducking, NP . Eula Listen, PA-C . Marisue Ivan, PA-C  Any Other Special Instructions Will Be Listed Below (If Applicable).  For educational health videos Log in to : [email or web address] Or : [email or web address], password : [email or web address]

## 2019-03-21 ENCOUNTER — Encounter: Payer: Self-pay | Admitting: Family

## 2019-03-22 ENCOUNTER — Other Ambulatory Visit: Payer: Self-pay

## 2019-03-22 MED ORDER — HYDROCORTISONE 2.5 % EX CREA: TOPICAL_CREAM | Freq: Two times a day (BID) | CUTANEOUS | 2 refills | Status: DC

## 2019-05-02 ENCOUNTER — Other Ambulatory Visit: Payer: Self-pay | Admitting: Family

## 2019-05-02 DIAGNOSIS — F988 Other specified behavioral and emotional disorders with onset usually occurring in childhood and adolescence: Secondary | ICD-10-CM

## 2019-05-02 MED ORDER — AMPHETAMINE-DEXTROAMPHETAMINE 20 MG PO TABS: 20.0000 mg | ORAL_TABLET | Freq: Two times a day (BID) | ORAL | 0 refills | Status: DC

## 2019-05-02 NOTE — Telephone Encounter (Signed)
Call pt  Hope you are well. I have refilled your adderall  However I wanted to remind you that this is controlled substance.   In order for me to prescribe medication,  patients must be seen every 3-6 months.   Please make follow-up appointment and I look forward to seeing you ; no further refills until you have an appointment  Joycelyn Schmid, NP  I looked up patient on [place] Controlled Substances Reporting System and saw no activity that raised concern of inappropriate use.

## 2019-05-03 ENCOUNTER — Encounter: Payer: Self-pay | Admitting: Family

## 2019-05-04 ENCOUNTER — Telehealth: Payer: Self-pay | Admitting: Family

## 2019-05-04 NOTE — Telephone Encounter (Signed)
I called employee pharmacy pharm tech stated that patient's wife called & she was informed that prescription was ready for pick up.

## 2019-05-04 NOTE — Telephone Encounter (Signed)
Call pt  I thought I refilled adderall this week.   Can we double check with patient /pharmacy what happened here?

## 2019-05-09 NOTE — Telephone Encounter (Signed)
LMTCB to set up appointment.

## 2019-05-28 DIAGNOSIS — H5213 Myopia, bilateral: Secondary | ICD-10-CM | POA: Diagnosis not present

## 2019-07-02 ENCOUNTER — Encounter: Payer: Self-pay | Admitting: Family

## 2019-07-03 ENCOUNTER — Encounter: Payer: Self-pay | Admitting: Emergency Medicine

## 2019-07-03 ENCOUNTER — Other Ambulatory Visit: Payer: Self-pay

## 2019-07-03 ENCOUNTER — Emergency Department
Admission: EM | Admit: 2019-07-03 | Discharge: 2019-07-03 | Disposition: A | Discharge status: Home / Self Care | Payer: 59 | Attending: Emergency Medicine | Admitting: Emergency Medicine

## 2019-07-03 DIAGNOSIS — Z87891 Personal history of nicotine dependence: Secondary | ICD-10-CM | POA: Insufficient documentation

## 2019-07-03 DIAGNOSIS — R45851 Suicidal ideations: Secondary | ICD-10-CM | POA: Insufficient documentation

## 2019-07-03 DIAGNOSIS — F329 Major depressive disorder, single episode, unspecified: Secondary | ICD-10-CM | POA: Insufficient documentation

## 2019-07-03 DIAGNOSIS — I10 Essential (primary) hypertension: Secondary | ICD-10-CM | POA: Diagnosis not present

## 2019-07-03 DIAGNOSIS — Z79899 Other long term (current) drug therapy: Secondary | ICD-10-CM | POA: Diagnosis not present

## 2019-07-03 DIAGNOSIS — F32A Depression, unspecified: Secondary | ICD-10-CM

## 2019-07-03 HISTORY — DX: Essential (primary) hypertension: I10

## 2019-07-03 LAB — CBC
HCT: 53.5 % — ABNORMAL HIGH (ref 39.0–52.0)
Hemoglobin: 18.3 g/dL — ABNORMAL HIGH (ref 13.0–17.0)
MCH: 32.3 pg (ref 26.0–34.0)
MCHC: 34.2 g/dL (ref 30.0–36.0)
MCV: 94.4 fL (ref 80.0–100.0)
Platelets: 310 10*3/uL (ref 150–400)
RBC: 5.67 MIL/uL (ref 4.22–5.81)
RDW: 13 % (ref 11.5–15.5)
WBC: 5.7 10*3/uL (ref 4.0–10.5)
nRBC: 0 % (ref 0.0–0.2)

## 2019-07-03 LAB — COMPREHENSIVE METABOLIC PANEL
ALT: 23 U/L (ref 0–44)
AST: 23 U/L (ref 15–41)
Albumin: 4 g/dL (ref 3.5–5.0)
Alkaline Phosphatase: 103 U/L (ref 38–126)
Anion gap: 11 (ref 5–15)
BUN: 8 mg/dL (ref 6–20)
CO2: 23 mmol/L (ref 22–32)
Calcium: 8.7 mg/dL — ABNORMAL LOW (ref 8.9–10.3)
Chloride: 102 mmol/L (ref 98–111)
Creatinine, Ser: 0.96 mg/dL (ref 0.61–1.24)
GFR calc Af Amer: >60 mL/min (ref >60)
GFR calc non Af Amer: >60 mL/min (ref >60)
Glucose, Bld: 159 mg/dL — ABNORMAL HIGH (ref 70–99)
Potassium: 3.8 mmol/L (ref 3.5–5.1)
Sodium: 136 mmol/L (ref 135–145)
Total Bilirubin: 1.1 mg/dL (ref 0.3–1.2)
Total Protein: 7.3 g/dL (ref 6.5–8.1)

## 2019-07-03 LAB — SALICYLATE LEVEL: Salicylate Lvl: <7 mg/dL (ref 2.8–30.0)

## 2019-07-03 LAB — ETHANOL: Alcohol, Ethyl (B): <10 mg/dL (ref <10)

## 2019-07-03 LAB — ACETAMINOPHEN LEVEL: Acetaminophen (Tylenol), Serum: <10 ug/mL — ABNORMAL LOW (ref 10–30)

## 2019-07-03 NOTE — ED Notes (Signed)
Assumed care of patient. Aox4, presently denies SI. Bought to ed for [facility] psych consult completed. Patient reported going through issues at home with wife, break up and has 2 daughters. Patient works and maintained his responsibility, has support from employers. As per psych will be discharged to home with out patient resources and appointment for follow up. Also will be discontinuing current medications and started on meds suitable for diagnosis.

## 2019-07-03 NOTE — ED Notes (Signed)
Pt refused breakfast tray.  

## 2019-07-03 NOTE — ED Notes (Signed)
Clothing given patient dressed awaiting discharge papers and his out patient appointment.

## 2019-07-03 NOTE — Consult Note (Signed)
[facility] Face-to-Face Psychiatry Consult   Reason for Consult: Depression Referring Physician: Paduchowski Patient Identification: Shane [name] MRN:  [id number] Principal Diagnosis: <principal problem not specified> Diagnosis:  Active Problems:   * No active hospital problems. *   Total Time spent with patient: 45 minutes  Subjective:   Shane [name] is a 48 y.o. male patient admitted due to complaints of depression.  HPI: Patient is a 48 year old male who presents with complaints of depression and insomnia in the context of social stressors.  Patient states that approximately 2 months ago he and his wife of 21 years separated due to marriage difficulties.  Patient states that this is also financial strain for him and he has been stressed about making ends meet.  Patient is currently working but feels like he cannot work to his fullability because of the stress. patient states that since this time he has been very anxious with constant racing thoughts and having a difficult time sleeping.  Patient states that he has custody of his daughter every other week which is somewhat helpful for his anxiety and keeps him motivated to be a good father.  Patient states that for the past month or so he is only been getting approximately 5 hours of sleep per night.  He currently is taking psychiatric medications prescribed by his primary care doctor and has not been to see a psychiatrist in several years.  He does not use substances or drink alcohol he has a history of alcoholism approximately 7 years ago.  Past Psychiatric History: Patient acknowledges a history of alcoholism approximately 7 years ago.  He was formally engaged with AA but has recently stopped.  He states that he drinks 1 or 2 drinks every once in a while.  Denies any current psychiatric provider.  Patient has been taking Adderall 20 mg twice daily in addition to Zoloft 25 mg nightly.  Patient states medications are not helping him  anymore. Risk to Self:  No Risk to Others:  No Prior Inpatient Therapy:  No Prior Outpatient Therapy:  Yes  Past Medical History:  Past Medical History:  Diagnosis Date  . ADD (attention deficit disorder)   . Alcohol abuse    History of.  . Anxiety   . Depression   . Hyperlipidemia   . Hypertension     Past Surgical History:  Procedure Laterality Date  . TENDON REPAIR Right 09/15/2015   Procedure: RIGHT FOREARM EXPLORATION AND CLOSURE OF LACERATION, TENDON REPAIR;  Surgeon: Bradly Bienenstock, MD;  Location: [facility] OR;  Service: Orthopedics;  Laterality: Right;   Family History:  Family History  Problem Relation Age of Onset  . Prostate cancer Father   . Hyperlipidemia Father   . Hypertension Father   . Lung cancer Maternal Grandfather   . Heart attack Maternal Grandfather Shane       died  . Hypertension Cousin   . Hyperlipidemia Other   . Heart attack Paternal Uncle 52       died  . Heart attack Maternal Great-grandfather 59   Family Psychiatric  History: Denies Social History:  Social History   Substance and Sexual Activity  Alcohol Use No   Comment: no alcohol since 12/16/2012     Social History   Substance and Sexual Activity  Drug Use No    Social History   Socioeconomic History  . Marital status: Married    Spouse name: Shane [name]  . Number of children: 2  . Years of education: 68  .  Highest education level: Not on file  Occupational History    Comment: [employer]  Social Needs  . Financial resource strain: Not on file  . Food insecurity    Worry: Not on file    Inability: Not on file  . Transportation needs    Medical: Not on file    Non-medical: Not on file  Tobacco Use  . Smoking status: Current Every Day Smoker    Packs/day: 0.25  . Smokeless tobacco: Never Used  Substance and Sexual Activity  . Alcohol use: No    Comment: no alcohol since 12/16/2012  . Drug use: No  . Sexual activity: Yes    Partners: Female  Lifestyle  .  Physical activity    Days per week: Not on file    Minutes per session: Not on file  . Stress: Not on file  Relationships  . Social Musician on phone: Not on file    Gets together: Not on file    Attends religious service: Not on file    Active member of club or organization: Not on file    Attends meetings of clubs or organizations: Not on file    Relationship status: Not on file  Other Topics Concern  . Not on file  Social History Narrative   Lives with his wife and their two daughters (Shane and Shane as of 10/07/17)   Rebuild induction coils for work   Former smoker x 5-6 years 2 packs per week quit   Additional Social History: Patient lives in a house in [place] he has custody over his Shane year old daughter every other week currently living with his Shane year old daughter who is going to attend flight attendant school.  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  Patient denies substance use admits to occasional alcohol use.    Allergies:  No Known Allergies  Labs:  Results for orders placed or performed during the hospital encounter of 07/03/19 (from the past 48 hour(s))  Comprehensive metabolic panel     Status: Abnormal   Collection Time: 07/03/19  7:12 AM  Result Value Ref Range   Sodium 136 135 - 145 mmol/L   Potassium 3.8 3.5 - 5.1 mmol/L   Chloride 102 98 - 111 mmol/L   CO2 23 22 - 32 mmol/L   Glucose, Bld 159 (H) 70 - 99 mg/dL   BUN 8 6 - 20 mg/dL   Creatinine, Ser 9.14 0.61 - 1.24 mg/dL   Calcium 8.7 (L) 8.9 - 10.3 mg/dL   Total Protein 7.3 6.5 - 8.1 g/dL   Albumin 4.0 3.5 - 5.0 g/dL   AST 23 15 - 41 U/L   ALT 23 0 - 44 U/L   Alkaline Phosphatase 103 38 - 126 U/L   Total Bilirubin 1.1 0.3 - 1.2 mg/dL   GFR calc non Af Amer >60 >60 mL/min   GFR calc Af Amer >60 >60 mL/min   Anion gap 11 5 - 15    Comment: Performed at [facility], [address] [place], [place] [address]  Ethanol     Status:  None   Collection Time: 07/03/19  7:12 AM  Result Value Ref Range   Alcohol, Ethyl (B) <10 <10 mg/dL    Comment: (NOTE) Lowest detectable limit for serum alcohol is 10 mg/dL. For medical purposes only. Performed at [facility], [address] [place], [place] [address]  Salicylate level     Status: None   Collection Time: 07/03/19  7:12 AM  Result Value Ref Range   Salicylate Lvl <7.0 2.8 - 30.0 mg/dL    Comment: Performed at [facility], [address] [place], [place] [address]  Acetaminophen level     Status: Abnormal   Collection Time: 07/03/19  7:12 AM  Result Value Ref Range   Acetaminophen (Tylenol), Serum <10 (L) 10 - 30 ug/mL    Comment: (NOTE) Therapeutic concentrations vary significantly. A range of 10-30 ug/mL  may be an effective concentration for many patients. However, some  are best treated at concentrations outside of this range. Acetaminophen concentrations >150 ug/mL at 4 hours after ingestion  and >50 ug/mL at 12 hours after ingestion are often associated with  toxic reactions. Performed at [facility], [address] [place], [place] [address]   cbc     Status: Abnormal   Collection Time: 07/03/19  7:12 AM  Result Value Ref Range   WBC 5.7 4.0 - 10.5 K/uL   RBC 5.67 4.22 - 5.81 MIL/uL   Hemoglobin 18.3 (H) 13.0 - 17.0 g/dL   HCT 09.853.5 (H) 11.939.0 - 14.752.0 %   MCV 94.4 80.0 - 100.0 fL   MCH 32.3 26.0 - 34.0 pg   MCHC 34.2 30.0 - 36.0 g/dL   RDW 82.913.0 56.211.5 - 13.015.5 %   Platelets 310 150 - 400 K/uL   nRBC 0.0 0.0 - 0.2 %    Comment: Performed at [facility], [address] [place], [place] [address]    No current facility-administered medications for this encounter.    Current Outpatient Medications  Medication Sig Dispense Refill  . amLODipine (NORVASC) 2.5 MG tablet Take 1 tablet (2.5 mg total) by mouth daily. 90 tablet 2  . amphetamine-dextroamphetamine (ADDERALL) 20 MG tablet Take 1 tablet (20 mg total) by  mouth 2 (two) times daily. 60 tablet 0  . hydrocortisone 2.5 % cream Apply topically 2 (two) times daily. Left ear 30 g 2  . mupirocin ointment (BACTROBAN) 2 % Apply 1 application topically 2 (two) times daily. 30 g 2  . sertraline (ZOLOFT) 50 MG tablet Take 1 tablet (50 mg total) by mouth at bedtime. 90 tablet 3    Musculoskeletal: Strength & Muscle Tone: within normal limits Gait & Station: normal Patient leans: N/A  Psychiatric Specialty Exam: Physical Exam  Review of Systems  Psychiatric/Behavioral: Positive for depression. Negative for hallucinations, memory loss, substance abuse and suicidal ideas. The patient is nervous/anxious and has insomnia.   All other systems reviewed and are negative.   Blood pressure 128/64, pulse 86, temperature 97.8 F (36.6 C), temperature source Oral, resp. rate 17, height 6\' 2"  (1.88 m), weight 86.2 kg, SpO2 98 %.Body mass index is 24.39 kg/m.  General Appearance: Casual  Eye Contact:  Good  Speech:  Normal Rate  Volume:  Normal  Mood:  Anxious  Affect:  Congruent  Thought Process:  Coherent  Orientation:  Full (Time, Place, and Person)  Thought Content:  Logical  Suicidal Thoughts:  No  Homicidal Thoughts:  No  Memory:  Immediate;   Good  Judgement:  Fair  Insight:  Fair  Psychomotor Activity:  Normal  Concentration:  Concentration: Fair  Recall:  Fair  Fund of Knowledge:  Good  Language:  Good  Akathisia:  No  Handed:  Right  AIMS (if indicated):     Assets:  Communication Skills Desire for Improvement Housing Physical  Health Resilience Social Support Talents/Skills  ADL's:  Intact  Cognition:  WNL  Sleep:        Treatment Plan Summary:  48 year old male presenting with anxiety in the context of social stressors.  Patient is likely also experiencing increased anxiety from his Adderall medication.  Patient will be provided with outpatient resources as well as instructions of how to down taper his Adderall prior to his  appointment.   DX Adjustment disorder   MEDICATIONS:   Patient instructed to take 10 mg of Adderall for 10 days, followed by 5mg  of Adderall for 10 days.  Patient also provided with outpatient follow-up appointment,  Disposition: No evidence of imminent risk to self or others at present.   Patient does not meet criteria for psychiatric inpatient admission. Supportive therapy provided about ongoing stressors. Discussed crisis plan, support from social network, calling 911, coming to the Emergency Department, and calling Suicide Hotline.  Dixie Dials, MD 07/03/2019 1:05 PM

## 2019-07-03 NOTE — ED Notes (Signed)
Pt states that he and his wife split in early August, she moved in September and ever since pt does not understand his purpose in life. Reports that he has not been sleeping due to continuously thinking. Pt denies SI but does state that he has had moments where he wonders if he would be better off gone. Denies any SI actions, plans or current thoughts. Denies HI. Pt has a home, job and a support person in his boss.

## 2019-07-03 NOTE — Discharge Instructions (Signed)
Please begin taking half of your Adderall tablet (10 mg) twice daily for the next 10 days.  Then you may decrease once again to 5 mg (one quarter of a tablet) twice daily for the following 10 days.  Then 5 mg once daily for the following 10 days.  Then discontinue use of Adderall.

## 2019-07-03 NOTE — ED Notes (Addendum)
Pt appears anxious, awaiting psych assessment. Given verbal reassurance that psychiatrist would come see him. Verbalizes understanding.  Offered breakfast and TV, denies.

## 2019-07-03 NOTE — ED Notes (Signed)
Psychiatrist and TTS at bedside. 

## 2019-07-03 NOTE — BH Assessment (Signed)
Follow-Up Appointment:  [facility] Medication Management Wednesday, October 7th at 9:00am Provider: [facility]. [facility] Outpatient Therapy Friday, October 9th at 8:00am Provider: Glori Bickers, LCSW

## 2019-07-03 NOTE — ED Triage Notes (Signed)
Says he hasnt been able to sleep, depressed.  Recent break up with wife

## 2019-07-03 NOTE — ED Notes (Signed)
Pt offered breakfast tray but refused.

## 2019-07-03 NOTE — ED Provider Notes (Signed)
[facility] Emergency Department Provider Note  Time seen: 7:14 AM  I have reviewed the triage vital signs and the nursing notes.   HISTORY  Chief Complaint Suicidal   HPI Shane [name] is a 48 y.o. male with a past medical history of anxiety, depression, hypertension, hyperlipidemia, presents to the emergency department for worsening depression.  According to the patient he is currently going through a separation from his wife.  States he has not been sleeping well, 3 to 5 hours per night.  Occasional alcohol use but he does not believe it to be excessive.  Denies drug use.  Does state 15 pound weight loss over the past 1 month because he has no appetite.  Patient's boss brought him to the emergency department today for evaluation.  Patient denies any fever cough or shortness of breath.   Past Medical History:  Diagnosis Date  . ADD (attention deficit disorder)   . Alcohol abuse    History of.  . Anxiety   . Depression   . Hyperlipidemia   . Hypertension     Patient Active Problem List   Diagnosis Date Noted  . Chest pain 03/02/2019  . Snores 05/08/2018  . Right elbow pain 05/08/2018  . HTN (hypertension) 12/05/2017  . Eczema 12/05/2017  . Wellness examination 12/05/2017  . Tobacco abuse 12/05/2017  . Acne 04/22/2017  . Anxiety and depression 01/14/2017  . Mild persistent asthma without complication 10/21/2015  . H/O ETOH abuse 12/18/2013  . ADD (attention deficit disorder) 04/07/2013  . HLD (hyperlipidemia) 12/17/2012    Past Surgical History:  Procedure Laterality Date  . TENDON REPAIR Right 09/15/2015   Procedure: RIGHT FOREARM EXPLORATION AND CLOSURE OF LACERATION, TENDON REPAIR;  Surgeon: Bradly Bienenstock, MD;  Location: [facility] OR;  Service: Orthopedics;  Laterality: Right;    Prior to Admission medications   Medication Sig Start Date End Date Taking? Authorizing Provider  amLODipine (NORVASC) 2.5 MG tablet Take 1 tablet (2.5 mg total) by  mouth daily. 12/01/18   Allegra Grana, FNP  amphetamine-dextroamphetamine (ADDERALL) 20 MG tablet Take 1 tablet (20 mg total) by mouth 2 (two) times daily. 05/02/19   Allegra Grana, FNP  hydrocortisone 2.5 % cream Apply topically 2 (two) times daily. Left ear 03/22/19   Allegra Grana, FNP  mupirocin ointment (BACTROBAN) 2 % Apply 1 application topically 2 (two) times daily. 12/05/17   McLean-Scocuzza, Pasty Spillers, MD  sertraline (ZOLOFT) 50 MG tablet Take 1 tablet (50 mg total) by mouth at bedtime. 12/01/18   Allegra Grana, FNP    No Known Allergies  Family History  Problem Relation Age of Onset  . Prostate cancer Father   . Hyperlipidemia Father   . Hypertension Father   . Lung cancer Maternal Grandfather   . Heart attack Maternal Grandfather 66       died  . Hypertension Cousin   . Hyperlipidemia Other   . Heart attack Paternal Uncle 52       died  . Heart attack Maternal Great-grandfather 36    Social History Social History   Tobacco Use  . Smoking status: Current Every Day Smoker    Packs/day: 0.25  . Smokeless tobacco: Never Used  Substance Use Topics  . Alcohol use: No    Comment: no alcohol since 12/16/2012  . Drug use: No    Review of Systems Constitutional: Negative for fever. Cardiovascular: Negative for chest pain. Respiratory: Negative for shortness of breath.  Negative for  cough. Gastrointestinal: Negative for abdominal pain Musculoskeletal: Negative for musculoskeletal complaints Skin: Negative for skin complaints  Neurological: Negative for headache All other ROS negative  ____________________________________________   PHYSICAL EXAM:  VITAL SIGNS: ED Triage Vitals [07/03/19 0705]  Enc Vitals Group     BP 136/89     Pulse Rate 99     Resp 14     Temp 98 F (36.7 C)     Temp Source Oral     SpO2 98 %     Weight 190 lb (86.2 kg)     Height 6\' 2"  (1.88 m)     Head Circumference      Peak Flow      Pain Score 0     Pain Loc       Pain Edu?      Excl. in [place]?    Constitutional: Alert and oriented. Well appearing and in no distress. Eyes: Normal exam ENT      Head: Normocephalic and atraumatic.      Mouth/Throat: Mucous membranes are moist. Cardiovascular: Normal rate, regular rhythm.  Respiratory: Normal respiratory effort without tachypnea nor retractions. Breath sounds are clear  Gastrointestinal: Soft and nontender. No distention.  T Musculoskeletal: Nontender with normal range of motion in all extremities. Neurologic:  Normal speech and language. No gross focal neurologic deficits Skin:  Skin is warm, dry and intact.  Psychiatric: Flat affect  ____________________________________________    INITIAL IMPRESSION / ASSESSMENT AND PLAN / ED COURSE  Pertinent labs & imaging results that were available during my care of the patient were reviewed by me and considered in my medical decision making (see chart for details).   Patient presents to the emergency department for depression.  Patient had sent some text messages implying suicidal ideation.  Patient states he has had some fleeting thoughts but states no active plan.  Patient is willing to stay for help.  We will have psychiatry evaluate.  Given the patient's recent life stressors with weight loss, admitted depression, insomnia, I believe the patient would benefit from psychiatric evaluation and possible treatment.  Patient agreeable.       The patient has been seen and evaluated by psychiatry.  Although the patient does appear depressed I do not believe him to be at risk for suicide and believe the patient is safe for discharge home from a psychiatric standpoint.  Patient's medical work-up is nonrevealing.  We will discharge from the emergency department with outpatient follow-up and instructions to wean off his Adderall.    Shane [name] was evaluated in Emergency Department on 07/03/2019 for the symptoms described in the history of present illness. He  was evaluated in the context of the global COVID-19 pandemic, which necessitated consideration that the patient might be at risk for infection with the SARS-CoV-2 virus that causes COVID-19. Institutional protocols and algorithms that pertain to the evaluation of patients at risk for COVID-19 are in a state of rapid change based on information released by regulatory bodies including the CDC and federal and state organizations. These policies and algorithms were followed during the patient's care in the ED.  ____________________________________________   FINAL CLINICAL IMPRESSION(S) / ED DIAGNOSES  Depression   Harvest Dark, MD 07/03/19 1213

## 2019-07-11 ENCOUNTER — Ambulatory Visit (INDEPENDENT_AMBULATORY_CARE_PROVIDER_SITE_OTHER): Payer: 59 | Admitting: Psychiatry

## 2019-07-11 ENCOUNTER — Other Ambulatory Visit: Payer: Self-pay

## 2019-07-11 ENCOUNTER — Encounter (HOSPITAL_COMMUNITY): Payer: Self-pay | Admitting: Psychiatry

## 2019-07-11 DIAGNOSIS — F988 Other specified behavioral and emotional disorders with onset usually occurring in childhood and adolescence: Secondary | ICD-10-CM | POA: Diagnosis not present

## 2019-07-11 DIAGNOSIS — F331 Major depressive disorder, recurrent, moderate: Secondary | ICD-10-CM | POA: Diagnosis not present

## 2019-07-11 MED ORDER — SERTRALINE HCL 100 MG PO TABS: 100.0000 mg | ORAL_TABLET | Freq: Every day | ORAL | 1 refills | Status: DC

## 2019-07-11 MED ORDER — AMPHETAMINE-DEXTROAMPHETAMINE 20 MG PO TABS: 20.0000 mg | ORAL_TABLET | Freq: Two times a day (BID) | ORAL | 0 refills | Status: DC

## 2019-07-11 MED ORDER — MIRTAZAPINE 15 MG PO TABS: 15.0000 mg | ORAL_TABLET | Freq: Every day | ORAL | 1 refills | Status: DC

## 2019-07-11 NOTE — Progress Notes (Signed)
Psychiatric Initial Adult Assessment   Patient Identification: Shane [name] [name] MRN:  [id number] Date of Evaluation:  07/11/2019 Referral Source: Rennie Plowman Chief Complaint:  Depression, insomnia, lack of appetite Visit Diagnosis:    ICD-10-CM   1. Major depressive disorder, recurrent episode, moderate (HCC)  F33.1   2. Attention deficit disorder, unspecified hyperactivity presence  F98.8   Interview was conducted using WebEx teleconferencing application and I verified that I was speaking with the correct person using two identifiers. I discussed the limitations of evaluation and management by telemedicine and  the availability of in person appointments. Patient expressed understanding and agreed to proceed.  History of Present Illness:  48 yo married male with a hx of depression/anxiety in a setting of marital conflicts. He has become more depressed, anxious, worried, not sleeping well and losing weight (no appetite) after his wife of 21 years Shane Son decided to split and left their home on August 3rd. This is the third time she did that with a first time in 2013 when he was excessively drinking alcohol.  Shane [name] [name] went to AA and achieved sobriety in March 2014. During that time he became depressed and was started on Wellbutrin 150 mg bid. It helped with mood some but caused decline in libido so he eventually stopped taking it. He has now been on a low 50 mg dose of sertraline for about a year 0 unclear benefit. Reports ongoing problems with sleep - sleeps about 3-4 hours, ruminates iwhile in bed, worries about his marriage. He was in [facility] ED on 07/03/19 - brought by his boss because of text messages which could be interpreted as expressing SI. He denies having SI then and now - feels that he was asking for help this way. He has no prior hx of SI/attempts, no hx of inpatient psychiatric admissions. He dneies having hx of mania or psychosis.   Shane [name] [name] has a hx of problems with concentration, task  completion, procrastination since middle school. He was formally diagnosed with ADD in 2014 and started on Adderall - it is prescribed bid but he typically only takes it in am. He has no hx of illicit or prescribed drug abuse.  Medical hx noncontributory - hypertension and hyperlipidemia.  Associated Signs/Symptoms: Depression Symptoms:  depressed mood, insomnia, feelings of worthlessness/guilt, difficulty concentrating, anxiety, weight loss, decreased appetite, (Hypo) Manic Symptoms:  Distractibility, Anxiety Symptoms:  Excessive Worry, Psychotic Symptoms:  None PTSD Symptoms: Negative  Past Psychiatric History: see above.  Previous Psychotropic Medications: Yes   Substance Abuse History in the last 12 months:  No.  Consequences of Substance Abuse: Negative  Past Medical History:  Past Medical History:  Diagnosis Date  . ADD (attention deficit disorder)   . Alcohol abuse    History of.  . Anxiety   . Depression   . Hyperlipidemia   . Hypertension     Past Surgical History:  Procedure Laterality Date  . TENDON REPAIR Right 09/15/2015   Procedure: RIGHT FOREARM EXPLORATION AND CLOSURE OF LACERATION, TENDON REPAIR;  Surgeon: Bradly Bienenstock, MD;  Location: [facility] OR;  Service: Orthopedics;  Laterality: Right;    Family Psychiatric History: Reviewed.  Family History:  Family History  Problem Relation Age of Onset  . Prostate cancer Father   . Hyperlipidemia Father   . Hypertension Father   . Lung cancer Maternal Grandfather   . Heart attack Maternal Grandfather 66       died  . Alcohol abuse Paternal Grandfather   . Hypertension Cousin   .  Hyperlipidemia Other   . Heart attack Paternal Uncle 52       died  . Heart attack Maternal Great-grandfather 71  . Alcohol abuse Maternal Aunt     Social History:   Social History   Socioeconomic History  . Marital status: Married    Spouse name: Shane [name] [name]  . Number of children: 2  . Years of education: 30  .  Highest education level: Not on file  Occupational History    Comment: [employer]  Social Needs  . Financial resource strain: Not on file  . Food insecurity    Worry: Not on file    Inability: Not on file  . Transportation needs    Medical: Not on file    Non-medical: Not on file  Tobacco Use  . Smoking status: Current Every Day Smoker    Packs/day: 0.25  . Smokeless tobacco: Never Used  Substance and Sexual Activity  . Alcohol use: No    Comment: no alcohol since 12/16/2012  . Drug use: No  . Sexual activity: Yes    Partners: Female  Lifestyle  . Physical activity    Days per week: Not on file    Minutes per session: Not on file  . Stress: Not on file  Relationships  . Social Musician on phone: Not on file    Gets together: Not on file    Attends religious service: Not on file    Active member of club or organization: Not on file    Attends meetings of clubs or organizations: Not on file    Relationship status: Not on file  Other Topics Concern  . 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 Enjoys his work. They have two daughters.  Allergies:  No Known Allergies  Metabolic Disorder Labs: No results found for: HGBA1C, MPG No results found for: PROLACTIN Lab Results  Component Value Date   CHOL 220 (H) 12/05/2017   TRIG 100.0 12/05/2017   HDL 50.00 12/05/2017   CHOLHDL 4 12/05/2017   VLDL 20.0 12/05/2017   LDLCALC 150 (H) 12/05/2017   LDLCALC 142 (H) 05/29/2016   Lab Results  Component Value Date   TSH 2.15 12/05/2017    Therapeutic Level Labs: No results found for: LITHIUM No results found for: CBMZ No results found for: VALPROATE  Current Medications: Current Outpatient Medications  Medication Sig  Dispense Refill  . amLODipine (NORVASC) 2.5 MG tablet Take 1 tablet (2.5 mg total) by mouth daily. 90 tablet 2  . amphetamine-dextroamphetamine (ADDERALL) 20 MG tablet Take 1 tablet (20 mg total) by mouth 2 (two) times daily. 60 tablet 0  . hydrocortisone 2.5 % cream Apply topically 2 (two) times daily. Left ear 30 g 2  . mupirocin ointment (BACTROBAN) 2 % Apply 1 application topically 2 (two) times daily. 30 g 2  . sertraline (ZOLOFT) 50 MG tablet Take 1 tablet (50 mg total) by mouth at bedtime. 90 tablet 3   No current facility-administered medications for this visit.     Psychiatric Specialty Exam: Review of Systems  Psychiatric/Behavioral: Positive for depression. The patient is nervous/anxious and has insomnia.   All other systems reviewed and are negative.   There  were no vitals taken for this visit.There is no height or weight on file to calculate BMI.  General Appearance: Casual and Well Groomed  Eye Contact:  Good  Speech:  Clear and Coherent and Normal Rate  Volume:  Normal  Mood:  Anxious and Depressed  Affect:  Full Range  Thought Process:  Goal Directed and Linear  Orientation:  Full (Time, Place, and Person)  Thought Content:  Logical and Rumination  Suicidal Thoughts:  No  Homicidal Thoughts:  No  Memory:  Immediate;   Good Recent;   Good Remote;   Good  Judgement:  Good  Insight:  Fair  Psychomotor Activity:  Normal  Concentration:  Concentration: Fair  Recall:  Good  Fund of Knowledge:Good  Language: Good  Akathisia:  Negative  Handed:  Right  AIMS (if indicated):  not done  Assets:  Communication Skills Desire for Improvement Financial Resources/Insurance Housing Physical Health Talents/Skills  ADL's:  Intact  Cognition: WNL  Sleep:  Poor   Screenings: PHQ2-9     Office Visit from [facility]/02/2019 in [facility] [place] Office Visit from 12/01/2018 in [facility] [place] Office Visit from [facility]/05/2017 in [facility] [place]  Office Visit from 05/29/2016 [facility] Office Visit from 02/24/2016 in Primary Care at [place]  PHQ-2 Total Score  0  0  0  0  0  PHQ-9 Total Score  1  1  -  -  -      Assessment and Plan: 48 yo married male with a hx of depression/anxiety in a setting of marital conflicts. He has become more depressed, anxious, worried, not sleeping well and losing weight (no appetite) after his wife of 21 years Shane [name] [name] decided to split and left their home on August 3rd. This is the third time she did that with a first time in 2013 when he was excessively drinking alcohol.  Shane [name] HewsShane went to AA and achieved sobriety in March 2014. During that time he became depressed and was started on Wellbutrin 150 mg bid. It helped with mood some but caused decline in libido so he eventually stopped taking it. He has now been on a low 50 mg dose of sertraline for about a year 0 unclear benefit. Reports ongoing problems with sleep - sleeps about 3-4 hours, ruminates iwhile in bed, worries about his marriage. He was in [facility] ED on 07/03/19 - brought by his boss because of text messages which could be interpreted as expressing SI. He denies having SI then and now - feels that he was asking for help this way. He has no prior hx of SI/attempts, no hx of inpatient psychiatric admissions. He denies having hx of mania or psychosis. Of note is that patient's wife is dx and treated for bipolar disorder. Shane [name] HewsShane has a hx of problems with concentration, task completion, procrastination since middle school. He was formally diagnosed with ADD in 2014 and started on Adderall - it is prescribed bid but he typically only takes it in am. He has no hx of illicit or prescribed drug abuse.  Dx: Major depressive disorder recurrent moderate; Adult ADD; Alcohol use disorder moderate in sustained remission  Plan: We will increase sertraline to 100 mg daily and add mirtazapine 15 mg at HS for sleep/appetite/mood. I recommended continuing Adderall unchanged  despite loss of appetite - it did not cause it in the past and worsening focusing should he come off it will only increase his anxiety. Shane [name] HewsShane will start counseling with  Vonna Kotyk Sheets  This Friday. Next appointment with me in 5 weeks. The plan was discussed with patient who had an opportunity to ask questions and these were all answered. I spend 60 minutes in videoconferencing with the patient and devoted approximately 50% of this time to explanation of diagnosis, discussion of treatment options and med education.  Stephanie Acre, MD 10/7/20209:33 AM

## 2019-07-13 ENCOUNTER — Encounter (HOSPITAL_COMMUNITY): Payer: Self-pay | Admitting: Licensed Clinical Social Worker

## 2019-07-13 ENCOUNTER — Ambulatory Visit (INDEPENDENT_AMBULATORY_CARE_PROVIDER_SITE_OTHER): Payer: 59 | Admitting: Licensed Clinical Social Worker

## 2019-07-13 ENCOUNTER — Other Ambulatory Visit: Payer: Self-pay

## 2019-07-13 DIAGNOSIS — F331 Major depressive disorder, recurrent, moderate: Secondary | ICD-10-CM

## 2019-07-13 DIAGNOSIS — F988 Other specified behavioral and emotional disorders with onset usually occurring in childhood and adolescence: Secondary | ICD-10-CM | POA: Diagnosis not present
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
## 2019-07-31 ENCOUNTER — Telehealth (HOSPITAL_COMMUNITY): Payer: Self-pay

## 2019-07-31 NOTE — Telephone Encounter (Signed)
Patient's wife called and stated that since the increase in his Sertraline he's had some really bad days. She stated that some days are ok and that his days get bad right around 3:00. She stated that he's really down on himself. She also stated that he was taken for an evaluation at [facility] in September due to suicidal thoughts and are still having those thoughts. She relayed to me that they are separating but I expressed to her that he should not be left alone right now. She stated that he's working and that she will contact his mother to have her stop by frequently. Please review and advise. Thank you.

## 2019-08-10 NOTE — Telephone Encounter (Signed)
Patient has scheduled appointments now with Dr. Montel Culver on 08/15/19 and 08/17/19 with Glori Bickers to discuss his issues

## 2019-08-15 ENCOUNTER — Ambulatory Visit (INDEPENDENT_AMBULATORY_CARE_PROVIDER_SITE_OTHER): Payer: 59 | Admitting: Psychiatry

## 2019-08-15 ENCOUNTER — Other Ambulatory Visit: Payer: Self-pay

## 2019-08-15 DIAGNOSIS — F900 Attention-deficit hyperactivity disorder, predominantly inattentive type: Secondary | ICD-10-CM

## 2019-08-15 DIAGNOSIS — F331 Major depressive disorder, recurrent, moderate: Secondary | ICD-10-CM | POA: Diagnosis not present

## 2019-08-15 MED ORDER — SERTRALINE HCL 100 MG PO TABS: 100.0000 mg | ORAL_TABLET | Freq: Every day | ORAL | 1 refills | Status: DC

## 2019-08-15 MED ORDER — MIRTAZAPINE 15 MG PO TABS: 15.0000 mg | ORAL_TABLET | Freq: Every day | ORAL | 1 refills | Status: DC

## 2019-08-15 NOTE — Progress Notes (Signed)
BH MD/PA/NP OP Progress Note  08/15/2019 4:48 PM MAYCOL HOYING  MRN:  [id number] Interview was conducted by phone and I verified that I was speaking with the correct person using two identifiers. I discussed the limitations of evaluation and management by telemedicine and  the availability of in person appointments. Patient expressed understanding and agreed to proceed.  Chief Complaint: "I feel a bit better".  HPI: 48 yo married male with a hx of depression/anxiety in a setting of marital conflicts. He has become more depressed, anxious, worried, not sleeping well and losing weight (no appetite) after his wife of 21 years Lyla Son decided to split and left their home on August 3rd. This is the third time she did that with a first time in 2013 when he was excessively drinking alcohol.  Vincenza Hews went to AA and achieved sobriety in March 2014. During that time he became depressed and was started on Wellbutrin 150 mg bid. It helped with mood some but caused decline in libido so he eventually stopped taking it. He has now been on a low 50 mg dose of sertraline for about a year 0 unclear benefit. Reports ongoing problems with sleep - sleeps about 3-4 hours, ruminates iwhile in bed, worries about his marriage. He was in [facility] ED on 07/03/19 - brought by his boss because of text messages which could be interpreted as expressing SI. He denies having SI then and now - feels that he was asking for help this way. He has no prior hx of SI/attempts, no hx of inpatient psychiatric admissions. He denies having hx of mania or psychosis. Of note is that patient's wife is dx and treated for bipolar disorder. Vincenza Hews has a hx of problems with concentration, task completion, procrastination since middle school. He was formally diagnosed with ADD in 2014 and started on Adderall - it is prescribed bid but he typically only takes it in am. He has no hx of illicit or prescribed drug abuse. Vincenza Hews is on 100 mg of sertraline and we added  15 mg of mirtazapine for sleep/appetite. These have improved, he continues to deny feeling suicidal and actually come to realize that he should back off trying "to fix things " with his wife. WE spoke about acceptance, being supportive of one another and being more realistic in our expectations. He will continue to work on his approach to marital relationship with Bynum Bellows with whom he has an appointment later this week.  Visit Diagnosis:    ICD-10-CM   1. Major depressive disorder, recurrent episode, moderate (HCC)  F33.1 sertraline (ZOLOFT) 100 MG tablet  2. Attention deficit hyperactivity disorder (ADHD), predominantly inattentive type  F90.0     Past Psychiatric History: Please see intake H&P.  Past Medical History:  Past Medical History:  Diagnosis Date  . ADD (attention deficit disorder)   . Alcohol abuse    History of.  . Anxiety   . Depression   . Hyperlipidemia   . Hypertension     Past Surgical History:  Procedure Laterality Date  . TENDON REPAIR Right 09/15/2015   Procedure: RIGHT FOREARM EXPLORATION AND CLOSURE OF LACERATION, TENDON REPAIR;  Surgeon: Bradly Bienenstock, MD;  Location: [facility] OR;  Service: Orthopedics;  Laterality: Right;    Family Psychiatric History: Reviewed.  Family History:  Family History  Problem Relation Age of Onset  . Prostate cancer Father   . Hyperlipidemia Father   . Hypertension Father   . Lung cancer Maternal Grandfather   . Heart attack  Maternal Grandfather [id number]       died  . Alcohol abuse Paternal Grandfather   . Hypertension Cousin   . Hyperlipidemia Other   . Heart attack Paternal Uncle 52       died  . Heart attack Maternal Great-grandfather 71  . Alcohol abuse Maternal Aunt     Social History:  Social History   Socioeconomic History  . Marital status: Married    Spouse name: Teressa LowerCarrie Holzheimer  . Number of children: 2  . Years of education: [id number]  . Highest education level: Not on file  Occupational History    Comment:  [employer]  Social Needs  . Financial resource strain: Not on file  . Food insecurity    Worry: Not on file    Inability: Not on file  . Transportation needs    Medical: Not on file    Non-medical: Not on file  Tobacco Use  . Smoking status: Current Every Day Smoker    Packs/day: 0.25  . Smokeless tobacco: Never Used  Substance and Sexual Activity  . Alcohol use: No    Comment: no alcohol since 12/16/2012  . Drug use: No  . Sexual activity: Yes    Partners: Female  Lifestyle  . Physical activity    Days per week: Not on file    Minutes per session: Not on file  . Stress: Not on file  Relationships  . Social Musicianconnections    Talks on phone: Not on file    Gets together: Not on file    Attends religious service: Not on file    Active member of club or organization: Not on file    Attends meetings of clubs or organizations: Not on file    Relationship status: Not on file  Other Topics Concern  . Not on file  Social History Narrative   Lives with his wife and their two daughters (17 and [phone] as of 10/07/17)   Rebuild induction coils for work   Former smoker x 5-6 years 2 packs per week quit    Allergies: No Known Allergies  Metabolic Disorder Labs: No results found for: HGBA1C, MPG No results found for: PROLACTIN Lab Results  Component Value Date   CHOL 220 (H) 12/05/2017   TRIG 100.0 12/05/2017   HDL 50.00 12/05/2017   CHOLHDL 4 12/05/2017   VLDL 20.0 12/05/2017   LDLCALC 150 (H) 12/05/2017   LDLCALC 142 (H) 05/29/2016   Lab Results  Component Value Date   TSH 2.15 12/05/2017   TSH 1.995 12/17/2012    Therapeutic Level Labs: No results found for: LITHIUM No results found for: VALPROATE No components found for:  CBMZ  Current Medications: Current Outpatient Medications  Medication Sig Dispense Refill  . amLODipine (NORVASC) 2.5 MG tablet Take 1 tablet (2.5 mg total) by mouth daily. 90 tablet 2  . amphetamine-dextroamphetamine (ADDERALL) 20 MG  tablet Take 1 tablet (20 mg total) by mouth 2 (two) times daily. 60 tablet 0  . hydrocortisone 2.5 % cream Apply topically 2 (two) times daily. Left ear 30 g 2  . mirtazapine (REMERON) 15 MG tablet Take 1 tablet (15 mg total) by mouth at bedtime. 30 tablet 1  . mupirocin ointment (BACTROBAN) 2 % Apply 1 application topically 2 (two) times daily. 30 g 2  . sertraline (ZOLOFT) 100 MG tablet Take 1 tablet (100 mg total) by mouth daily. 30 tablet 1   No current facility-administered medications for this visit.  Psychiatric Specialty Exam: Review of Systems  Psychiatric/Behavioral: Positive for depression. The patient has insomnia.   All other systems reviewed and are negative.   There were no vitals taken for this visit.There is no height or weight on file to calculate BMI.  General Appearance: NA  Eye Contact:  NA  Speech:  Clear and Coherent and Normal Rate  Volume:  Normal  Mood:  Anxious and Depressed  Affect:  NA  Thought Process:  Goal Directed and Linear  Orientation:  Full (Time, Place, and Person)  Thought Content: Logical   Suicidal Thoughts:  No  Homicidal Thoughts:  No  Memory:  Immediate;   Good Recent;   Good Remote;   Good  Judgement:  Good  Insight:  Good  Psychomotor Activity:  NA  Concentration:  Concentration: Good  Recall:  Good  Fund of Knowledge: Good  Language: Good  Akathisia:  Negative  Handed:  Right  AIMS (if indicated): not done  Assets:  Desire for Improvement Financial Resources/Insurance Housing Physical Health Resilience Talents/Skills  ADL's:  Intact  Cognition: WNL  Sleep:  Fair   Screenings: PHQ2-9     Office Visit from 03/02/2019 in [facility] [place] Office Visit from 12/01/2018 in [facility] [place] Office Visit from 12/05/2017 in [facility] [place] Office Visit from 05/29/2016 in Primary Care at [facility] Visit from 02/24/2016 in [facility] at [place]  PHQ-2 Total Score  0  0  0  0  0   PHQ-9 Total Score  1  1  -  -  -       Assessment and Plan: 48 yo married male with a hx of depression/anxiety in a setting of marital conflicts. He has become more depressed, anxious, worried, not sleeping well and losing weight (no appetite) after his wife of 21 years Lyla Son decided to split and left their home on August 3rd. This is the third time she did that with a first time in 2013 when he was excessively drinking alcohol.  Vincenza Hews went to AA and achieved sobriety in March 2014. During that time he became depressed and was started on Wellbutrin 150 mg bid. It helped with mood some but caused decline in libido so he eventually stopped taking it. He has now been on a low 50 mg dose of sertraline for about a year 0 unclear benefit. Reports ongoing problems with sleep - sleeps about 3-4 hours, ruminates iwhile in bed, worries about his marriage. He was in [facility] ED on 07/03/19 - brought by his boss because of text messages which could be interpreted as expressing SI. He denies having SI then and now - feels that he was asking for help this way. He has no prior hx of SI/attempts, no hx of inpatient psychiatric admissions. He denies having hx of mania or psychosis. Of note is that patient's wife is dx and treated for bipolar disorder. Vincenza Hews has a hx of problems with concentration, task completion, procrastination since middle school. He was formally diagnosed with ADD in 2014 and started on Adderall - it is prescribed bid but he typically only takes it in am. He has no hx of illicit or prescribed drug abuse. Vincenza Hews is on 100 mg of sertraline and we added 15 mg of mirtazapine for sleep/appetite. These have improved, he continues to deny feeling suicidal and actually come to realize that he should back off trying "to fix things " with his wife. WE spoke about acceptance, being supportive of one  another and being more realistic in our expectations. He will continue to work on his approach to marital relationship with  Glori Bickers with whom he has an appointment later this week.  Dx: Major depressive disorder recurrent moderate; Adult ADD; Alcohol use disorder moderate in sustained remission  Plan: Continue sertraline to 100 mg daily, mirtazapine 15 mg at HS for sleep/appetite/mood and Adderall 20 mg bid. Next appointment with me in 5 weeks. The plan was discussed with patient who had an opportunity to ask questions and these were all answered. I spend 25 minutes in phone consultation with the patient.   Stephanie Acre, MD 08/15/2019, 4:48 PM

## 2019-08-17 ENCOUNTER — Other Ambulatory Visit: Payer: Self-pay

## 2019-08-17 ENCOUNTER — Ambulatory Visit (INDEPENDENT_AMBULATORY_CARE_PROVIDER_SITE_OTHER): Payer: 59 | Admitting: Licensed Clinical Social Worker

## 2019-08-17 DIAGNOSIS — F331 Major depressive disorder, recurrent, moderate: Secondary | ICD-10-CM | POA: Diagnosis not present

## 2019-08-19 ENCOUNTER — Encounter (HOSPITAL_COMMUNITY): Payer: Self-pay | Admitting: Licensed Clinical Social Worker

## 2019-08-19 NOTE — Progress Notes (Signed)
Virtual Visit via Video Note  I connected with Shane [name] on 08/19/19 at 11:00 AM EST by a video enabled telemedicine application and verified that I am speaking with the correct person using two identifiers.  Location: Patient: Home Provider: Office   I discussed the limitations of evaluation and management by telemedicine and the availability of in person appointments. The patient expressed understanding and agreed to proceed.   THERAPIST PROGRESS NOTE  Session Time: 11:00 am-11:45 am  Participation Level: Active  Behavioral Response: CasualAlertDepressed  Type of Therapy: Individual Therapy  Treatment Goals addressed: Coping  Interventions: CBT and Solution Focused  Summary: Shane [name] is a 48 y.o. male who presents oriented x5 (person, place, situation, time, and object), casually dressed, appropriately groomed, average height, average weight, and cooperative to address mood. Patient has a history of medical treatment including hypertension and asthma. Patient has minimal history of mental health treatment. Patient denies suicidal and homicidal ideations. He does admit to somewhat passive thoughts of SI that got him to the hospital for an evaluation. Patient denies psychosis including auditory and visual hallucinations. Patient denies substance abuse. He is at low risk for lethality at this time.   Physically: Patient is tired. He feels fatigued throughout the day. He doesn't feel like doing much.  Spiritually/values: None identified.  Relationships: Patient is struggling with his wife. He is very open and in touch with his feelings about his relationship as well as hopes for his relationship with his wife. He wants to share this with her constantly. Patient was able to recognize that when he is open with his feelings then his wife gets anxious, shuts down, and then patient thoughts spiral down into negative thoughts. Patient understood that his wife knows where he  stands with their relationship and that he needs to take a step back with sharing the thoughts about the relationship for now. Patient has also started to isolate. He feels like every time he spends time with his friends all they want to talk about his what is going on with his life. While he appreciates it, he just needs to spend time with them and not get a reminder of what is going on in his life. Patient agreed to talk to his friends about always checking in right now and letting them know that he needs a break from that but to please check in from time to time. Emotionally/Mentally/Behavior: Patient's mood was ok with depressive moments. Patient is trying to accept what he can't control. This is a process. He understands that he needs to focus on what he can control and avoid spinning his wheels on things he can't control. Patient was able to identify what he can control such as getting out of bed, exercising, having a better relationship with one of his daughters, etc. Patient agreed to work on acceptance and focus on what he can control.   Patient engaged in session. He responded well to interventions. Patient continues to meet criteria for Major depressive disorder, recurrent episode, moderate and ADHD. Patient will continue in outpatient therapy due to being the least restrictive service to meet his needs. Patient made minimal progress on his goals.   Suicidal/Homicidal: Nowithout intent/plan  Therapist Response: Therapist reviewed patient's recent thoughts and behaviors. Therapist utilized CBT to address mood. Therapist processed patient's feelings to identify triggers for mood. Therapist had patient identify what had gone well and discussed acceptance as well as how to apply that to patient's situation.   Plan: Return  again in 1 weeks.  Diagnosis: Axis I: Major depressive disorder, recurrent episode, moderate    Axis II: No diagnosis  I discussed the assessment and treatment plan with the  patient. The patient was provided an opportunity to ask questions and all were answered. The patient agreed with the plan and demonstrated an understanding of the instructions.   The patient was advised to call back or seek an in-person evaluation if the symptoms worsen or if the condition fails to improve as anticipated.  I provided 45 minutes of non-face-to-face time during this encounter.    Glori Bickers, LCSW 08/19/2019

## 2019-08-24 ENCOUNTER — Encounter (HOSPITAL_COMMUNITY): Payer: Self-pay | Admitting: Licensed Clinical Social Worker

## 2019-08-24 ENCOUNTER — Other Ambulatory Visit: Payer: Self-pay

## 2019-08-24 ENCOUNTER — Ambulatory Visit (INDEPENDENT_AMBULATORY_CARE_PROVIDER_SITE_OTHER): Payer: 59 | Admitting: Licensed Clinical Social Worker

## 2019-08-24 DIAGNOSIS — F331 Major depressive disorder, recurrent, moderate: Secondary | ICD-10-CM | POA: Diagnosis not present

## 2019-08-24 NOTE — Progress Notes (Signed)
Virtual Visit via Video Note  I connected with Shane [name] on 08/24/19 at 11:00 AM EST by a video enabled telemedicine application and verified that I am speaking with the correct person using two identifiers.  Location: Patient: Home Provider: Office   I discussed the limitations of evaluation and management by telemedicine and the availability of in person appointments. The patient expressed understanding and agreed to proceed.   THERAPIST PROGRESS NOTE  Session Time: 11:00 am-11:45 am  Participation Level: Active  Behavioral Response: CasualAlertDepressed  Type of Therapy: Individual Therapy  Treatment Goals addressed: Coping  Interventions: CBT and Solution Focused  Summary: Shane [name] is a 48 y.o. male who presents oriented x5 (person, place, situation, time, and object), casually dressed, appropriately groomed, average height, average weight, and cooperative to address mood. Patient has a history of medical treatment including hypertension and asthma. Patient has minimal history of mental health treatment. Patient denies suicidal and homicidal ideations. He does admit to somewhat passive thoughts of SI that got him to the hospital for an evaluation. Patient denies psychosis including auditory and visual hallucinations. Patient denies substance abuse. He is at low risk for lethality at this time.   Physically: Patient has not been doing much. He has been staying at home. He continues to go to work but doesn't do much else.  Spiritually/values: None identified.  Relationships: Patient got "clarity" on his relationship with his wife. He got a text from her and she basically told him what he did wrong and the relationship was over. Patient has been very hurt but is trying to accept things for what they are. Patient has not felt like he could be a good father to his children right now. He is still spending time with them but it is a struggle. Patient told his wife a few  days after her text that he needed space and couldn't have constant communication with her. He still communicates with her related to the children.  Emotionally/Mentally/Behavior: Patient's mood was depressed. He is feeling down about the relationship with his wife. Patient feels like is is barely functioning. After discussion, patient understood that right now, good enough is good enough. He is doing all the he can right now and he doesn't have to do much more for now.   Patient engaged in session. He responded well to interventions. Patient continues to meet criteria for Major depressive disorder, recurrent episode, moderate and ADHD. Patient will continue in outpatient therapy due to being the least restrictive service to meet his needs. Patient made minimal progress on his goals.   Suicidal/Homicidal: Nowithout intent/plan  Therapist Response: Therapist reviewed patient's recent thoughts and behaviors. Therapist utilized CBT to address mood. Therapist processed patient's feelings to identify triggers for mood. Therapist discussed his relationship with his wife, setting boundaries, and taking things day by day.    Plan: Return again in 1 weeks.  Diagnosis: Axis I: Major depressive disorder, recurrent episode, moderate    Axis II: No diagnosis  I discussed the assessment and treatment plan with the patient. The patient was provided an opportunity to ask questions and all were answered. The patient agreed with the plan and demonstrated an understanding of the instructions.   The patient was advised to call back or seek an in-person evaluation if the symptoms worsen or if the condition fails to improve as anticipated.  I provided 45 minutes of non-face-to-face time during this encounter.    Glori Bickers, LCSW 08/24/2019

## 2019-09-07 ENCOUNTER — Encounter (HOSPITAL_COMMUNITY): Payer: Self-pay | Admitting: Licensed Clinical Social Worker

## 2019-09-07 ENCOUNTER — Ambulatory Visit (INDEPENDENT_AMBULATORY_CARE_PROVIDER_SITE_OTHER): Payer: 59 | Admitting: Licensed Clinical Social Worker

## 2019-09-07 ENCOUNTER — Other Ambulatory Visit: Payer: Self-pay

## 2019-09-07 DIAGNOSIS — F331 Major depressive disorder, recurrent, moderate: Secondary | ICD-10-CM | POA: Diagnosis not present

## 2019-09-07 NOTE — Progress Notes (Signed)
Virtual Visit via Video Note  I connected with Shane [name] on 09/07/19 at 11:00 AM EST by a video enabled telemedicine application and verified that I am speaking with the correct person using two identifiers.  Location: Patient: Home Provider: Office   I discussed the limitations of evaluation and management by telemedicine and the availability of in person appointments. The patient expressed understanding and agreed to proceed.   THERAPIST PROGRESS NOTE  Session Time: 11:00 am-11:45 am  Participation Level: Active  Behavioral Response: CasualAlertDepressed  Type of Therapy: Individual Therapy  Treatment Goals addressed: Coping  Interventions: CBT and Solution Focused  Summary: Shane [name] is a 48 y.o. male who presents oriented x5 (person, place, situation, time, and object), casually dressed, appropriately groomed, average height, average weight, and cooperative to address mood. Patient has a history of medical treatment including hypertension and asthma. Patient has minimal history of mental health treatment. Patient denies suicidal and homicidal ideations. He does admit to somewhat passive thoughts of SI that got him to the hospital for an evaluation. Patient denies psychosis including auditory and visual hallucinations. Patient denies substance abuse. He is at low risk for lethality at this time.   Physically: Patient is doing minimal physical activity.  OSpiritually/values: Patient is worried about maintaining his faith when it feels like his world is falling apart around him.  Relationships: Patient is still hurting over the relationship with his wife. He feels like he can't move forward without getting an understanding of what caused her to want to end the relationship. Patient could only identify that he drank heavily around 2013. He had quit his job of over 20 years to be with his family more. He was not working and got depressed about it. He was able to continue  to provide from the money that he had gotten from his job. He drank more and they argued more due it never got physical. He went to AA and got sober but she never let him forget it. She had kicked him out before he got sober. Patient also noted that his wife's father was an alcoholic and patient felt like he was on trial for his wife's father's sins. Patient noted that his wife's father drank and was abusive to her mother and to the patient's wife. Patient also noted that he went to see his family on Thanksgiving. He felt like being around his family reflected what was missing in his life but he stayed at Thanksgiving dinner despite feeling that way. Patient also noted that he even went back the day after to spend time with family. During the last session, patient was dreading Thanksgiving and decided that if being with family was too much he would leave after half an hour. Patient felt uncomfortable but stayed despite that. Patient also noted that was able to stop and see a friend over the last week as well.  Emotionally/Mentally/Behavior: Patient's mood was depressed. Patient is continuing to transition with the ending of his marriage. Patient noted that he has continued to show up to work and show up for his children. Patient noted that after the session, he was going to call a friend and see if they could play disc golf which is something that he has not done yet. Patient also agreed to identify things he is grateful for. After discussion, patient understood the connection between gratitude and happiness.   Patient engaged in session. He responded well to interventions. Patient continues to meet criteria for Major depressive disorder,  recurrent episode, moderate and ADHD. Patient will continue in outpatient therapy due to being the least restrictive service to meet his needs. Patient made minimal progress on his goals.   Suicidal/Homicidal: Nowithout intent/plan  Therapist Response: Therapist reviewed  patient's recent thoughts and behaviors. Therapist utilized CBT to address mood. Therapist processed patient's feelings to identify triggers for mood. Therapist discussed his relationship with his wife, keeping his routines, and identifying something to be grateful for on a daily basis.    Plan: Return again in 1 weeks.  Diagnosis: Axis I: Major depressive disorder, recurrent episode, moderate    Axis II: No diagnosis  I discussed the assessment and treatment plan with the patient. The patient was provided an opportunity to ask questions and all were answered. The patient agreed with the plan and demonstrated an understanding of the instructions.   The patient was advised to call back or seek an in-person evaluation if the symptoms worsen or if the condition fails to improve as anticipated.  I provided 45 minutes of non-face-to-face time during this encounter.    Glori Bickers, LCSW 09/07/2019

## 2019-09-14 ENCOUNTER — Ambulatory Visit (INDEPENDENT_AMBULATORY_CARE_PROVIDER_SITE_OTHER): Payer: 59 | Admitting: Licensed Clinical Social Worker

## 2019-09-14 ENCOUNTER — Encounter (HOSPITAL_COMMUNITY): Payer: Self-pay | Admitting: Licensed Clinical Social Worker

## 2019-09-14 ENCOUNTER — Other Ambulatory Visit: Payer: Self-pay

## 2019-09-14 DIAGNOSIS — F331 Major depressive disorder, recurrent, moderate: Secondary | ICD-10-CM

## 2019-09-14 NOTE — Progress Notes (Signed)
Virtual Visit via Video Note  I connected with Shane [name] on 09/14/19 at 11:00 AM EST by a video enabled telemedicine application and verified that I am speaking with the correct person using two identifiers.  Location: Patient: Home Provider: Office   I discussed the limitations of evaluation and management by telemedicine and the availability of in person appointments. The patient expressed understanding and agreed to proceed.   THERAPIST PROGRESS NOTE  Session Time: 11:00 am-11:45 am  Participation Level: Active  Behavioral Response: CasualAlertDepressed  Type of Therapy: Individual Therapy  Treatment Goals addressed: Coping  Interventions: CBT and Solution Focused  Summary: Shane [name] is a 48 y.o. male who presents oriented x5 (person, place, situation, time, and object), casually dressed, appropriately groomed, average height, average weight, and cooperative to address mood. Patient has a history of medical treatment including hypertension and asthma. Patient has minimal history of mental health treatment. Patient denies suicidal and homicidal ideations. He does admit to somewhat passive thoughts of SI that got him to the hospital for an evaluation. Patient denies psychosis including auditory and visual hallucinations. Patient denies substance abuse. He is at low risk for lethality at this time.   Physically: Patient is trying to be more active. He is wanting to get outside of the home more and plans to do that going forward.  Spiritually/values: Patient is feeling more spiritually healthy than he did a week ago.  Relationships: Patient made an important decision early in the week. He decided to cut off all communication with his wife and her family. He blocked her number and removed everyone related to her from his facebook. It was a difficult decision but patient has felt much relief. He recognized that he was only hearing negative from his wife which would cause  him to spiral. Patient also admitted that he was negative toward his wife and that impacted her healing as well. He noted that his friends and co-workers noticed an improvement in his mood since doing this. Patient feels like he can still work on being the best father, son, brother, employee, and neighbor that he can now.  Emotionally/Mentally/Behavior: Patient's mood has improved. He feels a lot of relief since he has stopped communication with his wife and focused on his own healing. Patient has heard two different quotes that confirm to him that he has made the right decision. He is getting these small confirmations throughout the week. Patient admitted that he did have a moment this week where negative thoughts started to creep in. He said that last hour of work was bad for him so he came home and took a long shower. This was symbolic for him as he "washed off the day" including his worries and felt much better after. He also noted that he has been trying to text his daughters after work prior to his after work shower. He has developed a daily "ritual" to help him transition from work and get the negative thoughts out. Patient is going to start new hobbies as well. He saw a television show call the "[facility]" that was about finding a hidden treasure. He looked up information about a local lost treasure and is planning on looking for a bronze cannon from the revolutionary war that is allegedly buried in a field locally.   Patient engaged in session. He responded well to interventions. Patient continues to meet criteria for Major depressive disorder, recurrent episode, moderate and ADHD. Patient will continue in outpatient therapy due  to being the least restrictive service to meet his needs. Patient made minimal progress on his goals.   Suicidal/Homicidal: Nowithout intent/plan  Therapist Response: Therapist reviewed patient's recent thoughts and behaviors. Therapist utilized CBT to address mood.  Therapist processed patient's feelings to identify triggers for mood. Therapist discussed his relationship with his wife, and improvements that have occurred over the week.     Plan: Return again in 1 weeks.  Diagnosis: Axis I: Major depressive disorder, recurrent episode, moderate    Axis II: No diagnosis  I discussed the assessment and treatment plan with the patient. The patient was provided an opportunity to ask questions and all were answered. The patient agreed with the plan and demonstrated an understanding of the instructions.   The patient was advised to call back or seek an in-person evaluation if the symptoms worsen or if the condition fails to improve as anticipated.  I provided 45 minutes of non-face-to-face time during this encounter.    Bynum Bellows, LCSW 09/14/2019

## 2019-09-25 ENCOUNTER — Ambulatory Visit (INDEPENDENT_AMBULATORY_CARE_PROVIDER_SITE_OTHER): Payer: 59 | Admitting: Psychiatry

## 2019-09-25 ENCOUNTER — Other Ambulatory Visit: Payer: Self-pay

## 2019-09-25 DIAGNOSIS — F330 Major depressive disorder, recurrent, mild: Secondary | ICD-10-CM

## 2019-09-25 DIAGNOSIS — F988 Other specified behavioral and emotional disorders with onset usually occurring in childhood and adolescence: Secondary | ICD-10-CM | POA: Diagnosis not present

## 2019-09-25 MED ORDER — SERTRALINE HCL 100 MG PO TABS: 100.0000 mg | ORAL_TABLET | Freq: Every day | ORAL | 0 refills | Status: DC

## 2019-09-25 MED ORDER — MIRTAZAPINE 15 MG PO TABS: 15.0000 mg | ORAL_TABLET | Freq: Every day | ORAL | 0 refills | Status: DC

## 2019-09-25 MED ORDER — AMPHETAMINE-DEXTROAMPHETAMINE 20 MG PO TABS: 20.0000 mg | ORAL_TABLET | Freq: Two times a day (BID) | ORAL | 0 refills | Status: DC

## 2019-09-25 NOTE — Progress Notes (Signed)
BH MD/PA/NP OP Progress Note  09/25/2019 3:44 PM Shane [name]  MRN:  [id number] Interview was conducted by phone and I verified that I was speaking with the correct person using two identifiers. I discussed the limitations of evaluation and management by telemedicine and  the availability of in person appointments. Patient expressed understanding and agreed to proceed.  Chief Complaint: "I am a little better".  HPI: 48 yo married male with a hx of depression/anxiety in a setting of marital conflicts. He has become more depressed, anxious, worried, not sleeping well and losing weight (no appetite) after his wife of 21 years Lyla Son decided to split and left their home on August 3rd. He has not spoken with her for two weeks. This is the third time she did that with a first time in 2013 when he was excessively drinking alcohol.Of note is that patient's wife is dx and treated for bipolar disorder. Vincenza Hews went to [facility] and achieved sobriety in March 2014. During that time he became depressed and was started on Wellbutrin 150 mg bid. It helped with mood some but caused decline in libido so he eventually stopped taking it. He has now been on sertraline for about a year - dose was increased to 100 mg and mirtazapine added. His mood has improved and so is sleep and appetite. He was in [facility] ED on 07/03/19 - brought by his boss because of text messages which could be interpreted as expressing SI. He denies having SI then and now - feels that he was asking for help this way. He has no prior hx of SI/attempts, no hx of inpatient psychiatric admissions. He denies having hx of mania or psychosis.  Vincenza Hews has a hx of problems with concentration, task completion, procrastination since middle school. He was formally diagnosed with ADD in 2014 and started on Adderall - it is prescribed bid but he typically only takes it in am. He has no hx of illicit or prescribed drug abuse. He will continue to work on his approach to marital  relationship with Bynum Bellows.   Visit Diagnosis:    ICD-10-CM   1. Attention deficit disorder, unspecified hyperactivity presence  F98.8   2. Mild episode of recurrent major depressive disorder (HCC)  F33.0 sertraline (ZOLOFT) 100 MG tablet    Past Psychiatric History: Please see intake H&P.  Past Medical History:  Past Medical History:  Diagnosis Date  . ADD (attention deficit disorder)   . Alcohol abuse    History of.  . Anxiety   . Depression   . Hyperlipidemia   . Hypertension     Past Surgical History:  Procedure Laterality Date  . TENDON REPAIR Right 09/15/2015   Procedure: RIGHT FOREARM EXPLORATION AND CLOSURE OF LACERATION, TENDON REPAIR;  Surgeon: Bradly Bienenstock, MD;  Location: [facility] OR;  Service: Orthopedics;  Laterality: Right;    Family Psychiatric History: Reviewed.  Family History:  Family History  Problem Relation Age of Onset  . Prostate cancer Father   . Hyperlipidemia Father   . Hypertension Father   . Lung cancer Maternal Grandfather   . Heart attack Maternal Grandfather 66       died  . Alcohol abuse Paternal Grandfather   . Hypertension Cousin   . Hyperlipidemia Other   . Heart attack Paternal Uncle 52       died  . Heart attack Maternal Great-grandfather 71  . Alcohol abuse Maternal Aunt     Social History:  Social History   Socioeconomic History  .  Marital status: Married    Spouse name: Dare Sanger  . Number of children: 2  . Years of education: 70  . Highest education level: Not on file  Occupational History    Comment: [employer]  Tobacco Use  . Smoking status: Current Every Day Smoker    Packs/day: 0.25  . Smokeless tobacco: Never Used  Substance and Sexual Activity  . Alcohol use: No    Comment: no alcohol since 12/16/2012  . Drug use: No  . Sexual activity: Yes    Partners: Female  Other Topics Concern  . Not on file  Social History Narrative   Lives with his wife and their two daughters (44 and 27 as of  10/07/17)   Rebuild induction coils for work   Former smoker x 5-6 years 2 packs per week quit   Social Determinants of Corporate investment banker Strain:   . Difficulty of Paying Living Expenses: Not on file  Food Insecurity:   . Worried About Programme researcher, broadcasting/film/video in the Last Year: Not on file  . Ran Out of Food in the Last Year: Not on file  Transportation Needs:   . Lack of Transportation (Medical): Not on file  . Lack of Transportation (Non-Medical): Not on file  Physical Activity:   . Days of Exercise per Week: Not on file  . Minutes of Exercise per Session: Not on file  Stress:   . Feeling of Stress : Not on file  Social Connections:   . Frequency of Communication with Friends and Family: Not on file  . Frequency of Social Gatherings with Friends and Family: Not on file  . Attends Religious Services: Not on file  . Active Member of Clubs or Organizations: Not on file  . Attends Banker Meetings: Not on file  . Marital Status: Not on file    Allergies: No Known Allergies  Metabolic Disorder Labs: No results found for: HGBA1C, MPG No results found for: PROLACTIN Lab Results  Component Value Date   CHOL 220 (H) 12/05/2017   TRIG 100.0 12/05/2017   HDL 50.00 12/05/2017   CHOLHDL 4 12/05/2017   VLDL 20.0 12/05/2017   LDLCALC 150 (H) 12/05/2017   LDLCALC 142 (H) 05/29/2016   Lab Results  Component Value Date   TSH 2.15 12/05/2017   TSH 1.995 12/17/2012    Therapeutic Level Labs: No results found for: LITHIUM No results found for: VALPROATE No components found for:  CBMZ  Current Medications: Current Outpatient Medications  Medication Sig Dispense Refill  . amLODipine (NORVASC) 2.5 MG tablet Take 1 tablet (2.5 mg total) by mouth daily. 90 tablet 2  . amphetamine-dextroamphetamine (ADDERALL) 20 MG tablet Take 1 tablet (20 mg total) by mouth 2 (two) times daily. 60 tablet 0  . [START ON 10/25/2019] amphetamine-dextroamphetamine (ADDERALL) 20 MG tablet  Take 1 tablet (20 mg total) by mouth 2 (two) times daily. 60 tablet 0  . [START ON 11/24/2019] amphetamine-dextroamphetamine (ADDERALL) 20 MG tablet Take 1 tablet (20 mg total) by mouth 2 (two) times daily. 60 tablet 0  . hydrocortisone 2.5 % cream Apply topically 2 (two) times daily. Left ear 30 g 2  . mirtazapine (REMERON) 15 MG tablet Take 1 tablet (15 mg total) by mouth at bedtime. 90 tablet 0  . mupirocin ointment (BACTROBAN) 2 % Apply 1 application topically 2 (two) times daily. 30 g 2  . sertraline (ZOLOFT) 100 MG tablet Take 1 tablet (100 mg total) by mouth  daily. 90 tablet 0   No current facility-administered medications for this visit.    Psychiatric Specialty Exam: Review of Systems  Psychiatric/Behavioral: The patient is nervous/anxious.   All other systems reviewed and are negative.   There were no vitals taken for this visit.There is no height or weight on file to calculate BMI.  General Appearance: NA  Eye Contact:  NA  Speech:  Clear and Coherent and Normal Rate  Volume:  Normal  Mood:  Anxious and Depressed  Affect:  NA  Thought Process:  Goal Directed and Linear  Orientation:  Full (Time, Place, and Person)  Thought Content: Logical   Suicidal Thoughts:  No  Homicidal Thoughts:  No  Memory:  Immediate;   Good Recent;   Good Remote;   Good  Judgement:  Good  Insight:  Good  Psychomotor Activity:  NA  Concentration:  Concentration: Good  Recall:  Good  Fund of Knowledge: Good  Language: Good  Akathisia:  Negative  Handed:  Right  AIMS (if indicated): not done  Assets:  Communication Skills Desire for Improvement Financial Resources/Insurance Housing Physical Health Resilience Talents/Skills  ADL's:  Intact  Cognition: WNL  Sleep:  Good   Screenings: PHQ2-9     Office Visit from 03/02/2019 in [facility] Office Visit from 12/01/2018 in [facility] Office Visit from 12/05/2017 in [facility]  Office Visit from 05/29/2016 in Primary [facility] at [facility] from 02/24/2016 in Primary [facility] at [facility]  PHQ-2 Total Score  0  0  0  0  0  PHQ-9 Total Score  1  1  --  --  --       Assessment and Plan: 48 yo married male with a hx of depression/anxiety in a setting of marital conflicts. He has become more depressed, anxious, worried, not sleeping well and losing weight (no appetite) after his wife of 21 years Shane [name] decided to split and left their home on August 3rd. He has not spoken with her for two weeks. This is the third time she did that with a first time in 2013 when he was excessively drinking alcohol.Of note is that patient's wife is dx and treated for bipolar disorder. [name] went to [place] and achieved sobriety in March 2014. During that time he became depressed and was started on Wellbutrin 150 mg bid. It helped with mood some but caused decline in libido so he eventually stopped taking it. He has now been on sertraline for about a year - dose was increased to 100 mg and mirtazapine added. His mood has improved and so is sleep and appetite. He was in [facility] ED on 07/03/19 - brought by his boss because of text messages which could be interpreted as expressing SI. He denies having SI then and now - feels that he was asking for help this way. He has no prior hx of SI/attempts, no hx of inpatient psychiatric admissions. He denies having hx of mania or psychosis.  [name] has a hx of problems with concentration, task completion, procrastination since middle school. He was formally diagnosed with ADD in 2014 and started on Adderall - it is prescribed bid but he typically only takes it in am. He has no hx of illicit or prescribed drug abuse. He will continue to work on his approach to marital relationship with Glori Bickers with whom he has an appointments every two weeks now.  Dx: Major depressive disorder recurrent mild; Adult ADD; Alcohol use  disorder moderate in sustained remission  Plan: Continue  sertraline to 100 mg daily, mirtazapine 15 mg at HS for sleep/appetite/mood and Adderall 20 mg bid. Next appointment with me in 5 weeks. The plan was discussed with patient who had an opportunity to ask questions and these were all answered. I spend6525minutes in phone consultation with the patient.    Magdalene Patricialgierd A Luie Laneve, MD 09/25/2019, 3:44 PM

## 2019-10-19 ENCOUNTER — Ambulatory Visit (INDEPENDENT_AMBULATORY_CARE_PROVIDER_SITE_OTHER): Payer: 59 | Admitting: Licensed Clinical Social Worker

## 2019-10-19 ENCOUNTER — Other Ambulatory Visit: Payer: Self-pay

## 2019-10-19 DIAGNOSIS — F331 Major depressive disorder, recurrent, moderate: Secondary | ICD-10-CM | POA: Diagnosis not present

## 2019-10-20 ENCOUNTER — Encounter (HOSPITAL_COMMUNITY): Payer: Self-pay | Admitting: Licensed Clinical Social Worker

## 2019-10-20 NOTE — Progress Notes (Signed)
Virtual Visit via Video Note  I connected with Shane [name] on 10/20/19 at  8:00 AM EST by a video enabled telemedicine application and verified that I am speaking with the correct person using two identifiers.  Location: Patient: Home Provider: Office   I discussed the limitations of evaluation and management by telemedicine and the availability of in person appointments. The patient expressed understanding and agreed to proceed.   THERAPIST PROGRESS NOTE  Session Time: 8:00 am-8:45 am  Participation Level: Active  Behavioral Response: CasualAlertDepressed  Type of Therapy: Individual Therapy  Treatment Goals addressed: Coping  Interventions: CBT and Solution Focused  Summary: Shane [name] is a 49 y.o. male who presents oriented x5 (person, place, situation, time, and object), casually dressed, appropriately groomed, average height, average weight, and cooperative to address mood. Patient has a history of medical treatment including hypertension and asthma. Patient has minimal history of mental health treatment. Patient denies suicidal and homicidal ideations. He does admit to somewhat passive thoughts of SI that got him to the hospital for an evaluation. Patient denies psychosis including auditory and visual hallucinations. Patient denies substance abuse. He is at low risk for lethality at this time.   Physically: Patient has not been active or sleeping very well.  Spiritually/values: Patient is working on being spiritually healthy. Relationships: Patient is having difficulty being around his wife. He did not go to Christmas due to worry that he would make it weird for his girls and his wife. Patient was starting to do well before Christmas and then it got difficult during the holidays.  Emotionally/Mentally/Behavior: Patient was feeling depressed again. He continues to hold out hope that he can work things out with his wife though she is expressing that it is over.  Patient was using this as an opportunity for a "reset." He is going to focus on what he can control and "stop swimming against the current." Patient is going to spend time with his daughter and enjoy the nice weather.   Patient engaged in session. He responded well to interventions. Patient continues to meet criteria for Major depressive disorder, recurrent episode, moderate and ADHD. Patient will continue in outpatient therapy due to being the least restrictive service to meet his needs. Patient made minimal progress on his goals.   Suicidal/Homicidal: Nowithout intent/plan  Therapist Response: Therapist reviewed patient's recent thoughts and behaviors. Therapist utilized CBT to address mood. Therapist processed patient's feelings to identify triggers for mood. Therapist discussed his relationship with his wife, and having a reset.  Plan: Return again in 1 weeks.  Diagnosis: Axis I: Major depressive disorder, recurrent episode, moderate    Axis II: No diagnosis  I discussed the assessment and treatment plan with the patient. The patient was provided an opportunity to ask questions and all were answered. The patient agreed with the plan and demonstrated an understanding of the instructions.   The patient was advised to call back or seek an in-person evaluation if the symptoms worsen or if the condition fails to improve as anticipated.  I provided 45 minutes of non-face-to-face time during this encounter.    Bynum Bellows, LCSW 10/20/2019

## 2019-11-01 ENCOUNTER — Ambulatory Visit (INDEPENDENT_AMBULATORY_CARE_PROVIDER_SITE_OTHER): Payer: 59 | Admitting: Licensed Clinical Social Worker

## 2019-11-01 DIAGNOSIS — F331 Major depressive disorder, recurrent, moderate: Secondary | ICD-10-CM | POA: Diagnosis not present

## 2019-11-02 ENCOUNTER — Other Ambulatory Visit: Payer: Self-pay

## 2019-11-02 ENCOUNTER — Ambulatory Visit (INDEPENDENT_AMBULATORY_CARE_PROVIDER_SITE_OTHER): Payer: 59 | Admitting: Psychiatry

## 2019-11-02 DIAGNOSIS — F330 Major depressive disorder, recurrent, mild: Secondary | ICD-10-CM

## 2019-11-02 DIAGNOSIS — F900 Attention-deficit hyperactivity disorder, predominantly inattentive type: Secondary | ICD-10-CM | POA: Diagnosis not present

## 2019-11-02 NOTE — Progress Notes (Signed)
[facility] MD/PA/NP OP Progress Note  11/02/2019 11:12 AM Shane [name]  MRN:  [id number] Interview was conducted by phone and I verified that I was speaking with the correct person using two identifiers. I discussed the limitations of evaluation and management by telemedicine and  the availability of in person appointments. Patient expressed understanding and agreed to proceed.  Chief Complaint: I am doing much better.  HPI: 49 yo married male with a hx of depression/anxiety in a setting of marital conflicts. He has become more depressed, anxious, worried, not sleeping well and losing weight (no appetite) after his wife of 21 years Shane [name] decided to split and left their home on August 3rd. He has not spoken with her for two weeks. This is the third time she did that with a first time in 2013 when he was excessively drinking alcohol.Of note is that patient's wife is dx and treated for bipolar disorder. Shane [name] and achieved sobriety in March 2014. During that time he became depressed and was started on Wellbutrin 150 mg bid. It helped with mood some but caused decline in libido so he eventually stopped taking it. He has now been on sertraline for about a year - dose was increased to 100 mg and mirtazapine added. His mood has improved and so is sleep and appetite. He was in [facility] ED on 07/03/19 - brought by his boss because of text messages which could be interpreted as expressing SI. He denies having SI then and now - feels that he was asking for help this way. He has no prior hx of SI/attempts, no hx of inpatient psychiatric admissions. He denies having hx of mania or psychosis.  Shane [name] has a hx of problems with concentration, task completion, procrastination since middle school. He was formally diagnosed with ADD in 2014 and started on Adderall. He has no hx of illicit or prescribed drug abuse.He will continue to work on his approach to marital relationship with Shane [name].  Visit Diagnosis:     ICD-10-CM   1. Major depressive disorder, recurrent episode, mild (HCC)  F33.0   2. Attention deficit hyperactivity disorder (ADHD), predominantly inattentive type  F90.0     Past Psychiatric History: Please see intake H&P.  Past Medical History:  Past Medical History:  Diagnosis Date  . ADD (attention deficit disorder)   . Alcohol abuse    History of.  . Anxiety   . Depression   . Hyperlipidemia   . Hypertension     Past Surgical History:  Procedure Laterality Date  . TENDON REPAIR Right 09/15/2015   Procedure: RIGHT FOREARM EXPLORATION AND CLOSURE OF LACERATION, TENDON REPAIR;  Surgeon: Iran Planas, MD;  Location: [facility];  Service: Orthopedics;  Laterality: Right;    Family Psychiatric History: Reviewed.  Family History:  Family History  Problem Relation Age of Onset  . Prostate cancer Father   . Hyperlipidemia Father   . Hypertension Father   . Lung cancer Maternal Grandfather   . Heart attack Maternal Grandfather 12       died  . Alcohol abuse Paternal Grandfather   . Hypertension Cousin   . Hyperlipidemia Other   . Heart attack Paternal Uncle 6       died  . Heart attack Maternal Great-grandfather 71  . Alcohol abuse Maternal Aunt     Social History:  Social History   Socioeconomic History  . Marital status: Married    Spouse name: Shane [name]  . Number of children: 2  .  Years of education: 77  . Highest education level: Not on file  Occupational History    Comment: [employer]  Tobacco Use  . Smoking status: Current Every Day Smoker    Packs/day: 0.25  . Smokeless tobacco: Never Used  Substance and Sexual Activity  . Alcohol use: No    Comment: no alcohol since 12/16/2012  . Drug use: No  . Sexual activity: Yes    Partners: Female  Other Topics Concern  . Not on file  Social History Narrative   Lives with his wife and their two daughters (21 and 22 as of 10/07/17)   Rebuild induction coils for work   Former smoker x 5-6 years 2  packs per week quit   Social Determinants of Corporate investment banker Strain:   . Difficulty of Paying Living Expenses: Not on file  Food Insecurity:   . Worried About Programme researcher, broadcasting/film/video in the Last Year: Not on file  . Ran Out of Food in the Last Year: Not on file  Transportation Needs:   . Lack of Transportation (Medical): Not on file  . Lack of Transportation (Non-Medical): Not on file  Physical Activity:   . Days of Exercise per Week: Not on file  . Minutes of Exercise per Session: Not on file  Stress:   . Feeling of Stress : Not on file  Social Connections:   . Frequency of Communication with Friends and Family: Not on file  . Frequency of Social Gatherings with Friends and Family: Not on file  . Attends Religious Services: Not on file  . Active Member of Clubs or Organizations: Not on file  . Attends Banker Meetings: Not on file  . Marital Status: Not on file    Allergies: No Known Allergies  Metabolic Disorder Labs: No results found for: HGBA1C, MPG No results found for: PROLACTIN Lab Results  Component Value Date   CHOL 220 (H) 12/05/2017   TRIG 100.0 12/05/2017   HDL 50.00 12/05/2017   CHOLHDL 4 12/05/2017   VLDL 20.0 12/05/2017   LDLCALC 150 (H) 12/05/2017   LDLCALC 142 (H) 05/29/2016   Lab Results  Component Value Date   TSH 2.15 12/05/2017   TSH 1.995 12/17/2012    Therapeutic Level Labs: No results found for: LITHIUM No results found for: VALPROATE No components found for:  CBMZ  Current Medications: Current Outpatient Medications  Medication Sig Dispense Refill  . amLODipine (NORVASC) 2.5 MG tablet Take 1 tablet (2.5 mg total) by mouth daily. 90 tablet 2  . amphetamine-dextroamphetamine (ADDERALL) 20 MG tablet Take 1 tablet (20 mg total) by mouth 2 (two) times daily. 60 tablet 0  . amphetamine-dextroamphetamine (ADDERALL) 20 MG tablet Take 1 tablet (20 mg total) by mouth 2 (two) times daily. 60 tablet 0  . [START ON 11/24/2019]  amphetamine-dextroamphetamine (ADDERALL) 20 MG tablet Take 1 tablet (20 mg total) by mouth 2 (two) times daily. 60 tablet 0  . hydrocortisone 2.5 % cream Apply topically 2 (two) times daily. Left ear 30 g 2  . mirtazapine (REMERON) 15 MG tablet Take 1 tablet (15 mg total) by mouth at bedtime. 90 tablet 0  . mupirocin ointment (BACTROBAN) 2 % Apply 1 application topically 2 (two) times daily. 30 g 2  . sertraline (ZOLOFT) 100 MG tablet Take 1 tablet (100 mg total) by mouth daily. 90 tablet 0   No current facility-administered medications for this visit.     Psychiatric Specialty Exam: Review  of Systems  All other systems reviewed and are negative.   There were no vitals taken for this visit.There is no height or weight on file to calculate BMI.  General Appearance: NA  Eye Contact:  NA  Speech:  Clear and Coherent and Normal Rate  Volume:  Normal  Mood:  Mild anxiety.  Affect:  NA  Thought Process:  Goal Directed and Linear  Orientation:  Full (Time, Place, and Person)  Thought Content: Logical   Suicidal Thoughts:  No  Homicidal Thoughts:  No  Memory:  Immediate;   Good Recent;   Good Remote;   Good  Judgement:  Good  Insight:  Good  Psychomotor Activity:  NA  Concentration:  Concentration: Good  Recall:  Good  Fund of Knowledge: Good  Language: Good  Akathisia:  Negative  Handed:  Right  AIMS (if indicated): not done  Assets:  Communication Skills Desire for Improvement Financial Resources/Insurance Housing Physical Health Talents/Skills  ADL's:  Intact  Cognition: WNL  Sleep:  Good   Screenings: PHQ2-9     Office Visit from 03/02/2019 in [facility] [place] Office Visit from 12/01/2018 in [facility] [place] Office Visit from 12/05/2017 in [facility] [place] Office Visit from 05/29/2016 in Primary Care at [facility] Visit from 02/24/2016 in Primary Care at [place]  PHQ-2 Total Score  0  0  0  0  0  PHQ-9 Total Score  1  1  --  --   --       Assessment and Plan: 49 yo married male with a hx of depression/anxiety in a setting of marital conflicts. He has become more depressed, anxious, worried, not sleeping well and losing weight (no appetite) after his wife of 21 years Shane [name] decided to split and left their home on August 3rd. He has not spoken with her for two weeks. This is the third time she did that with a first time in 2013 when he was excessively drinking alcohol.Of note is that patient's wife is dx and treated for bipolar disorder. Shane [name] went to AA and achieved sobriety in March 2014. During that time he became depressed and was started on Wellbutrin 150 mg bid. It helped with mood some but caused decline in libido so he eventually stopped taking it. He has now been on sertraline for about a year - dose was increased to 100 mg and mirtazapine added. His mood has improved and so is sleep and appetite. He was in [facility] ED on 07/03/19 - brought by his boss because of text messages which could be interpreted as expressing SI. He denies having SI then and now - feels that he was asking for help this way. He has no prior hx of SI/attempts, no hx of inpatient psychiatric admissions. He denies having hx of mania or psychosis.  Shane [name] has a hx of problems with concentration, task completion, procrastination since middle school. He was formally diagnosed with ADD in 2014 and started on Adderall. He has no hx of illicit or prescribed drug abuse.He will continue to work on his approach to marital relationship with Shane [name].  Dx: Major depressive disorder recurrent mild; Adult ADD; Alcohol use disorder moderate in sustained remission  Plan:Continuesertraline to 100 mg daily,mirtazapine 15 mg at HS for sleep/appetite/moodandAdderall20 mg bid.Next appointment with me in 8 weeks. The plan was discussed with patient who had an opportunity to ask questions and these were all answered. I spend22minutes inphone consultationwith the  patient.   Shane [name]  A Lauris Keepers, MD 11/02/2019, 11:12 AM

## 2019-11-03 NOTE — Progress Notes (Signed)
Virtual Visit via Video Note  I connected with Shane [name] on 11/03/19 at  4:00 PM EST by a video enabled telemedicine application and verified that I am speaking with the correct person using two identifiers.  Location: Patient: Home Provider: Office   I discussed the limitations of evaluation and management by telemedicine and the availability of in person appointments. The patient expressed understanding and agreed to proceed.   THERAPIST PROGRESS NOTE  Session Time: 4:00 pm-4:45 pm  Participation Level: Active  Behavioral Response: CasualAlertDepressed  Type of Therapy: Individual Therapy  Treatment Goals addressed: Coping  Interventions: CBT and Solution Focused  Summary: Shane [name] is a 49 y.o. male who presents oriented x5 (person, place, situation, time, and object), casually dressed, appropriately groomed, average height, average weight, and cooperative to address mood. Patient has a history of medical treatment including hypertension and asthma. Patient has minimal history of mental health treatment. Patient denies suicidal and homicidal ideations. He does admit to somewhat passive thoughts of SI that got him to the hospital for an evaluation. Patient denies psychosis including auditory and visual hallucinations. Patient denies substance abuse. He is at low risk for lethality at this time.   Physically: Patient continues to have difficulty with sleep. He has gotten out and got some physical activity by playing disc golf.   Spiritually/values: Patient is working on being spiritually healthy. He continues to pray. He has strong views and values related to marriage including views on divorce. He does not want a divorce from his wife.  Relationships: Patient is having improved but short conversations. Patient noted that he asked his wife what healing means to her and she told him that he needs to stop living in the past, not beat himself up for mistakes, and move  forward. He was hoping to hear her view of how they heal together but he was grateful for the interaction/discussion. He is trying to avoid "forcing" things to work or happen. .  Emotionally/Mentally/Behavior: Patient's mood was down but better than before. Patient has been trying to limit his interactions but also change how he interacts with his wife. He feels like she is not clear what she wants which makes it hard for him to more forward.   Patient engaged in session. He responded well to interventions. Patient continues to meet criteria for Major depressive disorder, recurrent episode, moderate and ADHD. Patient will continue in outpatient therapy due to being the least restrictive service to meet his needs. Patient made minimal progress on his goals.   Suicidal/Homicidal: Nowithout intent/plan  Therapist Response: Therapist reviewed patient's recent thoughts and behaviors. Therapist utilized CBT to address mood. Therapist processed patient's feelings to identify triggers for mood. Therapist discussed his relationship with his wife, and his personal values.  Plan: Return again in 1 weeks.  Diagnosis: Axis I: Major depressive disorder, recurrent episode, moderate    Axis II: No diagnosis  I discussed the assessment and treatment plan with the patient. The patient was provided an opportunity to ask questions and all were answered. The patient agreed with the plan and demonstrated an understanding of the instructions.   The patient was advised to call back or seek an in-person evaluation if the symptoms worsen or if the condition fails to improve as anticipated.  I provided 45 minutes of non-face-to-face time during this encounter.    Shane Bellows, LCSW 11/03/2019

## 2019-11-16 ENCOUNTER — Other Ambulatory Visit: Payer: Self-pay

## 2019-11-16 ENCOUNTER — Ambulatory Visit (INDEPENDENT_AMBULATORY_CARE_PROVIDER_SITE_OTHER): Payer: 59 | Admitting: Licensed Clinical Social Worker

## 2019-11-16 DIAGNOSIS — F331 Major depressive disorder, recurrent, moderate: Secondary | ICD-10-CM

## 2019-11-17 NOTE — Progress Notes (Signed)
Virtual Visit via Video Note  I connected with Shane [name] on 11/17/19 at 11:00 AM EST by a video enabled telemedicine application and verified that I am speaking with the correct person using two identifiers.  Location: Patient: Home Provider: Office   I discussed the limitations of evaluation and management by telemedicine and the availability of in person appointments. The patient expressed understanding and agreed to proceed.   THERAPIST PROGRESS NOTE  Session Time: 11:00 am-11:45 am  Participation Level: Active  Behavioral Response: CasualAlertDepressed  Type of Therapy: Individual Therapy  Treatment Goals addressed: Coping  Interventions: CBT and Solution Focused  Summary: Shane [name] is a 49 y.o. male who presents oriented x5 (person, place, situation, time, and object), casually dressed, appropriately groomed, average height, average weight, and cooperative to address mood. Patient has a history of medical treatment including hypertension and asthma. Patient has minimal history of mental health treatment. Patient denies suicidal and homicidal ideations. He does admit to somewhat passive thoughts of SI that got him to the hospital for an evaluation. Patient denies psychosis including auditory and visual hallucinations. Patient denies substance abuse. He is at low risk for lethality at this time.   Physically: Patient is trying to maintain some level of activity.   Spiritually/values: Patient's values related to marriage make it difficult to accept that his wife may want a divorce.  Relationships: Patient's relationships with his children are getting closer. He feels like he is taking steps to be a better father. Patient's interaction and communication with his wife is still the same. Patient feels like she doesn't understand him. He feels like his feelings are not being acknowledged by his wife, her mother, etc.  Emotionally/Mentally/Behavior: Patient's mood was  stable. His relationship with his wife is the same which is upsetting to him. He wants to be able to talk and know where they stand. He also feels like if she wants a divorce it will be hard to be "just friends" with her as she says she wants to be. She says things to him such as "we will always be family" but patient doesn't understand how he could be family if they get a divorce.   Patient engaged in session. He responded well to interventions. Patient continues to meet criteria for Major depressive disorder, recurrent episode, moderate and ADHD. Patient will continue in outpatient therapy due to being the least restrictive service to meet his needs. Patient made minimal progress on his goals.   Suicidal/Homicidal: Nowithout intent/plan  Therapist Response: Therapist reviewed patient's recent thoughts and behaviors. Therapist utilized CBT to address mood. Therapist processed patient's feelings to identify triggers for mood. Therapist discussed his relationship with his wife and his relationship with his children.   Plan: Return again in 2 weeks.  Diagnosis: Axis I: Major depressive disorder, recurrent episode, moderate    Axis II: No diagnosis  I discussed the assessment and treatment plan with the patient. The patient was provided an opportunity to ask questions and all were answered. The patient agreed with the plan and demonstrated an understanding of the instructions.   The patient was advised to call back or seek an in-person evaluation if the symptoms worsen or if the condition fails to improve as anticipated.  I provided 45 minutes of non-face-to-face time during this encounter.    Bynum Bellows, LCSW 11/17/2019

## 2019-12-21 ENCOUNTER — Ambulatory Visit (INDEPENDENT_AMBULATORY_CARE_PROVIDER_SITE_OTHER): Payer: 59 | Admitting: Psychiatry

## 2019-12-21 ENCOUNTER — Other Ambulatory Visit: Payer: Self-pay

## 2019-12-21 DIAGNOSIS — F3341 Major depressive disorder, recurrent, in partial remission: Secondary | ICD-10-CM

## 2019-12-21 DIAGNOSIS — F900 Attention-deficit hyperactivity disorder, predominantly inattentive type: Secondary | ICD-10-CM

## 2019-12-21 DIAGNOSIS — F330 Major depressive disorder, recurrent, mild: Secondary | ICD-10-CM

## 2019-12-21 MED ORDER — SERTRALINE HCL 100 MG PO TABS: 100.0000 mg | ORAL_TABLET | Freq: Every day | ORAL | 1 refills | Status: DC

## 2019-12-21 MED ORDER — AMPHETAMINE-DEXTROAMPHETAMINE 20 MG PO TABS: 20.0000 mg | ORAL_TABLET | Freq: Two times a day (BID) | ORAL | 0 refills | Status: DC

## 2019-12-21 MED ORDER — MIRTAZAPINE 15 MG PO TABS: 15.0000 mg | ORAL_TABLET | Freq: Every day | ORAL | 1 refills | Status: DC

## 2019-12-21 NOTE — Progress Notes (Signed)
[place] MD/PA/NP OP Progress Note  12/21/2019 11:15 AM Shane [name]  MRN:  [id number] Interview was conducted by phone and I verified that I was speaking with the correct person using two identifiers. I discussed the limitations of evaluation and management by telemedicine and  the availability of in person appointments. Patient expressed understanding and agreed to proceed.  Chief Complaint: "I am doing well".  HPI: 49 yo married male with a hx of depression/anxiety in a setting of marital conflicts. He has become more depressed, anxious, worried, not sleeping well and losing weight (no appetite) after his wife of 21 years Shane [name] decided to split and left their home on August 3rd.He has not spoken with her for two weeks.This is the third time she did that with a first time in 2013 when he was excessively drinking alcohol.Of note is that patient's wife is dx and treated for bipolar disorder. Shane [name] went to [facility] and achieved sobriety in March 2014. During that time he became depressed and was started on Wellbutrin 150 mg bid. It helped with mood some but caused decline in libido so he eventually stopped taking it. He has now been on sertraline for about a year- dose was increased to 100 mg and mirtazapine added. His mood has improved and so is sleep and appetite. He was in [facility] ED on 07/03/19 - brought by his boss because of text messages which could be interpreted as expressing SI. He denies having SI then and now - feels that he was asking for help this way. He has no prior hx of SI/attempts, no hx of inpatient psychiatric admissions. He denies having hx of mania or psychosis. Shane [name] has a hx of problems with concentration, task completion, procrastination since middle school. He was formally diagnosed with ADD in 2014 and started on Adderall. He has no hx of illicit or prescribed drug abuse.He has worked on his approach to marital relationship issues with [employer].   Visit Diagnosis:     ICD-10-CM   1. Attention deficit hyperactivity disorder (ADHD), predominantly inattentive type  F90.0   2. Mild episode of recurrent major depressive disorder (HCC)  F33.0 sertraline (ZOLOFT) 100 MG tablet  3. Major depressive disorder, recurrent episode, in partial remission (HCC)  F33.41     Past Psychiatric History: Please see intake H&P.  Past Medical History:  Past Medical History:  Diagnosis Date  . ADD (attention deficit disorder)   . Alcohol abuse    History of.  . Anxiety   . Depression   . Hyperlipidemia   . Hypertension     Past Surgical History:  Procedure Laterality Date  . TENDON REPAIR Right 09/15/2015   Procedure: RIGHT FOREARM EXPLORATION AND CLOSURE OF LACERATION, TENDON REPAIR;  Surgeon: Iran Planas, MD;  Location: [place];  Service: Orthopedics;  Laterality: Right;    Family Psychiatric History: Reviewed.  Family History:  Family History  Problem Relation Age of Onset  . Prostate cancer Father   . Hyperlipidemia Father   . Hypertension Father   . Lung cancer Maternal Grandfather   . Heart attack Maternal Grandfather 42       died  . Alcohol abuse Paternal Grandfather   . Hypertension Cousin   . Hyperlipidemia Other   . Heart attack Paternal Uncle 51       died  . Heart attack Maternal Great-grandfather 71  . Alcohol abuse Maternal Aunt     Social History:  Social History   Socioeconomic History  . Marital status:  Married    Spouse name: Shane [name]  . Number of children: 2  . Years of education: 47  . Highest education level: Not on file  Occupational History    Comment: [employer]  Tobacco Use  . Smoking status: Current Every Day Smoker    Packs/day: 0.25  . Smokeless tobacco: Never Used  Substance and Sexual Activity  . Alcohol use: No    Comment: no alcohol since 12/16/2012  . Drug use: No  . Sexual activity: Yes    Partners: Female  Other Topics Concern  . Not on file  Social History Narrative   Lives with his  wife and their two daughters (23 and 58 as of 10/07/17)   Rebuild induction coils for work   Former smoker x 5-6 years 2 packs per week quit   Social Determinants of Corporate investment banker Strain:   . Difficulty of Paying Living Expenses:   Food Insecurity:   . Worried About Programme researcher, broadcasting/film/video in the Last Year:   . Barista in the Last Year:   Transportation Needs:   . Freight forwarder (Medical):   Marland Kitchen Lack of Transportation (Non-Medical):   Physical Activity:   . Days of Exercise per Week:   . Minutes of Exercise per Session:   Stress:   . Feeling of Stress :   Social Connections:   . Frequency of Communication with Friends and Family:   . Frequency of Social Gatherings with Friends and Family:   . Attends Religious Services:   . Active Member of Clubs or Organizations:   . Attends Banker Meetings:   Marland Kitchen Marital Status:     Allergies: No Known Allergies  Metabolic Disorder Labs: No results found for: HGBA1C, MPG No results found for: PROLACTIN Lab Results  Component Value Date   CHOL 220 (H) 12/05/2017   TRIG 100.0 12/05/2017   HDL 50.00 12/05/2017   CHOLHDL 4 12/05/2017   VLDL 20.0 12/05/2017   LDLCALC 150 (H) 12/05/2017   LDLCALC 142 (H) 05/29/2016   Lab Results  Component Value Date   TSH 2.15 12/05/2017   TSH 1.995 12/17/2012    Therapeutic Level Labs: No results found for: LITHIUM No results found for: VALPROATE No components found for:  CBMZ  Current Medications: Current Outpatient Medications  Medication Sig Dispense Refill  . amLODipine (NORVASC) 2.5 MG tablet Take 1 tablet (2.5 mg total) by mouth daily. 90 tablet 2  . [START ON 12/24/2019] amphetamine-dextroamphetamine (ADDERALL) 20 MG tablet Take 1 tablet (20 mg total) by mouth 2 (two) times daily. 60 tablet 0  . [START ON 01/24/2020] amphetamine-dextroamphetamine (ADDERALL) 20 MG tablet Take 1 tablet (20 mg total) by mouth 2 (two) times daily. 60 tablet 0  . [START ON  02/23/2020] amphetamine-dextroamphetamine (ADDERALL) 20 MG tablet Take 1 tablet (20 mg total) by mouth 2 (two) times daily. 60 tablet 0  . hydrocortisone 2.5 % cream Apply topically 2 (two) times daily. Left ear 30 g 2  . mirtazapine (REMERON) 15 MG tablet Take 1 tablet (15 mg total) by mouth at bedtime. 90 tablet 1  . mupirocin ointment (BACTROBAN) 2 % Apply 1 application topically 2 (two) times daily. 30 g 2  . sertraline (ZOLOFT) 100 MG tablet Take 1 tablet (100 mg total) by mouth daily. 90 tablet 1   No current facility-administered medications for this visit.     Psychiatric Specialty Exam: Review of Systems  All other systems  reviewed and are negative.   There were no vitals taken for this visit.There is no height or weight on file to calculate BMI.  General Appearance: NA  Eye Contact:  NA  Speech:  Clear and Coherent and Normal Rate  Volume:  Normal  Mood:  Euthymic  Affect:  NA  Thought Process:  Goal Directed and Linear  Orientation:  Full (Time, Place, and Person)  Thought Content: Logical   Suicidal Thoughts:  No  Homicidal Thoughts:  No  Memory:  Immediate;   Good Recent;   Good Remote;   Good  Judgement:  Good  Insight:  Good  Psychomotor Activity:  NA  Concentration:  Concentration: Good  Recall:  Good  Fund of Knowledge: Good  Language: Good  Akathisia:  Negative  Handed:  Right  AIMS (if indicated): not done  Assets:  Communication Skills Desire for Improvement Financial Resources/Insurance Housing Physical Health Talents/Skills  ADL's:  Intact  Cognition: WNL  Sleep:  Good   Screenings: PHQ2-9     Office Visit from 03/02/2019 in [facility] Office Visit from 12/01/2018 in [facility] Office Visit from 12/05/2017 in [facility] [place] Office Visit from 05/29/2016 in Primary Care at [facility] Visit from 02/24/2016 in Primary Care at [place]  PHQ-2 Total Score  0  0  0  0  0  PHQ-9 Total Score  1  1   --  --  --       Assessment and Plan: 49 yo married male with a hx of depression/anxiety in a setting of marital conflicts. He has become more depressed, anxious, worried, not sleeping well and losing weight (no appetite) after his wife of 21 years Lyla Son decided to split and left their home on August 3rd.He has not spoken with her for two weeks.This is the third time she did that with a first time in 2013 when he was excessively drinking alcohol.Of note is that patient's wife is dx and treated for bipolar disorder. Vincenza Hews went [facility] and achieved sobriety in March 2014. During that time he became depressed and was started on Wellbutrin 150 mg bid. It helped with mood some but caused decline in libido so he eventually stopped taking it. He has now been on sertraline for about a year- dose was increased to 100 mg and mirtazapine added. His mood has improved and so is sleep and appetite. He was in [facility] ED on 07/03/19 - brought by his boss because of text messages which could be interpreted as expressing SI. He denies having SI then and now - feels that he was asking for help this way. He has no prior hx of SI/attempts, no hx of inpatient psychiatric admissions. He denies having hx of mania or psychosis. Vincenza Hews has a hx of problems with concentration, task completion, procrastination since middle school. He was formally diagnosed with ADD in 2014 and started on Adderall. He has no hx of illicit or prescribed drug abuse.He has worked on his approach to marital relationship issues with [employer].  Dx: Major depressive disorder recurrent in partial remission; Adult ADD; Alcohol use disorder moderate in sustained remission  Plan:Continuesertraline to 100 mg daily,mirtazapine 15 mg at HS for sleep/appetite/moodandAdderall20 mg bid.Next appointment with me in 3 months. The plan was discussed with patient who had an opportunity to ask questions and these were all answered. I spend92minutes inphone  consultationwith the patient.    Magdalene Patricia, MD 12/21/2019, 11:15 AM

## 2020-01-28 ENCOUNTER — Encounter: Payer: Self-pay | Admitting: Family

## 2020-03-05 ENCOUNTER — Encounter: Payer: Self-pay | Admitting: Family

## 2020-03-05 ENCOUNTER — Ambulatory Visit (INDEPENDENT_AMBULATORY_CARE_PROVIDER_SITE_OTHER): Payer: 59 | Admitting: Family

## 2020-03-05 ENCOUNTER — Other Ambulatory Visit: Payer: Self-pay

## 2020-03-05 VITALS — BP 124/82 | HR 105 | Temp 97.5°F | Ht 73.75 in | Wt 209.6 lb

## 2020-03-05 DIAGNOSIS — S01309A Unspecified open wound of unspecified ear, initial encounter: Secondary | ICD-10-CM | POA: Insufficient documentation

## 2020-03-05 DIAGNOSIS — S01302D Unspecified open wound of left ear, subsequent encounter: Secondary | ICD-10-CM

## 2020-03-05 DIAGNOSIS — I10 Essential (primary) hypertension: Secondary | ICD-10-CM

## 2020-03-05 DIAGNOSIS — Z23 Encounter for immunization: Secondary | ICD-10-CM | POA: Diagnosis not present

## 2020-03-05 DIAGNOSIS — Z720 Tobacco use: Secondary | ICD-10-CM

## 2020-03-05 DIAGNOSIS — Z0000 Encounter for general adult medical examination without abnormal findings: Secondary | ICD-10-CM | POA: Diagnosis not present

## 2020-03-05 DIAGNOSIS — F3341 Major depressive disorder, recurrent, in partial remission: Secondary | ICD-10-CM | POA: Diagnosis not present

## 2020-03-05 LAB — LIPID PANEL
Cholesterol: 242 mg/dL — ABNORMAL HIGH (ref 0–200)
HDL: 50.1 mg/dL (ref >39.00)
LDL Cholesterol: 164 mg/dL — ABNORMAL HIGH (ref 0–99)
NonHDL: 191.61
Total CHOL/HDL Ratio: 5
Triglycerides: 136 mg/dL (ref 0.0–149.0)
VLDL: 27.2 mg/dL (ref 0.0–40.0)

## 2020-03-05 LAB — COMPREHENSIVE METABOLIC PANEL
ALT: 48 U/L (ref 0–53)
AST: 31 U/L (ref 0–37)
Albumin: 4.4 g/dL (ref 3.5–5.2)
Alkaline Phosphatase: 115 U/L (ref 39–117)
BUN: 12 mg/dL (ref 6–23)
CO2: 30 mEq/L (ref 19–32)
Calcium: 9.8 mg/dL (ref 8.4–10.5)
Chloride: 99 mEq/L (ref 96–112)
Creatinine, Ser: 0.94 mg/dL (ref 0.40–1.50)
GFR: 85.96 mL/min (ref >60.00)
Glucose, Bld: 107 mg/dL — ABNORMAL HIGH (ref 70–99)
Potassium: 4.5 mEq/L (ref 3.5–5.1)
Sodium: 135 mEq/L (ref 135–145)
Total Bilirubin: 0.9 mg/dL (ref 0.2–1.2)
Total Protein: 7.5 g/dL (ref 6.0–8.3)

## 2020-03-05 LAB — CBC WITH DIFFERENTIAL/PLATELET
Basophils Absolute: 0 10*3/uL (ref 0.0–0.1)
Basophils Relative: 0.3 % (ref 0.0–3.0)
Eosinophils Absolute: 0.1 10*3/uL (ref 0.0–0.7)
Eosinophils Relative: 1.5 % (ref 0.0–5.0)
HCT: 50.3 % (ref 39.0–52.0)
Hemoglobin: 17.5 g/dL — ABNORMAL HIGH (ref 13.0–17.0)
Lymphocytes Relative: 14.8 % (ref 12.0–46.0)
Lymphs Abs: 1.3 10*3/uL (ref 0.7–4.0)
MCHC: 34.7 g/dL (ref 30.0–36.0)
MCV: 97.5 fl (ref 78.0–100.0)
Monocytes Absolute: 0.5 10*3/uL (ref 0.1–1.0)
Monocytes Relative: 6 % (ref 3.0–12.0)
Neutro Abs: 6.6 10*3/uL (ref 1.4–7.7)
Neutrophils Relative %: 77.4 % — ABNORMAL HIGH (ref 43.0–77.0)
Platelets: 265 10*3/uL (ref 150.0–400.0)
RBC: 5.16 Mil/uL (ref 4.22–5.81)
RDW: 13.8 % (ref 11.5–15.5)
WBC: 8.5 10*3/uL (ref 4.0–10.5)

## 2020-03-05 LAB — VITAMIN D 25 HYDROXY (VIT D DEFICIENCY, FRACTURES): VITD: 28.45 ng/mL — ABNORMAL LOW (ref 30.00–100.00)

## 2020-03-05 LAB — HEMOGLOBIN A1C: Hgb A1c MFr Bld: 5.9 % (ref 4.6–6.5)

## 2020-03-05 LAB — TSH: TSH: 2.44 u[IU]/mL (ref 0.35–4.50)

## 2020-03-05 LAB — PSA: PSA: 1.51 ng/mL (ref 0.10–4.00)

## 2020-03-05 MED ORDER — NICOTINE 14 MG/24HR TD PT24: 14.0000 mg | MEDICATED_PATCH | Freq: Every day | TRANSDERMAL | 5 refills | Status: DC

## 2020-03-05 MED ORDER — DOXYCYCLINE HYCLATE 100 MG PO TABS: 100.0000 mg | ORAL_TABLET | Freq: Two times a day (BID) | ORAL | 0 refills | Status: DC

## 2020-03-05 NOTE — Progress Notes (Signed)
Subjective:    Patient ID: Shane [name], male    DOB: Nov 11, 1970, 49 y.o.   MRN: [id number]  CC: Shane [name] is a 49 y.o. male who presents today for physical exam.    HPI: Continues to struggle with depression.  Recent separation from wife of 21 years.  Tearful when discussing this. Trouble sleeping however improved on medication regimen.  Has gained weight back. Appetite improved.    No si/hi.  Never has had suicide plan . Never had suicide attempt  Lives for daughters who more recently has been able to seen them more. He has closer relationship with them now.   Had been sobers since march 2014 however notes may have an occasional drink but not to excess.   HTN-compliant amlodipine.  No chest pain  Depression-Follows with Dr Hinton Dyer for ADHD , depression; upcoming appointment this month.  On zoloft, mirtazapine. Had following with counselor which was helpful however felt that he gotten all the help he needed. No si/hi  Complains of wound on left auricle with  purulent drainage that comes and goes over the past couple of years.  At times, it appears that spontaneously will develop painful wound which drains. This has been chronic, intermittent since injury.  No hearing loss, discharge from ear canal, fever, N, v, vision changes, ha.  Had injury of being hit by dust grinder at work in 2018. No ho mrsa.      ED 06/2019 for depression  Colorectal  Cancer Screening: due Prostate Cancer Screening:No trouble urinating.  No urinary hesitancy Lung Cancer Screening: No 30 year pack year history and > 55 years.  Immunizations       Tetanus - UTD. Works in rust, and concerned that Tdap was earlier than 2013 than chart reports. Would like booster today.         HIV Screening- Candidate for  Labs: Screening labs today. Exercise: Gets regular exercise.  Alcohol use: occassional Smoking/tobacco use: smoker. Has cut back , and recently decreased to 3 /day from a 21/day.      HISTORY:  Past Medical History:  Diagnosis Date  . ADD (attention deficit disorder)   . Alcohol abuse    History of.  . Anxiety   . Depression   . Hyperlipidemia   . Hypertension     Past Surgical History:  Procedure Laterality Date  . TENDON REPAIR Right 09/15/2015   Procedure: RIGHT FOREARM EXPLORATION AND CLOSURE OF LACERATION, TENDON REPAIR;  Surgeon: Bradly Bienenstock, MD;  Location: [facility];  Service: Orthopedics;  Laterality: Right;   Family History  Problem Relation Age of Onset  . Prostate cancer Father   . Hyperlipidemia Father   . Hypertension Father   . Lung cancer Maternal Grandfather   . Heart attack Maternal Grandfather 66       died  . Alcohol abuse Paternal Grandfather   . Hypertension Cousin   . Hyperlipidemia Other   . Heart attack Paternal Uncle 52       died  . Heart attack Maternal Great-grandfather 71  . Alcohol abuse Maternal Aunt       ALLERGIES: Patient has no known allergies.  Current Outpatient Medications on File Prior to Visit  Medication Sig Dispense Refill  . amphetamine-dextroamphetamine (ADDERALL) 20 MG tablet Take 1 tablet (20 mg total) by mouth 2 (two) times daily. 60 tablet 0  . mirtazapine (REMERON) 15 MG tablet Take 1 tablet (15 mg total) by mouth at bedtime. 90 tablet 1  .  sertraline (ZOLOFT) 100 MG tablet Take 1 tablet (100 mg total) by mouth daily. 90 tablet 1  . amLODipine (NORVASC) 2.5 MG tablet Take 1 tablet (2.5 mg total) by mouth daily. (Patient not taking: Reported on 03/05/2020) 90 tablet 2  . amphetamine-dextroamphetamine (ADDERALL) 20 MG tablet Take 1 tablet (20 mg total) by mouth 2 (two) times daily. 60 tablet 0  . amphetamine-dextroamphetamine (ADDERALL) 20 MG tablet Take 1 tablet (20 mg total) by mouth 2 (two) times daily. 60 tablet 0   No current facility-administered medications on file prior to visit.    Social History   Tobacco Use  . Smoking status: Current Every Day Smoker    Packs/day: 0.25  . Smokeless  tobacco: Never Used  Substance Use Topics  . Alcohol use: No    Comment: no alcohol since 12/16/2012  . Drug use: No    Review of Systems  Constitutional: Negative for chills and fever.  HENT: Positive for ear pain (left). Negative for congestion, hearing loss, postnasal drip, sinus pressure, sinus pain and sore throat.   Respiratory: Negative for cough.   Cardiovascular: Negative for chest pain, palpitations and leg swelling.  Gastrointestinal: Negative for diarrhea, nausea and vomiting.  Musculoskeletal: Negative for myalgias.  Skin: Positive for wound (left auricle). Negative for rash.  Neurological: Negative for headaches.  Hematological: Negative for adenopathy.  Psychiatric/Behavioral: Positive for sleep disturbance. Negative for confusion and suicidal ideas. The patient is not nervous/anxious.       Objective:    BP 124/82   Pulse (!) 105   Temp (!) 97.5 F (36.4 C) (Temporal)   Ht 6' 1.75" (1.873 m)   Wt 209 lb 9.6 oz (95.1 kg)   SpO2 99%   BMI 27.09 kg/m   BP Readings from Last 3 Encounters:  03/05/20 124/82  07/03/19 128/64  03/05/19 (!) 147/90   Wt Readings from Last 3 Encounters:  03/05/20 209 lb 9.6 oz (95.1 kg)  07/03/19 190 lb (86.2 kg)  03/05/19 196 lb (88.9 kg)    Physical Exam Vitals reviewed.  Constitutional:      Appearance: He is well-developed.  HENT:     Head:      Comments: Open sore auricle left ear as noted on diagram.  Spontaneous purulent discharge noted from wound.  No red streaks.  Auricle itself is nonedematous. Mildly tender. Scarring from injury noted left nape of neck as marked on diagram. No lymphadenopathy appreciated Neck:     Thyroid: No thyroid mass or thyromegaly.  Cardiovascular:     Rate and Rhythm: Regular rhythm.     Heart sounds: Normal heart sounds.  Pulmonary:     Effort: Pulmonary effort is normal. No respiratory distress.     Breath sounds: Normal breath sounds. No wheezing, rhonchi or rales.    Lymphadenopathy:     Head:     Right side of head: No submental, submandibular, tonsillar, preauricular, posterior auricular or occipital adenopathy.     Left side of head: No submental, submandibular, tonsillar, preauricular, posterior auricular or occipital adenopathy.     Cervical: No cervical adenopathy.  Skin:    General: Skin is warm and dry.  Neurological:     Mental Status: He is alert.  Psychiatric:        Speech: Speech normal.        Behavior: Behavior normal.        Assessment & Plan:   Problem List Items Addressed This Visit      Cardiovascular and  Mediastinum   HTN (hypertension)    Stable, continue current regimen        Nervous and Auditory   Open wound of ear    Appears to be actively infected.  Placed patient on doxycycline and referral to ear nose and throat.  Patient understands to stay vigilant in regards to any worsening signs or symptoms to suggest infection spread.      Relevant Medications   doxycycline (VIBRA-TABS) 100 MG tablet     Other   Major depressive disorder, recurrent episode, in partial remission ([place])    Acute on chronic although after speaking in great detail with patient today it does appear that he is improved overall since recent separation. He politely declines pursuing counseling again at this time.  He will continue to follow with psychiatry.        Tobacco abuse     trial nicotine patches and deferred Chantix in the setting of uncontrolled depression. Referral to pharmacist,Catie, for further guidance as well regarding smoking cessation      Relevant Medications   nicotine (NICODERM CQ - DOSED IN MG/24 HOURS) 14 mg/24hr patch   Other Relevant Orders   Referral to Chronic Care Management Services   Wellness examination - Primary    Encourage regular exercise program.  Patient jointly agreed to defer prostate exam in the absence of symptoms and poor utility in digital exam.  We opted to screen with PSA.  Referral for  colonoscopy      Relevant Orders   Ambulatory referral to Gastroenterology   TSH (Completed)   CBC with Differential/Platelet (Completed)   Comprehensive metabolic panel (Completed)   Hemoglobin A1c (Completed)   Lipid panel (Completed)   VITAMIN D 25 Hydroxy (Vit-D Deficiency, Fractures) (Completed)   PSA (Completed)    Other Visit Diagnoses    Need for Tdap vaccination       Relevant Orders   Tdap vaccine greater than or equal to 7yo IM (Completed)       I have discontinued [name]'s mupirocin ointment and hydrocortisone. I am also having him start on nicotine and doxycycline. Additionally, I am having him maintain his amLODipine, sertraline, mirtazapine, amphetamine-dextroamphetamine, amphetamine-dextroamphetamine, and amphetamine-dextroamphetamine.   Meds ordered this encounter  Medications  . nicotine (NICODERM CQ - DOSED IN MG/24 HOURS) 14 mg/24hr patch    Sig: Place 1 patch (14 mg total) onto the skin daily.    Dispense:  30 patch    Refill:  5    Order Specific Question:   Supervising Provider    Answer:   Deborra Medina L [[id number]]  . doxycycline (VIBRA-TABS) 100 MG tablet    Sig: Take 1 tablet (100 mg total) by mouth 2 (two) times daily.    Dispense:  10 tablet    Refill:  0    Order Specific Question:   Supervising Provider    Answer:   Crecencio Mc [[id number]]    Return precautions given.   Risks, benefits, and alternatives of the medications and treatment plan prescribed today were discussed, and patient expressed understanding.   Education regarding symptom management and diagnosis given to patient on AVS.   Continue to follow with Burnard Hawthorne, FNP for routine health maintenance.   [name] and I agreed with plan.   Mable Paris, FNP

## 2020-03-05 NOTE — Patient Instructions (Addendum)
Referral for colonoscopy and ear nose and throat. Let us know if you dont hear back within a week in regards to an appointment being scheduled.   Start doxycycline for ear infection; let me know if doesn't resolve  Ensure to take probiotics while on antibiotics and also for 2 weeks after completion. It is important to re-colonize the gut with good bacteria and also to prevent any diarrheal infections associated with antibiotic use.    Health Maintenance, Male Adopting a healthy lifestyle and getting preventive care are important in promoting health and wellness. Ask your health care provider about:  The right schedule for you to have regular tests and exams.  Things you can do on your own to prevent diseases and keep yourself healthy. What should I know about diet, weight, and exercise? Eat a healthy diet   Eat a diet that includes plenty of vegetables, fruits, low-fat dairy products, and lean protein.  Do not eat a lot of foods that are high in solid fats, added sugars, or sodium. Maintain a healthy weight Body mass index (BMI) is a measurement that can be used to identify possible weight problems. It estimates body fat based on height and weight. Your health care provider can help determine your BMI and help you achieve or maintain a healthy weight. Get regular exercise Get regular exercise. This is one of the most important things you can do for your health. Most adults should:  Exercise for at least 150 minutes each week. The exercise should increase your heart rate and make you sweat (moderate-intensity exercise).  Do strengthening exercises at least twice a week. This is in addition to the moderate-intensity exercise.  Spend less time sitting. Even light physical activity can be beneficial. Watch cholesterol and blood lipids Have your blood tested for lipids and cholesterol at 47 years of age, then have this test every 5 years. You may need to have your cholesterol levels  checked more often if:  Your lipid or cholesterol levels are high.  You are older than 47 years of age.  You are at high risk for heart disease. What should I know about cancer screening? Many types of cancers can be detected early and may often be prevented. Depending on your health history and family history, you may need to have cancer screening at various ages. This may include screening for:  Colorectal cancer.  Prostate cancer.  Skin cancer.  Lung cancer. What should I know about heart disease, diabetes, and high blood pressure? Blood pressure and heart disease  High blood pressure causes heart disease and increases the risk of stroke. This is more likely to develop in people who have high blood pressure readings, are of African descent, or are overweight.  Talk with your health care provider about your target blood pressure readings.  Have your blood pressure checked: ? Every 3-5 years if you are 38-89 years of age. ? Every year if you are 82 years old or older.  If you are between the ages of 79 and 59 and are a current or former smoker, ask your health care provider if you should have a one-time screening for abdominal aortic aneurysm (AAA). Diabetes Have regular diabetes screenings. This checks your fasting blood sugar level. Have the screening done:  Once every three years after age 37 if you are at a normal weight and have a low risk for diabetes.  More often and at a younger age if you are overweight or have a high risk for  diabetes. What should I know about preventing infection? Hepatitis B If you have a higher risk for hepatitis B, you should be screened for this virus. Talk with your health care provider to find out if you are at risk for hepatitis B infection. Hepatitis C Blood testing is recommended for:  Everyone born from [birth year] through 1963.  Anyone with known risk factors for hepatitis C. Sexually transmitted infections (STIs)  You should be screened  each year for STIs, including gonorrhea and chlamydia, if: ? You are sexually active and are younger than 47 years of age. ? You are older than 47 years of age and your health care provider tells you that you are at risk for this type of infection. ? Your sexual activity has changed since you were last screened, and you are at increased risk for chlamydia or gonorrhea. Ask your health care provider if you are at risk.  Ask your health care provider about whether you are at high risk for HIV. Your health care provider may recommend a prescription medicine to help prevent HIV infection. If you choose to take medicine to prevent HIV, you should first get tested for HIV. You should then be tested every 3 months for as long as you are taking the medicine. Follow these instructions at home: Lifestyle  Do not use any products that contain nicotine or tobacco, such as cigarettes, e-cigarettes, and chewing tobacco. If you need help quitting, ask your health care provider.  Do not use street drugs.  Do not share needles.  Ask your health care provider for help if you need support or information about quitting drugs. Alcohol use  Do not drink alcohol if your health care provider tells you not to drink.  If you drink alcohol: ? Limit how much you have to 0-2 drinks a day. ? Be aware of how much alcohol is in your drink. In the [place] one drink equals one 12 oz bottle of beer (355 mL), one 5 oz glass of wine (148 mL), or one 1 oz glass of hard liquor (44 mL). General instructions  Schedule regular health, dental, and eye exams.  Stay current with your vaccines.  Tell your health care provider if: ? You often feel depressed. ? You have ever been abused or do not feel safe at home. Summary  Adopting a healthy lifestyle and getting preventive care are important in promoting health and wellness.  Follow your health care provider's instructions about healthy diet, exercising, and getting tested or  screened for diseases.  Follow your health care provider's instructions on monitoring your cholesterol and blood pressure. This information is not intended to replace advice given to you by your health care provider. Make sure you discuss any questions you have with your health care provider. Document Revised: 09/13/2018 Document Reviewed: 09/13/2018 Elsevier Patient Education  2020 [employer].

## 2020-03-07 ENCOUNTER — Other Ambulatory Visit: Payer: Self-pay

## 2020-03-07 ENCOUNTER — Other Ambulatory Visit: Payer: Self-pay | Admitting: Family

## 2020-03-07 ENCOUNTER — Telehealth: Payer: Self-pay | Admitting: Family

## 2020-03-07 MED ORDER — PRAVASTATIN SODIUM 20 MG PO TABS
20.0000 mg | ORAL_TABLET | Freq: Every day | ORAL | 3 refills | Status: DC
Start: 2020-03-07 — End: 2020-03-07

## 2020-03-07 NOTE — Chronic Care Management (AMB) (Signed)
°  Care Management   Outreach Note  03/07/2020 Name: Shane [name] MRN: [id number] DOB: Apr 23, 1971  Referred by: Allegra Grana, FNP Reason for referral : Care Management (CM Initial outreach unsuccessful)   An unsuccessful telephone outreach was attempted today. The patient was referred to the case management team for assistance with care management and care coordination.   Follow Up Plan: A HIPPA compliant phone message was left for the patient providing contact information and requesting a return call.  The care management team will reach out to the patient again over the next 7 days.  If patient returns call to provider office, please advise to call Embedded Care Management Care Guide Elisha Ponder LPN at [phone]  Elisha Ponder, LPN Health Advisor, Embedded Care Coordination [facility]  Care Management ??[email or web address]  ??[phone]

## 2020-03-07 NOTE — Assessment & Plan Note (Signed)
trial nicotine patches and deferred Chantix in the setting of uncontrolled depression. Referral to pharmacist,Catie, for further guidance as well regarding smoking cessation

## 2020-03-07 NOTE — Assessment & Plan Note (Addendum)
Encourage regular exercise program.  Patient jointly agreed to defer prostate exam in the absence of symptoms and poor utility in digital exam.  We opted to screen with PSA.  Referral for colonoscopy

## 2020-03-07 NOTE — Assessment & Plan Note (Signed)
Appears to be actively infected.  Placed patient on doxycycline and referral to ear nose and throat.  Patient understands to stay vigilant in regards to any worsening signs or symptoms to suggest infection spread.

## 2020-03-07 NOTE — Assessment & Plan Note (Signed)
Acute on chronic although after speaking in great detail with patient today it does appear that he is improved overall since recent separation. He politely declines pursuing counseling again at this time.  He will continue to follow with psychiatry.

## 2020-03-07 NOTE — Assessment & Plan Note (Signed)
Stable, continue current regimen 

## 2020-03-10 ENCOUNTER — Other Ambulatory Visit: Payer: Self-pay | Admitting: Family

## 2020-03-10 DIAGNOSIS — E785 Hyperlipidemia, unspecified: Secondary | ICD-10-CM

## 2020-03-10 DIAGNOSIS — D582 Other hemoglobinopathies: Secondary | ICD-10-CM

## 2020-03-14 NOTE — Chronic Care Management (AMB) (Signed)
  Care Management   Note  03/14/2020 Name: Shane [name] MRN: [id number] DOB: [birth year]  Shane [name] is a 49 y.o. year old male who is a primary care patient of Allegra Grana, FNP. I reached out to [name] by phone today in response to a referral sent by Shane [name]'s health plan.    Shane [name] was given information about care management services today including:  1. Care management services include personalized support from designated clinical staff supervised by his physician, including individualized plan of care and coordination with other care providers 2. 24/7 contact phone numbers for assistance for urgent and routine care needs. 3. The patient may stop care management services at any time by phone call to the office staff.  Patient did not agree to enrollment in care management services and does not wish to consider at this time.  Follow up plan: The care management team is available to follow up with the patient after provider conversation with the patient regarding recommendation for care management engagement and subsequent re-referral to the care management team.   Elisha Ponder, LPN Health Advisor, Embedded Care Coordination [facility] Care Management ??[email or web address] ??[phone]

## 2020-03-19 ENCOUNTER — Telehealth: Payer: Self-pay | Admitting: Family

## 2020-03-19 NOTE — Telephone Encounter (Signed)
Called LVM for pt over a period of 3 different dates & times, not call back from patient. MR I've left vm too  Hematology

## 2020-03-21 ENCOUNTER — Telehealth (INDEPENDENT_AMBULATORY_CARE_PROVIDER_SITE_OTHER): Payer: 59 | Admitting: Psychiatry

## 2020-03-21 ENCOUNTER — Other Ambulatory Visit: Payer: Self-pay

## 2020-03-21 DIAGNOSIS — F3341 Major depressive disorder, recurrent, in partial remission: Secondary | ICD-10-CM | POA: Diagnosis not present

## 2020-03-21 DIAGNOSIS — F900 Attention-deficit hyperactivity disorder, predominantly inattentive type: Secondary | ICD-10-CM

## 2020-03-21 MED ORDER — AMPHETAMINE-DEXTROAMPHETAMINE 20 MG PO TABS: 20.0000 mg | ORAL_TABLET | Freq: Two times a day (BID) | ORAL | 0 refills | Status: DC

## 2020-03-21 NOTE — Progress Notes (Signed)
[place] MD/PA/NP OP Progress Note  03/21/2020 11:42 AM Shane [name]  MRN:  [id number] Interview was conducted by phone and I verified that I was speaking with the correct person using two identifiers. I discussed the limitations of evaluation and management by telemedicine and  the availability of in person appointments. Patient expressed understanding and agreed to proceed. Patient location - home; physician - home office.  Chief Complaint: None.  HPI: 49 yo married (but separated since September last year) male with a hx of depression/anxiety in a setting of marital conflicts. He has become more depressed, anxious, worried, not sleeping well and losing weight (no appetite) after his wife of 21 years Shane [name] decided to split and left their home on August 3rd. She then came back and again left in September and has not returned. He has now been on sertraline for over a year- dose was increased to 100 mg and mirtazapine added. His mood has improved and so is sleep and appetite. At times he would have difficulty falling asleep but once he does sleep is uninterrupted. He denies having SI and has no prior hx of SI/attempts, no hx of inpatient psychiatric admissions. He denies having hx of mania or psychosis. Shane [name] has a hx of problems with concentration, task completion, procrastination since middle school. He was formally diagnosed with ADD in 2014 and started on Adderall. He has no hx of illicit or prescribed drug abuse.   Visit Diagnosis:    ICD-10-CM   1. Attention deficit hyperactivity disorder (ADHD), predominantly inattentive type  F90.0   2. Major depressive disorder, recurrent episode, in partial remission (HCC)  F33.41     Past Psychiatric History: Please see intake H&P.  Past Medical History:  Past Medical History:  Diagnosis Date  . ADD (attention deficit disorder)   . Alcohol abuse    History of.  . Anxiety   . Depression   . Hyperlipidemia   . Hypertension     Past Surgical  History:  Procedure Laterality Date  . TENDON REPAIR Right 09/15/2015   Procedure: RIGHT FOREARM EXPLORATION AND CLOSURE OF LACERATION, TENDON REPAIR;  Surgeon: Iran Planas, MD;  Location: [facility];  Service: Orthopedics;  Laterality: Right;    Family Psychiatric History: Reviewed.  Family History:  Family History  Problem Relation Age of Onset  . Prostate cancer Father   . Hyperlipidemia Father   . Hypertension Father   . Lung cancer Maternal Grandfather   . Heart attack Maternal Grandfather 63       died  . Alcohol abuse Paternal Grandfather   . Hypertension Cousin   . Hyperlipidemia Other   . Heart attack Paternal Uncle 66       died  . Heart attack Maternal Great-grandfather 71  . Alcohol abuse Maternal Aunt     Social History:  Social History   Socioeconomic History  . Marital status: Married    Spouse name: Shane [name]  . Number of children: 2  . Years of education: 28  . Highest education level: Not on file  Occupational History    Comment: [employer]  Tobacco Use  . Smoking status: Current Every Day Smoker    Packs/day: 0.25  . Smokeless tobacco: Never Used  Substance and Sexual Activity  . Alcohol use: No    Comment: no alcohol since 12/16/2012  . Drug use: No  . Sexual activity: Yes    Partners: Female  Other Topics Concern  . Not on file  Social History  Narrative   Separated with wife in 2020      has  two daughters (20 and 65 )    Rebuild induction coils for work   Former smoker x 5-6 years 2 packs per week quit   Social Determinants of Corporate investment banker Strain:   . Difficulty of Paying Living Expenses:   Food Insecurity:   . Worried About Programme researcher, broadcasting/film/video in the Last Year:   . Barista in the Last Year:   Transportation Needs:   . Freight forwarder (Medical):   Marland Kitchen Lack of Transportation (Non-Medical):   Physical Activity:   . Days of Exercise per Week:   . Minutes of Exercise per Session:   Stress:    . Feeling of Stress :   Social Connections:   . Frequency of Communication with Friends and Family:   . Frequency of Social Gatherings with Friends and Family:   . Attends Religious Services:   . Active Member of Clubs or Organizations:   . Attends Banker Meetings:   Marland Kitchen Marital Status:     Allergies: No Known Allergies  Metabolic Disorder Labs: Lab Results  Component Value Date   HGBA1C 5.9 03/05/2020   No results found for: PROLACTIN Lab Results  Component Value Date   CHOL 242 (H) 03/05/2020   TRIG 136.0 03/05/2020   HDL 50.10 03/05/2020   CHOLHDL 5 03/05/2020   VLDL 27.2 03/05/2020   LDLCALC 164 (H) 03/05/2020   LDLCALC 150 (H) 12/05/2017   Lab Results  Component Value Date   TSH 2.44 03/05/2020   TSH 2.15 12/05/2017    Therapeutic Level Labs: No results found for: LITHIUM No results found for: VALPROATE No components found for:  CBMZ  Current Medications: Current Outpatient Medications  Medication Sig Dispense Refill  . amLODipine (NORVASC) 2.5 MG tablet Take 1 tablet (2.5 mg total) by mouth daily. (Patient not taking: Reported on 03/05/2020) 90 tablet 2  . [START ON 05/24/2020] amphetamine-dextroamphetamine (ADDERALL) 20 MG tablet Take 1 tablet (20 mg total) by mouth 2 (two) times daily. 60 tablet 0  . [START ON 04/23/2020] amphetamine-dextroamphetamine (ADDERALL) 20 MG tablet Take 1 tablet (20 mg total) by mouth 2 (two) times daily. 60 tablet 0  . [START ON 03/24/2020] amphetamine-dextroamphetamine (ADDERALL) 20 MG tablet Take 1 tablet (20 mg total) by mouth 2 (two) times daily. 60 tablet 0  . doxycycline (VIBRA-TABS) 100 MG tablet Take 1 tablet (100 mg total) by mouth 2 (two) times daily. 10 tablet 0  . mirtazapine (REMERON) 15 MG tablet Take 1 tablet (15 mg total) by mouth at bedtime. 90 tablet 1  . nicotine (NICODERM CQ - DOSED IN MG/24 HOURS) 14 mg/24hr patch Place 1 patch (14 mg total) onto the skin daily. 30 patch 5  . pravastatin (PRAVACHOL) 20  MG tablet Take 1 tablet (20 mg total) by mouth daily. 90 tablet 3  . sertraline (ZOLOFT) 100 MG tablet Take 1 tablet (100 mg total) by mouth daily. 90 tablet 1   No current facility-administered medications for this visit.     Psychiatric Specialty Exam: Review of Systems  All other systems reviewed and are negative.   There were no vitals taken for this visit.There is no height or weight on file to calculate BMI.  General Appearance: NA  Eye Contact:  NA  Speech:  Clear and Coherent and Normal Rate  Volume:  Normal  Mood:  Somme anxiety.  Affect:  NA  Thought Process:  Goal Directed and Linear  Orientation:  Full (Time, Place, and Person)  Thought Content: Logical   Suicidal Thoughts:  No  Homicidal Thoughts:  No  Memory:  Immediate;   Good Recent;   Good Remote;   Good  Judgement:  Good  Insight:  Good  Psychomotor Activity:  NA  Concentration:  Concentration: Good  Recall:  Good  Fund of Knowledge: Good  Language: Good  Akathisia:  Negative  Handed:  Right  AIMS (if indicated): not done  Assets:  Communication Skills Desire for Improvement Financial Resources/Insurance Housing Resilience Talents/Skills  ADL's:  Intact  Cognition: WNL  Sleep:  Fair   Screenings: PHQ2-9     Office Visit from 03/05/2020 in [facility] [place] Office Visit from 03/02/2019 in [facility] [place] Office Visit from 12/01/2018 in [facility] [place] Office Visit from 12/05/2017 in [facility] [place] Office Visit from 05/29/2016 in [facility] at [facility] Total Score 2 0 0 0 0  PHQ-9 Total Score 6 1 1  -- --       Assessment and Plan: 49 yo married (but separated since September last year) male with a hx of depression/anxiety in a setting of marital conflicts. He has become more depressed, anxious, worried, not sleeping well and losing weight (no appetite) after his wife of 21 years October decided to split and left their home on August  3rd. She then came back and again left in September and has not returned. He has now been on sertraline for over a year- dose was increased to 100 mg and mirtazapine added. His mood has improved and so is sleep and appetite. He denies having SI and has no prior hx of SI/attempts, no hx of inpatient psychiatric admissions. He denies having hx of mania or psychosis. October has a hx of problems with concentration, task completion, procrastination since middle school. He was formally diagnosed with ADD in 2014 and started on Adderall. He has no hx of illicit or prescribed drug abuse.  Dx: Major depressive disorder recurrent in partial remission; Adult ADD; Alcohol use disorder moderate in sustained remission  Plan:Continuesertraline to 100 mg daily,mirtazapine 15 mg at HS for sleep/appetite/moodandAdderall20 mg bid.Next appointment with me in3 months. The plan was discussed with patient who had an opportunity to ask questions and these were all answered. I spend63minutes inphone consultationwith the patient.    30m, MD 03/21/2020, 11:42 AM

## 2020-04-14 ENCOUNTER — Ambulatory Visit: Payer: 59 | Admitting: Family

## 2020-04-14 ENCOUNTER — Encounter: Payer: Self-pay | Admitting: Family

## 2020-04-14 ENCOUNTER — Other Ambulatory Visit: Payer: Self-pay | Admitting: Family

## 2020-04-14 ENCOUNTER — Other Ambulatory Visit: Payer: Self-pay

## 2020-04-14 DIAGNOSIS — F3341 Major depressive disorder, recurrent, in partial remission: Secondary | ICD-10-CM

## 2020-04-14 DIAGNOSIS — I10 Essential (primary) hypertension: Secondary | ICD-10-CM

## 2020-04-14 DIAGNOSIS — R030 Elevated blood-pressure reading, without diagnosis of hypertension: Secondary | ICD-10-CM

## 2020-04-14 DIAGNOSIS — R21 Rash and other nonspecific skin eruption: Secondary | ICD-10-CM | POA: Diagnosis not present

## 2020-04-14 DIAGNOSIS — S01302D Unspecified open wound of left ear, subsequent encounter: Secondary | ICD-10-CM | POA: Diagnosis not present

## 2020-04-14 MED ORDER — AMLODIPINE BESYLATE 2.5 MG PO TABS: 2.5000 mg | ORAL_TABLET | Freq: Every day | ORAL | 2 refills | Status: DC

## 2020-04-14 MED ORDER — DOXYCYCLINE HYCLATE 100 MG PO TABS: 100.0000 mg | ORAL_TABLET | Freq: Two times a day (BID) | ORAL | 0 refills | Status: DC

## 2020-04-14 MED ORDER — MUPIROCIN 2 % EX OINT: 1.0000 "application " | TOPICAL_OINTMENT | Freq: Two times a day (BID) | CUTANEOUS | 2 refills | Status: DC

## 2020-04-14 NOTE — Progress Notes (Signed)
Subjective:    Patient ID: Shane [name], male    DOB: 08-28-1971, 49 y.o.   MRN: [id number]  CC: Shane [name] is a 49 y.o. male who presents today for follow up.   HPI: Feels well No new complaints. Left ear suspected infection completely resolved on doxycycline.  "Best has looked in a long time".  He continues to have nonpruritic small lesions on bilateral thighs which have purulent drainage.  They have nearly dried up on doxycycline however not totally resolved.  Has many scars from this.  He describes himself as a 'picker' as he will mess with the lesions.  Not intensely pruritic.  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  No fever, nausea, vomiting.  No lesions on  Palms of hands or soles of feet.  Has had Bactroban in the past which was helpful  Concern today is that wife has told him she will take him off her insurance plan 5 September.  He is worried about getting his medications. He likes his regimen and would like to find a way to stay on all medications.   Continues to suffer from depression in regards to wife leaving him Close to his 2 daughters .daughters and his mother is able to confide in.  Continues to follow with psychiatry and has an upcoming appointment with psychiatry next month.    HTN- has ran out of amlodipine.  No chest pain, shortness of breath    Has  Been seeing behavorial health , Dr Hinton Dyer. Last appointment 03/21/20 Zoloft 100 mg, mirtazapine 15 mg, Adderall 20 mg twice daily. HISTORY:  Past Medical History:  Diagnosis Date  . ADD (attention deficit disorder)   . Alcohol abuse    History of.  . Anxiety   . Depression   . Hyperlipidemia   . Hypertension    Past Surgical History:  Procedure Laterality Date  . TENDON REPAIR Right 09/15/2015   Procedure: RIGHT FOREARM EXPLORATION AND CLOSURE OF LACERATION, TENDON REPAIR;  Surgeon: Bradly Bienenstock, MD;   Location: [facility] OR;  Service: Orthopedics;  Laterality: Right;   Family History  Problem Relation Age of Onset  . Prostate cancer Father   . Hyperlipidemia Father   . Hypertension Father   . Lung cancer Maternal Grandfather   . Heart attack Maternal Grandfather 66       died  . Alcohol abuse Paternal Grandfather   . Hypertension Cousin   . Hyperlipidemia Other   . Heart attack Paternal Uncle 52       died  . Heart attack Maternal Great-grandfather 71  . Alcohol abuse Maternal Aunt     Allergies: Patient has no known allergies. Current Outpatient Medications on File Prior to Visit  Medication Sig Dispense Refill  . [START ON 05/24/2020] amphetamine-dextroamphetamine (ADDERALL) 20 MG tablet Take 1 tablet (20 mg total) by mouth 2 (two) times daily. 60 tablet 0  . [START ON 04/23/2020] amphetamine-dextroamphetamine (ADDERALL) 20 MG tablet Take 1 tablet (20 mg total) by mouth 2 (two) times daily. 60 tablet 0  . amphetamine-dextroamphetamine (ADDERALL) 20 MG tablet Take 1 tablet (20 mg total) by mouth 2 (two) times daily. 60 tablet 0  . mirtazapine (REMERON) 15 MG tablet Take 1 tablet (15 mg total) by mouth at bedtime. 90 tablet 1  . nicotine (NICODERM CQ - DOSED IN MG/24 HOURS) 14 mg/24hr patch Place 1 patch (14 mg total)  onto the skin daily. 30 patch 5  . pravastatin (PRAVACHOL) 20 MG tablet Take 1 tablet (20 mg total) by mouth daily. 90 tablet 3  . sertraline (ZOLOFT) 100 MG tablet Take 1 tablet (100 mg total) by mouth daily. 90 tablet 1   No current facility-administered medications on file prior to visit.    Social History   Tobacco Use  . Smoking status: Current Every Day Smoker    Packs/day: 0.25  . Smokeless tobacco: Never Used  Substance Use Topics  . Alcohol use: No    Comment: no alcohol since 12/16/2012  . Drug use: No    Review of Systems  Constitutional: Negative for chills and fever.  Respiratory: Negative for cough.   Cardiovascular: Negative for chest pain and  palpitations.  Gastrointestinal: Negative for nausea and vomiting.  Skin: Positive for rash and wound.      Objective:    BP (!) 138/96 (BP Location: Left Arm, Patient Position: Sitting)   Pulse 94   Temp 98.1 F (36.7 C)   Ht 6' 1.74" (1.873 m)   Wt 210 lb (95.3 kg)   SpO2 98%   BMI 27.15 kg/m  BP Readings from Last 3 Encounters:  04/14/20 (!) 138/96  03/05/20 124/82  07/03/19 128/64   Wt Readings from Last 3 Encounters:  04/14/20 210 lb (95.3 kg)  03/05/20 209 lb 9.6 oz (95.1 kg)  07/03/19 190 lb (86.2 kg)    Physical Exam Vitals reviewed.  Constitutional:      Appearance: He is well-developed.  HENT:     Ears:     Comments: Left auricle lesion resolved. Cardiovascular:     Rate and Rhythm: Regular rhythm.     Heart sounds: Normal heart sounds.  Pulmonary:     Effort: Pulmonary effort is normal. No respiratory distress.     Breath sounds: Normal breath sounds. No wheezing, rhonchi or rales.  Skin:    General: Skin is warm and dry.          Comments: Erythematous macules noted, dime size and smaller bilateral thighs. No purulent discharge. Non fluctuant. No increased warmth, streaking  Neurological:     Mental Status: He is alert.  Psychiatric:        Speech: Speech normal.        Behavior: Behavior normal.        Assessment & Plan:   Problem List Items Addressed This Visit      Cardiovascular and Mediastinum   HTN (hypertension)    Elevated today as patient has run out of amlodipine.  This has been refilled and patient understands to start this medication again today.  Advised him to monitor blood pressure at home to ensure blood pressure at goal.  As it relates to insurance, patient will let me know if he decides to go to the market and purchase health insurance plan or if he goes uninsured.  This will dictate how we move forward with affording medications.  We did discuss Medication Assistance pharmacy via [facility] and also consult with pharmacist.   Patient will let me know what he decides on in regards to insurance so I can help with staying on medications.       Relevant Medications   amLODipine (NORVASC) 2.5 MG tablet     Musculoskeletal and Integument   Rash    Afebrile. No systemic features. Working diagnosis of bacterial folliculitis suspected by staph. Alternately considers acne vulgaris. Distribution not consistent with hidradenitis suppurativa.   Exacerbated  by "picking" by patient.  Opted to not change antibiotic to keflex as patient had significant improvement on doxycycline.  He understands to take probiotics with this medication.  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 If not complete resolution of symptoms with second course of doxycycline, will consult dermatology for further evaluation, confirmation of diagnosis.        Other   Major depressive disorder, recurrent episode, in partial remission (HCC)    Stable, following with psychiatry.  Will follow       Other Visit Diagnoses    Elevated blood pressure reading       Relevant Medications   amLODipine (NORVASC) 2.5 MG tablet   Open wound of left external ear, subsequent encounter       Relevant Medications   doxycycline (VIBRA-TABS) 100 MG tablet   mupirocin ointment (BACTROBAN) 2 %       I am having [name] start on mupirocin ointment. I am also having him maintain his sertraline, mirtazapine, nicotine, pravastatin, amphetamine-dextroamphetamine, amphetamine-dextroamphetamine, amphetamine-dextroamphetamine, amLODipine, and doxycycline.   Meds ordered this encounter  Medications  . amLODipine (NORVASC) 2.5 MG tablet    Sig: Take 1 tablet (2.5 mg total) by mouth daily.    Dispense:  90 tablet    Refill:  2    Order Specific Question:   Supervising Provider    Answer:   Duncan Dull L [[id number]]  . doxycycline (VIBRA-TABS) 100 MG tablet      Sig: Take 1 tablet (100 mg total) by mouth 2 (two) times daily.    Dispense:  10 tablet    Refill:  0    Order Specific Question:   Supervising Provider    Answer:   Duncan Dull L [[id number]]  . mupirocin ointment (BACTROBAN) 2 %    Sig: Apply 1 application topically 2 (two) times daily.    Dispense:  30 g    Refill:  2    Order Specific Question:   Supervising Provider    Answer:   Sherlene Shams [[id number]]    Return precautions given.   Risks, benefits, and alternatives of the medications and treatment plan prescribed today were discussed, and patient expressed understanding.   Education regarding symptom management and diagnosis given to patient on AVS.  Continue to follow with Allegra Grana, FNP for routine health maintenance.   [name] and I agreed with plan.   Rennie Plowman, FNP

## 2020-04-14 NOTE — Patient Instructions (Addendum)
Restart amlodipine  Contact GI to have colonoscopy done   Call hematology to schedule consult; let me know if you need new [phone].  Use Dial soap as preventative  Start doxycyline again; avoid the sun while on.   Try not to pick sores.   Ensure to take probiotics while on antibiotics and also for 2 weeks after completion. It is important to re-colonize the gut with good bacteria and also to prevent any diarrheal infections associated with antibiotic use.   Let me know about which insurance you choose so I can work with pharmacy.

## 2020-04-15 ENCOUNTER — Encounter: Payer: Self-pay | Admitting: Family

## 2020-04-15 DIAGNOSIS — R21 Rash and other nonspecific skin eruption: Secondary | ICD-10-CM | POA: Insufficient documentation

## 2020-04-15 NOTE — Assessment & Plan Note (Addendum)
Elevated today as patient has run out of amlodipine.  This has been refilled and patient understands to start this medication again today.  Advised him to monitor blood pressure at home to ensure blood pressure at goal.  As it relates to insurance, patient will let me know if he decides to go to the market and purchase health insurance plan or if he goes uninsured.  This will dictate how we move forward with affording medications.  We did discuss Medication Assistance pharmacy via [facility] and also consult with pharmacist.  Patient will let me know what he decides on in regards to insurance so I can help with staying on medications.

## 2020-04-15 NOTE — Assessment & Plan Note (Addendum)
Stable, following with psychiatry.  Will follow

## 2020-04-15 NOTE — Assessment & Plan Note (Addendum)
Afebrile. No systemic features. Working diagnosis of bacterial folliculitis suspected by staph. Alternately considers acne vulgaris. Distribution not consistent with hidradenitis suppurativa.   Exacerbated by "picking" by patient.  Opted to not change antibiotic to keflex as patient had significant improvement on doxycycline.  He understands to take probiotics with this medication.  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 If not complete resolution of symptoms with second course of doxycycline, will consult dermatology for further evaluation, confirmation of diagnosis.

## 2020-05-07 ENCOUNTER — Telehealth: Payer: Self-pay | Admitting: Family

## 2020-05-07 NOTE — Telephone Encounter (Signed)
"  Rejection Reason - Patient did not respond" [facility] said 6 days ago 

## 2020-06-04 ENCOUNTER — Encounter: Payer: Self-pay | Admitting: Family

## 2020-06-04 ENCOUNTER — Telehealth (INDEPENDENT_AMBULATORY_CARE_PROVIDER_SITE_OTHER): Payer: 59 | Admitting: Family

## 2020-06-04 DIAGNOSIS — F3341 Major depressive disorder, recurrent, in partial remission: Secondary | ICD-10-CM

## 2020-06-04 DIAGNOSIS — S01302D Unspecified open wound of left ear, subsequent encounter: Secondary | ICD-10-CM

## 2020-06-04 DIAGNOSIS — R21 Rash and other nonspecific skin eruption: Secondary | ICD-10-CM

## 2020-06-04 DIAGNOSIS — I10 Essential (primary) hypertension: Secondary | ICD-10-CM

## 2020-06-04 MED ORDER — MUPIROCIN 2 % EX OINT: 1.0000 "application " | TOPICAL_OINTMENT | Freq: Two times a day (BID) | CUTANEOUS | 2 refills | Status: DC

## 2020-06-04 NOTE — Assessment & Plan Note (Signed)
Stable to improved on zoloft, remeron. Will follow.

## 2020-06-04 NOTE — Assessment & Plan Note (Signed)
No readings from home. Advised to have in person f/u within 3 months.

## 2020-06-04 NOTE — Progress Notes (Signed)
Virtual Visit via Video Note  I connected with@  on 06/04/20 at  3:00 PM EDT by a video enabled telemedicine application and verified that I am speaking with the correct person using two identifiers.  Location patient: home Location provider:work  Persons participating in the virtual visit: patient, provider  I discussed the limitations of evaluation and management by telemedicine and the availability of in person appointments. The patient expressed understanding and agreed to proceed.   HPI: Feels well today No new complaints.   Rash has greatly improved to resolved on doxycycline. Dial soap and bactroban has helped has well.   HTN- compliant with amlodipine. No cp.   ADHD- follows with psychiatry however interested in coming back here for prescription management  Divorce finalized tomorrow and feeling 'weight off his shoulders'. Feels best he has felt in a long time.   Depression and anxiety- compliant with zoloft, remeron.   ROS: See pertinent positives and negatives per HPI.  Past Medical History:  Diagnosis Date  . ADD (attention deficit disorder)   . Alcohol abuse    History of.  . Anxiety   . Depression   . Hyperlipidemia   . Hypertension     Past Surgical History:  Procedure Laterality Date  . TENDON REPAIR Right 09/15/2015   Procedure: RIGHT FOREARM EXPLORATION AND CLOSURE OF LACERATION, TENDON REPAIR;  Surgeon: Bradly Bienenstock, MD;  Location: [facility] OR;  Service: Orthopedics;  Laterality: Right;    Family History  Problem Relation Age of Onset  . Prostate cancer Father   . Hyperlipidemia Father   . Hypertension Father   . Lung cancer Maternal Grandfather   . Heart attack Maternal Grandfather 66       died  . Alcohol abuse Paternal Grandfather   . Hypertension Cousin   . Hyperlipidemia Other   . Heart attack Paternal Uncle 52       died  . Heart attack Maternal Great-grandfather 71  . Alcohol abuse Maternal Aunt       Current Outpatient Medications:   .  amLODipine (NORVASC) 2.5 MG tablet, Take 1 tablet (2.5 mg total) by mouth daily., Disp: 90 tablet, Rfl: 2 .  amphetamine-dextroamphetamine (ADDERALL) 20 MG tablet, Take 1 tablet (20 mg total) by mouth 2 (two) times daily., Disp: 60 tablet, Rfl: 0 .  mirtazapine (REMERON) 15 MG tablet, Take 1 tablet (15 mg total) by mouth at bedtime., Disp: 90 tablet, Rfl: 1 .  mupirocin ointment (BACTROBAN) 2 %, Apply 1 application topically 2 (two) times daily., Disp: 30 g, Rfl: 2 .  pravastatin (PRAVACHOL) 20 MG tablet, Take 1 tablet (20 mg total) by mouth daily., Disp: 90 tablet, Rfl: 3 .  sertraline (ZOLOFT) 100 MG tablet, Take 1 tablet (100 mg total) by mouth daily., Disp: 90 tablet, Rfl: 1 .  amphetamine-dextroamphetamine (ADDERALL) 20 MG tablet, Take 1 tablet (20 mg total) by mouth 2 (two) times daily., Disp: 60 tablet, Rfl: 0 .  amphetamine-dextroamphetamine (ADDERALL) 20 MG tablet, Take 1 tablet (20 mg total) by mouth 2 (two) times daily., Disp: 60 tablet, Rfl: 0 .  nicotine (NICODERM CQ - DOSED IN MG/24 HOURS) 14 mg/24hr patch, Place 1 patch (14 mg total) onto the skin daily. (Patient not taking: Reported on 06/04/2020), Disp: 30 patch, Rfl: 5  EXAM:  VITALS per patient if applicable: BP Readings from Last 3 Encounters:  04/14/20 (!) 138/96  03/05/20 124/82  07/03/19 128/64     GENERAL: alert, oriented, appears well and in no acute distress  HEENT: atraumatic, conjunttiva clear, no obvious abnormalities on inspection of external nose and ears  NECK: normal movements of the head and neck  LUNGS: on inspection no signs of respiratory distress, breathing rate appears normal, no obvious gross SOB, gasping or wheezing  CV: no obvious cyanosis  MS: moves all visible extremities without noticeable abnormality  PSYCH/NEURO: pleasant and cooperative, no obvious depression or anxiety, speech and thought processing grossly intact  ASSESSMENT AND PLAN:  Discussed the following assessment and  plan:  Open wound of left external ear, subsequent encounter - Plan: mupirocin ointment (BACTROBAN) 2 %  Essential hypertension  Rash  Major depressive disorder, recurrent episode, in partial remission (HCC) Problem List Items Addressed This Visit      Cardiovascular and Mediastinum   HTN (hypertension)    No readings from home. Advised to have in person f/u within 3 months.        Musculoskeletal and Integument   Rash    Significantly improved to resolved. If recurs, I have asked him to let me know so we can consult dermatology for second opinion.        Other   Major depressive disorder, recurrent episode, in partial remission (HCC)    Stable to improved on zoloft, remeron. Will follow.       Other Visit Diagnoses    Open wound of left external ear, subsequent encounter       Relevant Medications   mupirocin ointment (BACTROBAN) 2 %     -we discussed possible serious and likely etiologies, options for evaluation and workup, limitations of telemedicine visit vs in person visit, treatment, treatment risks and precautions. Pt prefers to treat via telemedicine empirically rather then risking or undertaking an in person visit at this moment.     I discussed the assessment and treatment plan with the patient. The patient was provided an opportunity to ask questions and all were answered. The patient agreed with the plan and demonstrated an understanding of the instructions.   The patient was advised to call back or seek an in-person evaluation if the symptoms worsen or if the condition fails to improve as anticipated.   Rennie Plowman, FNP

## 2020-06-04 NOTE — Assessment & Plan Note (Signed)
Significantly improved to resolved. If recurs, I have asked him to let me know so we can consult dermatology for second opinion.

## 2020-06-13 ENCOUNTER — Other Ambulatory Visit (HOSPITAL_COMMUNITY): Payer: Self-pay | Admitting: Psychiatry

## 2020-06-13 ENCOUNTER — Other Ambulatory Visit: Payer: Self-pay

## 2020-06-13 ENCOUNTER — Telehealth (INDEPENDENT_AMBULATORY_CARE_PROVIDER_SITE_OTHER): Payer: 59 | Admitting: Psychiatry

## 2020-06-13 DIAGNOSIS — F3341 Major depressive disorder, recurrent, in partial remission: Secondary | ICD-10-CM | POA: Diagnosis not present

## 2020-06-13 DIAGNOSIS — F900 Attention-deficit hyperactivity disorder, predominantly inattentive type: Secondary | ICD-10-CM

## 2020-06-13 DIAGNOSIS — F330 Major depressive disorder, recurrent, mild: Secondary | ICD-10-CM

## 2020-06-13 MED ORDER — AMPHETAMINE-DEXTROAMPHETAMINE 20 MG PO TABS: 20.0000 mg | ORAL_TABLET | Freq: Two times a day (BID) | ORAL | 0 refills | Status: DC

## 2020-06-13 MED ORDER — AMPHETAMINE-DEXTROAMPHETAMINE 20 MG PO TABS
20.0000 mg | ORAL_TABLET | Freq: Two times a day (BID) | ORAL | 0 refills | Status: DC
Start: 2020-07-24 — End: 2020-09-19

## 2020-06-13 MED ORDER — AMPHETAMINE-DEXTROAMPHETAMINE 20 MG PO TABS
20.0000 mg | ORAL_TABLET | Freq: Two times a day (BID) | ORAL | 0 refills | Status: DC
Start: 2020-06-24 — End: 2020-09-19

## 2020-06-13 MED ORDER — MIRTAZAPINE 15 MG PO TABS: 15.0000 mg | ORAL_TABLET | Freq: Every day | ORAL | 1 refills | Status: DC

## 2020-06-13 MED ORDER — SERTRALINE HCL 100 MG PO TABS: 100.0000 mg | ORAL_TABLET | Freq: Every day | ORAL | 1 refills | Status: DC

## 2020-06-13 NOTE — Progress Notes (Signed)
BH MD/PA/NP OP Progress Note  06/13/2020 11:47 AM JORGEN WOLFINGER  MRN:  [id number] Interview was conducted by phone and I verified that I was speaking with the correct person using two identifiers. I discussed the limitations of evaluation and management by telemedicine and  the availability of in person appointments. Patient expressed understanding and agreed to proceed. Patient location - home; physician - home office.  Chief Complaint: None.  HPI: 49 yo separated since September last year and soon to be divorced male with a hx of depression/anxiety in a setting of marital conflicts. He has become more depressed, anxious, worried, not sleeping well and losing weight (no appetite) after his wife of 21 years Lyla Son decided to split and left their home on August 3rd. She then came back and again left in September and has now asked for a divorce. Vincenza Hews is not happy about it but feels as though a "weight has been lifted" off his shoulders. He has been on sertraline for over a year- dose was increased to 100 mg and mirtazapine added. His mood has improved and so is sleep and appetite. He denies having SI and has no prior hx of SI/attempts, no hx of inpatient psychiatric admissions. He denies having hx of mania or psychosis. Vincenza Hews has a hx of problems with concentration, task completion, procrastination since middle school. He was formally diagnosed with ADD in 2014 and started on Adderall. Current dose works well and is well tolerated.    Visit Diagnosis:    ICD-10-CM   1. Attention deficit hyperactivity disorder (ADHD), predominantly inattentive type  F90.0   2. Mild episode of recurrent major depressive disorder (HCC)  F33.0 sertraline (ZOLOFT) 100 MG tablet  3. Major depressive disorder, recurrent episode, in partial remission (HCC)  F33.41     Past Psychiatric History: Please see intake H&P.  Past Medical History:  Past Medical History:  Diagnosis Date  . ADD (attention deficit  disorder)   . Alcohol abuse    History of.  . Anxiety   . Depression   . Hyperlipidemia   . Hypertension     Past Surgical History:  Procedure Laterality Date  . TENDON REPAIR Right 09/15/2015   Procedure: RIGHT FOREARM EXPLORATION AND CLOSURE OF LACERATION, TENDON REPAIR;  Surgeon: Bradly Bienenstock, MD;  Location: [facility];  Service: Orthopedics;  Laterality: Right;    Family Psychiatric History: Reviewed.  Family History:  Family History  Problem Relation Age of Onset  . Prostate cancer Father   . Hyperlipidemia Father   . Hypertension Father   . Lung cancer Maternal Grandfather   . Heart attack Maternal Grandfather 66       died  . Alcohol abuse Paternal Grandfather   . Hypertension Cousin   . Hyperlipidemia Other   . Heart attack Paternal Uncle 52       died  . Heart attack Maternal Great-grandfather 71  . Alcohol abuse Maternal Aunt     Social History:  Social History   Socioeconomic History  . Marital status: Married    Spouse name: Maikol Grassia  . Number of children: 2  . Years of education: 57  . Highest education level: Not on file  Occupational History    Comment: [employer]  Tobacco Use  . Smoking status: Current Every Day Smoker    Packs/day: 0.25  . Smokeless tobacco: Never Used  Substance and Sexual Activity  . Alcohol use: No    Comment: no alcohol since 12/16/2012  . Drug  use: No  . Sexual activity: Yes    Partners: Female  Other Topics Concern  . Not on file  Social History Narrative   Separated with wife in 2020      has  two daughters (20 and 73 )    Rebuild induction coils for work   Former smoker x 5-6 years 2 packs per week quit   Social Determinants of Corporate investment banker Strain:   . Difficulty of Paying Living Expenses: Not on file  Food Insecurity:   . Worried About Programme researcher, broadcasting/film/video in the Last Year: Not on file  . Ran Out of Food in the Last Year: Not on file  Transportation Needs:   . Lack of  Transportation (Medical): Not on file  . Lack of Transportation (Non-Medical): Not on file  Physical Activity:   . Days of Exercise per Week: Not on file  . Minutes of Exercise per Session: Not on file  Stress:   . Feeling of Stress : Not on file  Social Connections:   . Frequency of Communication with Friends and Family: Not on file  . Frequency of Social Gatherings with Friends and Family: Not on file  . Attends Religious Services: Not on file  . Active Member of Clubs or Organizations: Not on file  . Attends Banker Meetings: Not on file  . Marital Status: Not on file    Allergies: No Known Allergies  Metabolic Disorder Labs: Lab Results  Component Value Date   HGBA1C 5.9 03/05/2020   No results found for: PROLACTIN Lab Results  Component Value Date   CHOL 242 (H) 03/05/2020   TRIG 136.0 03/05/2020   HDL 50.10 03/05/2020   CHOLHDL 5 03/05/2020   VLDL 27.2 03/05/2020   LDLCALC 164 (H) 03/05/2020   LDLCALC 150 (H) 12/05/2017   Lab Results  Component Value Date   TSH 2.44 03/05/2020   TSH 2.15 12/05/2017    Therapeutic Level Labs: No results found for: LITHIUM No results found for: VALPROATE No components found for:  CBMZ  Current Medications: Current Outpatient Medications  Medication Sig Dispense Refill  . amLODipine (NORVASC) 2.5 MG tablet Take 1 tablet (2.5 mg total) by mouth daily. 90 tablet 2  . amphetamine-dextroamphetamine (ADDERALL) 20 MG tablet Take 1 tablet (20 mg total) by mouth 2 (two) times daily. 60 tablet 0  . amphetamine-dextroamphetamine (ADDERALL) 20 MG tablet Take 1 tablet (20 mg total) by mouth 2 (two) times daily. 60 tablet 0  . amphetamine-dextroamphetamine (ADDERALL) 20 MG tablet Take 1 tablet (20 mg total) by mouth 2 (two) times daily. 60 tablet 0  . [START ON 06/24/2020] amphetamine-dextroamphetamine (ADDERALL) 20 MG tablet Take 1 tablet (20 mg total) by mouth 2 (two) times daily. 60 tablet 0  . [START ON 07/24/2020]  amphetamine-dextroamphetamine (ADDERALL) 20 MG tablet Take 1 tablet (20 mg total) by mouth 2 (two) times daily. 60 tablet 0  . [START ON 08/24/2020] amphetamine-dextroamphetamine (ADDERALL) 20 MG tablet Take 1 tablet (20 mg total) by mouth 2 (two) times daily. 60 tablet 0  . [START ON 06/18/2020] mirtazapine (REMERON) 15 MG tablet Take 1 tablet (15 mg total) by mouth at bedtime. 90 tablet 1  . mupirocin ointment (BACTROBAN) 2 % Apply 1 application topically 2 (two) times daily. 30 g 2  . nicotine (NICODERM CQ - DOSED IN MG/24 HOURS) 14 mg/24hr patch Place 1 patch (14 mg total) onto the skin daily. (Patient not taking: Reported on 06/04/2020) 30 patch  5  . pravastatin (PRAVACHOL) 20 MG tablet Take 1 tablet (20 mg total) by mouth daily. 90 tablet 3  . [START ON 06/18/2020] sertraline (ZOLOFT) 100 MG tablet Take 1 tablet (100 mg total) by mouth daily. 90 tablet 1   No current facility-administered medications for this visit.       Psychiatric Specialty Exam: Review of Systems  All other systems reviewed and are negative.   There were no vitals taken for this visit.There is no height or weight on file to calculate BMI.  General Appearance: NA  Eye Contact:  NA  Speech:  Clear and Coherent and Normal Rate  Volume:  Normal  Mood:  Mild depression (situational).  Affect:  NA  Thought Process:  Goal Directed and Linear  Orientation:  Full (Time, Place, and Person)  Thought Content: Logical   Suicidal Thoughts:  No  Homicidal Thoughts:  No  Memory:  Immediate;   Good Recent;   Good Remote;   Good  Judgement:  Good  Insight:  Good  Psychomotor Activity:  NA  Concentration:  Concentration: Good  Recall:  Good  Fund of Knowledge: Good  Language: Good  Akathisia:  Negative  Handed:  Right  AIMS (if indicated): not done  Assets:  Communication Skills Desire for Improvement Financial Resources/Insurance Housing Physical Health Resilience Talents/Skills  ADL's:  Intact  Cognition: WNL   Sleep:  Good   Screenings: PHQ2-9     Office Visit from 04/14/2020 in [facility] [place] Office Visit from 03/05/2020 in [facility] [place] Office Visit from 03/02/2019 in [facility] [place] Office Visit from 12/01/2018 in [facility] [place] Office Visit from 12/05/2017 in [facility]  PHQ-2 Total Score 2 2 0 0 0  PHQ-9 Total Score 3 6 1 1  --       Assessment and Plan: 49 yo separated since September last year and soon to be divorced male with a hx of depression/anxiety in a setting of marital conflicts. He has become more depressed, anxious, worried, not sleeping well and losing weight (no appetite) after his wife of 21 years October decided to split and left their home on August 3rd. She then came back and again left in September and has now asked for a divorce. October is not happy about it but feels as though a "weight has been lifted" off his shoulders. He has been on sertraline for over a year- dose was increased to 100 mg and mirtazapine added. His mood has improved and so is sleep and appetite. He denies having SI and has no prior hx of SI/attempts, no hx of inpatient psychiatric admissions. He denies having hx of mania or psychosis. Vincenza Hews has a hx of problems with concentration, task completion, procrastination since middle school. He was formally diagnosed with ADD in 2014 and started on Adderall. Current dose works well and is well tolerated.  Dx: Major depressive disorder recurrentin partial remission; Adult ADD; Alcohol use disorder moderate in sustained remission  Plan:Continuesertraline to 100 mg daily,mirtazapine 15 mg at HS for sleep/appetite/moodandAdderall20 mg bid.Next appointment with me in3 months. The plan was discussed with patient who had an opportunity to ask questions and these were all answered. I spend23minutes inphone consultationwith the patient.    12m, MD 06/13/2020,  11:47 AM

## 2020-07-16 DIAGNOSIS — H5213 Myopia, bilateral: Secondary | ICD-10-CM | POA: Diagnosis not present

## 2020-09-10 ENCOUNTER — Telehealth: Payer: Self-pay | Admitting: Family

## 2020-09-10 NOTE — Telephone Encounter (Signed)
LMTCB to get patient scheduled for fasting labs.

## 2020-09-10 NOTE — Telephone Encounter (Signed)
Call pt Due for f/u appt for cholesterol and repeat labs Please sch in person, ensure fasting

## 2020-09-16 NOTE — Telephone Encounter (Signed)
Mychart message sent to patient.

## 2020-09-19 ENCOUNTER — Other Ambulatory Visit: Payer: Self-pay | Admitting: Psychiatry

## 2020-09-19 ENCOUNTER — Telehealth (INDEPENDENT_AMBULATORY_CARE_PROVIDER_SITE_OTHER): Payer: 59 | Admitting: Psychiatry

## 2020-09-19 ENCOUNTER — Other Ambulatory Visit: Payer: Self-pay

## 2020-09-19 DIAGNOSIS — F900 Attention-deficit hyperactivity disorder, predominantly inattentive type: Secondary | ICD-10-CM

## 2020-09-19 DIAGNOSIS — F3341 Major depressive disorder, recurrent, in partial remission: Secondary | ICD-10-CM | POA: Diagnosis not present

## 2020-09-19 MED ORDER — AMPHETAMINE-DEXTROAMPHETAMINE 20 MG PO TABS: 20.0000 mg | ORAL_TABLET | Freq: Two times a day (BID) | ORAL | 0 refills | Status: DC

## 2020-09-19 NOTE — Progress Notes (Signed)
BH MD/PA/NP OP Progress Note  09/19/2020 11:42 AM EJ PINSON  MRN:  [id number] Interview was conducted by phone and I verified that I was speaking with the correct person using two identifiers. I discussed the limitations of evaluation and management by telemedicine and  the availability of in person appointments. Patient expressed understanding and agreed to proceed. Participants in the visit: patient (location - home); physician (location - home office).  Chief Complaint: Some depression (situational).  HPI: 49yo divorcedmale with a hx of depression/anxiety in a setting of marital conflicts. He has become more depressed, anxious, worried, not sleeping well and losing weight (no appetite) after his wife of 22 years Lyla Son decided to split and left their home on August 3rd 2020.They have since divorced. They have 38 year old daughter who independently and 12 yo daughter who is with him every other week (she is dealing with parents' divorce well). Vincenza Hews has been on sertraline forover two years- dose was increased last year to 100 mg and mirtazapine added. His mood has improved and so have sleep and appetite. He denies having SIandhas no prior hx of SI/attempts, no hx of inpatient psychiatric admissions. Vincenza Hews has a hx of problems with concentration, task completion, procrastination since middle school. He was formally diagnosed with ADD in 2014 and started on Adderall. Current dose works well and is well tolerated.    Visit Diagnosis:    ICD-10-CM   1. Major depressive disorder, recurrent episode, in partial remission (HCC)  F33.41   2. Attention deficit hyperactivity disorder (ADHD), predominantly inattentive type  F90.0     Past Psychiatric History: Please see intake H&P.  Past Medical History:  Past Medical History:  Diagnosis Date  . ADD (attention deficit disorder)   . Alcohol abuse    History of.  . Anxiety   . Depression   . Hyperlipidemia   . Hypertension      Past Surgical History:  Procedure Laterality Date  . TENDON REPAIR Right 09/15/2015   Procedure: RIGHT FOREARM EXPLORATION AND CLOSURE OF LACERATION, TENDON REPAIR;  Surgeon: Bradly Bienenstock, MD;  Location: [facility];  Service: Orthopedics;  Laterality: Right;    Family Psychiatric History: Reviewed.  Family History:  Family History  Problem Relation Age of Onset  . Prostate cancer Father   . Hyperlipidemia Father   . Hypertension Father   . Lung cancer Maternal Grandfather   . Heart attack Maternal Grandfather 66       died  . Alcohol abuse Paternal Grandfather   . Hypertension Cousin   . Hyperlipidemia Other   . Heart attack Paternal Uncle 52       died  . Heart attack Maternal Great-grandfather 71  . Alcohol abuse Maternal Aunt     Social History:  Social History   Socioeconomic History  . Marital status: Married    Spouse name: Derald Lorge  . Number of children: 2  . Years of education: 55  . Highest education level: Not on file  Occupational History    Comment: [employer]  Tobacco Use  . Smoking status: Current Every Day Smoker    Packs/day: 0.25  . Smokeless tobacco: Never Used  Substance and Sexual Activity  . Alcohol use: No    Comment: no alcohol since 12/16/2012  . Drug use: No  . Sexual activity: Yes    Partners: Female  Other Topics Concern  . Not on file  Social History Narrative   Separated with wife in 2020  has  two daughters (20 and 76 )    Rebuild induction coils for work   Former smoker x 5-6 years 2 packs per week quit   Social Determinants of Corporate investment banker Strain: Not on file  Food Insecurity: Not on file  Transportation Needs: Not on file  Physical Activity: Not on file  Stress: Not on file  Social Connections: Not on file    Allergies: No Known Allergies  Metabolic Disorder Labs: Lab Results  Component Value Date   HGBA1C 5.9 03/05/2020   No results found for: PROLACTIN Lab Results  Component  Value Date   CHOL 242 (H) 03/05/2020   TRIG 136.0 03/05/2020   HDL 50.10 03/05/2020   CHOLHDL 5 03/05/2020   VLDL 27.2 03/05/2020   LDLCALC 164 (H) 03/05/2020   LDLCALC 150 (H) 12/05/2017   Lab Results  Component Value Date   TSH 2.44 03/05/2020   TSH 2.15 12/05/2017    Therapeutic Level Labs: No results found for: LITHIUM No results found for: VALPROATE No components found for:  CBMZ  Current Medications: Current Outpatient Medications  Medication Sig Dispense Refill  . amLODipine (NORVASC) 2.5 MG tablet Take 1 tablet (2.5 mg total) by mouth daily. 90 tablet 2  . amphetamine-dextroamphetamine (ADDERALL) 20 MG tablet Take 1 tablet (20 mg total) by mouth 2 (two) times daily. 60 tablet 0  . [START ON 12/01/2020] amphetamine-dextroamphetamine (ADDERALL) 20 MG tablet Take 1 tablet (20 mg total) by mouth 2 (two) times daily. 60 tablet 0  . [START ON 11/03/2020] amphetamine-dextroamphetamine (ADDERALL) 20 MG tablet Take 1 tablet (20 mg total) by mouth 2 (two) times daily. 60 tablet 0  . [START ON 10/03/2020] amphetamine-dextroamphetamine (ADDERALL) 20 MG tablet Take 1 tablet (20 mg total) by mouth 2 (two) times daily. 60 tablet 0  . mirtazapine (REMERON) 15 MG tablet Take 1 tablet (15 mg total) by mouth at bedtime. 90 tablet 1  . mupirocin ointment (BACTROBAN) 2 % Apply 1 application topically 2 (two) times daily. 30 g 2  . nicotine (NICODERM CQ - DOSED IN MG/24 HOURS) 14 mg/24hr patch Place 1 patch (14 mg total) onto the skin daily. (Patient not taking: Reported on 06/04/2020) 30 patch 5  . pravastatin (PRAVACHOL) 20 MG tablet Take 1 tablet (20 mg total) by mouth daily. 90 tablet 3  . sertraline (ZOLOFT) 100 MG tablet Take 1 tablet (100 mg total) by mouth daily. 90 tablet 1   No current facility-administered medications for this visit.     Psychiatric Specialty Exam: Review of Systems  All other systems reviewed and are negative.   There were no vitals taken for this visit.There is  no height or weight on file to calculate BMI.  General Appearance: NA  Eye Contact:  NA  Speech:  Clear and Coherent and Normal Rate  Volume:  Normal  Mood:  Some depression.  Affect:  NA  Thought Process:  Goal Directed and Linear  Orientation:  Full (Time, Place, and Person)  Thought Content: Logical   Suicidal Thoughts:  No  Homicidal Thoughts:  No  Memory:  Immediate;   Good Recent;   Good Remote;   Good  Judgement:  Good  Insight:  Good  Psychomotor Activity:  NA  Concentration:  Concentration: Good  Recall:  Good  Fund of Knowledge: Good  Language: Good  Akathisia:  Negative  Handed:  Right  AIMS (if indicated): not done  Assets:  Communication Skills Desire for Improvement Financial Resources/Insurance Housing  Resilience Talents/Skills  ADL's:  Intact  Cognition: WNL  Sleep:  Good   Screenings: PHQ2-9   Flowsheet Row Office Visit from 04/14/2020 in [facility] [place] Office Visit from 03/05/2020 in [facility] Office Visit from 03/02/2019 in [facility] Office Visit from 12/01/2018 in [facility] Office Visit from 12/05/2017 in [facility]   PHQ-2 Total Score 2 2 0 0 0  PHQ-9 Total Score 3 6 1 1  --       Assessment and Plan: 49yo divorcedmale with a hx of depression/anxiety in a setting of marital conflicts. He has become more depressed, anxious, worried, not sleeping well and losing weight (no appetite) after his wife of 22 years decided to split and left their home on August 3rd 2020.They have since divorced. They have 23 year old daughter who independently and 18 yo daughter who is with him every other week (she is dealing with parents' divorce well). 20 has been on sertraline forover two years- dose was increased last year to 100 mg and mirtazapine added. His mood has improved and so have sleep and appetite. He denies having SIandhas no prior hx of SI/attempts,  no hx of inpatient psychiatric admissions. Vincenza Hews has a hx of problems with concentration, task completion, procrastination since middle school. He was formally diagnosed with ADD in 2014 and started on Adderall. Current dose works well and is well tolerated.  Dx: Major depressive disorder recurrentin partial remission; Adult ADD  Plan:Continuesertraline to 100 mg daily,mirtazapine 15 mg at HS for sleep/appetite/moodandAdderall20 mg bid.Next appointment with me in3 months. The plan was discussed with patient who had an opportunity to ask questions and these were all answered. I spend68minutes inphone consultationwith the patient.   12m, MD 09/19/2020, 11:42 AM

## 2020-12-12 ENCOUNTER — Other Ambulatory Visit: Payer: Self-pay

## 2020-12-12 ENCOUNTER — Encounter (HOSPITAL_COMMUNITY): Payer: Self-pay | Admitting: Psychiatry

## 2020-12-12 ENCOUNTER — Other Ambulatory Visit (HOSPITAL_COMMUNITY): Payer: Self-pay | Admitting: Psychiatry

## 2020-12-12 ENCOUNTER — Telehealth (INDEPENDENT_AMBULATORY_CARE_PROVIDER_SITE_OTHER): Payer: Self-pay | Admitting: Psychiatry

## 2020-12-12 DIAGNOSIS — F3342 Major depressive disorder, recurrent, in full remission: Secondary | ICD-10-CM

## 2020-12-12 DIAGNOSIS — F900 Attention-deficit hyperactivity disorder, predominantly inattentive type: Secondary | ICD-10-CM

## 2020-12-12 MED ORDER — MIRTAZAPINE 15 MG PO TABS
15.0000 mg | ORAL_TABLET | Freq: Every day | ORAL | 1 refills | Status: DC
Start: 2020-12-12 — End: 2020-12-12

## 2020-12-12 MED ORDER — AMPHETAMINE-DEXTROAMPHETAMINE 20 MG PO TABS: 20.0000 mg | ORAL_TABLET | Freq: Two times a day (BID) | ORAL | 0 refills | Status: DC

## 2020-12-12 MED ORDER — SERTRALINE HCL 100 MG PO TABS: 100.0000 mg | ORAL_TABLET | Freq: Every day | ORAL | 1 refills | Status: DC

## 2020-12-12 NOTE — Progress Notes (Signed)
BH MD/PA/NP OP Progress Note  12/12/2020 11:43 AM Shane [name]  MRN:  [id number] Interview was conducted by phone and I verified that I was speaking with the correct person using two identifiers. I discussed the limitations of evaluation and management by telemedicine and  the availability of in person appointments. Patient expressed understanding and agreed to proceed. Participants in the visit: patient (location - home); physician (location - home office).  Chief Complaint: None.  HPI: 50yo divorcedmale with a hx of depression/anxiety in a setting of marital conflicts. He has become more depressed, anxious, worried, not sleeping well and losing weight (no appetite) after his wife of 22 years [name] cided to split and left their home on August 3rd 2020.They have since divorced.  Shane [name] has been on sertraline forover two years- dose was increased last year to 100 mg and mirtazapine added. His mood has improved and so have sleep and appetite. He denies having SIandhas no prior hx of SI/attempts, no hx of inpatient psychiatric admissions. Shane [name] has a hx of problems with concentration, task completion, procrastination since middle school. He was formally diagnosed with ADD in 2014 and started on Adderall.Current dose works well and is well tolerated.    Visit Diagnosis:    ICD-10-CM   1. Attention deficit hyperactivity disorder (ADHD), predominantly inattentive type  F90.0   2. Recurrent major depressive disorder, in full remission (HCC)  F33.42 sertraline (ZOLOFT) 100 MG tablet    Past Psychiatric History: Please see intake H&P.  Past Medical History:  Past Medical History:  Diagnosis Date  . ADD (attention deficit disorder)   . Alcohol abuse    History of.  . Anxiety   . Depression   . Hyperlipidemia   . Hypertension     Past Surgical History:  Procedure Laterality Date  . TENDON REPAIR Right 09/15/2015   Procedure: RIGHT FOREARM EXPLORATION AND CLOSURE OF LACERATION,  TENDON REPAIR;  Surgeon: Bradly Bienenstock, MD;  Location: [facility] OR;  Service: Orthopedics;  Laterality: Right;    Family Psychiatric History: Reviewed.  Family History:  Family History  Problem Relation Age of Onset  . Prostate cancer Father   . Hyperlipidemia Father   . Hypertension Father   . Lung cancer Maternal Grandfather   . Heart attack Maternal Grandfather 66       died  . Alcohol abuse Paternal Grandfather   . Hypertension Cousin   . Hyperlipidemia Other   . Heart attack Paternal Uncle 52       died  . Heart attack Maternal Great-grandfather 71  . Alcohol abuse Maternal Aunt     Social History:  Social History   Socioeconomic History  . Marital status: Divorced    Spouse name: [name]  . Number of children: 2  . Years of education: 40  . Highest education level: Not on file  Occupational History    Comment: [employer]  Tobacco Use  . Smoking status: Current Every Day Smoker    Packs/day: 0.25  . Smokeless tobacco: Never Used  Substance and Sexual Activity  . Alcohol use: No    Comment: no alcohol since 12/16/2012  . Drug use: No  . Sexual activity: Yes    Partners: Female  Other Topics Concern  . Not on file  Social History Narrative   Divorced in 2021; they have  two daughters (32 and 80 )    Rebuilds induction coils for work   Former smoker x 5-6 years 2 packs per week quit  Social Determinants of Health   Financial Resource Strain: Not on file  Food Insecurity: Not on file  Transportation Needs: Not on file  Physical Activity: Not on file  Stress: Not on file  Social Connections: Not on file    Allergies: No Known Allergies  Metabolic Disorder Labs: Lab Results  Component Value Date   HGBA1C 5.9 03/05/2020   No results found for: PROLACTIN Lab Results  Component Value Date   CHOL 242 (H) 03/05/2020   TRIG 136.0 03/05/2020   HDL 50.10 03/05/2020   CHOLHDL 5 03/05/2020   VLDL 27.2 03/05/2020   LDLCALC 164 (H) 03/05/2020    LDLCALC 150 (H) 12/05/2017   Lab Results  Component Value Date   TSH 2.44 03/05/2020   TSH 2.15 12/05/2017    Therapeutic Level Labs: No results found for: LITHIUM No results found for: VALPROATE No components found for:  CBMZ  Current Medications: Current Outpatient Medications  Medication Sig Dispense Refill  . [START ON 01/12/2021] amphetamine-dextroamphetamine (ADDERALL) 20 MG tablet Take 1 tablet (20 mg total) by mouth 2 (two) times daily. 60 tablet 0  . [START ON 02/10/2021] amphetamine-dextroamphetamine (ADDERALL) 20 MG tablet Take 1 tablet (20 mg total) by mouth 2 (two) times daily. 60 tablet 0  . [START ON 04/12/2021] amphetamine-dextroamphetamine (ADDERALL) 20 MG tablet Take 1 tablet (20 mg total) by mouth 2 (two) times daily. 60 tablet 0  . amLODipine (NORVASC) 2.5 MG tablet Take 1 tablet (2.5 mg total) by mouth daily. 90 tablet 2  . mirtazapine (REMERON) 15 MG tablet Take 1 tablet (15 mg total) by mouth at bedtime. 90 tablet 1  . mupirocin ointment (BACTROBAN) 2 % Apply 1 application topically 2 (two) times daily. 30 g 2  . nicotine (NICODERM CQ - DOSED IN MG/24 HOURS) 14 mg/24hr patch Place 1 patch (14 mg total) onto the skin daily. (Patient not taking: Reported on 06/04/2020) 30 patch 5  . pravastatin (PRAVACHOL) 20 MG tablet Take 1 tablet (20 mg total) by mouth daily. 90 tablet 3  . sertraline (ZOLOFT) 100 MG tablet Take 1 tablet (100 mg total) by mouth daily. 90 tablet 1   No current facility-administered medications for this visit.      Psychiatric Specialty Exam: Review of Systems  All other systems reviewed and are negative.   There were no vitals taken for this visit.There is no height or weight on file to calculate BMI.  General Appearance: NA  Eye Contact:  NA  Speech:  Clear and Coherent and Normal Rate  Volume:  Normal  Mood:  Euthymic  Affect:  NA  Thought Process:  Goal Directed and Linear  Orientation:  Full (Time, Place, and Person)  Thought  Content: Logical   Suicidal Thoughts:  No  Homicidal Thoughts:  No  Memory:  Immediate;   Good Recent;   Good Remote;   Good  Judgement:  Good  Insight:  Good  Psychomotor Activity:  NA  Concentration:  Concentration: Good  Recall:  Good  Fund of Knowledge: Good  Language: Good  Akathisia:  Negative  Handed:  Right  AIMS (if indicated): not done  Assets:  Communication Skills Desire for Improvement Financial Resources/Insurance Housing Talents/Skills  ADL's:  Intact  Cognition: WNL  Sleep:  Good   Screenings: PHQ2-9   Flowsheet Row Video Visit from 12/12/2020 in [facility] Office Visit from 04/14/2020 in [facility] Office Visit from 03/05/2020 in [facility] [place] Office Visit from 03/02/2019  in [facility] Office Visit from 12/01/2018 in [facility] [place]  PHQ-2 Total Score 0 2 2 0 0  PHQ-9 Total Score - 3 6 1 1     Flowsheet Row ED from 07/03/2019 in [facility]  C-SSRS RISK CATEGORY Low Risk       Assessment and Plan: 49yo divorcedmale with a hx of depression/anxiety in a setting of marital conflicts. He has become more depressed, anxious, worried, not sleeping well and losing weight (no appetite) after his wife of 22 years [name] cided to split and left their home on August 3rd 2020.They have since divorced.  08[birth year]30[birth year]2003 has been on sertraline forover two years- dose was increased last year to 100 mg and mirtazapine added. His mood has improved and so have sleep and appetite. He denies having SIandhas no prior hx of SI/attempts, no hx of inpatient psychiatric admissions. Shane [name] has a hx of problems with concentration, task completion, procrastination since middle school. He was formally diagnosed with ADD in 2014 and started on Adderall.Current dose works well and is well tolerated.  Dx: Major depressive disorder recurrent in  remission; Adult ADD  Plan:Continuesertraline to 100 mg daily,mirtazapine 15 mg at HS for sleep/appetite/moodandAdderall20 mg bid.Next appointment in3 months with a new provider. The plan was discussed with patient who had an opportunity to ask questions and these were all answered. I spend15minutes inphone consultationwith the patient.   12m, MD 12/12/2020, 11:43 AM

## 2021-01-05 ENCOUNTER — Other Ambulatory Visit: Payer: Self-pay

## 2021-01-05 MED FILL — Mirtazapine Tab 15 MG: ORAL | 90 days supply | Qty: 90 | Fill #0 | Status: AC

## 2021-01-05 MED FILL — Pravastatin Sodium Tab 20 MG: ORAL | 90 days supply | Qty: 90 | Fill #0 | Status: AC

## 2021-01-05 MED FILL — Amlodipine Besylate Tab 2.5 MG (Base Equivalent): ORAL | 90 days supply | Qty: 90 | Fill #0 | Status: AC

## 2021-01-06 ENCOUNTER — Other Ambulatory Visit: Payer: Self-pay

## 2021-01-06 IMAGING — CR CHEST - 2 VIEW
1 series · 2 of 2 positions shown · non-contrast
Comparison: None.

CLINICAL DATA: Intermittent palpitations and chest pressure over
the last 3 days.

EXAM:
CHEST - 2 VIEW

[Series 1: dg chest 2 view · 0.14mm/px · 2 of 2 slices shown]
[im 1/2]
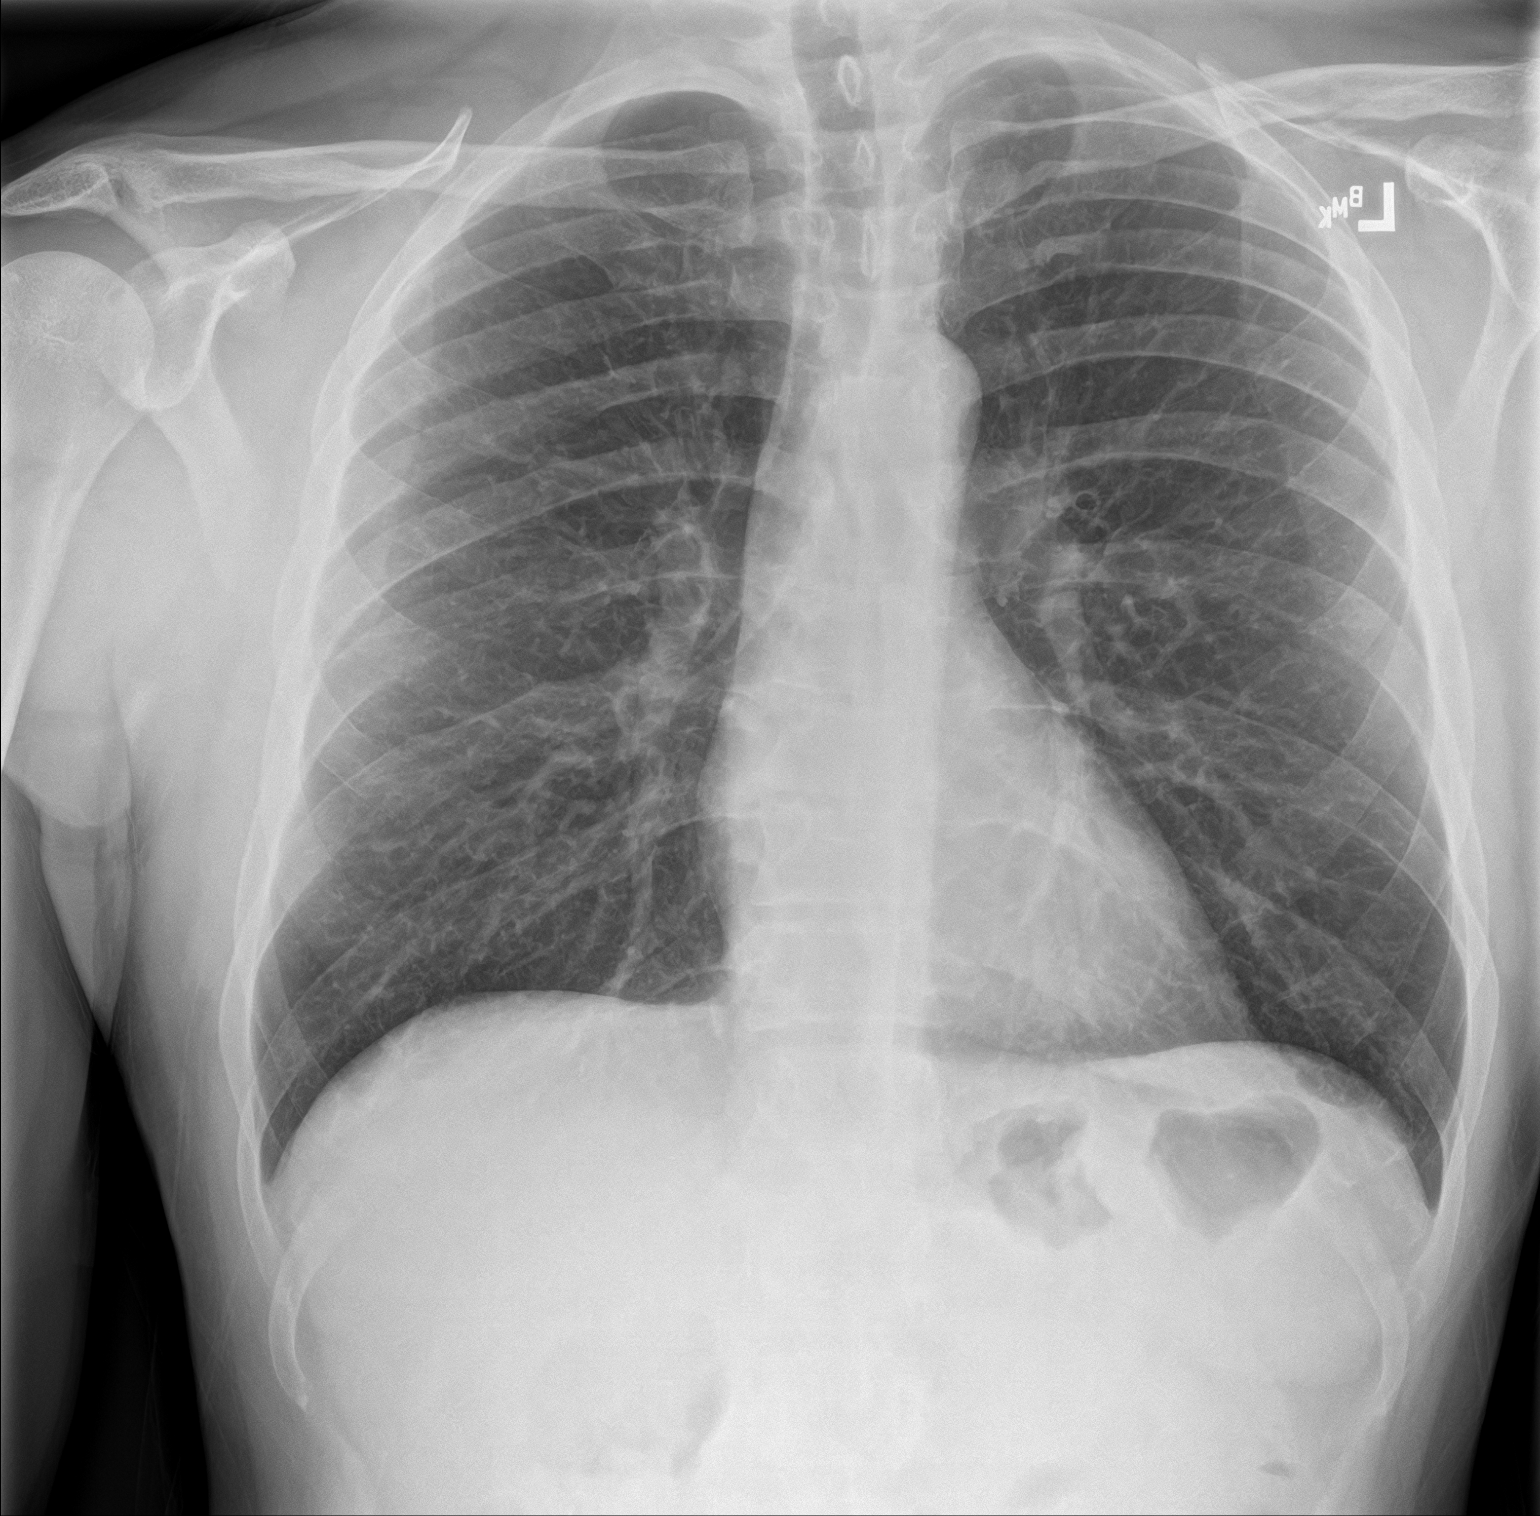
[im 2/2]
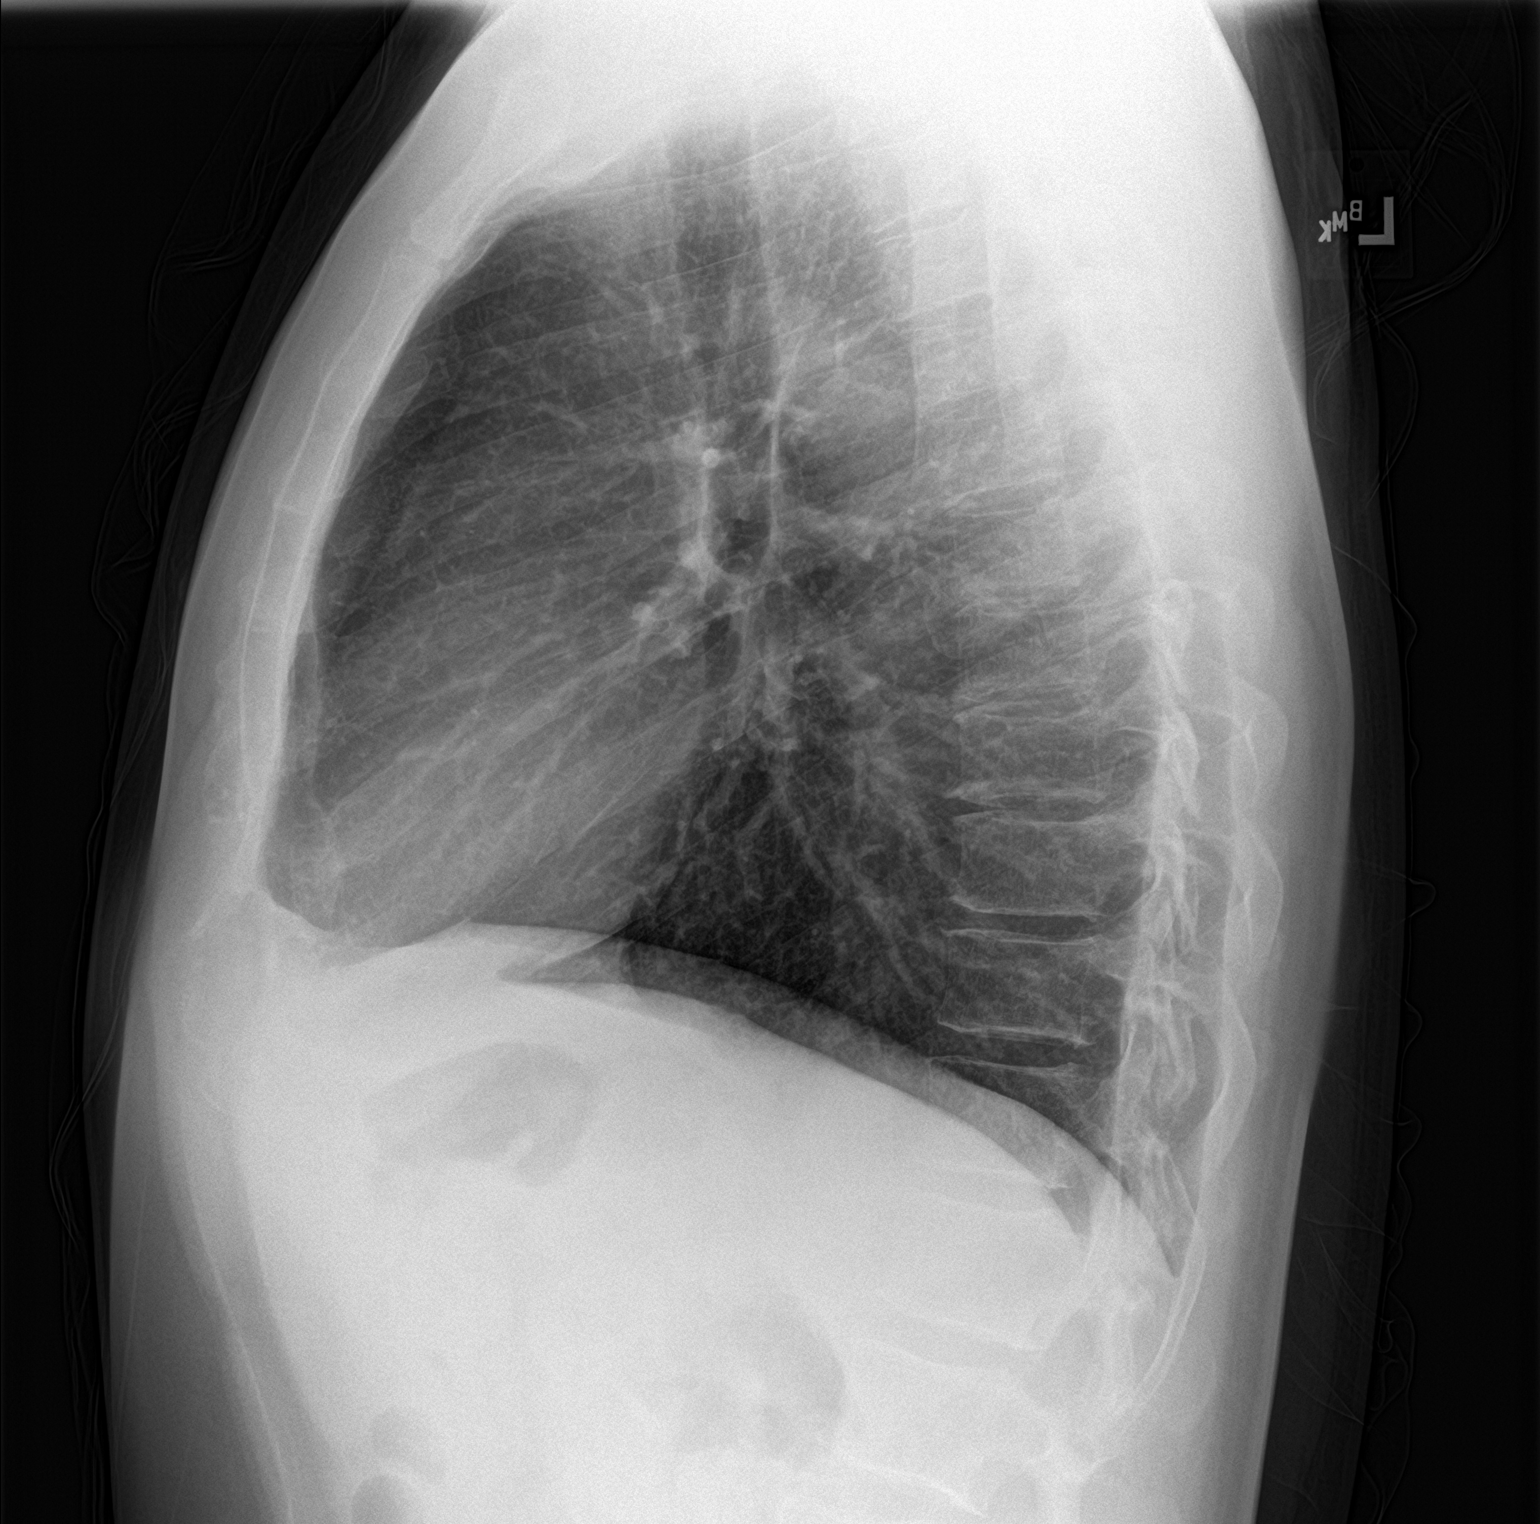

[2 of 2 positions shown; findings below may reference images not displayed]

FINDINGS: Heart size is normal. Mediastinal shadows are normal. The lungs are
clear. No bronchial thickening. No infiltrate, mass, effusion or
collapse. Pulmonary vascularity is normal. No bony abnormality.
IMPRESSION: Normal chest

## 2021-01-07 ENCOUNTER — Other Ambulatory Visit: Payer: Self-pay

## 2021-01-22 ENCOUNTER — Other Ambulatory Visit: Payer: Self-pay

## 2021-01-22 MED FILL — Amphetamine-Dextroamphetamine Tab 20 MG: ORAL | 30 days supply | Qty: 60 | Fill #0 | Status: AC

## 2021-01-23 ENCOUNTER — Other Ambulatory Visit: Payer: Self-pay

## 2021-01-30 ENCOUNTER — Other Ambulatory Visit: Payer: Self-pay

## 2021-01-30 MED FILL — Sertraline HCl Tab 100 MG: ORAL | 90 days supply | Qty: 90 | Fill #0 | Status: AC

## 2021-02-26 ENCOUNTER — Other Ambulatory Visit: Payer: Self-pay

## 2021-02-26 MED FILL — Amphetamine-Dextroamphetamine Tab 20 MG: ORAL | 30 days supply | Qty: 60 | Fill #0 | Status: AC

## 2021-04-13 ENCOUNTER — Other Ambulatory Visit: Payer: Self-pay

## 2021-04-13 MED FILL — Amphetamine-Dextroamphetamine Tab 20 MG: ORAL | 30 days supply | Qty: 60 | Fill #0 | Status: AC

## 2021-04-13 MED FILL — Mirtazapine Tab 15 MG: ORAL | 90 days supply | Qty: 90 | Fill #1 | Status: AC

## 2021-05-22 ENCOUNTER — Other Ambulatory Visit: Payer: Self-pay

## 2021-05-22 ENCOUNTER — Other Ambulatory Visit (HOSPITAL_COMMUNITY): Payer: Self-pay | Admitting: Family

## 2021-05-22 ENCOUNTER — Encounter: Payer: Self-pay | Admitting: Family

## 2021-05-22 MED FILL — Sertraline HCl Tab 100 MG: ORAL | 90 days supply | Qty: 90 | Fill #1 | Status: AC

## 2021-05-25 ENCOUNTER — Other Ambulatory Visit: Payer: Self-pay

## 2021-05-26 NOTE — Telephone Encounter (Signed)
Pt called back and will be calling office he got testing done at to fax over results.

## 2021-05-26 NOTE — Telephone Encounter (Signed)
LMTCB

## 2021-05-29 NOTE — Telephone Encounter (Signed)
Noted  

## 2021-05-29 NOTE — Telephone Encounter (Signed)
Patient came in today and signed at records release form.Form faxed over to [facility] and fax confirmation received.

## 2021-06-11 ENCOUNTER — Telehealth: Payer: Self-pay

## 2021-06-11 DIAGNOSIS — F900 Attention-deficit hyperactivity disorder, predominantly inattentive type: Secondary | ICD-10-CM

## 2021-06-11 NOTE — Telephone Encounter (Signed)
Pt was scheduled the 16th for his adderall refill. He will need refill before new appointment. Will you be willing to refill for him?

## 2021-06-12 ENCOUNTER — Other Ambulatory Visit: Payer: Self-pay

## 2021-06-12 NOTE — Telephone Encounter (Signed)
LMTCB to see if patient can come in today to leave urine for drug screen so that Claris Che may prescribe Adderall prior to visit.

## 2021-06-12 NOTE — Telephone Encounter (Signed)
Call pt  I can prescribe adderall and we can discuss in detail when he is here.  He may still need formal testing as there are notes regarding formal testing from psychiatry records which were sent  He will need to leave urine drug screen prior to my prescribing adderall. This is an annual test  Please fil out controlled sub contract as well and have him sign when he comes in for urine

## 2021-06-12 NOTE — Addendum Note (Signed)
Addended by: Allegra Grana on: 06/12/2021 08:14 AM   Modules accepted: Orders

## 2021-06-17 NOTE — Telephone Encounter (Signed)
LMTCB

## 2021-06-19 ENCOUNTER — Ambulatory Visit: Payer: Self-pay | Admitting: Family

## 2021-07-03 ENCOUNTER — Other Ambulatory Visit: Payer: Self-pay

## 2021-07-03 ENCOUNTER — Encounter: Payer: Self-pay | Admitting: Family

## 2021-07-03 ENCOUNTER — Ambulatory Visit (INDEPENDENT_AMBULATORY_CARE_PROVIDER_SITE_OTHER): Payer: Self-pay | Admitting: Family

## 2021-07-03 VITALS — BP 110/82 | HR 62 | Temp 98.0°F | Ht 73.0 in | Wt 208.2 lb

## 2021-07-03 DIAGNOSIS — F900 Attention-deficit hyperactivity disorder, predominantly inattentive type: Secondary | ICD-10-CM

## 2021-07-03 DIAGNOSIS — F3342 Major depressive disorder, recurrent, in full remission: Secondary | ICD-10-CM

## 2021-07-03 DIAGNOSIS — Z720 Tobacco use: Secondary | ICD-10-CM

## 2021-07-03 MED ORDER — AMPHETAMINE-DEXTROAMPHETAMINE 20 MG PO TABS
20.0000 mg | ORAL_TABLET | Freq: Two times a day (BID) | ORAL | 0 refills | Status: DC
  Filled 2021-07-03 – 2021-08-07 (×2): qty 60, 30d supply, fill #0

## 2021-07-03 MED ORDER — MIRTAZAPINE 15 MG PO TABS
ORAL_TABLET | Freq: Every day | ORAL | 2 refills | Status: DC
  Filled 2021-07-03 – 2021-07-14 (×2): qty 90, 90d supply, fill #0
  Filled 2021-10-16: qty 90, 90d supply, fill #1
  Filled 2022-01-18: qty 90, 90d supply, fill #2

## 2021-07-03 MED ORDER — AMPHETAMINE-DEXTROAMPHETAMINE 20 MG PO TABS
20.0000 mg | ORAL_TABLET | Freq: Two times a day (BID) | ORAL | 0 refills | Status: DC
Start: 2021-07-03 — End: 2021-10-28
  Filled 2021-07-03 – 2021-09-17 (×2): qty 60, 30d supply, fill #0

## 2021-07-03 MED ORDER — AMPHETAMINE-DEXTROAMPHETAMINE 20 MG PO TABS
20.0000 mg | ORAL_TABLET | Freq: Two times a day (BID) | ORAL | 0 refills | Status: DC
Start: 2021-07-03 — End: 2021-10-28
  Filled 2021-07-03: qty 60, 30d supply, fill #0

## 2021-07-03 MED ORDER — SERTRALINE HCL 100 MG PO TABS
ORAL_TABLET | Freq: Every day | ORAL | 2 refills | Status: DC
  Filled 2021-07-03 – 2021-09-03 (×2): qty 90, 90d supply, fill #0
  Filled 2021-12-14: qty 90, 90d supply, fill #1

## 2021-07-03 NOTE — Assessment & Plan Note (Signed)
Discussed trial of nicotine patch when he is ready. He declines today.

## 2021-07-03 NOTE — Patient Instructions (Signed)
Please resume Adderall 20 mg.  I have sent in Zoloft, mirtazapine as well.  Very nice to see you today, please let me know if you need anything at all.

## 2021-07-03 NOTE — Assessment & Plan Note (Signed)
Symptoms improved on adderall. Discussed storing in safe place. Formally diagnosed 2013, Dr Merla Riches; psychologist Karmen Bongo. Pending Urine drug screen ( lab closed today due to weather). CSC signed. Refilled Adderall 20mg  BID. Patient will maintain appointments every 3 months.  I looked up patient on [place] Controlled Substances Reporting System PMP AWARE and saw no activity that raised concern of inappropriate use.

## 2021-07-03 NOTE — Assessment & Plan Note (Signed)
Chronic , stable. Continue zoloft 100mg , mirtizapine 15mg 

## 2021-07-03 NOTE — Progress Notes (Signed)
Subjective:    Patient ID: Shane [name], male    DOB: [birth year], 50 y.o.   MRN: [id number]  CC: Shane [name] is a 50 y.o. male who presents today for follow up.   HPI: Here today to discuss ADD  He has been off adderall for 4 weeks. He would like to resume today.  Trouble staying on task, focusing.  Adderall has been helpful to stay focus on projects.  Anxiety improves on adderall. Sleeping well on mirtazapine.  Zoloft 100mg  helpful. He feels he is in a better place. No si/hi.   Divorced from wife as of 08/2020  Last visit with Dr 09/2020 12/2020 whom has since left the practice.  He has been on sertraline 100 mg, mirtazapine 15 mg at bedtime, Adderall 20 mg twice daily Per chart, formally diagnosed with ADD in 2014 by 2015 licensed psychological associates. Letter provided from him to our office recommending for him to continue stimulant medication.   No heavy alcohol use since 12/2012. Very occasional alcohol use. He feels in control  Smoker. He is cutting back smoking however doesn't want to start medication for cessation at this time.    HISTORY:  Past Medical History:  Diagnosis Date   ADD (attention deficit disorder)    Alcohol abuse    History of.   Anxiety    Depression    Hyperlipidemia    Hypertension    Past Surgical History:  Procedure Laterality Date   TENDON REPAIR Right 09/15/2015   Procedure: RIGHT FOREARM EXPLORATION AND CLOSURE OF LACERATION, TENDON REPAIR;  Surgeon: 14/09/2015, MD;  Location: [facility] OR;  Service: Orthopedics;  Laterality: Right;   Family History  Problem Relation Age of Onset   Prostate cancer Father    Hyperlipidemia Father    Hypertension Father    Lung cancer Maternal Grandfather    Heart attack Maternal Grandfather 22       died   Alcohol abuse Paternal Grandfather    Hypertension Cousin    Hyperlipidemia Other    Heart attack Paternal Uncle 17       died   Heart attack Maternal Great-grandfather 64    Alcohol abuse Maternal Aunt     Allergies: Patient has no known allergies. Current Outpatient Medications on File Prior to Visit  Medication Sig Dispense Refill   mupirocin ointment (BACTROBAN) 2 % Apply 1 application topically 2 (two) times daily. 30 g 2   nicotine (NICODERM CQ - DOSED IN MG/24 HOURS) 14 mg/24hr patch Place 1 patch (14 mg total) onto the skin daily. (Patient not taking: No sig reported) 30 patch 5   No current facility-administered medications on file prior to visit.    Social History   Tobacco Use   Smoking status: Every Day    Packs/day: 0.25    Types: Cigarettes   Smokeless tobacco: Never  Substance Use Topics   Alcohol use: No    Comment: no alcohol since 12/16/2012   Drug use: No    Review of Systems  Constitutional:  Negative for chills and fever.  Respiratory:  Negative for cough.   Cardiovascular:  Negative for chest pain and palpitations.  Gastrointestinal:  Negative for nausea and vomiting.  Psychiatric/Behavioral:  Negative for decreased concentration, sleep disturbance and suicidal ideas. The patient is nervous/anxious.      Objective:    BP 110/82 (BP Location: Left Arm, Patient Position: Sitting, Cuff Size: Large)   Pulse 62   Temp 98 F (36.7 C) (  Oral)   Ht 6\' 1"  (1.854 m)   Wt 208 lb 3.2 oz (94.4 kg)   SpO2 99%   BMI 27.47 kg/m  BP Readings from Last 3 Encounters:  07/03/21 110/82  04/14/20 (!) 138/96  03/05/20 124/82   Wt Readings from Last 3 Encounters:  07/03/21 208 lb 3.2 oz (94.4 kg)  06/04/20 210 lb (95.3 kg)  04/14/20 210 lb (95.3 kg)    Physical Exam Vitals reviewed.  Constitutional:      Appearance: He is well-developed.  Cardiovascular:     Rate and Rhythm: Regular rhythm.     Heart sounds: Normal heart sounds.  Pulmonary:     Effort: Pulmonary effort is normal. No respiratory distress.     Breath sounds: Normal breath sounds. No wheezing, rhonchi or rales.  Skin:    General: Skin is warm and dry.   Neurological:     Mental Status: He is alert.  Psychiatric:        Speech: Speech normal.        Behavior: Behavior normal.       Assessment & Plan:   Problem List Items Addressed This Visit       Other   ADD (attention deficit disorder) - Primary    Symptoms improved on adderall. Discussed storing in safe place. Formally diagnosed 2013, Dr 2014; psychologist Merla Riches. Pending Urine drug screen ( lab closed today due to weather). CSC signed. Refilled Adderall 20mg  BID. Patient will maintain appointments every 3 months.  I looked up patient on  Controlled Substances Reporting System PMP AWARE and saw no activity that raised concern of inappropriate use.        Relevant Medications   amphetamine-dextroamphetamine (ADDERALL) 20 MG tablet   amphetamine-dextroamphetamine (ADDERALL) 20 MG tablet   amphetamine-dextroamphetamine (ADDERALL) 20 MG tablet   Recurrent major depressive disorder, in full remission (HCC)    Chronic , stable. Continue zoloft 100mg , mirtizapine 15mg       Relevant Medications   sertraline (ZOLOFT) 100 MG tablet   mirtazapine (REMERON) 15 MG tablet   Tobacco abuse    Discussed trial of nicotine patch when he is ready. He declines today.       Self pay. Declines colonoscopy at this time.   I have discontinued Karmen Bongo "Shane"'s amLODipine and pravastatin. I am also having him maintain his nicotine, mupirocin ointment, sertraline, mirtazapine, amphetamine-dextroamphetamine, amphetamine-dextroamphetamine, and amphetamine-dextroamphetamine.   Meds ordered this encounter  Medications   sertraline (ZOLOFT) 100 MG tablet    Sig: TAKE 1 TABLET BY MOUTH DAILY    Dispense:  90 tablet    Refill:  2    Order Specific Question:   Supervising Provider    Answer:   L [[id number]]   mirtazapine (REMERON) 15 MG tablet    Sig: TAKE 1 TABLET BY MOUTH AT BEDTIME.    Dispense:  90 tablet    Refill:  2    Order Specific Question:    Supervising Provider    Answer:   L [[id number]]   amphetamine-dextroamphetamine (ADDERALL) 20 MG tablet    Sig: Take 1 tablet (20 mg total) by mouth 2 (two) times daily.    Dispense:  60 tablet    Refill:  0    DNF 09/02/21    Order Specific Question:   Supervising Provider    Answer:   [name] [[id number]]   amphetamine-dextroamphetamine (ADDERALL) 20 MG tablet    Sig: Take 1 tablet (20 mg  total) by mouth 2 (two) times daily.    Dispense:  60 tablet    Refill:  0    DNF 08/02/21    Order Specific Question:   Supervising Provider    Answer:   Sherlene Shams [[id number]]   amphetamine-dextroamphetamine (ADDERALL) 20 MG tablet    Sig: Take 1 tablet (20 mg total) by mouth 2 (two) times daily.    Dispense:  60 tablet    Refill:  0    Order Specific Question:   Supervising Provider    Answer:   Sherlene Shams [[id number]]    Return precautions given.   Risks, benefits, and alternatives of the medications and treatment plan prescribed today were discussed, and patient expressed understanding.   Education regarding symptom management and diagnosis given to patient on AVS.  Continue to follow with Allegra Grana, FNP for routine health maintenance.   [name] and I agreed with plan.   Rennie Plowman, FNP

## 2021-07-08 ENCOUNTER — Other Ambulatory Visit: Payer: Self-pay

## 2021-07-10 NOTE — Progress Notes (Signed)
I was unable to reach anyone at [facility] where patient was referred. I was able to leave message asking that someone call me back to advise.

## 2021-07-14 ENCOUNTER — Other Ambulatory Visit: Payer: Self-pay

## 2021-08-07 ENCOUNTER — Other Ambulatory Visit: Payer: Self-pay

## 2021-09-03 ENCOUNTER — Other Ambulatory Visit: Payer: Self-pay

## 2021-09-09 ENCOUNTER — Encounter: Payer: Self-pay | Admitting: Family

## 2021-09-17 ENCOUNTER — Other Ambulatory Visit: Payer: Self-pay

## 2021-10-02 ENCOUNTER — Other Ambulatory Visit: Payer: Self-pay

## 2021-10-02 ENCOUNTER — Ambulatory Visit (INDEPENDENT_AMBULATORY_CARE_PROVIDER_SITE_OTHER): Payer: Self-pay | Admitting: Family

## 2021-10-02 VITALS — BP 115/82 | HR 97 | Temp 98.1°F | Ht 73.0 in | Wt 210.6 lb

## 2021-10-02 DIAGNOSIS — Z720 Tobacco use: Secondary | ICD-10-CM

## 2021-10-02 DIAGNOSIS — F900 Attention-deficit hyperactivity disorder, predominantly inattentive type: Secondary | ICD-10-CM

## 2021-10-02 DIAGNOSIS — Z1211 Encounter for screening for malignant neoplasm of colon: Secondary | ICD-10-CM

## 2021-10-02 DIAGNOSIS — F3342 Major depressive disorder, recurrent, in full remission: Secondary | ICD-10-CM

## 2021-10-02 LAB — CBC WITH DIFFERENTIAL/PLATELET
Basophils Absolute: 0 10*3/uL (ref 0.0–0.1)
Basophils Relative: 0.5 % (ref 0.0–3.0)
Eosinophils Absolute: 0.1 10*3/uL (ref 0.0–0.7)
Eosinophils Relative: 2.2 % (ref 0.0–5.0)
HCT: 51.6 % (ref 39.0–52.0)
Hemoglobin: 17.3 g/dL — ABNORMAL HIGH (ref 13.0–17.0)
Lymphocytes Relative: 18.5 % (ref 12.0–46.0)
Lymphs Abs: 1.1 10*3/uL (ref 0.7–4.0)
MCHC: 33.6 g/dL (ref 30.0–36.0)
MCV: 98.3 fl (ref 78.0–100.0)
Monocytes Absolute: 0.5 10*3/uL (ref 0.1–1.0)
Monocytes Relative: 8.1 % (ref 3.0–12.0)
Neutro Abs: 4.3 10*3/uL (ref 1.4–7.7)
Neutrophils Relative %: 70.7 % (ref 43.0–77.0)
Platelets: 260 10*3/uL (ref 150.0–400.0)
RBC: 5.25 Mil/uL (ref 4.22–5.81)
RDW: 13.3 % (ref 11.5–15.5)
WBC: 6 10*3/uL (ref 4.0–10.5)

## 2021-10-02 LAB — LIPID PANEL
Cholesterol: 208 mg/dL — ABNORMAL HIGH (ref 0–200)
HDL: 43.2 mg/dL (ref >39.00)
LDL Cholesterol: 136 mg/dL — ABNORMAL HIGH (ref 0–99)
NonHDL: 164.85
Total CHOL/HDL Ratio: 5
Triglycerides: 146 mg/dL (ref 0.0–149.0)
VLDL: 29.2 mg/dL (ref 0.0–40.0)

## 2021-10-02 LAB — TSH: TSH: 2.41 u[IU]/mL (ref 0.35–5.50)

## 2021-10-02 LAB — COMPREHENSIVE METABOLIC PANEL
ALT: 59 U/L — ABNORMAL HIGH (ref 0–53)
AST: 43 U/L — ABNORMAL HIGH (ref 0–37)
Albumin: 4.1 g/dL (ref 3.5–5.2)
Alkaline Phosphatase: 113 U/L (ref 39–117)
BUN: 12 mg/dL (ref 6–23)
CO2: 26 mEq/L (ref 19–32)
Calcium: 9.5 mg/dL (ref 8.4–10.5)
Chloride: 99 mEq/L (ref 96–112)
Creatinine, Ser: 0.95 mg/dL (ref 0.40–1.50)
GFR: 94.49 mL/min (ref >60.00)
Glucose, Bld: 108 mg/dL — ABNORMAL HIGH (ref 70–99)
Potassium: 4.1 mEq/L (ref 3.5–5.1)
Sodium: 134 mEq/L — ABNORMAL LOW (ref 135–145)
Total Bilirubin: 0.9 mg/dL (ref 0.2–1.2)
Total Protein: 7.2 g/dL (ref 6.0–8.3)

## 2021-10-02 LAB — HEMOGLOBIN A1C: Hgb A1c MFr Bld: 6.2 % (ref 4.6–6.5)

## 2021-10-02 LAB — VITAMIN D 25 HYDROXY (VIT D DEFICIENCY, FRACTURES): VITD: 12.2 ng/mL — ABNORMAL LOW (ref 30.00–100.00)

## 2021-10-02 LAB — PSA: PSA: 1.92 ng/mL (ref 0.10–4.00)

## 2021-10-02 MED ORDER — NICOTINE 14 MG/24HR TD PT24
14.0000 mg | MEDICATED_PATCH | Freq: Every day | TRANSDERMAL | 5 refills | Status: DC
  Filled 2021-10-02 – 2022-04-02 (×2): qty 28, 28d supply, fill #0

## 2021-10-02 NOTE — Assessment & Plan Note (Signed)
Chronic, stable to improved. Continue Zoloft 100 mg, Remeron 15 mg

## 2021-10-02 NOTE — Patient Instructions (Signed)
Referral for colonoscopy Let us know if you dont hear back within a week in regards to an appointment being scheduled.   Nice to see you!  

## 2021-10-02 NOTE — Assessment & Plan Note (Signed)
Congratulated patient on desire for smoking cessation.  I have refilled nicotine patch

## 2021-10-02 NOTE — Progress Notes (Signed)
Subjective:    Patient ID: Shane [name], male    DOB: 03/26/1971, 50 y.o.   MRN: [id number]  CC: Shane [name] is a 50 y.o. male who presents today for follow up.   HPI: Feels well today. No new complaints.  Feels more at ease. Depression and anxiety have improved. He is sleeping well.  No hi/si.   Compliant with zoloft 100mg  and feels well on dose. He doesn't want to decrease dose at this time.  He is compliant with Remeron 15mg   He was able to quit smoking prior to holiday by using nicotine patch and he resumed at the holidays. he would like a refill of this as it was very helpful.  ADD- adderall 20 mg BID which is working well for him.  No increased anxiety, palpitations   HISTORY:  Past Medical History:  Diagnosis Date   ADD (attention deficit disorder)    Alcohol abuse    History of.   Anxiety    Depression    Hyperlipidemia    Hypertension    Past Surgical History:  Procedure Laterality Date   TENDON REPAIR Right 09/15/2015   Procedure: RIGHT FOREARM EXPLORATION AND CLOSURE OF LACERATION, TENDON REPAIR;  Surgeon: , MD;  Location: [facility] OR;  Service: Orthopedics;  Laterality: Right;   Family History  Problem Relation Age of Onset   Prostate cancer Father    Hyperlipidemia Father    Hypertension Father    Lung cancer Maternal Grandfather    Heart attack Maternal Grandfather 31       died   Alcohol abuse Paternal Grandfather    Hypertension Cousin    Hyperlipidemia Other    Heart attack Paternal Uncle 57       died   Heart attack Maternal Great-grandfather 73   Alcohol abuse Maternal Aunt     Allergies: Patient has no known allergies. Current Outpatient Medications on File Prior to Visit  Medication Sig Dispense Refill   amphetamine-dextroamphetamine (ADDERALL) 20 MG tablet Take 1 tablet (20 mg total) by mouth 2 (two) times daily. 60 tablet 0   mirtazapine (REMERON) 15 MG tablet TAKE 1 TABLET BY MOUTH AT BEDTIME. 90 tablet 2    sertraline (ZOLOFT) 100 MG tablet TAKE 1 TABLET BY MOUTH DAILY 90 tablet 2   amphetamine-dextroamphetamine (ADDERALL) 20 MG tablet Take 1 tablet (20 mg total) by mouth 2 (two) times daily. 60 tablet 0   amphetamine-dextroamphetamine (ADDERALL) 20 MG tablet Take 1 tablet (20 mg total) by mouth 2 (two) times daily. 60 tablet 0   mupirocin ointment (BACTROBAN) 2 % Apply 1 application topically 2 (two) times daily. (Patient not taking: Reported on 10/02/2021) 30 g 2   No current facility-administered medications on file prior to visit.    Social History   Tobacco Use   Smoking status: Every Day    Packs/day: 0.25    Types: Cigarettes   Smokeless tobacco: Never  Substance Use Topics   Alcohol use: No    Comment: no alcohol since 12/16/2012   Drug use: No    Review of Systems  Constitutional:  Negative for chills and fever.  Respiratory:  Negative for cough.   Cardiovascular:  Negative for chest pain and palpitations.  Gastrointestinal:  Negative for nausea and vomiting.  Psychiatric/Behavioral:  Negative for sleep disturbance and suicidal ideas. The patient is not nervous/anxious.      Objective:    BP 115/82    Pulse 97    Temp 98.1 F (  36.7 C) (Oral)    Ht 6\' 1"  (1.854 m)    Wt 210 lb 9.6 oz (95.5 kg)    SpO2 97%    BMI 27.79 kg/m  BP Readings from Last 3 Encounters:  10/02/21 115/82  07/03/21 110/82  04/14/20 (!) 138/96   Wt Readings from Last 3 Encounters:  10/02/21 210 lb 9.6 oz (95.5 kg)  07/03/21 208 lb 3.2 oz (94.4 kg)  06/04/20 210 lb (95.3 kg)     Physical Exam Vitals reviewed.  Constitutional:      Appearance: He is well-developed.  Cardiovascular:     Rate and Rhythm: Regular rhythm.     Heart sounds: Normal heart sounds.  Pulmonary:     Effort: Pulmonary effort is normal. No respiratory distress.     Breath sounds: Normal breath sounds. No wheezing, rhonchi or rales.  Skin:    General: Skin is warm and dry.  Neurological:     Mental Status: He is  alert.  Psychiatric:        Speech: Speech normal.        Behavior: Behavior normal.       Assessment & Plan:   Problem List Items Addressed This Visit       Other   ADD (attention deficit disorder)    Chronic, stable.  Continue Adderall 20 mg twice daily      Recurrent major depressive disorder, in full remission (HCC) - Primary    Chronic, stable to improved. Continue Zoloft 100 mg, Remeron 15 mg      Relevant Orders   TSH   CBC with Differential/Platelet   Comprehensive metabolic panel   Hemoglobin A1c   Hepatitis C antibody   HIV Antibody (routine testing w rflx)   Lipid panel   PSA   VITAMIN D 25 Hydroxy (Vit-D Deficiency, Fractures)   Tobacco abuse    Congratulated patient on desire for smoking cessation.  I have refilled nicotine patch      Relevant Medications   nicotine (NICODERM CQ - DOSED IN MG/24 HOURS) 14 mg/24hr patch   Other Visit Diagnoses     Screen for colon cancer       Relevant Orders   Ambulatory referral to Gastroenterology        I am having 08/04/20 "Shane" maintain his mupirocin ointment, sertraline, mirtazapine, amphetamine-dextroamphetamine, amphetamine-dextroamphetamine, amphetamine-dextroamphetamine, and nicotine.   Meds ordered this encounter  Medications   nicotine (NICODERM CQ - DOSED IN MG/24 HOURS) 14 mg/24hr patch    Sig: Place 1 patch (14 mg total) onto the skin daily.    Dispense:  28 patch    Refill:  5    box of 14 patches    Order Specific Question:   Supervising Provider    Answer:   [name] [[id number]]    Return precautions given.   Risks, benefits, and alternatives of the medications and treatment plan prescribed today were discussed, and patient expressed understanding.   Education regarding symptom management and diagnosis given to patient on AVS.  Continue to follow with Sherlene Shams, FNP for routine health maintenance.   Allegra Grana and I agreed with plan.   [name], FNP

## 2021-10-02 NOTE — Assessment & Plan Note (Signed)
Chronic, stable.  Continue Adderall 20 mg twice daily

## 2021-10-05 LAB — HEPATITIS C ANTIBODY
Hepatitis C Ab: NONREACTIVE
SIGNAL TO CUT-OFF: 0.05 (ref <1.00)

## 2021-10-05 LAB — HIV ANTIBODY (ROUTINE TESTING W REFLEX): HIV 1&2 Ab, 4th Generation: NONREACTIVE

## 2021-10-06 ENCOUNTER — Telehealth: Payer: Self-pay

## 2021-10-06 NOTE — Telephone Encounter (Signed)
CALLED PATIENT NO ANSWER LEFT VOICEMAIL FOR A CALL BACK ? ?

## 2021-10-07 ENCOUNTER — Telehealth: Payer: Self-pay

## 2021-10-07 NOTE — Telephone Encounter (Signed)
CALLED PATIENT NO ANSWER LEFT VOICEMAIL FOR A CALL BACK ? ?

## 2021-10-07 NOTE — Telephone Encounter (Signed)
LMTCB for lab results.  

## 2021-10-07 NOTE — Telephone Encounter (Signed)
CALLED PATIENT NO ANSWER LEFT VOICEMAIL FOR A CALL BACK  letter sent 

## 2021-10-16 ENCOUNTER — Other Ambulatory Visit: Payer: Self-pay

## 2021-10-28 ENCOUNTER — Other Ambulatory Visit: Payer: Self-pay

## 2021-10-28 ENCOUNTER — Other Ambulatory Visit: Payer: Self-pay | Admitting: Family

## 2021-10-28 DIAGNOSIS — F900 Attention-deficit hyperactivity disorder, predominantly inattentive type: Secondary | ICD-10-CM

## 2021-10-28 MED ORDER — AMPHETAMINE-DEXTROAMPHETAMINE 20 MG PO TABS
20.0000 mg | ORAL_TABLET | Freq: Two times a day (BID) | ORAL | 0 refills | Status: DC
  Filled 2021-10-28: qty 60, 30d supply, fill #0

## 2021-10-28 MED ORDER — AMPHETAMINE-DEXTROAMPHETAMINE 20 MG PO TABS
20.0000 mg | ORAL_TABLET | Freq: Two times a day (BID) | ORAL | 0 refills | Status: DC
  Filled 2021-10-28 – 2022-01-22 (×2): qty 60, 30d supply, fill #0

## 2021-10-28 MED ORDER — AMPHETAMINE-DEXTROAMPHETAMINE 20 MG PO TABS
20.0000 mg | ORAL_TABLET | Freq: Two times a day (BID) | ORAL | 0 refills | Status: DC
  Filled 2021-10-28 – 2021-12-14 (×2): qty 60, 30d supply, fill #0

## 2021-11-03 ENCOUNTER — Telehealth: Payer: Self-pay

## 2021-11-03 NOTE — Telephone Encounter (Signed)
LMTCB for labs. 

## 2021-12-03 ENCOUNTER — Other Ambulatory Visit: Payer: Self-pay

## 2021-12-04 ENCOUNTER — Other Ambulatory Visit: Payer: Self-pay

## 2021-12-07 ENCOUNTER — Other Ambulatory Visit: Payer: Self-pay

## 2021-12-10 ENCOUNTER — Encounter: Payer: Self-pay | Admitting: Family

## 2021-12-10 ENCOUNTER — Other Ambulatory Visit: Payer: Self-pay

## 2021-12-14 ENCOUNTER — Other Ambulatory Visit: Payer: Self-pay

## 2022-01-01 ENCOUNTER — Encounter: Payer: Self-pay | Admitting: Family

## 2022-01-01 ENCOUNTER — Ambulatory Visit: Payer: Self-pay | Admitting: Family

## 2022-01-01 DIAGNOSIS — F900 Attention-deficit hyperactivity disorder, predominantly inattentive type: Secondary | ICD-10-CM

## 2022-01-01 DIAGNOSIS — F3342 Major depressive disorder, recurrent, in full remission: Secondary | ICD-10-CM

## 2022-01-01 DIAGNOSIS — E785 Hyperlipidemia, unspecified: Secondary | ICD-10-CM

## 2022-01-01 DIAGNOSIS — D582 Other hemoglobinopathies: Secondary | ICD-10-CM

## 2022-01-01 NOTE — Progress Notes (Signed)
? ?Subjective:  ? ? Shane [name] ID: Shane ChafeMichael S Gramlich, male    DOB: 06/24/1971, 51 y.o.   MRN: [id number] ? ?CC: Shane [name] is a 51 y.o. male who presents today for follow up.  ? ?HPI: Feels well today ?No new complaints ? ?Shane [name] was told however this week that Shane [name] would need to start looking for a new job and business is slow. Shane [name] understands the reason for this and remains close to Shane [name] employer and friend.  ? ?ADHD-compliant with Adderall 20 mg twice daily.  Shane [name] feels regimen is working well ? ?Depression-compliant with Zoloft 100 mg, Remeron 15 mg. Feels medication is helping.Shane [name] denies any thoughts of hurting himself or anyone else.  Lives for Shane [name] daughters,7117 and 622 years of age. ? ?Shane [name] is smoking again.  ? ?Declines colon cancer screen ? ?HISTORY:  ?Past Medical History:  ?Diagnosis Date  ? ADD (attention deficit disorder)   ? Alcohol abuse   ? History of.  ? Anxiety   ? Depression   ? Hyperlipidemia   ? Hypertension   ? ?Past Surgical History:  ?Procedure Laterality Date  ? TENDON REPAIR Right 09/15/2015  ? Procedure: RIGHT FOREARM EXPLORATION AND CLOSURE OF LACERATION, TENDON REPAIR;  Surgeon: Bradly BienenstockFred Ortmann, MD;  Location: [facility] OR;  Service: Orthopedics;  Laterality: Right;  ? ?Family History  ?Problem Relation Age of Onset  ? Prostate cancer Father   ? Hyperlipidemia Father   ? Hypertension Father   ? Lung cancer Maternal Grandfather   ? Heart attack Maternal Grandfather [id number]  ?     died  ? Alcohol abuse Paternal Grandfather   ? Hypertension Cousin   ? Hyperlipidemia Other   ? Heart attack Paternal Uncle [id number]  ?     died  ? Heart attack Maternal Great-grandfather [id number]  ? Alcohol abuse Maternal Aunt   ? ? ?Allergies: Shane [name] has no known allergies. ?Current Outpatient Medications on File Prior to Visit  ?Medication Sig Dispense Refill  ? amphetamine-dextroamphetamine (ADDERALL) 20 MG tablet Take 1 tablet (20 mg total) by mouth 2 (two) times daily. 60 tablet 0  ? mirtazapine (REMERON) 15 MG tablet TAKE 1 TABLET BY MOUTH  AT BEDTIME. 90 tablet 2  ? mupirocin ointment (BACTROBAN) 2 % Apply 1 application topically 2 (two) times daily. 30 g 2  ? nicotine (NICODERM CQ - DOSED IN MG/24 HOURS) 14 mg/24hr patch Place 1 patch (14 mg total) onto the skin daily. 28 patch 5  ? sertraline (ZOLOFT) 100 MG tablet TAKE 1 TABLET BY MOUTH DAILY 90 tablet 2  ? amphetamine-dextroamphetamine (ADDERALL) 20 MG tablet Take 1 tablet (20 mg total) by mouth 2 (two) times daily. 60 tablet 0  ? amphetamine-dextroamphetamine (ADDERALL) 20 MG tablet Take 1 tablet (20 mg total) by mouth 2 (two) times daily. 60 tablet 0  ? ?No current facility-administered medications on file prior to visit.  ? ? ?Social History  ? ?Tobacco Use  ? Smoking status: Every Day  ?  Packs/day: 0.25  ?  Types: Cigarettes  ? Smokeless tobacco: Never  ?Substance Use Topics  ? Alcohol use: No  ?  Comment: no alcohol since 12/16/2012  ? Drug use: No  ? ? ?Review of Systems  ?Constitutional:  Negative for chills and fever.  ?HENT:  Negative for congestion, ear pain, rhinorrhea, sinus pressure and sore throat.   ?Respiratory:  Negative for cough, shortness of breath and wheezing.   ?Cardiovascular:  Negative for chest pain and palpitations.  ?Gastrointestinal:  Negative  for diarrhea, nausea and vomiting.  ?Genitourinary:  Negative for dysuria.  ?Musculoskeletal:  Negative for myalgias.  ?Skin:  Negative for rash.  ?Neurological:  Negative for headaches.  ?Hematological:  Negative for adenopathy.  ?   ?Objective:  ?  ?BP 130/80 (BP Location: Left Arm, Shane [name] Position: Sitting, Cuff Size: Large)   Pulse 94   Temp 98.7 ?F (37.1 ?C) (Oral)   Ht 6\' 2"  (1.88 m)   Wt 211 lb 3.2 oz (95.8 kg)   SpO2 99%   BMI 27.12 kg/m?  ?BP Readings from Last 3 Encounters:  ?01/01/22 130/80  ?10/02/21 115/82  ?07/03/21 110/82  ? ?Wt Readings from Last 3 Encounters:  ?01/01/22 211 lb 3.2 oz (95.8 kg)  ?10/02/21 210 lb 9.6 oz (95.5 kg)  ?07/03/21 208 lb 3.2 oz (94.4 kg)  ? ? ?Physical Exam ?Vitals reviewed.   ?Constitutional:   ?   Appearance: Shane [name] is well-developed.  ?Cardiovascular:  ?   Rate and Rhythm: Regular rhythm.  ?   Heart sounds: Normal heart sounds.  ?Pulmonary:  ?   Effort: Pulmonary effort is normal. No respiratory distress.  ?   Breath sounds: Normal breath sounds. No wheezing, rhonchi or rales.  ?Skin: ?   General: Skin is warm and dry.  ?Neurological:  ?   Mental Status: Shane [name] is alert.  ?Psychiatric:     ?   Speech: Speech normal.     ?   Behavior: Behavior normal.  ? ? ?   ?Assessment & Plan:  ? ?Problem List Items Addressed This Visit   ? ?  ? Other  ? ADD (attention deficit disorder)  ?  Chronic, stable.  Continue Adderall 20 mg twice daily ?  ?  ? Elevated hemoglobin (HCC)  ?  Smoker ?Shane [name] declines repeating liver enzymes, hemoglobin today.  Previous hemoglobin 17.3.  I have sent a staff message Dr. 07/05/21 and she advised ordering EPO, Jak2, and car monoxide level.  I will order these labs.  will also discuss with Shane [name] pursuing sleep apnea referral. ?  ?  ? Relevant Orders  ? Carbon monoxide, blood (performed at ref lab)  ? JAK2 [id number] rfx CALR/MPL/E12-15  ? Erythropoietin  ? Hyperlipidemia  ?  The 10-year ASCVD risk score (Coutney Wildermuth DK, et al., 2019) is: 9.1% ?Discussed elevated ASCVD risk.  Particularly discussed smoking cessation as greatest risk and Shane [name] plans to go start nicotine patch to aid in smoking cessation. In the setting of elevated LFTs, I am hesitant to start a statin until Shane [name] is comfortable allow me to check labs such as liver function.  Currently is self-pay and looking for a new job.  We will follow-up in 3 months time to see if financial situation has changed so we can order labs and discuss starting Crestor 10 mg ?  ?  ? Relevant Orders  ? Hepatic function panel  ? Recurrent major depressive disorder, in full remission (HCC)  ?  Chronic, stable.  Continue  Zoloft 100 mg, Remeron 15 mg ?  ?  ? ? ? ?I am having 2020 "Shane" maintain Shane [name] mupirocin ointment, sertraline,  mirtazapine, nicotine, amphetamine-dextroamphetamine, amphetamine-dextroamphetamine, and amphetamine-dextroamphetamine. ? ? ?No orders of the defined types were placed in this encounter. ? ? ?Return precautions given.  ? ?Risks, benefits, and alternatives of the medications and treatment plan prescribed today were discussed, and Shane [name] expressed understanding.  ? ?Education regarding symptom management and diagnosis given to Shane [name] on AVS. ? ?Continue to follow with Raneem Mendolia,  Lyn Records, FNP for routine health maintenance.  ? ?Shane [name] and I agreed with plan.  ? ?Rennie Plowman, FNP ? ? ?

## 2022-01-01 NOTE — Assessment & Plan Note (Addendum)
Smoker ?He declines repeating liver enzymes, hemoglobin today.  Previous hemoglobin 17.3.  I have sent a staff message Dr. Cathie Hoops and she advised ordering EPO, Jak2, and car monoxide level.  I will order these labs.  will also discuss with patient pursuing sleep apnea referral. ?

## 2022-01-01 NOTE — Assessment & Plan Note (Signed)
The 10-year ASCVD risk score (Luva Metzger DK, et al., 2019) is: 9.1% ?Discussed elevated ASCVD risk.  Particularly discussed smoking cessation as greatest risk and he plans to go start nicotine patch to aid in smoking cessation. In the setting of elevated LFTs, I am hesitant to start a statin until patient is comfortable allow me to check labs such as liver function.  Currently is self-pay and looking for a new job.  We will follow-up in 3 months time to see if financial situation has changed so we can order labs and discuss starting Crestor 10 mg ?

## 2022-01-01 NOTE — Patient Instructions (Addendum)
Please let me know when you are ready to have labs repeated.   ? ?This is  Dr. Lupita Dawn  example of a  "Low GI"  Diet:  It will allow you to lose 4 to 8  lbs  per month if you follow it carefully.  Your goal with exercise is a minimum of 30 minutes of aerobic exercise 5 days per week (Walking does not count once it becomes easy!)  ? ? All of the foods can be found at grocery stores and in bulk at [employer].  The Atkins protein bars and shakes are available in more varieties at [employer], [employer] and [place].   ? ? ?7 AM Breakfast:  Choose from the following: ? Low carbohydrate Protein  Shakes (I recommend the  Premier Protein chocolate shakes,  EAS AdvantEdge "Carb Control" shakes  Or the Atkins shakes all are under 3 net carbs)  ?   a scrambled egg/bacon/cheese burrito made with [employer]'s "carb balance" whole wheat tortilla  (about 10 net carbs ) ? [employer] sells Physicist, medical (basically a quiche without the pastry crust) that is eaten cold and very convenient way to get your eggs.  8 carbs) ? If you make your own protein shakes, avoid bananas and pineapple,  And use low carb greek yogurt or original /unsweetened almond or soy milk  ? ? Avoid cereal and bananas, oatmeal and cream of wheat and grits. They are loaded with carbohydrates! ? ? ?10 AM: high protein snack: ? Protein bar by Atkins (the snack size, under 200 cal, usually < 6 net carbs).   ? A stick of cheese:  Around 1 carb,  100 cal    ? Dannon Light n Fit Greek Yogurt  (80 cal, 8 carbs)  ?Other so called "protein bars" and Greek yogurts tend to be loaded with carbohydrates.  Remember, in food advertising, the word "energy" is synonymous for " carbohydrate." ? ?Lunch:  ? A Sandwich using the bread choices listed, Can use any  Eggs,  lunchmeat, grilled meat or canned tuna), avocado, regular mayo/mustard  and cheese. ? A Salad using blue cheese, ranch,  Goddess or vinagrette,  Avoid taco shells, croutons or "confetti" and no "candied nuts" but  regular nuts OK.  ? No pretzels, nabs  or chips.  Pickles and miniature sweet peppers are a good low carb alternative that provide a "crunch" ? The bread is the only source of carbohydrate in a sandwich and  can be decreased by trying some of the attached alternatives to traditional loaf bread ?  ?Avoid "Low fat dressings, as well as Chestnut dressings They are loaded with sugar! ? ? ?3 PM/ Mid day  Snack: ? Consider  1 ounce of  almonds, walnuts, pistachios, pecans, peanuts,  Macadamia nuts or a nut medley. ? Avoid "granola and granola bars " ? Mixed nuts are ok in moderation as long as there are no raisins,  cranberries or dried fruit.  ? KIND bars are OK if you get the low glycemic index variety  ? Try the prosciutto/mozzarella cheese sticks by [employer]  In deli /backery section   High protein  ?  ?  ?6 PM  Dinner:   ?  Meat/fowl/fish with a green salad, and either broccoli, cauliflower, green beans, spinach, brussel sprouts or  Lima beans. DO NOT BREAD THE PROTEIN!!   ?   There is a low carb pasta by Dreamfield's that is acceptable and tastes great: only 5  digestible carbs/serving.( All grocery stores but BJs carry it ) ?Several ready made meals are available low carb:  ? Try Michel Angelo's chicken piccata or chicken or eggplant parm over low carb pasta.(Lowes and BJs)  ? Marjory Lies Sanchez's "Carnitas" (pulled pork, no sauce,  0 carbs) or his beef pot roast to make a dinner burrito (at [employer]) ? Pesto over low carb pasta (bj's sells a good quality pesto in the center refrigerated section of the deli  ? Try satueeing  Cheral Marker with mushroooms as a good side  ? [employer] makes a mashed cauliflower that tastes like mashed potatoes ? ?Whole wheat pasta is still full of digestible carbs and  Not as low in glycemic index as Dreamfield's.   ?Brown rice is still rice,  So skip the rice and noodles if you eat [place] or [place] (or at least limit to 1/2 cup) ? ?9 PM snack :  ? Breyer's "low carb" fudgsicle  or  ice cream bar (Carb Smart line), or  Weight Watcher's ice cream bar , or another "no sugar added" ice cream; ? a serving of fresh berries/cherries with whipped cream  ? Cheese or DANNON'S LlGHT N FIT GREEK YOGURT ? 8 ounces of [place] unsweetened almond/cococunut milk   ? Treat yourself to a parfait made with whipped cream blueberiies, walnuts and vanilla greek yogurt ? ?Avoid bananas, pineapple, grapes  and watermelon on a regular basis because they are high in sugar.  THINK OF THEM AS DESSERT ? ?Remember that snack Substitutions should be less than 10 NET carbs per serving and meals < 20 carbs. Remember to subtract fiber grams to get the "net carbs." ? ? ? ?

## 2022-01-05 NOTE — Assessment & Plan Note (Signed)
Chronic, stable.  Continue Adderall 20 mg twice daily °

## 2022-01-05 NOTE — Assessment & Plan Note (Signed)
Chronic, stable.  Continue Zoloft 100 mg, Remeron 15mg 

## 2022-01-05 NOTE — Telephone Encounter (Signed)
-----   Message from Rickard Patience, MD sent at 01/04/2022 10:54 AM EDT ----- ?Hi Bijou Easler,  ?I agree with you that smoking probably contribute to his erythrocytosis. Other reason is sleep apnea, may need to do a sleep study. These 2 are the most common reasons for secondary erythrocytosis.  ?If he has no insurance, is he ok with seeing a different doctor for work up or does he want to do some labs and study with you?  I recommend Epo, jak2 mutation with reflex [[id number]] and cabomonoxide level  ?I am happy to see him. But if he is concerned about insurance coverage seeing a specialist, you can start with labs and sleep study.  ? ?Hope these help.  ? ?Zhou ? ? ? ?----- Message ----- ?From: Allegra Grana, FNP ?Sent: 01/01/2022   4:33 PM EDT ?To: Rickard Patience, MD ? ?Dr Cathie Hoops, ? ?Hope you are well.  ? ?I wanted refer patient to you as hemoglobin is at 17.3 ( 09/2021) and has been that way off and on for the past 5 years.  He is a smoker and suspect that is most likely the reason. ? ?He is self pay/no insurance. He declines repeat labs today.  ? ?How concerned should I be with his hemoglobin?  His AST and ALT are slightly elevated from 09/2021 as well but he declined my repeating. ? ?Should I try to order EPO lab if he would let me ahead of consult with you? ? ?Claris Che ? ? ? ? ?

## 2022-01-18 ENCOUNTER — Other Ambulatory Visit: Payer: Self-pay

## 2022-01-22 ENCOUNTER — Other Ambulatory Visit: Payer: Self-pay

## 2022-03-03 ENCOUNTER — Other Ambulatory Visit: Payer: Self-pay

## 2022-03-03 ENCOUNTER — Telehealth: Payer: Self-pay

## 2022-03-03 ENCOUNTER — Telehealth: Payer: Self-pay | Admitting: Family

## 2022-03-03 ENCOUNTER — Encounter: Payer: Self-pay | Admitting: Family

## 2022-03-03 DIAGNOSIS — Z0000 Encounter for general adult medical examination without abnormal findings: Secondary | ICD-10-CM

## 2022-03-03 DIAGNOSIS — D721 Eosinophilia, unspecified: Secondary | ICD-10-CM | POA: Insufficient documentation

## 2022-03-03 NOTE — Telephone Encounter (Signed)
Call pt He never came for labs to eval elevated eosinophil I would prefer to have done prior to appt so we can review at time of visit   Please sch non fasting labs

## 2022-03-03 NOTE — Telephone Encounter (Signed)
LMTCB to office to schedule appointment and get labs done before appointment. Labs ordered already

## 2022-03-03 NOTE — Telephone Encounter (Signed)
LMTCB to office to schedule appointment in 6-8 wks and have labs done before appointment. Labs ordered

## 2022-03-15 NOTE — Telephone Encounter (Signed)
Has appointment scheduled for 04/02/22 labs have been ordered as well

## 2022-04-02 ENCOUNTER — Encounter: Payer: Self-pay | Admitting: Family

## 2022-04-02 ENCOUNTER — Ambulatory Visit (INDEPENDENT_AMBULATORY_CARE_PROVIDER_SITE_OTHER): Payer: Self-pay | Admitting: Family

## 2022-04-02 ENCOUNTER — Other Ambulatory Visit: Payer: Self-pay

## 2022-04-02 DIAGNOSIS — F3342 Major depressive disorder, recurrent, in full remission: Secondary | ICD-10-CM

## 2022-04-02 DIAGNOSIS — D582 Other hemoglobinopathies: Secondary | ICD-10-CM

## 2022-04-02 DIAGNOSIS — F900 Attention-deficit hyperactivity disorder, predominantly inattentive type: Secondary | ICD-10-CM

## 2022-04-02 MED ORDER — AMPHETAMINE-DEXTROAMPHETAMINE 20 MG PO TABS
20.0000 mg | ORAL_TABLET | Freq: Two times a day (BID) | ORAL | 0 refills | Status: DC
  Filled 2022-04-02 – 2022-06-17 (×2): qty 60, 30d supply, fill #0

## 2022-04-02 MED ORDER — AMPHETAMINE-DEXTROAMPHETAMINE 20 MG PO TABS
20.0000 mg | ORAL_TABLET | Freq: Two times a day (BID) | ORAL | 0 refills | Status: DC
  Filled 2022-04-02: qty 60, 30d supply, fill #0

## 2022-04-02 MED ORDER — MIRTAZAPINE 15 MG PO TABS
ORAL_TABLET | Freq: Every day | ORAL | 2 refills | Status: DC
  Filled 2022-04-02: qty 90, fill #0
  Filled 2022-04-23: qty 90, 90d supply, fill #0
  Filled 2022-07-30: qty 30, 30d supply, fill #1
  Filled 2022-09-02: qty 30, 30d supply, fill #2
  Filled 2022-10-07: qty 30, 30d supply, fill #3
  Filled 2022-11-17: qty 30, 30d supply, fill #4
  Filled 2023-01-25: qty 30, 30d supply, fill #5
  Filled 2023-03-03: qty 30, 30d supply, fill #6

## 2022-04-02 MED ORDER — SERTRALINE HCL 100 MG PO TABS
ORAL_TABLET | Freq: Every day | ORAL | 2 refills | Status: DC
  Filled 2022-04-02 – 2022-04-23 (×2): qty 90, 90d supply, fill #0
  Filled 2022-09-02: qty 34, 34d supply, fill #1
  Filled 2022-10-07: qty 34, 34d supply, fill #2
  Filled 2022-11-17: qty 34, 34d supply, fill #3
  Filled 2023-01-25: qty 34, 34d supply, fill #4
  Filled 2023-03-03: qty 34, 34d supply, fill #5

## 2022-04-02 NOTE — Progress Notes (Signed)
Needs refill of Adderall

## 2022-04-02 NOTE — Progress Notes (Signed)
Subjective:    Patient ID: Shane [name], male    DOB: [birth year], 51 y.o.   MRN: [id number]  CC: Shane [name] is a 51 y.o. male who presents today for follow up.   HPI: Follow up ADHD  Feels well today No new complaints. He continues to look for employment  Compliant with Adderall 20 mg twice daily and regimen is working well.   Depression-compliant with Zoloft 100 mg, Remeron 15 mg. Feels well on current dose. Denies si/hi He has two daughters and lives for his daughters.   Elevated hemoglobin  HISTORY:  Past Medical History:  Diagnosis Date   ADD (attention deficit disorder)    Alcohol abuse    History of.   Anxiety    Depression    Hyperlipidemia    Hypertension    Past Surgical History:  Procedure Laterality Date   TENDON REPAIR Right 09/15/2015   Procedure: RIGHT FOREARM EXPLORATION AND CLOSURE OF LACERATION, TENDON REPAIR;  Surgeon: Bradly Bienenstock, MD;  Location: [facility] OR;  Service: Orthopedics;  Laterality: Right;   Family History  Problem Relation Age of Onset   Prostate cancer Father    Hyperlipidemia Father    Hypertension Father    Lung cancer Maternal Grandfather    Heart attack Maternal Grandfather 22       died   Alcohol abuse Paternal Grandfather    Hypertension Cousin    Hyperlipidemia Other    Heart attack Paternal Uncle 57       died   Heart attack Maternal Great-grandfather 62   Alcohol abuse Maternal Aunt     Allergies: Patient has no known allergies. Current Outpatient Medications on File Prior to Visit  Medication Sig Dispense Refill   mupirocin ointment (BACTROBAN) 2 % Apply 1 application topically 2 (two) times daily. 30 g 2   nicotine (NICODERM CQ - DOSED IN MG/24 HOURS) 14 mg/24hr patch Place 1 patch (14 mg total) onto the skin daily. 28 patch 5   No current facility-administered medications on file prior to visit.    Social History   Tobacco Use   Smoking status: Every Day    Packs/day: 0.25    Types: Cigarettes    Smokeless tobacco: Never  Substance Use Topics   Alcohol use: No    Comment: no alcohol since 12/16/2012   Drug use: No    Review of Systems  Constitutional:  Negative for chills and fever.  Respiratory:  Negative for cough.   Cardiovascular:  Negative for chest pain and palpitations.  Gastrointestinal:  Negative for nausea and vomiting.  Psychiatric/Behavioral:  Negative for suicidal ideas.       Objective:    BP 122/82 (BP Location: Left Arm, Patient Position: Sitting, Cuff Size: Normal)   Pulse (!) 107   Temp 98.3 F (36.8 C)   Ht 6\' 2"  (1.88 m)   Wt 209 lb (94.8 kg)   SpO2 97%   BMI 26.83 kg/m  BP Readings from Last 3 Encounters:  04/02/22 122/82  01/01/22 130/80  10/02/21 115/82   Wt Readings from Last 3 Encounters:  04/02/22 209 lb (94.8 kg)  01/01/22 211 lb 3.2 oz (95.8 kg)  10/02/21 210 lb 9.6 oz (95.5 kg)    Physical Exam Vitals reviewed.  Constitutional:      Appearance: He is well-developed.  Cardiovascular:     Rate and Rhythm: Regular rhythm.     Heart sounds: Normal heart sounds.  Pulmonary:     Effort: Pulmonary effort  is normal. No respiratory distress.     Breath sounds: Normal breath sounds. No wheezing, rhonchi or rales.  Skin:    General: Skin is warm and dry.  Neurological:     Mental Status: He is alert.  Psychiatric:        Speech: Speech normal.        Behavior: Behavior normal.        Assessment & Plan:   Problem List Items Addressed This Visit       Other   ADD (attention deficit disorder)    Chronic stable. Continue adderall 20 mg BID. I looked up patient on [place] Controlled Substances Reporting System PMP AWARE and saw no activity that raised concern of inappropriate use.        Relevant Medications   amphetamine-dextroamphetamine (ADDERALL) 20 MG tablet   amphetamine-dextroamphetamine (ADDERALL) 20 MG tablet   amphetamine-dextroamphetamine (ADDERALL) 20 MG tablet   Elevated hemoglobin (HCC)    Patient continues to  declines lab testing due to cost.       Recurrent major depressive disorder, in full remission (HCC)    Chronic, stable. Continue Zoloft 100 mg, Remeron 15 mg.       Relevant Medications   sertraline (ZOLOFT) 100 MG tablet   mirtazapine (REMERON) 15 MG tablet     I am having Shane Chafe "Shane" maintain his mupirocin ointment, nicotine, amphetamine-dextroamphetamine, amphetamine-dextroamphetamine, amphetamine-dextroamphetamine, sertraline, and mirtazapine.   Meds ordered this encounter  Medications   amphetamine-dextroamphetamine (ADDERALL) 20 MG tablet    Sig: Take 1 tablet (20 mg total) by mouth 2 (two) times daily.    Dispense:  60 tablet    Refill:  0    DNF 06/02/22    Order Specific Question:   Supervising Provider    Answer:   Sherlene Shams [[id number]]   amphetamine-dextroamphetamine (ADDERALL) 20 MG tablet    Sig: Take 1 tablet (20 mg total) by mouth 2 (two) times daily.    Dispense:  60 tablet    Refill:  0    DNF 05/02/22    Order Specific Question:   Supervising Provider    Answer:   Sherlene Shams [[id number]]   amphetamine-dextroamphetamine (ADDERALL) 20 MG tablet    Sig: Take 1 tablet (20 mg total) by mouth 2 (two) times daily.    Dispense:  60 tablet    Refill:  0    Order Specific Question:   Supervising Provider    Answer:   Duncan Dull L [[id number]]   sertraline (ZOLOFT) 100 MG tablet    Sig: TAKE 1 TABLET BY MOUTH DAILY    Dispense:  90 tablet    Refill:  2    Order Specific Question:   Supervising Provider    Answer:   Duncan Dull L [[id number]]   mirtazapine (REMERON) 15 MG tablet    Sig: TAKE 1 TABLET BY MOUTH AT BEDTIME.    Dispense:  90 tablet    Refill:  2    Order Specific Question:   Supervising Provider    Answer:   Sherlene Shams [[id number]]    Return precautions given.   Risks, benefits, and alternatives of the medications and treatment plan prescribed today were discussed, and patient expressed understanding.   Education regarding symptom  management and diagnosis given to patient on AVS.  Continue to follow with Allegra Grana, FNP for routine health maintenance.   Shane Chafe and I agreed with plan.   Rennie Plowman, FNP

## 2022-04-02 NOTE — Assessment & Plan Note (Signed)
Patient continues to declines lab testing due to cost.

## 2022-04-02 NOTE — Assessment & Plan Note (Signed)
Chronic stable. Continue adderall 20 mg BID. I looked up patient on [place] Controlled Substances Reporting System PMP AWARE and saw no activity that raised concern of inappropriate use.

## 2022-04-02 NOTE — Assessment & Plan Note (Signed)
Chronic, stable.  Continue Zoloft 100 mg, Remeron 15mg 

## 2022-04-23 ENCOUNTER — Other Ambulatory Visit: Payer: Self-pay

## 2022-05-25 ENCOUNTER — Other Ambulatory Visit: Payer: Self-pay

## 2022-06-17 ENCOUNTER — Other Ambulatory Visit: Payer: Self-pay

## 2022-06-18 ENCOUNTER — Other Ambulatory Visit: Payer: Self-pay

## 2022-07-05 ENCOUNTER — Other Ambulatory Visit: Payer: Self-pay

## 2022-07-05 ENCOUNTER — Encounter: Payer: Self-pay | Admitting: Family

## 2022-07-05 ENCOUNTER — Ambulatory Visit: Payer: BC Managed Care – PPO | Admitting: Family

## 2022-07-05 VITALS — BP 130/84 | HR 107 | Temp 98.5°F | Ht 74.0 in | Wt 208.4 lb

## 2022-07-05 DIAGNOSIS — F900 Attention-deficit hyperactivity disorder, predominantly inattentive type: Secondary | ICD-10-CM | POA: Diagnosis not present

## 2022-07-05 DIAGNOSIS — D582 Other hemoglobinopathies: Secondary | ICD-10-CM

## 2022-07-05 DIAGNOSIS — Z720 Tobacco use: Secondary | ICD-10-CM | POA: Diagnosis not present

## 2022-07-05 DIAGNOSIS — F3342 Major depressive disorder, recurrent, in full remission: Secondary | ICD-10-CM

## 2022-07-05 DIAGNOSIS — R7301 Impaired fasting glucose: Secondary | ICD-10-CM | POA: Diagnosis not present

## 2022-07-05 LAB — POCT GLYCOSYLATED HEMOGLOBIN (HGB A1C): Hemoglobin A1C: 5.8 % — AB (ref 4.0–5.6)

## 2022-07-05 MED ORDER — AMPHETAMINE-DEXTROAMPHETAMINE 20 MG PO TABS
20.0000 mg | ORAL_TABLET | Freq: Two times a day (BID) | ORAL | 0 refills | Status: DC
  Filled 2022-07-05 – 2022-07-30 (×2): qty 60, 30d supply, fill #0

## 2022-07-05 MED ORDER — AMPHETAMINE-DEXTROAMPHETAMINE 20 MG PO TABS
20.0000 mg | ORAL_TABLET | Freq: Two times a day (BID) | ORAL | 0 refills | Status: DC
  Filled 2022-07-05: qty 60, 30d supply, fill #0

## 2022-07-05 MED ORDER — NICOTINE 14 MG/24HR TD PT24
14.0000 mg | MEDICATED_PATCH | Freq: Every day | TRANSDERMAL | 5 refills | Status: DC
  Filled 2022-07-05 – 2022-11-17 (×2): qty 28, 28d supply, fill #0

## 2022-07-05 NOTE — Assessment & Plan Note (Signed)
Chronic, stable.  Continue Adderall 20 mg twice daily.  I have refilled today I looked up patient on [place] Controlled Substances Reporting System PMP AWARE and saw no activity that raised concern of inappropriate use.

## 2022-07-05 NOTE — Assessment & Plan Note (Signed)
Chronic, stable.  Continue Zoloft 100 mg, Remeron 15mg 

## 2022-07-05 NOTE — Telephone Encounter (Signed)
Per request by Mable Paris

## 2022-07-05 NOTE — Progress Notes (Signed)
Discussed during OV. Please see OV notes

## 2022-07-05 NOTE — Progress Notes (Signed)
Subjective:    Patient ID: Shane [name], male    DOB: [birth year], 51 y.o.   MRN: [id number]  CC: [name] is a 51 y.o. male who presents today for follow up.   HPI: Feels well today.  No new complaints.  He started a new job and feels much much better.  He is very happy overall.  Medication regimen is working well for him.  He is sleeping well.  He does not feel increased anxiety on Adderall  Compliant with Adderall 20 mg twice daily Compliant with Zoloft 100 mg, Remeron 15 mg   HISTORY:  Past Medical History:  Diagnosis Date  . ADD (attention deficit disorder)   . Alcohol abuse    History of.  . Anxiety   . Depression   . Hyperlipidemia   . Hypertension    Past Surgical History:  Procedure Laterality Date  . TENDON REPAIR Right 09/15/2015   Procedure: RIGHT FOREARM EXPLORATION AND CLOSURE OF LACERATION, TENDON REPAIR;  Surgeon: Bradly Bienenstock, MD;  Location: [facility] OR;  Service: Orthopedics;  Laterality: Right;   Family History  Problem Relation Age of Onset  . Prostate cancer Father   . Hyperlipidemia Father   . Hypertension Father   . Lung cancer Maternal Grandfather   . Heart attack Maternal Grandfather 66       died  . Alcohol abuse Paternal Grandfather   . Hypertension Cousin   . Hyperlipidemia Other   . Heart attack Paternal Uncle 52       died  . Heart attack Maternal Great-grandfather 71  . Alcohol abuse Maternal Aunt     Allergies: Patient has no known allergies. Current Outpatient Medications on File Prior to Visit  Medication Sig Dispense Refill  . mirtazapine (REMERON) 15 MG tablet TAKE 1 TABLET BY MOUTH AT BEDTIME. 90 tablet 2  . mupirocin ointment (BACTROBAN) 2 % Apply 1 application topically 2 (two) times daily. 30 g 2  . sertraline (ZOLOFT) 100 MG tablet TAKE 1 TABLET BY MOUTH DAILY 90 tablet 2   No current facility-administered medications on file prior to visit.    Social History   Tobacco Use  . Smoking status: Every Day     Packs/day: 0.25    Types: Cigarettes  . Smokeless tobacco: Never  Substance Use Topics  . Alcohol use: No    Comment: no alcohol since 12/16/2012  . Drug use: No    Review of Systems  Constitutional:  Negative for chills and fever.  Respiratory:  Negative for cough.   Cardiovascular:  Negative for chest pain and palpitations.  Gastrointestinal:  Negative for nausea and vomiting.      Objective:    BP 130/84 (BP Location: Left Arm, Patient Position: Sitting, Cuff Size: Normal)   Pulse (!) 107   Temp 98.5 F (36.9 C) (Oral)   Ht 6\' 2"  (1.88 m)   Wt 208 lb 6.4 oz (94.5 kg)   SpO2 97%   BMI 26.76 kg/m  BP Readings from Last 3 Encounters:  07/05/22 130/84  04/02/22 122/82  01/01/22 130/80   Wt Readings from Last 3 Encounters:  07/05/22 208 lb 6.4 oz (94.5 kg)  04/02/22 209 lb (94.8 kg)  01/01/22 211 lb 3.2 oz (95.8 kg)    Physical Exam Vitals reviewed.  Constitutional:      Appearance: He is well-developed.  Cardiovascular:     Rate and Rhythm: Regular rhythm.     Heart sounds: Normal heart sounds.  Pulmonary:  Effort: Pulmonary effort is normal. No respiratory distress.     Breath sounds: Normal breath sounds. No wheezing, rhonchi or rales.  Skin:    General: Skin is warm and dry.  Neurological:     Mental Status: He is alert.  Psychiatric:        Speech: Speech normal.        Behavior: Behavior normal.       Assessment & Plan:   Problem List Items Addressed This Visit       Other   ADD (attention deficit disorder)    Chronic, stable.  Continue Adderall 20 mg twice daily.  I have refilled today I looked up patient on [place] Controlled Substances Reporting System PMP AWARE and saw no activity that raised concern of inappropriate use.        Relevant Medications   amphetamine-dextroamphetamine (ADDERALL) 20 MG tablet   amphetamine-dextroamphetamine (ADDERALL) 20 MG tablet   amphetamine-dextroamphetamine (ADDERALL) 20 MG tablet   Elevated hemoglobin  (HCC)    Politely declines recheck of hemoglobin at this time.  He is working on smoking cessation.  He will recheck labs in a year at his physical.      Recurrent major depressive disorder, in full remission ([place])    Chronic, stable.  Continue Zoloft 100 mg, Remeron 15mg       Tobacco abuse   Relevant Medications   nicotine (NICODERM CQ - DOSED IN MG/24 HOURS) 14 mg/24hr patch   Other Visit Diagnoses     Impaired fasting glucose    -  Primary   Relevant Orders   POCT HgB A1C (Completed)        I am having Shane [name] "Shane" maintain his mupirocin ointment, sertraline, mirtazapine, nicotine, amphetamine-dextroamphetamine, amphetamine-dextroamphetamine, and amphetamine-dextroamphetamine.   Meds ordered this encounter  Medications  . nicotine (NICODERM CQ - DOSED IN MG/24 HOURS) 14 mg/24hr patch    Sig: Place 1 patch (14 mg total) onto the skin daily.    Dispense:  28 patch    Refill:  5    box of 14 patches    Order Specific Question:   Supervising Provider    Answer:   TULLO, TERESA L [[id number]]  . amphetamine-dextroamphetamine (ADDERALL) 20 MG tablet    Sig: Take 1 tablet (20 mg total) by mouth 2 (two) times daily.    Dispense:  60 tablet    Refill:  0    DNF 08/17/22    Order Specific Question:   Supervising Provider    Answer:   Deborra Medina L [[id number]]  . amphetamine-dextroamphetamine (ADDERALL) 20 MG tablet    Sig: Take 1 tablet (20 mg total) by mouth 2 (two) times daily.    Dispense:  60 tablet    Refill:  0    DNF 07/17/22    Order Specific Question:   Supervising Provider    Answer:   Deborra Medina L [[id number]]  . amphetamine-dextroamphetamine (ADDERALL) 20 MG tablet    Sig: Take 1 tablet (20 mg total) by mouth 2 (two) times daily.    Dispense:  60 tablet    Refill:  0    DNF 09/16/22    Order Specific Question:   Supervising Provider    Answer:   Crecencio Mc [[id number]]    Return precautions given.   Risks, benefits, and alternatives of the medications  and treatment plan prescribed today were discussed, and patient expressed understanding.   Education regarding symptom management and diagnosis given to patient on AVS.  Continue to follow with Burnard Hawthorne, FNP for routine health maintenance.   Shane [name] and I agreed with plan.   Shane Paris, FNP

## 2022-07-05 NOTE — Assessment & Plan Note (Signed)
Politely declines recheck of hemoglobin at this time.  He is working on smoking cessation.  He will recheck labs in a year at his physical.

## 2022-07-27 ENCOUNTER — Other Ambulatory Visit: Payer: Self-pay

## 2022-07-30 ENCOUNTER — Other Ambulatory Visit: Payer: Self-pay

## 2022-08-04 DIAGNOSIS — I2510 Atherosclerotic heart disease of native coronary artery without angina pectoris: Secondary | ICD-10-CM

## 2022-08-04 DIAGNOSIS — I5022 Chronic systolic (congestive) heart failure: Secondary | ICD-10-CM

## 2022-08-04 HISTORY — DX: Chronic systolic (congestive) heart failure: I50.22

## 2022-08-04 HISTORY — DX: Atherosclerotic heart disease of native coronary artery without angina pectoris: I25.10

## 2022-09-02 ENCOUNTER — Encounter
Admission: EM | Disposition: A | Discharge status: Home / Self Care | Payer: Self-pay | Source: Home / Self Care | Attending: Internal Medicine

## 2022-09-02 ENCOUNTER — Inpatient Hospital Stay
Admission: EM | Admit: 2022-09-02 | Discharge: 2022-09-05 | DRG: 228 | Disposition: A | Discharge status: Home / Self Care | Payer: BC Managed Care – PPO | Attending: Internal Medicine | Admitting: Internal Medicine

## 2022-09-02 ENCOUNTER — Emergency Department: Payer: BC Managed Care – PPO

## 2022-09-02 ENCOUNTER — Other Ambulatory Visit: Payer: Self-pay

## 2022-09-02 ENCOUNTER — Inpatient Hospital Stay: Payer: BC Managed Care – PPO

## 2022-09-02 DIAGNOSIS — F909 Attention-deficit hyperactivity disorder, unspecified type: Secondary | ICD-10-CM | POA: Diagnosis not present

## 2022-09-02 DIAGNOSIS — Z79899 Other long term (current) drug therapy: Secondary | ICD-10-CM

## 2022-09-02 DIAGNOSIS — I213 ST elevation (STEMI) myocardial infarction of unspecified site: Principal | ICD-10-CM

## 2022-09-02 DIAGNOSIS — E785 Hyperlipidemia, unspecified: Secondary | ICD-10-CM | POA: Diagnosis not present

## 2022-09-02 DIAGNOSIS — I252 Old myocardial infarction: Secondary | ICD-10-CM | POA: Diagnosis not present

## 2022-09-02 DIAGNOSIS — I5041 Acute combined systolic (congestive) and diastolic (congestive) heart failure: Secondary | ICD-10-CM | POA: Diagnosis not present

## 2022-09-02 DIAGNOSIS — Z8042 Family history of malignant neoplasm of prostate: Secondary | ICD-10-CM | POA: Diagnosis not present

## 2022-09-02 DIAGNOSIS — I493 Ventricular premature depolarization: Secondary | ICD-10-CM | POA: Diagnosis not present

## 2022-09-02 DIAGNOSIS — I2119 ST elevation (STEMI) myocardial infarction involving other coronary artery of inferior wall: Secondary | ICD-10-CM

## 2022-09-02 DIAGNOSIS — F32A Depression, unspecified: Secondary | ICD-10-CM | POA: Diagnosis present

## 2022-09-02 DIAGNOSIS — Z83438 Family history of other disorder of lipoprotein metabolism and other lipidemia: Secondary | ICD-10-CM | POA: Diagnosis not present

## 2022-09-02 DIAGNOSIS — Z801 Family history of malignant neoplasm of trachea, bronchus and lung: Secondary | ICD-10-CM | POA: Diagnosis not present

## 2022-09-02 DIAGNOSIS — I5042 Chronic combined systolic (congestive) and diastolic (congestive) heart failure: Secondary | ICD-10-CM

## 2022-09-02 DIAGNOSIS — Z9861 Coronary angioplasty status: Secondary | ICD-10-CM | POA: Diagnosis not present

## 2022-09-02 DIAGNOSIS — F17210 Nicotine dependence, cigarettes, uncomplicated: Secondary | ICD-10-CM | POA: Diagnosis not present

## 2022-09-02 DIAGNOSIS — Z720 Tobacco use: Secondary | ICD-10-CM | POA: Diagnosis not present

## 2022-09-02 DIAGNOSIS — I472 Ventricular tachycardia, unspecified: Secondary | ICD-10-CM | POA: Diagnosis not present

## 2022-09-02 DIAGNOSIS — J4530 Mild persistent asthma, uncomplicated: Secondary | ICD-10-CM | POA: Diagnosis present

## 2022-09-02 DIAGNOSIS — I25110 Atherosclerotic heart disease of native coronary artery with unstable angina pectoris: Secondary | ICD-10-CM

## 2022-09-02 DIAGNOSIS — T68XXXA Hypothermia, initial encounter: Secondary | ICD-10-CM | POA: Diagnosis not present

## 2022-09-02 DIAGNOSIS — I10 Essential (primary) hypertension: Secondary | ICD-10-CM | POA: Diagnosis present

## 2022-09-02 DIAGNOSIS — Z955 Presence of coronary angioplasty implant and graft: Secondary | ICD-10-CM | POA: Insufficient documentation

## 2022-09-02 DIAGNOSIS — I429 Cardiomyopathy, unspecified: Secondary | ICD-10-CM | POA: Diagnosis not present

## 2022-09-02 DIAGNOSIS — F988 Other specified behavioral and emotional disorders with onset usually occurring in childhood and adolescence: Secondary | ICD-10-CM | POA: Diagnosis present

## 2022-09-02 DIAGNOSIS — R0789 Other chest pain: Secondary | ICD-10-CM | POA: Diagnosis not present

## 2022-09-02 DIAGNOSIS — R079 Chest pain, unspecified: Secondary | ICD-10-CM | POA: Diagnosis not present

## 2022-09-02 DIAGNOSIS — E7800 Pure hypercholesterolemia, unspecified: Secondary | ICD-10-CM | POA: Diagnosis not present

## 2022-09-02 DIAGNOSIS — F419 Anxiety disorder, unspecified: Secondary | ICD-10-CM | POA: Diagnosis present

## 2022-09-02 HISTORY — DX: ST elevation (STEMI) myocardial infarction involving other coronary artery of inferior wall: I21.19

## 2022-09-02 HISTORY — PX: CORONARY/GRAFT ACUTE MI REVASCULARIZATION: CATH118305

## 2022-09-02 HISTORY — PX: LEFT HEART CATH AND CORS/GRAFTS ANGIOGRAPHY: CATH118250

## 2022-09-02 LAB — BASIC METABOLIC PANEL
Anion gap: 9 (ref 5–15)
BUN: 14 mg/dL (ref 6–20)
CO2: 24 mmol/L (ref 22–32)
Calcium: 9.1 mg/dL (ref 8.9–10.3)
Chloride: 104 mmol/L (ref 98–111)
Creatinine, Ser: 0.92 mg/dL (ref 0.61–1.24)
GFR, Estimated: >60 mL/min (ref >60)
Glucose, Bld: 148 mg/dL — ABNORMAL HIGH (ref 70–99)
Potassium: 4.1 mmol/L (ref 3.5–5.1)
Sodium: 137 mmol/L (ref 135–145)

## 2022-09-02 LAB — CBC WITH DIFFERENTIAL/PLATELET
Abs Immature Granulocytes: 0.04 10*3/uL (ref 0.00–0.07)
Basophils Absolute: 0 10*3/uL (ref 0.0–0.1)
Basophils Relative: 0 %
Eosinophils Absolute: 0 10*3/uL (ref 0.0–0.5)
Eosinophils Relative: 0 %
HCT: 49.4 % (ref 39.0–52.0)
Hemoglobin: 16.4 g/dL (ref 13.0–17.0)
Immature Granulocytes: 0 %
Lymphocytes Relative: 6 %
Lymphs Abs: 0.8 10*3/uL (ref 0.7–4.0)
MCH: 32.2 pg (ref 26.0–34.0)
MCHC: 33.2 g/dL (ref 30.0–36.0)
MCV: 96.9 fL (ref 80.0–100.0)
Monocytes Absolute: 0.5 10*3/uL (ref 0.1–1.0)
Monocytes Relative: 4 %
Neutro Abs: 11.2 10*3/uL — ABNORMAL HIGH (ref 1.7–7.7)
Neutrophils Relative %: 90 %
Platelets: 290 10*3/uL (ref 150–400)
RBC: 5.1 MIL/uL (ref 4.22–5.81)
RDW: 12.8 % (ref 11.5–15.5)
WBC: 12.5 10*3/uL — ABNORMAL HIGH (ref 4.0–10.5)
nRBC: 0 % (ref 0.0–0.2)

## 2022-09-02 LAB — TROPONIN I (HIGH SENSITIVITY)
Troponin I (High Sensitivity): 214 ng/L (ref <18)
Troponin I (High Sensitivity): >24000 ng/L (ref <18)

## 2022-09-02 LAB — CBC
HCT: 45.5 % (ref 39.0–52.0)
Hemoglobin: 15.7 g/dL (ref 13.0–17.0)
MCH: 33.1 pg (ref 26.0–34.0)
MCHC: 34.5 g/dL (ref 30.0–36.0)
MCV: 95.8 fL (ref 80.0–100.0)
Platelets: 262 10*3/uL (ref 150–400)
RBC: 4.75 MIL/uL (ref 4.22–5.81)
RDW: 12.8 % (ref 11.5–15.5)
WBC: 9.8 10*3/uL (ref 4.0–10.5)
nRBC: 0 % (ref 0.0–0.2)

## 2022-09-02 LAB — POCT ACTIVATED CLOTTING TIME
Activated Clotting Time: 245 seconds
Activated Clotting Time: 317 seconds

## 2022-09-02 LAB — MRSA NEXT GEN BY PCR, NASAL: MRSA by PCR Next Gen: NOT DETECTED

## 2022-09-02 LAB — CREATININE, SERUM
Creatinine, Ser: 0.79 mg/dL (ref 0.61–1.24)
GFR, Estimated: >60 mL/min (ref >60)

## 2022-09-02 SURGERY — CORONARY/GRAFT ACUTE MI REVASCULARIZATION
Anesthesia: Moderate Sedation

## 2022-09-02 MED ORDER — HEPARIN SODIUM (PORCINE) 5000 UNIT/ML IJ SOLN
5000.0000 [IU] | Freq: Three times a day (TID) | INTRAMUSCULAR | Status: DC
  Administered 2022-09-02 – 2022-09-05 (×8): 5000 [IU] via SUBCUTANEOUS
  Filled 2022-09-02 (×8): qty 1

## 2022-09-02 MED ORDER — ENOXAPARIN SODIUM 40 MG/0.4ML IJ SOSY: 40.0000 mg | PREFILLED_SYRINGE | INTRAMUSCULAR | Status: DC

## 2022-09-02 MED ORDER — SODIUM CHLORIDE 0.9 % WEIGHT BASED INFUSION
1.0000 mL/kg/h | INTRAVENOUS | Status: DC
  Administered 2022-09-03: 1 mL/kg/h via INTRAVENOUS

## 2022-09-02 MED ORDER — TIROFIBAN (AGGRASTAT) BOLUS VIA INFUSION
INTRAVENOUS | Status: DC | PRN
  Administered 2022-09-02: 2382.5 ug via INTRAVENOUS

## 2022-09-02 MED ORDER — FENTANYL CITRATE (PF) 100 MCG/2ML IJ SOLN
INTRAMUSCULAR | Status: DC | PRN
  Administered 2022-09-02: 25 ug via INTRAVENOUS

## 2022-09-02 MED ORDER — HEPARIN SODIUM (PORCINE) 1000 UNIT/ML IJ SOLN
INTRAMUSCULAR | Status: AC
  Filled 2022-09-02: qty 10

## 2022-09-02 MED ORDER — ACETAMINOPHEN 325 MG PO TABS: 650.0000 mg | ORAL_TABLET | ORAL | Status: DC | PRN

## 2022-09-02 MED ORDER — HYDRALAZINE HCL 20 MG/ML IJ SOLN: 10.0000 mg | INTRAMUSCULAR | Status: DC | PRN

## 2022-09-02 MED ORDER — TIROFIBAN HCL IV 12.5 MG/250 ML
INTRAVENOUS | Status: AC | PRN
  Administered 2022-09-02: .15 ug/kg/min via INTRAVENOUS

## 2022-09-02 MED ORDER — METOPROLOL TARTRATE 25 MG PO TABS
12.5000 mg | ORAL_TABLET | Freq: Two times a day (BID) | ORAL | Status: DC
  Administered 2022-09-02 – 2022-09-03 (×2): 12.5 mg via ORAL
  Filled 2022-09-02 (×2): qty 1

## 2022-09-02 MED ORDER — HEPARIN (PORCINE) IN NACL 1000-0.9 UT/500ML-% IV SOLN
INTRAVENOUS | Status: DC | PRN
  Administered 2022-09-02 (×3): 500 mL

## 2022-09-02 MED ORDER — ONDANSETRON HCL 4 MG/2ML IJ SOLN: 4.0000 mg | Freq: Four times a day (QID) | INTRAMUSCULAR | Status: DC | PRN

## 2022-09-02 MED ORDER — ONDANSETRON HCL 4 MG PO TABS: 4.0000 mg | ORAL_TABLET | Freq: Four times a day (QID) | ORAL | Status: DC | PRN

## 2022-09-02 MED ORDER — ATORVASTATIN CALCIUM 20 MG PO TABS
80.0000 mg | ORAL_TABLET | Freq: Every day | ORAL | Status: DC
  Administered 2022-09-03 – 2022-09-05 (×3): 80 mg via ORAL
  Filled 2022-09-02 (×3): qty 4

## 2022-09-02 MED ORDER — SODIUM CHLORIDE 0.9% FLUSH
3.0000 mL | Freq: Two times a day (BID) | INTRAVENOUS | Status: DC
  Administered 2022-09-03 – 2022-09-04 (×4): 3 mL via INTRAVENOUS

## 2022-09-02 MED ORDER — ACETAMINOPHEN 650 MG RE SUPP: 650.0000 mg | Freq: Four times a day (QID) | RECTAL | Status: DC | PRN

## 2022-09-02 MED ORDER — MIDAZOLAM HCL 2 MG/2ML IJ SOLN
INTRAMUSCULAR | Status: DC | PRN
  Administered 2022-09-02: 1 mg via INTRAVENOUS

## 2022-09-02 MED ORDER — LIDOCAINE HCL (PF) 1 % IJ SOLN
INTRAMUSCULAR | Status: DC | PRN
  Administered 2022-09-02: 2 mL

## 2022-09-02 MED ORDER — MORPHINE SULFATE (PF) 2 MG/ML IV SOLN: 1.0000 mg | INTRAVENOUS | Status: DC | PRN

## 2022-09-02 MED ORDER — NOREPINEPHRINE BITARTRATE 1 MG/ML IV SOLN
INTRAVENOUS | Status: DC | PRN
  Administered 2022-09-02: 15 ug/min via INTRAVENOUS

## 2022-09-02 MED ORDER — ACETAMINOPHEN 325 MG PO TABS
650.0000 mg | ORAL_TABLET | Freq: Four times a day (QID) | ORAL | Status: DC | PRN
  Administered 2022-09-03 (×2): 650 mg via ORAL
  Filled 2022-09-02 (×2): qty 2

## 2022-09-02 MED ORDER — ATROPINE SULFATE 1 MG/10ML IJ SOSY
PREFILLED_SYRINGE | INTRAMUSCULAR | Status: AC
  Filled 2022-09-02: qty 10

## 2022-09-02 MED ORDER — SERTRALINE HCL 50 MG PO TABS
100.0000 mg | ORAL_TABLET | Freq: Every day | ORAL | Status: DC
  Administered 2022-09-03 – 2022-09-05 (×3): 100 mg via ORAL
  Filled 2022-09-02 (×3): qty 2

## 2022-09-02 MED ORDER — FENTANYL CITRATE (PF) 100 MCG/2ML IJ SOLN
INTRAMUSCULAR | Status: AC
  Filled 2022-09-02: qty 2

## 2022-09-02 MED ORDER — VERAPAMIL HCL 2.5 MG/ML IV SOLN
INTRAVENOUS | Status: DC | PRN
  Administered 2022-09-02: 2.5 mg via INTRA_ARTERIAL

## 2022-09-02 MED ORDER — SODIUM CHLORIDE 0.9 % IV SOLN: 250.0000 mL | INTRAVENOUS | Status: DC | PRN

## 2022-09-02 MED ORDER — NITROGLYCERIN 1 MG/10 ML FOR IR/CATH LAB
INTRA_ARTERIAL | Status: DC | PRN
  Administered 2022-09-02: 200 ug via INTRACORONARY

## 2022-09-02 MED ORDER — NOREPINEPHRINE BITARTRATE 1 MG/ML IV SOLN
INTRAVENOUS | Status: AC
  Filled 2022-09-02: qty 4

## 2022-09-02 MED ORDER — TICAGRELOR 90 MG PO TABS
ORAL_TABLET | ORAL | Status: AC
  Filled 2022-09-02: qty 2

## 2022-09-02 MED ORDER — VERAPAMIL HCL 2.5 MG/ML IV SOLN
INTRAVENOUS | Status: AC
  Filled 2022-09-02: qty 2

## 2022-09-02 MED ORDER — SODIUM CHLORIDE 0.9 % IV SOLN
INTRAVENOUS | Status: AC | PRN
  Administered 2022-09-02 (×2): 250 mL via INTRAVENOUS

## 2022-09-02 MED ORDER — HEPARIN SODIUM (PORCINE) 1000 UNIT/ML IJ SOLN
INTRAMUSCULAR | Status: DC | PRN
  Administered 2022-09-02: 3000 [IU] via INTRAVENOUS
  Administered 2022-09-02: 8000 [IU] via INTRAVENOUS

## 2022-09-02 MED ORDER — MIRTAZAPINE 15 MG PO TABS
15.0000 mg | ORAL_TABLET | Freq: Every day | ORAL | Status: DC
  Administered 2022-09-03 – 2022-09-04 (×2): 15 mg via ORAL
  Filled 2022-09-02 (×2): qty 1

## 2022-09-02 MED ORDER — LABETALOL HCL 5 MG/ML IV SOLN: 10.0000 mg | INTRAVENOUS | Status: DC | PRN

## 2022-09-02 MED ORDER — MORPHINE SULFATE (PF) 2 MG/ML IV SOLN: 2.0000 mg | INTRAVENOUS | Status: DC | PRN

## 2022-09-02 MED ORDER — MIDAZOLAM HCL 2 MG/2ML IJ SOLN
INTRAMUSCULAR | Status: AC
  Filled 2022-09-02: qty 2

## 2022-09-02 MED ORDER — TICAGRELOR 90 MG PO TABS
90.0000 mg | ORAL_TABLET | Freq: Two times a day (BID) | ORAL | Status: DC
  Administered 2022-09-03 – 2022-09-05 (×5): 90 mg via ORAL
  Filled 2022-09-02 (×5): qty 1

## 2022-09-02 MED ORDER — ASPIRIN 81 MG PO CHEW
81.0000 mg | CHEWABLE_TABLET | Freq: Every day | ORAL | Status: DC
  Administered 2022-09-03 – 2022-09-05 (×3): 81 mg via ORAL
  Filled 2022-09-02 (×3): qty 1

## 2022-09-02 MED ORDER — LIDOCAINE HCL 1 % IJ SOLN
INTRAMUSCULAR | Status: AC
  Filled 2022-09-02: qty 20

## 2022-09-02 MED ORDER — SODIUM CHLORIDE 0.9% FLUSH: 3.0000 mL | INTRAVENOUS | Status: DC | PRN

## 2022-09-02 MED ORDER — AMPHETAMINE-DEXTROAMPHETAMINE 10 MG PO TABS
20.0000 mg | ORAL_TABLET | Freq: Two times a day (BID) | ORAL | Status: DC
  Administered 2022-09-03 – 2022-09-05 (×5): 20 mg via ORAL
  Filled 2022-09-02 (×5): qty 2

## 2022-09-02 MED ORDER — TICAGRELOR 90 MG PO TABS
ORAL_TABLET | ORAL | Status: DC | PRN
  Administered 2022-09-02: 180 mg via ORAL

## 2022-09-02 MED ORDER — HEPARIN (PORCINE) IN NACL 1000-0.9 UT/500ML-% IV SOLN
INTRAVENOUS | Status: AC
  Filled 2022-09-02: qty 1000

## 2022-09-02 SURGICAL SUPPLY — 26 items
BALLN TREK RX 2.5X12 (BALLOONS) ×1
BALLN TREK RX 3.0X15 (BALLOONS) ×1
BALLN TREK RX 3.0X20 (BALLOONS) ×1
BALLN ~~LOC~~ TREK NEO RX 3.5X20 (BALLOONS) IMPLANT
BALLOON TREK RX 2.5X12 (BALLOONS) IMPLANT
BALLOON TREK RX 3.0X15 (BALLOONS) IMPLANT
BALLOON TREK RX 3.0X20 (BALLOONS) IMPLANT
CANISTER PENUMBRA ENGINE (MISCELLANEOUS) IMPLANT
CATH 5F 110X4 TIG (CATHETERS) IMPLANT
CATH INDIGO CAT RX KIT (CATHETERS) IMPLANT
CATH INFINITI 5FR ANG PIGTAIL (CATHETERS) IMPLANT
CATH LAUNCHER 6FR JR4 (CATHETERS) IMPLANT
DEVICE RAD TR BAND REGULAR (VASCULAR PRODUCTS) IMPLANT
DRAPE BRACHIAL (DRAPES) IMPLANT
GLIDESHEATH SLEND SS 6F .021 (SHEATH) IMPLANT
GUIDEWIRE INQWIRE 1.5J.035X260 (WIRE) IMPLANT
INQWIRE 1.5J .035X260CM (WIRE) ×1
KIT ENCORE 26 ADVANTAGE (KITS) IMPLANT
PACK CARDIAC CATH (CUSTOM PROCEDURE TRAY) ×1 IMPLANT
PROTECTION STATION PRESSURIZED (MISCELLANEOUS) ×1
SET ATX SIMPLICITY (MISCELLANEOUS) IMPLANT
STATION PROTECTION PRESSURIZED (MISCELLANEOUS) IMPLANT
STENT ONYX FRONTIER 3.5X30 (Permanent Stent) IMPLANT
STENT ONYX FRONTIER 3.5X38 (Permanent Stent) IMPLANT
TUBING CIL FLEX 10 FLL-RA (TUBING) IMPLANT
WIRE ASAHI PROWATER 180CM (WIRE) IMPLANT

## 2022-09-02 NOTE — Assessment & Plan Note (Signed)
Continue sertraline with mirtazapine at bedtime

## 2022-09-02 NOTE — Assessment & Plan Note (Addendum)
Continue orders as placed by cardiologist, Dr. Herbie Baltimore: Aspirin 81 and Brilinta, Lipitor 80 mg, hydralazine and labetalol as needed BP and metoprolol 12.5 twice daily.  Patient will also be on Aggrastat for 12 hours Nitroglycerin sublingual as needed chest pain with morphine for breakthrough Cardiology consult to follow

## 2022-09-02 NOTE — H&P (Signed)
History and Physical    Patient: Shane [name] QJJ:[id number] DOB: [birth year] DOA: 09/02/2022 DOS: the patient was seen and examined on 09/02/2022 PCP: Allegra Grana, FNP  Patient coming from: Home  Chief Complaint:  Chief Complaint  Patient presents with   Chest Pain   HPI: Shane [name] is a 51 y.o. male with medical history significant of ADHD, HTN, ADD, mild asthma, and depression but not currently on any medication who is being admitted to the hospitalist service s/p cardiac cath for acute inferior STEMI.  Patient developed persistent chest pain after helping a friend move and presented by EMS. Code STEMI was called after arrival in the ED with initial troponin of 214 and EKG showing inferior ST changes and frequent PVCs.. he had emergent cardiac cath with thrombectomy and placement of 2 stents and was transferred to the stepdown unit in stable condition and chest pain-free.  He was hypotensive during the procedure, requiring Levophed which was weaned off prior to leaving the Cath Lab. Other data review: Initial labs were also significant for glucose of 148 with no prior history of diabetes and WBC of 12,500.  Chest x-ray showed no active disease.  Review of Systems: As mentioned in the history of present illness. All other systems reviewed and are negative. Past Medical History:  Diagnosis Date   ADD (attention deficit disorder)    Alcohol abuse    History of.   Anxiety    Depression    Hyperlipidemia    Hypertension    Past Surgical History:  Procedure Laterality Date   TENDON REPAIR Right 09/15/2015   Procedure: RIGHT FOREARM EXPLORATION AND CLOSURE OF LACERATION, TENDON REPAIR;  Surgeon: Bradly Bienenstock, MD;  Location: [facility] OR;  Service: Orthopedics;  Laterality: Right;   Social History:  reports that he has been smoking cigarettes. He has been smoking an average of .25 packs per day. He has never used smokeless tobacco. He reports that he does not drink alcohol and  does not use drugs.  No Known Allergies  Family History  Problem Relation Age of Onset   Prostate cancer Father    Hyperlipidemia Father    Hypertension Father    Lung cancer Maternal Grandfather    Heart attack Maternal Grandfather 38       died   Alcohol abuse Paternal Grandfather    Hypertension Cousin    Hyperlipidemia Other    Heart attack Paternal Uncle 74       died   Heart attack Maternal Great-grandfather 60   Alcohol abuse Maternal Aunt     Prior to Admission medications   Medication Sig Start Date End Date Taking? Authorizing Provider  amphetamine-dextroamphetamine (ADDERALL) 20 MG tablet Take 1 tablet (20 mg total) by mouth 2 (two) times daily. 07/05/22 08/04/22  Allegra Grana, FNP  amphetamine-dextroamphetamine (ADDERALL) 20 MG tablet Take 1 tablet (20 mg total) by mouth 2 (two) times daily. 07/17/22 08/29/22  Allegra Grana, FNP  amphetamine-dextroamphetamine (ADDERALL) 20 MG tablet Take 1 tablet (20 mg total) by mouth 2 (two) times daily. 07/05/22 08/04/22  Allegra Grana, FNP  mirtazapine (REMERON) 15 MG tablet TAKE 1 TABLET BY MOUTH AT BEDTIME. 04/02/22 04/02/23  Allegra Grana, FNP  mupirocin ointment (BACTROBAN) 2 % Apply 1 application topically 2 (two) times daily. 06/04/20   Allegra Grana, FNP  nicotine (NICODERM CQ - DOSED IN MG/24 HOURS) 14 mg/24hr patch Place 1 patch (14 mg total) onto the skin daily. 07/05/22   Arnett,  Lyn Records, FNP  sertraline (ZOLOFT) 100 MG tablet TAKE 1 TABLET BY MOUTH DAILY 04/02/22 04/02/23  Allegra Grana, FNP    Physical Exam: Vitals:   09/02/22 2104 09/02/22 2109 09/02/22 2114 09/02/22 2119  BP: 109/83 112/81 113/80 113/80  Pulse: 67 71  (!) 0  Resp: 16 15    Temp:      TempSrc:      SpO2: 99% 98% 98%    Physical Exam Vitals and nursing note reviewed.  Constitutional:      General: He is not in acute distress. HENT:     Head: Normocephalic and atraumatic.  Cardiovascular:     Rate and Rhythm: Normal  rate and regular rhythm.     Heart sounds: Normal heart sounds.  Pulmonary:     Effort: Pulmonary effort is normal.     Breath sounds: Normal breath sounds.  Abdominal:     Palpations: Abdomen is soft.     Tenderness: There is no abdominal tenderness.  Neurological:     Mental Status: Mental status is at baseline.     Data Reviewed: Relevant notes from primary care and specialist visits, past discharge summaries as available in EHR, including Care Everywhere. Prior diagnostic testing as pertinent to current admission diagnoses Updated medications and problem lists for reconciliation ED course, including vitals, labs, imaging, treatment and response to treatment Triage notes, nursing and pharmacy notes and ED provider's notes Notable results as noted in HPI   Assessment and Plan: * Acute ST elevation myocardial infarction (STEMI) of inferolateral wall (HCC) Continue orders as placed by cardiologist, Dr. Herbie Baltimore: Aspirin 81 and Brilinta, Lipitor 80 mg, hydralazine and labetalol as needed BP and metoprolol 12.5 twice daily.  Patient will also be on Aggrastat for 12 hours Nitroglycerin sublingual as needed chest pain with morphine for breakthrough Cardiology consult to follow  HTN (hypertension) BP is controlled Patient has been started on metoprolol for STEMI  Depression Continue sertraline with mirtazapine at bedtime  Mild persistent asthma without complication Not exacerbated.  Albuterol as needed  ADD (attention deficit disorder) Holding Adderall for now      Advance Care Planning:   Code Status: Full Code   Consults: [facility] cardiology, Dr. Duke Salvia  Family Communication:   Severity of Illness: The appropriate patient status for this patient is INPATIENT. Inpatient status is judged to be reasonable and necessary in order to provide the required intensity of service to ensure the patient's safety. The patient's presenting symptoms, physical exam findings, and initial  radiographic and laboratory data in the context of their chronic comorbidities is felt to place them at high risk for further clinical deterioration. Furthermore, it is not anticipated that the patient will be medically stable for discharge from the hospital within 2 midnights of admission.   * I certify that at the point of admission it is my clinical judgment that the patient will require inpatient hospital care spanning beyond 2 midnights from the point of admission due to high intensity of service, high risk for further deterioration and high frequency of surveillance required.*  Author: Andris Baumann, MD 09/02/2022 10:29 PM  For on call review [email or web address].

## 2022-09-02 NOTE — Progress Notes (Signed)
Spoke with Dr. Herbie Baltimore concerning troponin > 24,000 and intermittent runs of v-tach. No new orders at this time.

## 2022-09-02 NOTE — Assessment & Plan Note (Signed)
BP is controlled Patient has been started on metoprolol for STEMI

## 2022-09-02 NOTE — ED Triage Notes (Signed)
Chest tightness after activity. Attempted rest and no relief. C/o chest pain and radiating down left arm. EMS started 2 PIVs. 1 spray of nitro administered. 324 asa.

## 2022-09-02 NOTE — Consult Note (Signed)
Cardiology Consultation   Patient ID: [name] MRN: [id number]; DOB: 08-Jun-1971  Admit date: 09/02/2022 Date of Consult: 09/02/2022  PCP:  Allegra Grana, FNP   [facility] Providers Cardiologist:  None        Patient Profile:   Shane [name] is a 51 y.o. male with a hx of ADHD and depression who is being seen 09/02/2022 for the evaluation of code STEMI at the request of EMS.  History of Present Illness:   Shane [name] is a 51 year old gentleman who was in his usual state of health until roughly 4:00 this afternoon.  He been helping his friend move, shortly after completing the move at roughly 4:00 in the afternoon he started having some chest discomfort.  He went home to rest and it did not go away.  At 615 he called his mother.  They discussed it for a little while they decided to call EMS at roughly 645.  Upon MS arrival, he was having 8 out of 10 pain with some nausea and belching.  EKG revealed ST elevations with PVCs.  Code STEMI called and the patient was brought emergently to [facility] ER where he was evaluated with an EKG and brought directly to the cardiac catheterization lab for urgent PCI.    He has had a few skipped beats but no rapid irregular heartbeats or palpitations.  No syncope or near STEMI.  No PND, orthopnea edema. All the except the chest pain today has been stable from a car standpoint.  Has not had any antecedent chest pain.  Past Medical History:  Diagnosis Date   ADD (attention deficit disorder)    Alcohol abuse    History of.   Anxiety    Depression    Hyperlipidemia    Hypertension     Past Surgical History:  Procedure Laterality Date   TENDON REPAIR Right 09/15/2015   Procedure: RIGHT FOREARM EXPLORATION AND CLOSURE OF LACERATION, TENDON REPAIR;  Surgeon: Bradly Bienenstock, MD;  Location: [facility] OR;  Service: Orthopedics;  Laterality: Right;     Home Medications:  Prior to Admission medications   Medication Sig Start Date End  Date Taking? Authorizing Provider  amphetamine-dextroamphetamine (ADDERALL) 20 MG tablet Take 1 tablet (20 mg total) by mouth 2 (two) times daily. 07/05/22 08/04/22  Allegra Grana, FNP  amphetamine-dextroamphetamine (ADDERALL) 20 MG tablet Take 1 tablet (20 mg total) by mouth 2 (two) times daily. 07/17/22 08/29/22  Allegra Grana, FNP  amphetamine-dextroamphetamine (ADDERALL) 20 MG tablet Take 1 tablet (20 mg total) by mouth 2 (two) times daily. 07/05/22 08/04/22  Allegra Grana, FNP  mirtazapine (REMERON) 15 MG tablet TAKE 1 TABLET BY MOUTH AT BEDTIME. 04/02/22 04/02/23  Allegra Grana, FNP  mupirocin ointment (BACTROBAN) 2 % Apply 1 application topically 2 (two) times daily. 06/04/20   Allegra Grana, FNP  nicotine (NICODERM CQ - DOSED IN MG/24 HOURS) 14 mg/24hr patch Place 1 patch (14 mg total) onto the skin daily. 07/05/22   Allegra Grana, FNP  sertraline (ZOLOFT) 100 MG tablet TAKE 1 TABLET BY MOUTH DAILY 04/02/22 04/02/23  Allegra Grana, FNP    Inpatient Medications: Scheduled Meds:  Continuous Infusions:  PRN Meds:   Allergies:   No Known Allergies  Social History:   Social History   Socioeconomic History   Marital status: Divorced    Spouse name: Shane [name]   Number of children: 2   Years of education: 14   Highest education level:  Not on file  Occupational History    Comment: [employer]  Tobacco Use   Smoking status: Every Day    Packs/day: 0.25    Types: Cigarettes   Smokeless tobacco: Never  Substance and Sexual Activity   Alcohol use: No    Comment: no alcohol since 12/16/2012   Drug use: No   Sexual activity: Yes    Partners: Female  Other Topics Concern   Not on file  Social History Narrative   Divorced in 08/2020; they have  two daughters (22 and 36 )    In the past-Rebuilds induction coils for work   05/2022- started [employer]   Social Determinants of Health   Financial Resource Strain: Not on file  Food  Insecurity: Not on file  Transportation Needs: Not on file  Physical Activity: Not on file  Stress: Not on file  Social Connections: Not on file  Intimate Partner Violence: Not on file    Family History:   Reviewed Family History  Problem Relation Age of Onset   Prostate cancer Father    Hyperlipidemia Father    Hypertension Father    Lung cancer Maternal Grandfather    Heart attack Maternal Grandfather 02/02/2065       died   Alcohol abuse Paternal Grandfather    Hypertension Cousin    Hyperlipidemia Other    Heart attack Paternal Uncle 24       died   Heart attack Maternal Great-grandfather 23   Alcohol abuse Maternal Aunt      ROS:  Please see the history of present illness.  He did have some burping and nausea earlier today, but not currently. Mildly sweaty but not necessarily any other untoward symptoms.  Not necessarily dyspneic. Has otherwise been healthy. All other ROS reviewed and negative.     Physical Exam/Data:   Vitals:   09/02/22 1936  BP: (!) 148/95  Pulse: (!) 101  Resp: 16  Temp: (!) 97.5 F (36.4 C)  TempSrc: Oral  SpO2: 100%   No intake or output data in the 24 hours ending 09/02/22 1947    07/05/2022    3:43 PM 04/02/2022    2:59 PM 01/01/2022    2:46 PM  Last 3 Weights  Weight (lbs) 208 lb 6.4 oz 209 lb 211 lb 3.2 oz  Weight (kg) 94.53 kg 94.802 kg 95.8 kg     There is no height or weight on file to calculate BMI.  General:  Well nourished, well developed, in moderate acute distress-6/10 chest pain HEENT: normal Neck: no JVD Vascular: No carotid bruits; Distal pulses 2+ bilaterally Cardiac:  normal S1, S2; RRR; no murmur rubs or gallops Lungs:  clear to auscultation bilaterally, no wheezing, rhonchi or rales; labored Abd: soft, nontender, no hepatomegaly  Ext: no edema Musculoskeletal:  No deformities, BUE and BLE strength normal and equal Skin: warm and dry  Neuro:  CNs 2-12 intact, no focal abnormalities noted Psych:  Normal affect    EKG:  The EKG was personally reviewed and demonstrates: Sinus rhythm with 2 to 3 mm ST elevations in lead II as well as 4 to 5 mm ST elevations in III, aVF as well as V3.  Subtle ST elevations in V4 with ST depressions in 1, aVL and V2.  Interesting locations but likely consistent with inferior lateral STEMI. Telemetry:  Telemetry was personally reviewed and demonstrates: Sinus rhythm/sinus tachycardia  Relevant CV Studies: None  Laboratory Data:  High Sensitivity Troponin:  No  results for input(s): "TROPONINIHS" in the last 720 hours.   ChemistryNo results for input(s): "NA", "K", "CL", "CO2", "GLUCOSE", "BUN", "CREATININE", "CALCIUM", "MG", "GFRNONAA", "GFRAA", "ANIONGAP" in the last 168 hours.  No results for input(s): "PROT", "ALBUMIN", "AST", "ALT", "ALKPHOS", "BILITOT" in the last 168 hours. Lipids No results for input(s): "CHOL", "TRIG", "HDL", "LABVLDL", "LDLCALC", "CHOLHDL" in the last 168 hours.  HematologyNo results for input(s): "WBC", "RBC", "HGB", "HCT", "MCV", "MCH", "MCHC", "RDW", "PLT" in the last 168 hours. Thyroid No results for input(s): "TSH", "FREET4" in the last 168 hours.  BNPNo results for input(s): "BNP", "PROBNP" in the last 168 hours.  DDimer No results for input(s): "DDIMER" in the last 168 hours.   Radiology/Studies:  No results found.   Assessment and Plan:   Acute inferior (inferolateral STEMI)  Plan emergent cardiac catheterization with possible PCI.  Further plans post cath. Cycle troponins 2D echo in the morning Beta-blocker, statin  Further plans based on cath report.  Patient will be admitted to hospitalist service with cardiology consulting.       :[id number]}   TIMI Risk Score for ST  Elevation MI:   The patient's TIMI risk score is 0, which indicates a 0.8% risk of all cause mortality at 30 days.        For questions or updates, please contact [facility] Please consult [email or web address] for contact info under     Signed, Bryan Lemma, MD  09/02/2022 7:47 PM

## 2022-09-02 NOTE — ED Notes (Signed)
ED Provider at bedside. 

## 2022-09-02 NOTE — ED Notes (Signed)
Pt departing with cath team to cath lab

## 2022-09-02 NOTE — ED Notes (Signed)
Chest tightness after activity. Attempted rest and no relief. C/o chest pain and radiating down left arm. EMS started 2 PIVs. 1 spray of nitro administered. 324 asa.   

## 2022-09-02 NOTE — ED Provider Notes (Signed)
[facility] Provider Note    Event Date/Time   First MD Initiated Contact with Patient 09/02/22 1933     (approximate)   History   Chief Complaint Chest Pain   HPI  Shane [name] is a 51 y.o. male with past medical history of ADHD and depression who presents to the ED complaining of chest pain.  Patient reports that he had onset of tightness and squeezing pain in the center of his chest around 4 PM this afternoon while helping a friend move.  Pain remained constant since then, waxing and waning in severity, did not seem to get better after he went home to rest.  He eventually called his mother and in further discussion, decided to call EMS.  He denies any associated nausea, vomiting, difficulty breathing, fever, or cough.  He denies any significant cardiac history.  On EMS arrival, EKG was found to have PVCs with ST elevation inferiorly.     Physical Exam   Triage Vital Signs: ED Triage Vitals [09/02/22 1936]  Enc Vitals Group     BP (!) 148/95     Pulse Rate (!) 101     Resp 16     Temp (!) 97.5 F (36.4 C)     Temp Source Oral     SpO2 100 %     Weight      Height      Head Circumference      Peak Flow      Pain Score      Pain Loc      Pain Edu?      Excl. in GC?     Most recent vital signs: Vitals:   09/02/22 1936 09/02/22 1950  BP: (!) 148/95   Pulse: (!) 101   Resp: 16   Temp: (!) 97.5 F (36.4 C)   SpO2: 100% 99%    Constitutional: Alert and oriented. Eyes: Conjunctivae are normal. Head: Atraumatic. Nose: No congestion/rhinnorhea. Mouth/Throat: Mucous membranes are moist.  Cardiovascular: Normal rate, regular rhythm. Grossly normal heart sounds.  2+ radial pulses bilaterally. Respiratory: Normal respiratory effort.  No retractions. Lungs CTAB. Gastrointestinal: Soft and nontender. No distention. Musculoskeletal: No lower extremity tenderness nor edema.  Neurologic:  Normal speech and language. No gross focal  neurologic deficits are appreciated.    ED Results / Procedures / Treatments   Labs (all labs ordered are listed, but only abnormal results are displayed) Labs Reviewed  CBC WITH DIFFERENTIAL/PLATELET - Abnormal; Notable for the following components:      Result Value   WBC 12.5 (*)    Neutro Abs 11.2 (*)    All other components within normal limits  BASIC METABOLIC PANEL - Abnormal; Notable for the following components:   Glucose, Bld 148 (*)    All other components within normal limits  TROPONIN I (HIGH SENSITIVITY) - Abnormal; Notable for the following components:   Troponin I (High Sensitivity) 214 (*)    All other components within normal limits  POCT ACTIVATED CLOTTING TIME  POCT ACTIVATED CLOTTING TIME     EKG  ED ECG REPORT I, Chesley Noon, the attending physician, personally viewed and interpreted this ECG.   Date: 09/02/2022  EKG Time: 19:04  Rate: 106  Rhythm: sinus tachycardia, occasional PVC noted, unifocal  Axis: Normal  Intervals:none  ST&T Change: ST elevation inferiorly and anteriorly with depressions laterally  PROCEDURES:  Critical Care performed: Yes, see critical care procedure note(s)  .Critical Care  Performed by: Larinda Buttery,  Juanda Crumble, MD Authorized by: Blake Divine, MD   Critical care provider statement:    Critical care time (minutes):  30   Critical care time was exclusive of:  Separately billable procedures and treating other patients and teaching time   Critical care was necessary to treat or prevent imminent or life-threatening deterioration of the following conditions:  Cardiac failure   Critical care was time spent personally by me on the following activities:  Development of treatment plan with patient or surrogate, discussions with consultants, evaluation of patient's response to treatment, examination of patient, ordering and review of laboratory studies, ordering and review of radiographic studies, ordering and performing treatments  and interventions, pulse oximetry, re-evaluation of patient's condition and review of old charts   I assumed direction of critical care for this patient from another provider in my specialty: no     Care discussed with: admitting provider      [facility]: Medications  fentaNYL (SUBLIMAZE) injection (25 mcg Intravenous Given 09/02/22 1954)  midazolam (VERSED) injection (1 mg Intravenous Given 09/02/22 1954)  lidocaine (PF) (XYLOCAINE) 1 % injection (2 mLs Infiltration Given 09/02/22 1957)  verapamil (ISOPTIN) injection (2.5 mg Intra-arterial Given 09/02/22 1957)  Heparin (Porcine) in NaCl 1000-0.9 UT/500ML-% SOLN (500 mLs  Given 09/02/22 2030)  heparin sodium (porcine) injection (3,000 Units Intravenous Given 09/02/22 2015)  ticagrelor (BRILINTA) tablet (180 mg Oral Given 09/02/22 2004)  tirofiban (AGGRASTAT) bolus via infusion (2,382.5 mcg Intravenous Given 09/02/22 2013)  0.9 %  sodium chloride infusion (250 mLs Intravenous New Bag/Given 09/02/22 2051)  tirofiban (AGGRASTAT) infusion 50 mcg/mL 250 mL (0.15 mcg/kg/min  95.3 kg (Order-Specific) Intravenous New Bag/Given 09/02/22 2018)  norepinephrine (LEVOPHED) 4 mg in dextrose 5 % 250 mL (0.016 mg/mL) infusion (15 mcg/min Intravenous New Bag/Given 09/02/22 2027)  nitroGLYCERIN 1 mg/10 mL (100 mcg/mL) - IR/CATH LAB (200 mcg Intracoronary Given 09/02/22 2050)     IMPRESSION / MDM / [facility] / ED COURSE  I reviewed the triage vital signs and the nursing notes.                              51 y.o. male with past medical history of ADHD and depression who presents to the ED complaining of onset tight and squeezing pain in his chest this afternoon around 4 PM while helping a friend move, has remained constant since then.   Patient's presentation is most consistent with acute presentation with potential threat to life or bodily function.  Differential diagnosis includes, but is not limited to, ACS, arrhythmia, PE,  pneumonia, pneumothorax, dissection, musculoskeletal pain, GERD, and anxiety.  Patient well-appearing and in no acute distress, vital signs remarkable for tachycardia but otherwise reassuring.  Prehospital EKG concerning for STEMI with inferior ST elevation and reciprocal changes laterally.  Code STEMI activated by EMS and cardiology at the bedside on patient's arrival to the ED.  Decision was made to proceed directly to the Cath Lab for intervention, patient remained in stable condition at that time.      FINAL CLINICAL IMPRESSION(S) / ED DIAGNOSES   Final diagnoses:  ST elevation myocardial infarction (STEMI), unspecified artery ([place])     Rx / DC Orders   ED Discharge Orders     None        Note:  This document was prepared using Dragon voice recognition software and may include unintentional dictation errors.   Blake Divine, MD 09/02/22 [phone]

## 2022-09-02 NOTE — ED Notes (Signed)
Dr. Herbie Baltimore at bedside on arrival of pt to ED.

## 2022-09-02 NOTE — Assessment & Plan Note (Signed)
Holding Adderall for now

## 2022-09-02 NOTE — Assessment & Plan Note (Signed)
Not exacerbated.  Albuterol as needed

## 2022-09-03 ENCOUNTER — Telehealth (HOSPITAL_COMMUNITY): Payer: Self-pay

## 2022-09-03 ENCOUNTER — Other Ambulatory Visit (HOSPITAL_COMMUNITY): Payer: Self-pay

## 2022-09-03 ENCOUNTER — Inpatient Hospital Stay (HOSPITAL_COMMUNITY)
Admit: 2022-09-03 | Discharge: 2022-09-03 | Disposition: A | Discharge status: Home / Self Care | Payer: BC Managed Care – PPO | Attending: Cardiology | Admitting: Cardiology

## 2022-09-03 ENCOUNTER — Encounter: Payer: Self-pay | Admitting: Cardiology

## 2022-09-03 DIAGNOSIS — Z9861 Coronary angioplasty status: Secondary | ICD-10-CM

## 2022-09-03 DIAGNOSIS — I2119 ST elevation (STEMI) myocardial infarction involving other coronary artery of inferior wall: Secondary | ICD-10-CM | POA: Diagnosis not present

## 2022-09-03 LAB — BASIC METABOLIC PANEL
Anion gap: 7 (ref 5–15)
BUN: 15 mg/dL (ref 6–20)
CO2: 23 mmol/L (ref 22–32)
Calcium: 8.5 mg/dL — ABNORMAL LOW (ref 8.9–10.3)
Chloride: 106 mmol/L (ref 98–111)
Creatinine, Ser: 0.68 mg/dL (ref 0.61–1.24)
GFR, Estimated: >60 mL/min (ref >60)
Glucose, Bld: 140 mg/dL — ABNORMAL HIGH (ref 70–99)
Potassium: 3.7 mmol/L (ref 3.5–5.1)
Sodium: 136 mmol/L (ref 135–145)

## 2022-09-03 LAB — ECHOCARDIOGRAM COMPLETE
AR max vel: 2.54 cm2
AV Area VTI: 2.3 cm2
AV Area mean vel: 2.43 cm2
AV Mean grad: 3 mmHg
AV Peak grad: 4.3 mmHg
Ao pk vel: 1.04 m/s
Area-P 1/2: 4.44 cm2
S' Lateral: 3.7 cm
Weight: 3294.55 oz

## 2022-09-03 LAB — CBC
HCT: 46.2 % (ref 39.0–52.0)
Hemoglobin: 15 g/dL (ref 13.0–17.0)
MCH: 32.2 pg (ref 26.0–34.0)
MCHC: 32.5 g/dL (ref 30.0–36.0)
MCV: 99.1 fL (ref 80.0–100.0)
Platelets: 279 10*3/uL (ref 150–400)
RBC: 4.66 MIL/uL (ref 4.22–5.81)
RDW: 13.2 % (ref 11.5–15.5)
WBC: 9.2 10*3/uL (ref 4.0–10.5)
nRBC: 0 % (ref 0.0–0.2)

## 2022-09-03 MED ORDER — AMIODARONE LOAD VIA INFUSION
150.0000 mg | Freq: Once | INTRAVENOUS | Status: AC
  Administered 2022-09-03: 150 mg via INTRAVENOUS
  Filled 2022-09-03: qty 83.34

## 2022-09-03 MED ORDER — AMIODARONE HCL IN DEXTROSE 360-4.14 MG/200ML-% IV SOLN: 30.0000 mg/h | INTRAVENOUS | Status: DC

## 2022-09-03 MED ORDER — IOHEXOL 300 MG/ML  SOLN
INTRAMUSCULAR | Status: DC | PRN
  Administered 2022-09-02: 85 mL

## 2022-09-03 MED ORDER — MORPHINE SULFATE (PF) 2 MG/ML IV SOLN: 1.0000 mg | INTRAVENOUS | Status: DC | PRN

## 2022-09-03 MED ORDER — TIROFIBAN HCL IV 12.5 MG/250 ML
INTRAVENOUS | Status: AC
  Filled 2022-09-03: qty 250

## 2022-09-03 MED ORDER — AMIODARONE HCL IN DEXTROSE 360-4.14 MG/200ML-% IV SOLN
60.0000 mg/h | INTRAVENOUS | Status: DC
  Administered 2022-09-03 (×2): 60 mg/h via INTRAVENOUS
  Filled 2022-09-03 (×2): qty 200

## 2022-09-03 MED ORDER — METOPROLOL TARTRATE 25 MG PO TABS
25.0000 mg | ORAL_TABLET | Freq: Two times a day (BID) | ORAL | Status: AC
  Administered 2022-09-03 – 2022-09-04 (×3): 25 mg via ORAL
  Filled 2022-09-03 (×3): qty 1

## 2022-09-03 NOTE — Progress Notes (Signed)
Rounding Note    Patient Name: Shane [name] Date of Encounter: 09/03/2022  [facility] Cardiologist: Bryan Lemma, MD   Subjective   He had recurrent nonsustained ventricular tachycardia overnight the longest was 15 beats.  He was placed on amiodarone drip which is off this morning.  He complains of persistent mild soreness in the chest.  Inpatient Medications    Scheduled Meds:  amphetamine-dextroamphetamine  20 mg Oral BID   aspirin  81 mg Oral Daily   atorvastatin  80 mg Oral Daily   heparin  5,000 Units Subcutaneous Q8H   metoprolol tartrate  25 mg Oral BID   mirtazapine  15 mg Oral QHS   sertraline  100 mg Oral Daily   sodium chloride flush  3 mL Intravenous Q12H   ticagrelor  90 mg Oral BID   Continuous Infusions:  sodium chloride     PRN Meds: sodium chloride, acetaminophen **OR** acetaminophen, morphine injection, ondansetron **OR** ondansetron (ZOFRAN) IV, sodium chloride flush   Vital Signs    Vitals:   09/03/22 0500 09/03/22 0600 09/03/22 0800 09/03/22 0900  BP: (!) 118/93 122/87 (!) 112/91 (!) 124/94  Pulse: 70 71 67 72  Resp: 13 15 16 17   Temp:   97.8 F (36.6 C)   TempSrc:   Oral   SpO2: 95% 95% 95% 96%  Weight:        Intake/Output Summary (Last 24 hours) at 09/03/2022 1150 Last data filed at 09/03/2022 0927 Gross per 24 hour  Intake 3625.48 ml  Output 450 ml  Net 3175.48 ml      09/02/2022   10:00 PM 07/05/2022    3:43 PM 04/02/2022    2:59 PM  Last 3 Weights  Weight (lbs) 205 lb 14.6 oz 208 lb 6.4 oz 209 lb  Weight (kg) 93.4 kg 94.53 kg 94.802 kg      Telemetry    He is in sinus rhythm this morning.  Recurrent nonsustained ventricular tachycardia overnight- Personally Reviewed  ECG     - Personally Reviewed  Physical Exam   GEN: No acute distress.   Neck: No JVD Cardiac: RRR, no murmurs, rubs, or gallops.  Respiratory: Clear to auscultation bilaterally. GI: Soft, nontender, non-distended  MS: No edema; No  deformity. Neuro:  Nonfocal  Psych: Normal affect  Right radial pulses normal with no hematoma.  Labs    High Sensitivity Troponin:   Recent Labs  Lab 09/02/22 1936 09/02/22 2156  TROPONINIHS 214* >24,000*     Chemistry Recent Labs  Lab 09/02/22 1936 09/02/22 2156 09/03/22 0439  NA 137  --  136  K 4.1  --  3.7  CL 104  --  106  CO2 24  --  23  GLUCOSE 148*  --  140*  BUN 14  --  15  CREATININE 0.92 0.79 0.68  CALCIUM 9.1  --  8.5*  GFRNONAA >60 >60 >60  ANIONGAP 9  --  7    Lipids No results for input(s): "CHOL", "TRIG", "HDL", "LABVLDL", "LDLCALC", "CHOLHDL" in the last 168 hours.  Hematology Recent Labs  Lab 09/02/22 1936 09/02/22 2156 09/03/22 0439  WBC 12.5* 9.8 9.2  RBC 5.10 4.75 4.66  HGB 16.4 15.7 15.0  HCT 49.4 45.5 46.2  MCV 96.9 95.8 99.1  MCH 32.2 33.1 32.2  MCHC 33.2 34.5 32.5  RDW 12.8 12.8 13.2  PLT 290 262 279   Thyroid No results for input(s): "TSH", "FREET4" in the last 168 hours.  BNPNo  results for input(s): "BNP", "PROBNP" in the last 168 hours.  DDimer No results for input(s): "DDIMER" in the last 168 hours.   Radiology    CARDIAC CATHETERIZATION  Addendum Date: 09/03/2022     Prox RCA to Mid RCA lesion is 100% stenosed.   Balloon angioplasty was performed using a BALLN TREK RX 3.0X20.   Aspiration thrombectomy performed using Penumbra catheter   A drug-eluting stent was successfully placed using a STENT ONYX FRONTIER 3.5X38.  Postdilated to 3.6 mm   A 2nd drug-eluting stent was successfully placed overlapping the previous stent proximally to cover the entire lesion, using a STENT ONYX FRONTIER 3.5X30.  Postdilated to 3.65 mm   Post intervention, there is a 0% residual stenosis for both stents.   -------------------------   Mid RCA to Dist RCA lesion is 95% stenosed with 95% stenosed side branch in RPAV.   Balloon angioplasty was performed using a BALLN TREK RX 3.0X20 -with minimal benefit.   Aspiration thrombectomy performed using Penumbra  catheter from the distal RCA although into the PL branch restoring TIMI-3 flow.   Post intervention, there is a 0% residual stenosis. Post intervention, the side branch was reduced to 0% residual stenosis.   1st RPL lesion is 100% stenosed.  RPDA lesion is 100% stenosed -slow flow to the apex because of distal embolization.   -------------------------   Normal Left Coronary System   There is mild to moderate left ventricular systolic dysfunction.  The left ventricular ejection fraction is 45-50% by visual estimate -> basal to mid inferior wall severe hypo to akinesis   LV end diastolic pressure is moderately elevated.-24 mmHg   There is no aortic valve stenosis. POST-CATH DIAGNOSES Acute Inferolateral STEMI-suspect possible RV involvement with mildly reduced EF and basal to mid inferior akinesis to severe hypokinesis.  EF estimated roughly 45% CULPRIT Lesion is 100% near still RCA heavily thrombotic occlusion with extensive thrombus throughout the entire RCA requiring Penumbra thrombectomy to both the proximal and distal portion of the vessel finally restoring TIMI-3 flow to all but the very apical portions of the RPL and PDA. => The proximal to mid will disease vessel had received treated with 2 overlapping Onyx Frontier DES Stents (3.5 mm 38 mm and 3.5 mm x 30 mm with roughly 2 to 3 mm overlap. Stents were post-dilated to 3.65 mm) => TIMI 0 flow reduced to TIMI-3 flow with 0% residual stenosis Angiographically normal Left Coronary System with a non--dominant Cx and a Large Ramus Intermedius that is the same size as it is a wraparound LAD. Temporary hypotension not unexpected with RV infarct, responded well to 2 500 mL boluses and short run of Levophed that was reduced and weaned off prior to leaving the Cath Lab.  Blood pressures were in the 110/80 range. RECOMMENDATIONS Admit to ICU-we will continue Aggrastat for 12 hours post PCI Check 2D echo in the morning-(already ordered) Cycle troponins with troponin level  in the morning. We will start low-dose metoprolol 12.5 mg twice daily-titrate if possible. High-dose statin-check lipid panel in the morning. Smoke cessation counseling-he says that he will quit smoking. Bryan Lemmaavid Harding, MD  Result Date: 09/03/2022   Prox RCA to Mid RCA lesion is 100% stenosed.   Balloon angioplasty was performed using a BALLN TREK RX 3.0X20.   Aspiration thrombectomy performed using Penumbra catheter   A drug-eluting stent was successfully placed using a STENT ONYX FRONTIER 3.5X38.  Postdilated to 3.6 mm   A 2nd drug-eluting stent was successfully placed overlapping  the previous stent proximally to cover the entire lesion, using a STENT ONYX FRONTIER 3.5X30.  Postdilated to 3.65 mm   Post intervention, there is a 0% residual stenosis for both stents.   -------------------------   Mid RCA to Dist RCA lesion is 95% stenosed with 95% stenosed side branch in RPAV.   Balloon angioplasty was performed using a BALLN TREK RX 3.0X20 -with minimal benefit.   Aspiration thrombectomy performed using Penumbra catheter from the distal RCA although into the PL branch restoring TIMI-3 flow.   Post intervention, there is a 0% residual stenosis. Post intervention, the side branch was reduced to 0% residual stenosis.   1st RPL lesion is 100% stenosed.  RPDA lesion is 100% stenosed -slow flow to the apex because of distal embolization.   -------------------------   Normal Left Coronary System   There is mild to moderate left ventricular systolic dysfunction.  The left ventricular ejection fraction is 45-50% by visual estimate -> basal to mid inferior wall severe hypo to akinesis   LV end diastolic pressure is moderately elevated.-24 mmHg   There is no aortic valve stenosis. POST-CATH DIAGNOSES Acute Inferolateral STEMI-suspect possible RV involvement with mildly reduced EF and basal to mid inferior akinesis to severe hypokinesis.  EF estimated roughly 45% CULPRIT Lesion is 100% near still RCA heavily thrombotic  occlusion with extensive thrombus throughout the entire RCA requiring Penumbra thrombectomy to both the proximal and distal portion of the vessel finally restoring TIMI-3 flow to all but the very apical portions of the RPL and PDA. => The proximal to mid will disease vessel had received treated with 2 overlapping Onyx Frontier DES Stents (3.5 mm 38 mm and 3.5 mm x 30 mm with roughly 2 to 3 mm overlap. Stents were post-dilated to 3.65 mm) => TIMI 0 flow reduced to TIMI-3 flow with 0% residual stenosis Angiographically normal Left Coronary System with a non--dominant Cx and a Large Ramus Intermedius that is the same size as it is a wraparound LAD. Temporary hypotension not unexpected with RV infarct, responded well to 2 500 mL boluses and short run of Levophed that was reduced and weaned off prior to leaving the Cath Lab.  Blood pressures were in the 110/80 range. RECOMMENDATIONS Admit to ICU-we will continue Aggrastat for 12 hours post PCI Check 2D echo in the morning-(already ordered) Cycle troponins with troponin level in the morning. We will start low-dose metoprolol 12.5 mg twice daily-titrate if possible. High-dose statin-check lipid panel in the morning. Smoke cessation counseling-he says that he will quit smoking. Bryan Lemma, MD  DG Chest Portable 1 View  Result Date: 09/02/2022 CLINICAL DATA:  Chest pain. STEMI earlier today. Status post cardiac catheterization. EXAM: PORTABLE CHEST 1 VIEW COMPARISON:  Chest x-ray dated Mar 01, 2021. FINDINGS: The heart size and mediastinal contours are within normal limits. Both lungs are clear. The visualized skeletal structures are unremarkable. IMPRESSION: No active disease. Electronically Signed   By: Obie Dredge M.D.   On: 09/02/2022 22:05    Cardiac Studies   I personally reviewed his echocardiogram which was being done at the bedside.  Preliminary, ejection fraction appears to be moderately reduced at 35% with severe inferior wall hypokinesis.  RV  appears to be of normal size with normal function.  Patient Profile     51 y.o. male with history of ADHD and tobacco use who presented with inferior ST elevation myocardial infarction.  Assessment & Plan    1.  Inferior ST elevation myocardial infarction: I  personally reviewed his cardiac cath images which showed thrombotic occlusion of the right coronary artery with massive amount of thrombus.  This was treated successfully with aspiration thrombectomy and 2 overlapped drug-eluting stents with excellent results.  There was slow flow in the vessel which was likely due to distal embolization. Continue dual antiplatelet therapy with aspirin and ticagrelor for at least 12 months. Recommend aggressive treatment of risk factors and cardiac rehab. He is to stay off work for a minimal of 2 weeks.  2.  Nonsustained ventricular tachycardia: In the setting of myocardial infarction.  I agree with discontinuation of amiodarone.  I increased metoprolol to tartrate to 25 mg twice daily.  Recommend switching this to Toprol before hospital discharge.  3.  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  4.  Tobacco use: Cessation advised.  If the patient remained stable today, he can likely be transferred to telemetry in the afternoon.  Total encounter time more than 50 minutes. Greater than 50% was spent in counseling and coordination of care with the patient.       For questions or updates, please contact [facility] Please consult [email or web address] for contact info under        Signed, Lorine Bears, MD  09/03/2022, 11:50 AM

## 2022-09-03 NOTE — TOC Benefit Eligibility Note (Signed)
Patient Product/process development scientist completed.    The patient is currently admitted and upon discharge could be taking BRILINTA 90 MG TAB.  The current 30 day co-pay is $105.65.   The patient is insured through [employer]    Haze Rushing, CPhT Pharmacy Patient Advocate Specialist Direct Number: [phone] Fax: [phone]

## 2022-09-03 NOTE — Progress Notes (Signed)
   09/03/22 0800  Clinical Encounter Type  Visited With Patient  Visit Type Initial  Referral From Nurse  Consult/Referral To Chaplain   Chaplain responded to code stemi. Patient in cath lab. Chaplain services are available for follow up as needed.

## 2022-09-03 NOTE — Progress Notes (Signed)
Called overnight ~ 11PM for recurrent runs of NSVT -- Started IV Amiodarone Load.  Bryan Lemma, MD

## 2022-09-03 NOTE — Progress Notes (Signed)
[facility]  - [place] [facility]   PATIENT NAME: Shane [name]    MR#:  [id number]  DATE OF BIRTH:  January 21, 1971  SUBJECTIVE:  no family at bedside. Patient earlier had some chest pain now chest pain-free. Trying to eat some fruit during my evaluation. Overall tells me this was an eye-opening for him.     VITALS:  Blood pressure (!) 142/99, pulse 91, temperature 97.8 F (36.6 C), temperature source Oral, resp. rate 18, weight 93.4 kg, SpO2 95 %.  PHYSICAL EXAMINATION:   GENERAL:  51 y.o.-year-old patient lying in the bed with no acute distress.  LUNGS: Normal breath sounds bilaterally, no wheezing CARDIOVASCULAR: S1, S2 normal. No murmurs,   ABDOMEN: Soft, nontender, nondistended. Bowel sounds present.  EXTREMITIES: No  edema b/l.    NEUROLOGIC: nonfocal  patient is alert and awake SKIN: No obvious rash, lesion, or ulcer.   LABORATORY PANEL:  CBC Recent Labs  Lab 09/03/22 0439  WBC 9.2  HGB 15.0  HCT 46.2  PLT 279    Chemistries  Recent Labs  Lab 09/03/22 0439  NA 136  K 3.7  CL 106  CO2 23  GLUCOSE 140*  BUN 15  CREATININE 0.68  CALCIUM 8.5*   Cardiac Enzymes No results for input(s): "TROPONINI" in the last 168 hours. RADIOLOGY:  ECHOCARDIOGRAM COMPLETE  Result Date: 09/03/2022    ECHOCARDIOGRAM REPORT   Patient Name:   Shane [name] Date of Exam: 09/03/2022 Medical Rec #:  [id number]          Height:       74.0 in Accession #:    [id number]         Weight:       205.9 lb Date of Birth:  Mar 27, 1971          BSA:          2.201 m Patient Age:    51 years           BP:           124/94 mmHg Patient Gender: M                  HR:           78 bpm. Exam Location:  [facility] Procedure: 2D Echo, Color Doppler and Cardiac Doppler Indications:     I21.9 Myocardial infarction; I25.10 coronary artery disease                  Native Vessel  History:         Patient has no prior history of Echocardiogram examinations.                  Risk  Factors:Hypertension and Dyslipidemia.  Sonographer:     Humphrey Rolls Referring Phys:  [id number] DAVID W HARDING Diagnosing Phys: Julien Nordmann MD IMPRESSIONS  1. Left ventricular ejection fraction, by estimation, is 35 to 40%. The left ventricle has moderately decreased function. The left ventricle demonstrates global hypokinesis. Left ventricular diastolic parameters are consistent with Grade I diastolic dysfunction (impaired relaxation).  2. Right ventricular systolic function is mildly reduced. The right ventricular size is normal. There is normal pulmonary artery systolic pressure. The estimated right ventricular systolic pressure is 31.8 mmHg.  3. The mitral valve is normal in structure. No evidence of mitral valve regurgitation. No evidence of mitral stenosis.  4. The aortic valve is normal in structure. Aortic valve regurgitation is not visualized. Aortic valve sclerosis is  present, with no evidence of aortic valve stenosis.  5. The inferior vena cava is dilated in size with <50% respiratory variability, suggesting right atrial pressure of 15 mmHg. FINDINGS  Left Ventricle: Left ventricular ejection fraction, by estimation, is 35 to 40%. The left ventricle has moderately decreased function. The left ventricle demonstrates global hypokinesis. The left ventricular internal cavity size was normal in size. There is no left ventricular hypertrophy. Left ventricular diastolic parameters are consistent with Grade I diastolic dysfunction (impaired relaxation). Right Ventricle: The right ventricular size is normal. No increase in right ventricular wall thickness. Right ventricular systolic function is mildly reduced. There is normal pulmonary artery systolic pressure. The tricuspid regurgitant velocity is 2.05 m/s, and with an assumed right atrial pressure of 15 mmHg, the estimated right ventricular systolic pressure is 31.8 mmHg. Left Atrium: Left atrial size was normal in size. Right Atrium: Right atrial size was normal  in size. Pericardium: There is no evidence of pericardial effusion. Mitral Valve: The mitral valve is normal in structure. No evidence of mitral valve regurgitation. No evidence of mitral valve stenosis. Tricuspid Valve: The tricuspid valve is normal in structure. Tricuspid valve regurgitation is mild . No evidence of tricuspid stenosis. Aortic Valve: The aortic valve is normal in structure. Aortic valve regurgitation is not visualized. Aortic valve sclerosis is present, with no evidence of aortic valve stenosis. Aortic valve mean gradient measures 3.0 mmHg. Aortic valve peak gradient measures 4.3 mmHg. Aortic valve area, by VTI measures 2.30 cm. Pulmonic Valve: The pulmonic valve was normal in structure. Pulmonic valve regurgitation is not visualized. No evidence of pulmonic stenosis. Aorta: The aortic root is normal in size and structure. Venous: The inferior vena cava is dilated in size with less than 50% respiratory variability, suggesting right atrial pressure of 15 mmHg. IAS/Shunts: No atrial level shunt detected by color flow Doppler.  LEFT VENTRICLE PLAX 2D LVIDd:         4.50 cm   Diastology LVIDs:         3.70 cm   LV e' medial:    5.00 cm/s LV PW:         1.20 cm   LV E/e' medial:  10.0 LV IVS:        1.00 cm   LV e' lateral:   5.55 cm/s LVOT diam:     2.20 cm   LV E/e' lateral: 9.0 LV SV:         48 LV SV Index:   22 LVOT Area:     3.80 cm  RIGHT VENTRICLE RV Basal diam:  3.00 cm RV Mid diam:    3.20 cm RV S prime:     6.42 cm/s LEFT ATRIUM             Index        RIGHT ATRIUM           Index LA diam:        3.90 cm 1.77 cm/m   RA Area:     12.30 cm LA Vol (A2C):   26.4 ml 12.00 ml/m  RA Volume:   30.20 ml  13.72 ml/m LA Vol (A4C):   30.7 ml 13.95 ml/m LA Biplane Vol: 30.2 ml 13.72 ml/m  AORTIC VALVE                    PULMONIC VALVE AV Area (Vmax):    2.54 cm     PV Vmax:  0.76 m/s AV Area (Vmean):   2.43 cm     PV Vmean:      54.100 cm/s AV Area (VTI):     2.30 cm     PV VTI:         0.157 m AV Vmax:           104.00 cm/s  PV Peak grad:  2.3 mmHg AV Vmean:          79.700 cm/s  PV Mean grad:  1.0 mmHg AV VTI:            0.210 m AV Peak Grad:      4.3 mmHg AV Mean Grad:      3.0 mmHg LVOT Vmax:         69.50 cm/s LVOT Vmean:        50.900 cm/s LVOT VTI:          0.127 m LVOT/AV VTI ratio: 0.60  AORTA Ao Root diam: 3.60 cm MITRAL VALVE               TRICUSPID VALVE MV Area (PHT): 4.44 cm    TR Peak grad:   16.8 mmHg MV Decel Time: 171 msec    TR Vmax:        205.00 cm/s MV E velocity: 49.80 cm/s MV A velocity: 72.80 cm/s  SHUNTS MV E/A ratio:  0.68        Systemic VTI:  0.13 m                            Systemic Diam: 2.20 cm Julien Nordmann MD Electronically signed by Julien Nordmann MD Signature Date/Time: 09/03/2022/12:40:15 PM    Final    CARDIAC CATHETERIZATION  Addendum Date: 09/03/2022   .  Prox RCA to Mid RCA lesion is 100% stenosed. .  Balloon angioplasty was performed using a BALLN TREK RX 3.0X20. .  Aspiration thrombectomy performed using Penumbra catheter .  A drug-eluting stent was successfully placed using a STENT ONYX FRONTIER 3.5X38.  Postdilated to 3.6 mm .  A 2nd drug-eluting stent was successfully placed overlapping the previous stent proximally to cover the entire lesion, using a STENT ONYX FRONTIER 3.5X30.  Postdilated to 3.65 mm .  Post intervention, there is a 0% residual stenosis for both stents. Marland Kitchen  ------------------------- .  Mid RCA to Dist RCA lesion is 95% stenosed with 95% stenosed side branch in RPAV. .  Balloon angioplasty was performed using a BALLN TREK RX 3.0X20 -with minimal benefit. .  Aspiration thrombectomy performed using Penumbra catheter from the distal RCA although into the PL branch restoring TIMI-3 flow. .  Post intervention, there is a 0% residual stenosis. Post intervention, the side branch was reduced to 0% residual stenosis. .  1st RPL lesion is 100% stenosed.  RPDA lesion is 100% stenosed -slow flow to the apex because of distal embolization. Marland Kitchen   ------------------------- .  Normal Left Coronary System .  There is mild to moderate left ventricular systolic dysfunction.  The left ventricular ejection fraction is 45-50% by visual estimate -> basal to mid inferior wall severe hypo to akinesis .  LV end diastolic pressure is moderately elevated.-24 mmHg .  There is no aortic valve stenosis. POST-CATH DIAGNOSES Acute Inferolateral STEMI-suspect possible RV involvement with mildly reduced EF and basal to mid inferior akinesis to severe hypokinesis.  EF estimated roughly 45% CULPRIT Lesion is 100% near still RCA heavily thrombotic occlusion with  extensive thrombus throughout the entire RCA requiring Penumbra thrombectomy to both the proximal and distal portion of the vessel finally restoring TIMI-3 flow to all but the very apical portions of the RPL and PDA. => The proximal to mid will disease vessel had received treated with 2 overlapping Onyx Frontier DES Stents (3.5 mm 38 mm and 3.5 mm x 30 mm with roughly 2 to 3 mm overlap. Stents were post-dilated to 3.65 mm) => TIMI 0 flow reduced to TIMI-3 flow with 0% residual stenosis Angiographically normal Left Coronary System with a non--dominant Cx and a Large Ramus Intermedius that is the same size as it is a wraparound LAD. Temporary hypotension not unexpected with RV infarct, responded well to 2 500 mL boluses and short run of Levophed that was reduced and weaned off prior to leaving the Cath Lab.  Blood pressures were in the 110/80 range. RECOMMENDATIONS Admit to ICU-we will continue Aggrastat for 12 hours post PCI Check 2D echo in the morning-(already ordered) Cycle troponins with troponin level in the morning. We will start low-dose metoprolol 12.5 mg twice daily-titrate if possible. High-dose statin-check lipid panel in the morning. Smoke cessation counseling-he says that he will quit smoking. Bryan Lemma, MD  Result Date: 09/03/2022 .  Prox RCA to Mid RCA lesion is 100% stenosed. .  Balloon angioplasty was  performed using a BALLN TREK RX 3.0X20. .  Aspiration thrombectomy performed using Penumbra catheter .  A drug-eluting stent was successfully placed using a STENT ONYX FRONTIER 3.5X38.  Postdilated to 3.6 mm .  A 2nd drug-eluting stent was successfully placed overlapping the previous stent proximally to cover the entire lesion, using a STENT ONYX FRONTIER 3.5X30.  Postdilated to 3.65 mm .  Post intervention, there is a 0% residual stenosis for both stents. Marland Kitchen  ------------------------- .  Mid RCA to Dist RCA lesion is 95% stenosed with 95% stenosed side branch in RPAV. .  Balloon angioplasty was performed using a BALLN TREK RX 3.0X20 -with minimal benefit. .  Aspiration thrombectomy performed using Penumbra catheter from the distal RCA although into the PL branch restoring TIMI-3 flow. .  Post intervention, there is a 0% residual stenosis. Post intervention, the side branch was reduced to 0% residual stenosis. .  1st RPL lesion is 100% stenosed.  RPDA lesion is 100% stenosed -slow flow to the apex because of distal embolization. Marland Kitchen  ------------------------- .  Normal Left Coronary System .  There is mild to moderate left ventricular systolic dysfunction.  The left ventricular ejection fraction is 45-50% by visual estimate -> basal to mid inferior wall severe hypo to akinesis .  LV end diastolic pressure is moderately elevated.-24 mmHg .  There is no aortic valve stenosis. POST-CATH DIAGNOSES Acute Inferolateral STEMI-suspect possible RV involvement with mildly reduced EF and basal to mid inferior akinesis to severe hypokinesis.  EF estimated roughly 45% CULPRIT Lesion is 100% near still RCA heavily thrombotic occlusion with extensive thrombus throughout the entire RCA requiring Penumbra thrombectomy to both the proximal and distal portion of the vessel finally restoring TIMI-3 flow to all but the very apical portions of the RPL and PDA. => The proximal to mid will disease vessel had received treated with 2  overlapping Onyx Frontier DES Stents (3.5 mm 38 mm and 3.5 mm x 30 mm with roughly 2 to 3 mm overlap. Stents were post-dilated to 3.65 mm) => TIMI 0 flow reduced to TIMI-3 flow with 0% residual stenosis Angiographically normal Left Coronary System with a non--dominant  Cx and a Large Ramus Intermedius that is the same size as it is a wraparound LAD. Temporary hypotension not unexpected with RV infarct, responded well to 2 500 mL boluses and short run of Levophed that was reduced and weaned off prior to leaving the Cath Lab.  Blood pressures were in the 110/80 range. RECOMMENDATIONS Admit to ICU-we will continue Aggrastat for 12 hours post PCI Check 2D echo in the morning-(already ordered) Cycle troponins with troponin level in the morning. We will start low-dose metoprolol 12.5 mg twice daily-titrate if possible. High-dose statin-check lipid panel in the morning. Smoke cessation counseling-he says that he will quit smoking. Bryan Lemmaavid Harding, MD  DG Chest Portable 1 View  Result Date: 09/02/2022 CLINICAL DATA:  Chest pain. STEMI earlier today. Status post cardiac catheterization. EXAM: PORTABLE CHEST 1 VIEW COMPARISON:  Chest x-ray dated Mar 01, 2021. FINDINGS: The heart size and mediastinal contours are within normal limits. Both lungs are clear. The visualized skeletal structures are unremarkable. IMPRESSION: No active disease. Electronically Signed   By: Obie DredgeWilliam T Derry M.D.   On: 09/02/2022 22:05    Assessment and Plan  Shane [name] is a 51 y.o. male with medical history significant of ADHD, HTN, ADD, mild asthma, and depression but not currently on any medication who is being admitted to the hospitalist service s/p cardiac cath for acute inferior STEMI.  Patient developed persistent chest pain after helping a friend move and presented by EMS. Code STEMI was called after arrival in the ED with initial troponin of 214 and EKG showing inferior ST changes and frequent PVCs.. he had emergent cardiac cath     Acute ST elevation myocardial infarction (STEMI) of inferolateral wall (HCC) --Continue orders as placed by cardiologist, Dr. Herbie BaltimoreHarding: Aspirin 81 and Brilinta, Lipitor 80 mg, hydralazine and labetalol as needed BP and metoprolol 12.5 twice daily. --  Patient will also be on Aggrastat for 12 hours --Nitroglycerin sublingual as needed chest pain with morphine for breakthrough --Cardiology consult with Dr Kirke CorinArida  PVC's/NSVT --pt was on IV amiodarone gtt--now d/ced   HTN (hypertension) BP is controlled Patient has been started on metoprolol for STEMI   Depression Continue sertraline with mirtazapine at bedtime   Mild persistent asthma without complication Not exacerbated.  Albuterol as needed   ADD (attention deficit disorder) Holding Adderall for now  Transfer out of ICU.Marland Kitchen.ok with Dr Kirke CorinArida   Procedures:Cardiac Cath Family communication :none Consults :[facility] CODE STATUS: FULL DVT Prophylaxis :heparin Level of care: Telemetry Cardiac Status is: Inpatient Remains inpatient appropriate because: STEMI  D/c1-2 days  TOTAL TIME TAKING CARE OF THIS PATIENT: 35 minutes.  >50% time spent on counselling and coordination of care  Note: This dictation was prepared with Dragon dictation along with smaller phrase technology. Any transcriptional errors that result from this process are unintentional.  Enedina FinnerSona Clemence Stillings M.D    [facility]   CC: Primary care physician; Allegra GranaArnett, Margaret G, FNP

## 2022-09-03 NOTE — Progress Notes (Signed)
*  PRELIMINARY RESULTS* Echocardiogram 2D Echocardiogram has been performed.  Shane [name] 09/03/2022, 10:02 AM

## 2022-09-03 NOTE — Plan of Care (Signed)
  Problem: Nutrition: Goal: Adequate nutrition will be maintained Outcome: Progressing   

## 2022-09-03 NOTE — Telephone Encounter (Signed)
Pharmacy Patient Advocate Encounter  Insurance verification completed.    The patient is insured through [employer]   The patient is currently admitted and ran test claims for the following: BRILINTA 90 MG TAB.  Copays and coinsurance results were relayed to Inpatient clinical team.  Haze Rushing, CPhT Pharmacy Patient Advocate Specialist Direct Number: [phone] Fax: [phone]

## 2022-09-03 NOTE — TOC Initial Note (Signed)
Transition of Care [facility]) - Initial/Assessment Note    Patient Details  Name: Shane [name] MRN: [id number] Date of Birth: [birth year]  [facility] of [facility]) CM/SW Contact:    Allayne Butcher, RN Phone Number: 09/03/2022, 10:54 AM  Clinical Narrative:                  Transition of Care [facility]) Screening Note   Patient Details  Name: Shane [name] Date of Birth: [birth year]   Transition of Care [facility]) CM/SW Contact:    Allayne Butcher, RN Phone Number: 09/03/2022, 10:54 AM    Transition of Care Department [facility]) has reviewed patient and no TOC needs have been identified at this time. We will continue to monitor patient advancement through interdisciplinary progression rounds. If new patient transition needs arise, please place a TOC consult.          Patient Goals and CMS Choice        Expected Discharge Plan and Services                                                Prior Living Arrangements/Services                       Activities of Daily Living Home Assistive Devices/Equipment: None ADL Screening (condition at time of admission) Patient's cognitive ability adequate to safely complete daily activities?: Yes Is the patient deaf or have difficulty hearing?: No Does the patient have difficulty seeing, even when wearing glasses/contacts?: No Does the patient have difficulty concentrating, remembering, or making decisions?: No Patient able to express need for assistance with ADLs?: Yes Does the patient have difficulty dressing or bathing?: No Independently performs ADLs?: Yes (appropriate for developmental age) Does the patient have difficulty walking or climbing stairs?: No Weakness of Legs: None Weakness of Arms/Hands: None  Permission Sought/Granted                  Emotional Assessment              Admission diagnosis:  Acute ST elevation myocardial infarction (STEMI) of inferolateral wall (HCC) [I21.19] STEMI  (ST elevation myocardial infarction) (HCC) [I21.3] Patient Active Problem List   Diagnosis Date Noted   Acute ST elevation myocardial infarction (STEMI) of inferolateral wall (HCC) 09/02/2022   Eosinophilia 03/03/2022   Elevated hemoglobin (HCC) 01/01/2022   Rash 04/15/2020   Open wound of ear 03/05/2020   Chest pain 03/02/2019   Snores 05/08/2018   Right elbow pain 05/08/2018   HTN (hypertension) 12/05/2017   Eczema 12/05/2017   Wellness examination 12/05/2017   Tobacco abuse 12/05/2017   Acne 04/22/2017   Recurrent major depressive disorder, in full remission (HCC) 01/14/2017   Depression 07/09/2016   Mild persistent asthma without complication 10/21/2015   H/O ETOH abuse 12/18/2013   ADD (attention deficit disorder) 04/07/2013   Hyperlipidemia 12/17/2012   PCP:  Allegra Grana, FNP Pharmacy:   [facility] - [facility] [address] [place] [address] Phone: [phone] Fax: [phone]  [facility] [place], [address] - [place], [place] - [address] [place] [address] Phone: [phone] Fax: [phone]  [facility] - [facility] [address] [place] [address] Phone: [phone] Fax: [phone]     Social Determinants of Health (SDOH)  Interventions    Readmission Risk Interventions     No data to display

## 2022-09-04 DIAGNOSIS — I2119 ST elevation (STEMI) myocardial infarction involving other coronary artery of inferior wall: Secondary | ICD-10-CM | POA: Diagnosis not present

## 2022-09-04 DIAGNOSIS — I5042 Chronic combined systolic (congestive) and diastolic (congestive) heart failure: Secondary | ICD-10-CM

## 2022-09-04 DIAGNOSIS — E7800 Pure hypercholesterolemia, unspecified: Secondary | ICD-10-CM

## 2022-09-04 DIAGNOSIS — I5041 Acute combined systolic (congestive) and diastolic (congestive) heart failure: Secondary | ICD-10-CM

## 2022-09-04 DIAGNOSIS — Z720 Tobacco use: Secondary | ICD-10-CM

## 2022-09-04 LAB — LIPID PANEL
Cholesterol: 176 mg/dL (ref 0–200)
HDL: 37 mg/dL — ABNORMAL LOW (ref >40)
LDL Cholesterol: 113 mg/dL — ABNORMAL HIGH (ref 0–99)
Total CHOL/HDL Ratio: 4.8 RATIO
Triglycerides: 132 mg/dL (ref <150)
VLDL: 26 mg/dL (ref 0–40)

## 2022-09-04 LAB — LIPOPROTEIN A (LPA): Lipoprotein (a): 9.5 nmol/L (ref <75.0)

## 2022-09-04 MED ORDER — METOPROLOL SUCCINATE ER 50 MG PO TB24
50.0000 mg | ORAL_TABLET | Freq: Every day | ORAL | Status: DC
  Administered 2022-09-05: 50 mg via ORAL
  Filled 2022-09-04: qty 1

## 2022-09-04 MED ORDER — DAPAGLIFLOZIN PROPANEDIOL 10 MG PO TABS
10.0000 mg | ORAL_TABLET | Freq: Every day | ORAL | Status: DC
  Administered 2022-09-04 – 2022-09-05 (×2): 10 mg via ORAL
  Filled 2022-09-04 (×2): qty 1

## 2022-09-04 MED ORDER — CHLORHEXIDINE GLUCONATE CLOTH 2 % EX PADS
6.0000 | MEDICATED_PAD | Freq: Every day | CUTANEOUS | Status: DC
  Administered 2022-09-04 – 2022-09-05 (×2): 6 via TOPICAL

## 2022-09-04 NOTE — Progress Notes (Signed)
Rounding Note    Patient Name: Shane [name] Date of Encounter: 09/04/2022  [facility] Cardiologist: Bryan Lemma, MD   Subjective   Feeling well.  He did have some chest pain yesterday that resolved with Tylenol.  He has not yet ambulated.  Inpatient Medications    Scheduled Meds:  amphetamine-dextroamphetamine  20 mg Oral BID   aspirin  81 mg Oral Daily   atorvastatin  80 mg Oral Daily   Chlorhexidine Gluconate Cloth  6 each Topical Daily   heparin  5,000 Units Subcutaneous Q8H   metoprolol tartrate  25 mg Oral BID   mirtazapine  15 mg Oral QHS   sertraline  100 mg Oral Daily   sodium chloride flush  3 mL Intravenous Q12H   ticagrelor  90 mg Oral BID   Continuous Infusions:  sodium chloride     PRN Meds: sodium chloride, acetaminophen **OR** acetaminophen, morphine injection, ondansetron **OR** ondansetron (ZOFRAN) IV, sodium chloride flush   Vital Signs    Vitals:   09/04/22 0500 09/04/22 0600 09/04/22 0700 09/04/22 0951  BP: 99/60 100/78  114/84  Pulse: 74 90 79 85  Resp: 14 15 16    Temp:      TempSrc:      SpO2: 95% 99% 93%   Weight:        Intake/Output Summary (Last 24 hours) at 09/04/2022 1037 Last data filed at 09/04/2022 1018 Gross per 24 hour  Intake 1250 ml  Output 3475 ml  Net -2225 ml      09/02/2022   10:00 PM 07/05/2022    3:43 PM 04/02/2022    2:59 PM  Last 3 Weights  Weight (lbs) 205 lb 14.6 oz 208 lb 6.4 oz 209 lb  Weight (kg) 93.4 kg 94.53 kg 94.802 kg      Telemetry    Sinus rhythm.  PVCs.- Personally Reviewed  ECG    09/04/2022: Sinus rhythm.  Rate 80 bpm.  Evolving inferior infarct. - Personally Reviewed  Physical Exam   VS:  BP 114/85   Pulse 90   Temp 98.6 F (37 C) (Oral)   Resp 20   Wt 93.4 kg   SpO2 94%   BMI 26.44 kg/m  , BMI Body mass index is 26.44 kg/m. GENERAL:  Well appearing HEENT: Pupils equal round and reactive, fundi not visualized, oral mucosa unremarkable NECK:  No jugular  venous distention, waveform within normal limits, carotid upstroke brisk and symmetric, no bruits, no thyromegaly LUNGS:  Clear to auscultation bilaterally HEART:  RRR.  PMI not displaced or sustained,S1 and S2 within normal limits, no S3, no S4, no clicks, no rubs, no murmurs ABD:  Flat, positive bowel sounds normal in frequency in pitch, no bruits, no rebound, no guarding, no midline pulsatile mass, no hepatomegaly, no splenomegaly EXT:  2 plus pulses throughout, no edema, no cyanosis no clubbing SKIN:  No rashes no nodules NEURO:  Cranial nerves II through XII grossly intact, motor grossly intact throughout PSYCH:  Cognitively intact, oriented to person place and time   Labs    High Sensitivity Troponin:   Recent Labs  Lab 09/02/22 1936 09/02/22 2156  TROPONINIHS 214* >24,000*     Chemistry Recent Labs  Lab 09/02/22 1936 09/02/22 2156 09/03/22 0439  NA 137  --  136  K 4.1  --  3.7  CL 104  --  106  CO2 24  --  23  GLUCOSE 148*  --  140*  BUN 14  --  15  CREATININE 0.92 0.79 0.68  CALCIUM 9.1  --  8.5*  GFRNONAA >60 >60 >60  ANIONGAP 9  --  7    Lipids No results for input(s): "CHOL", "TRIG", "HDL", "LABVLDL", "LDLCALC", "CHOLHDL" in the last 168 hours.  Hematology Recent Labs  Lab 09/02/22 1936 09/02/22 2156 09/03/22 0439  WBC 12.5* 9.8 9.2  RBC 5.10 4.75 4.66  HGB 16.4 15.7 15.0  HCT 49.4 45.5 46.2  MCV 96.9 95.8 99.1  MCH 32.2 33.1 32.2  MCHC 33.2 34.5 32.5  RDW 12.8 12.8 13.2  PLT 290 262 279   Thyroid No results for input(s): "TSH", "FREET4" in the last 168 hours.  BNPNo results for input(s): "BNP", "PROBNP" in the last 168 hours.  DDimer No results for input(s): "DDIMER" in the last 168 hours.   Radiology    ECHOCARDIOGRAM COMPLETE  Result Date: 09/03/2022    ECHOCARDIOGRAM REPORT   Patient Name:   Shane [name] Date of Exam: 09/03/2022 Medical Rec #:  [id number]          Height:       74.0 in Accession #:    [id number]         Weight:        205.9 lb Date of Birth:  [birth year]          BSA:          2.201 m Patient Age:    51 years           BP:           124/94 mmHg Patient Gender: M                  HR:           78 bpm. Exam Location:  [facility] Procedure: 2D Echo, Color Doppler and Cardiac Doppler Indications:     I21.9 Myocardial infarction; I25.10 coronary artery disease                  Native Vessel  History:         Patient has no prior history of Echocardiogram examinations.                  Risk Factors:Hypertension and Dyslipidemia.  Sonographer:     Humphrey Rolls Referring Phys:  [id number] DAVID W HARDING Diagnosing Phys: Julien Nordmann MD IMPRESSIONS  1. Left ventricular ejection fraction, by estimation, is 35 to 40%. The left ventricle has moderately decreased function. The left ventricle demonstrates global hypokinesis. Left ventricular diastolic parameters are consistent with Grade I diastolic dysfunction (impaired relaxation).  2. Right ventricular systolic function is mildly reduced. The right ventricular size is normal. There is normal pulmonary artery systolic pressure. The estimated right ventricular systolic pressure is 31.8 mmHg.  3. The mitral valve is normal in structure. No evidence of mitral valve regurgitation. No evidence of mitral stenosis.  4. The aortic valve is normal in structure. Aortic valve regurgitation is not visualized. Aortic valve sclerosis is present, with no evidence of aortic valve stenosis.  5. The inferior vena cava is dilated in size with <50% respiratory variability, suggesting right atrial pressure of 15 mmHg. FINDINGS  Left Ventricle: Left ventricular ejection fraction, by estimation, is 35 to 40%. The left ventricle has moderately decreased function. The left ventricle demonstrates global hypokinesis. The left ventricular internal cavity size was normal in size. There is no left ventricular hypertrophy. Left ventricular diastolic parameters are consistent with Grade I diastolic dysfunction (impaired  relaxation).  Right Ventricle: The right ventricular size is normal. No increase in right ventricular wall thickness. Right ventricular systolic function is mildly reduced. There is normal pulmonary artery systolic pressure. The tricuspid regurgitant velocity is 2.05 m/s, and with an assumed right atrial pressure of 15 mmHg, the estimated right ventricular systolic pressure is 31.8 mmHg. Left Atrium: Left atrial size was normal in size. Right Atrium: Right atrial size was normal in size. Pericardium: There is no evidence of pericardial effusion. Mitral Valve: The mitral valve is normal in structure. No evidence of mitral valve regurgitation. No evidence of mitral valve stenosis. Tricuspid Valve: The tricuspid valve is normal in structure. Tricuspid valve regurgitation is mild . No evidence of tricuspid stenosis. Aortic Valve: The aortic valve is normal in structure. Aortic valve regurgitation is not visualized. Aortic valve sclerosis is present, with no evidence of aortic valve stenosis. Aortic valve mean gradient measures 3.0 mmHg. Aortic valve peak gradient measures 4.3 mmHg. Aortic valve area, by VTI measures 2.30 cm. Pulmonic Valve: The pulmonic valve was normal in structure. Pulmonic valve regurgitation is not visualized. No evidence of pulmonic stenosis. Aorta: The aortic root is normal in size and structure. Venous: The inferior vena cava is dilated in size with less than 50% respiratory variability, suggesting right atrial pressure of 15 mmHg. IAS/Shunts: No atrial level shunt detected by color flow Doppler.  LEFT VENTRICLE PLAX 2D LVIDd:         4.50 cm   Diastology LVIDs:         3.70 cm   LV e' medial:    5.00 cm/s LV PW:         1.20 cm   LV E/e' medial:  10.0 LV IVS:        1.00 cm   LV e' lateral:   5.55 cm/s LVOT diam:     2.20 cm   LV E/e' lateral: 9.0 LV SV:         48 LV SV Index:   22 LVOT Area:     3.80 cm  RIGHT VENTRICLE RV Basal diam:  3.00 cm RV Mid diam:    3.20 cm RV S prime:     6.42 cm/s LEFT ATRIUM              Index        RIGHT ATRIUM           Index LA diam:        3.90 cm 1.77 cm/m   RA Area:     12.30 cm LA Vol (A2C):   26.4 ml 12.00 ml/m  RA Volume:   30.20 ml  13.72 ml/m LA Vol (A4C):   30.7 ml 13.95 ml/m LA Biplane Vol: 30.2 ml 13.72 ml/m  AORTIC VALVE                    PULMONIC VALVE AV Area (Vmax):    2.54 cm     PV Vmax:       0.76 m/s AV Area (Vmean):   2.43 cm     PV Vmean:      54.100 cm/s AV Area (VTI):     2.30 cm     PV VTI:        0.157 m AV Vmax:           104.00 cm/s  PV Peak grad:  2.3 mmHg AV Vmean:          79.700 cm/s  PV Mean  grad:  1.0 mmHg AV VTI:            0.210 m AV Peak Grad:      4.3 mmHg AV Mean Grad:      3.0 mmHg LVOT Vmax:         69.50 cm/s LVOT Vmean:        50.900 cm/s LVOT VTI:          0.127 m LVOT/AV VTI ratio: 0.60  AORTA Ao Root diam: 3.60 cm MITRAL VALVE               TRICUSPID VALVE MV Area (PHT): 4.44 cm    TR Peak grad:   16.8 mmHg MV Decel Time: 171 msec    TR Vmax:        205.00 cm/s MV E velocity: 49.80 cm/s MV A velocity: 72.80 cm/s  SHUNTS MV E/A ratio:  0.68        Systemic VTI:  0.13 m                            Systemic Diam: 2.20 cm Julien Nordmann MD Electronically signed by Julien Nordmann MD Signature Date/Time: 09/03/2022/12:40:15 PM    Final    CARDIAC CATHETERIZATION  Addendum Date: 09/03/2022     Prox RCA to Mid RCA lesion is 100% stenosed.   Balloon angioplasty was performed using a BALLN TREK RX 3.0X20.   Aspiration thrombectomy performed using Penumbra catheter   A drug-eluting stent was successfully placed using a STENT ONYX FRONTIER 3.5X38.  Postdilated to 3.6 mm   A 2nd drug-eluting stent was successfully placed overlapping the previous stent proximally to cover the entire lesion, using a STENT ONYX FRONTIER 3.5X30.  Postdilated to 3.65 mm   Post intervention, there is a 0% residual stenosis for both stents.   -------------------------   Mid RCA to Dist RCA lesion is 95% stenosed with 95% stenosed side branch in RPAV.   Balloon  angioplasty was performed using a BALLN TREK RX 3.0X20 -with minimal benefit.   Aspiration thrombectomy performed using Penumbra catheter from the distal RCA although into the PL branch restoring TIMI-3 flow.   Post intervention, there is a 0% residual stenosis. Post intervention, the side branch was reduced to 0% residual stenosis.   1st RPL lesion is 100% stenosed.  RPDA lesion is 100% stenosed -slow flow to the apex because of distal embolization.   -------------------------   Normal Left Coronary System   There is mild to moderate left ventricular systolic dysfunction.  The left ventricular ejection fraction is 45-50% by visual estimate -> basal to mid inferior wall severe hypo to akinesis   LV end diastolic pressure is moderately elevated.-24 mmHg   There is no aortic valve stenosis. POST-CATH DIAGNOSES Acute Inferolateral STEMI-suspect possible RV involvement with mildly reduced EF and basal to mid inferior akinesis to severe hypokinesis.  EF estimated roughly 45% CULPRIT Lesion is 100% near still RCA heavily thrombotic occlusion with extensive thrombus throughout the entire RCA requiring Penumbra thrombectomy to both the proximal and distal portion of the vessel finally restoring TIMI-3 flow to all but the very apical portions of the RPL and PDA. => The proximal to mid will disease vessel had received treated with 2 overlapping Onyx Frontier DES Stents (3.5 mm 38 mm and 3.5 mm x 30 mm with roughly 2 to 3 mm overlap. Stents were post-dilated to 3.65 mm) => TIMI 0 flow reduced to TIMI-3 flow  with 0% residual stenosis Angiographically normal Left Coronary System with a non--dominant Cx and a Large Ramus Intermedius that is the same size as it is a wraparound LAD. Temporary hypotension not unexpected with RV infarct, responded well to 2 500 mL boluses and short run of Levophed that was reduced and weaned off prior to leaving the Cath Lab.  Blood pressures were in the 110/80 range. RECOMMENDATIONS Admit to ICU-we  will continue Aggrastat for 12 hours post PCI Check 2D echo in the morning-(already ordered) Cycle troponins with troponin level in the morning. We will start low-dose metoprolol 12.5 mg twice daily-titrate if possible. High-dose statin-check lipid panel in the morning. Smoke cessation counseling-he says that he will quit smoking. Bryan Lemma, MD  Result Date: 09/03/2022   Prox RCA to Mid RCA lesion is 100% stenosed.   Balloon angioplasty was performed using a BALLN TREK RX 3.0X20.   Aspiration thrombectomy performed using Penumbra catheter   A drug-eluting stent was successfully placed using a STENT ONYX FRONTIER 3.5X38.  Postdilated to 3.6 mm   A 2nd drug-eluting stent was successfully placed overlapping the previous stent proximally to cover the entire lesion, using a STENT ONYX FRONTIER 3.5X30.  Postdilated to 3.65 mm   Post intervention, there is a 0% residual stenosis for both stents.   -------------------------   Mid RCA to Dist RCA lesion is 95% stenosed with 95% stenosed side branch in RPAV.   Balloon angioplasty was performed using a BALLN TREK RX 3.0X20 -with minimal benefit.   Aspiration thrombectomy performed using Penumbra catheter from the distal RCA although into the PL branch restoring TIMI-3 flow.   Post intervention, there is a 0% residual stenosis. Post intervention, the side branch was reduced to 0% residual stenosis.   1st RPL lesion is 100% stenosed.  RPDA lesion is 100% stenosed -slow flow to the apex because of distal embolization.   -------------------------   Normal Left Coronary System   There is mild to moderate left ventricular systolic dysfunction.  The left ventricular ejection fraction is 45-50% by visual estimate -> basal to mid inferior wall severe hypo to akinesis   LV end diastolic pressure is moderately elevated.-24 mmHg   There is no aortic valve stenosis. POST-CATH DIAGNOSES Acute Inferolateral STEMI-suspect possible RV involvement with mildly reduced EF and basal to mid  inferior akinesis to severe hypokinesis.  EF estimated roughly 45% CULPRIT Lesion is 100% near still RCA heavily thrombotic occlusion with extensive thrombus throughout the entire RCA requiring Penumbra thrombectomy to both the proximal and distal portion of the vessel finally restoring TIMI-3 flow to all but the very apical portions of the RPL and PDA. => The proximal to mid will disease vessel had received treated with 2 overlapping Onyx Frontier DES Stents (3.5 mm 38 mm and 3.5 mm x 30 mm with roughly 2 to 3 mm overlap. Stents were post-dilated to 3.65 mm) => TIMI 0 flow reduced to TIMI-3 flow with 0% residual stenosis Angiographically normal Left Coronary System with a non--dominant Cx and a Large Ramus Intermedius that is the same size as it is a wraparound LAD. Temporary hypotension not unexpected with RV infarct, responded well to 2 500 mL boluses and short run of Levophed that was reduced and weaned off prior to leaving the Cath Lab.  Blood pressures were in the 110/80 range. RECOMMENDATIONS Admit to ICU-we will continue Aggrastat for 12 hours post PCI Check 2D echo in the morning-(already ordered) Cycle troponins with troponin level in the morning. We  will start low-dose metoprolol 12.5 mg twice daily-titrate if possible. High-dose statin-check lipid panel in the morning. Smoke cessation counseling-he says that he will quit smoking. Bryan Lemmaavid Harding, MD  DG Chest Portable 1 View  Result Date: 09/02/2022 CLINICAL DATA:  Chest pain. STEMI earlier today. Status post cardiac catheterization. EXAM: PORTABLE CHEST 1 VIEW COMPARISON:  Chest x-ray dated Mar 01, 2021. FINDINGS: The heart size and mediastinal contours are within normal limits. Both lungs are clear. The visualized skeletal structures are unremarkable. IMPRESSION: No active disease. Electronically Signed   By: Obie DredgeWilliam T Derry M.D.   On: 09/02/2022 22:05    Cardiac Studies   Echo 09/03/22:   1. Left ventricular ejection fraction, by estimation,  is 35 to 40%. The  left ventricle has moderately decreased function. The left ventricle  demonstrates global hypokinesis. Left ventricular diastolic parameters are  consistent with Grade I diastolic  dysfunction (impaired relaxation).   2. Right ventricular systolic function is mildly reduced. The right  ventricular size is normal. There is normal pulmonary artery systolic  pressure. The estimated right ventricular systolic pressure is 31.8 mmHg.   3. The mitral valve is normal in structure. No evidence of mitral valve  regurgitation. No evidence of mitral stenosis.   4. The aortic valve is normal in structure. Aortic valve regurgitation is  not visualized. Aortic valve sclerosis is present, with no evidence of  aortic valve stenosis.   5. The inferior vena cava is dilated in size with <50% respiratory  variability, suggesting right atrial pressure of 15 mmHg.    Patient Profile     51 y.o. male with tobacco abuse and ADHD admitted with inferior STEMI.  Assessment & Plan    # Inferior STEMI: # Hyperlipidemia: Patient was admitted with an inferior STEMI.  He had an occluded RCA with a massive amount of thrombus.  This was successfully treated with aspiration thrombectomy and 2 overlapping drug-eluting stents.  Plan to continue aspirin anticoagulant for at least 12 months.  Atorvastatin started this admission.  Will need lipids and a CMP checked in 2 to 3 months.  LDL goal is less than 70.  We will get a baseline lipid panel.  LP(a) was within normal limits.  He will need outpatient cardiac rehab.  # Acute systolic and diastolic HF:  LVEF 35 to 40% this admission.  He was started on metoprolol this admission.  We will Plan to consolidate to succinate tomorrow.  Unfortunately, blood pressure is too low to add other GDMT.  Blood pressure is too low to start ARB or spironolactone at this time.  Will add [place]Farxiga.  Plan to repeat echo in 3 months.   # NSVT: Metoprolol was increased and  amiodarone was discontinued.    # Tobacco abuse: He smokes 1 pack of cigarettes every 2 to 3 days.  He has not had any urge to smoke since being in the hospital does not request a patch at this time.  He had already been cutting back on his smoking and is committed to quitting now.  # Dispo: Plan to discharge tomorrow if he is stable with ambulation.  Would like to add GDMT if blood pressure will allow.     For questions or updates, please contact [facility] Please consult [email or web address] for contact info under        Signed, Chilton Siiffany [place], MD  09/04/2022, 10:37 AM

## 2022-09-04 NOTE — Progress Notes (Signed)
[facility]  - [place] [facility]   PATIENT NAME: Shane [name]    MR#:  [id number]  DATE OF BIRTH:  12/12/1970  SUBJECTIVE:  family at bedside. Patient chest pain-free.  VITALS:  Blood pressure 114/85, pulse 90, temperature 98.6 F (37 C), temperature source Oral, resp. rate 20, weight 93.4 kg, SpO2 94 %.  PHYSICAL EXAMINATION:   GENERAL:  51 y.o.-year-old patient lying in the bed with no acute distress.  LUNGS: Normal breath sounds bilaterally, no wheezing CARDIOVASCULAR: S1, S2 normal. No murmurs,   ABDOMEN: Soft, nontender, nondistended. Bowel sounds present.  EXTREMITIES: No  edema b/l.    NEUROLOGIC: nonfocal  patient is alert and awake SKIN: No obvious rash, lesion, or ulcer.   LABORATORY PANEL:  CBC Recent Labs  Lab 09/03/22 0439  WBC 9.2  HGB 15.0  HCT 46.2  PLT 279     Chemistries  Recent Labs  Lab 09/03/22 0439  NA 136  K 3.7  CL 106  CO2 23  GLUCOSE 140*  BUN 15  CREATININE 0.68  CALCIUM 8.5*    Cardiac Enzymes No results for input(s): "TROPONINI" in the last 168 hours. RADIOLOGY:  ECHOCARDIOGRAM COMPLETE  Result Date: 09/03/2022    ECHOCARDIOGRAM REPORT   Patient Name:   Shane [name] Date of Exam: 09/03/2022 Medical Rec #:  [id number]          Height:       74.0 in Accession #:    [id number]         Weight:       205.9 lb Date of Birth:  October 03, 1971          BSA:          2.201 m Patient Age:    51 years           BP:           124/94 mmHg Patient Gender: M                  HR:           78 bpm. Exam Location:  [facility] Procedure: 2D Echo, Color Doppler and Cardiac Doppler Indications:     I21.9 Myocardial infarction; I25.10 coronary artery disease                  Native Vessel  History:         Patient has no prior history of Echocardiogram examinations.                  Risk Factors:Hypertension and Dyslipidemia.  Sonographer:     Humphrey Rolls Referring Phys:  [id number] DAVID W HARDING Diagnosing Phys: Julien Nordmann MD IMPRESSIONS   1. Left ventricular ejection fraction, by estimation, is 35 to 40%. The left ventricle has moderately decreased function. The left ventricle demonstrates global hypokinesis. Left ventricular diastolic parameters are consistent with Grade I diastolic dysfunction (impaired relaxation).  2. Right ventricular systolic function is mildly reduced. The right ventricular size is normal. There is normal pulmonary artery systolic pressure. The estimated right ventricular systolic pressure is 31.8 mmHg.  3. The mitral valve is normal in structure. No evidence of mitral valve regurgitation. No evidence of mitral stenosis.  4. The aortic valve is normal in structure. Aortic valve regurgitation is not visualized. Aortic valve sclerosis is present, with no evidence of aortic valve stenosis.  5. The inferior vena cava is dilated in size with <50% respiratory variability, suggesting right atrial pressure  of 15 mmHg. FINDINGS  Left Ventricle: Left ventricular ejection fraction, by estimation, is 35 to 40%. The left ventricle has moderately decreased function. The left ventricle demonstrates global hypokinesis. The left ventricular internal cavity size was normal in size. There is no left ventricular hypertrophy. Left ventricular diastolic parameters are consistent with Grade I diastolic dysfunction (impaired relaxation). Right Ventricle: The right ventricular size is normal. No increase in right ventricular wall thickness. Right ventricular systolic function is mildly reduced. There is normal pulmonary artery systolic pressure. The tricuspid regurgitant velocity is 2.05 m/s, and with an assumed right atrial pressure of 15 mmHg, the estimated right ventricular systolic pressure is [id number] mmHg. Left Atrium: Left atrial size was normal in size. Right Atrium: Right atrial size was normal in size. Pericardium: There is no evidence of pericardial effusion. Mitral Valve: The mitral valve is normal in structure. No evidence of mitral valve  regurgitation. No evidence of mitral valve stenosis. Tricuspid Valve: The tricuspid valve is normal in structure. Tricuspid valve regurgitation is mild . No evidence of tricuspid stenosis. Aortic Valve: The aortic valve is normal in structure. Aortic valve regurgitation is not visualized. Aortic valve sclerosis is present, with no evidence of aortic valve stenosis. Aortic valve mean gradient measures 3.0 mmHg. Aortic valve peak gradient measures 4.3 mmHg. Aortic valve area, by VTI measures 2.30 cm. Pulmonic Valve: The pulmonic valve was normal in structure. Pulmonic valve regurgitation is not visualized. No evidence of pulmonic stenosis. Aorta: The aortic root is normal in size and structure. Venous: The inferior vena cava is dilated in size with less than 50% respiratory variability, suggesting right atrial pressure of 15 mmHg. IAS/Shunts: No atrial level shunt detected by color flow Doppler.  LEFT VENTRICLE PLAX 2D LVIDd:         4.50 cm   Diastology LVIDs:         3.70 cm   LV e' medial:    5.00 cm/s LV PW:         1.20 cm   LV E/e' medial:  10.0 LV IVS:        1.00 cm   LV e' lateral:   5.55 cm/s LVOT diam:     2.20 cm   LV E/e' lateral: 9.0 LV SV:         48 LV SV Index:   22 LVOT Area:     3.80 cm  RIGHT VENTRICLE RV Basal diam:  3.00 cm RV Mid diam:    3.20 cm RV S prime:     6.42 cm/s LEFT ATRIUM             Index        RIGHT ATRIUM           Index LA diam:        3.90 cm 1.77 cm/m   RA Area:     12.30 cm LA Vol (A2C):   26.4 ml 12.00 ml/m  RA Volume:   30.20 ml  13.72 ml/m LA Vol (A4C):   30.7 ml 13.95 ml/m LA Biplane Vol: 30.2 ml 13.72 ml/m  AORTIC VALVE                    PULMONIC VALVE AV Area (Vmax):    2.54 cm     PV Vmax:       0.76 m/s AV Area (Vmean):   2.43 cm     PV Vmean:      54.100 cm/s AV Area (VTI):  2.30 cm     PV VTI:        0.157 m AV Vmax:           104.00 cm/s  PV Peak grad:  2.3 mmHg AV Vmean:          79.700 cm/s  PV Mean grad:  1.0 mmHg AV VTI:            0.210 m AV  Peak Grad:      4.3 mmHg AV Mean Grad:      3.0 mmHg LVOT Vmax:         69.50 cm/s LVOT Vmean:        50.900 cm/s LVOT VTI:          0.127 m LVOT/AV VTI ratio: 0.60  AORTA Ao Root diam: 3.60 cm MITRAL VALVE               TRICUSPID VALVE MV Area (PHT): 4.44 cm    TR Peak grad:   16.8 mmHg MV Decel Time: 171 msec    TR Vmax:        205.00 cm/s MV E velocity: 49.80 cm/s MV A velocity: 72.80 cm/s  SHUNTS MV E/A ratio:  0.68        Systemic VTI:  0.13 m                            Systemic Diam: 2.20 cm Ida Rogue MD Electronically signed by Ida Rogue MD Signature Date/Time: 09/03/2022/12:40:15 PM    Final    CARDIAC CATHETERIZATION  Addendum Date: 09/03/2022     Prox RCA to Mid RCA lesion is 100% stenosed.   Balloon angioplasty was performed using a BALLN TREK RX 3.0X20.   Aspiration thrombectomy performed using Penumbra catheter   A drug-eluting stent was successfully placed using a STENT ONYX FRONTIER 3.5X38.  Postdilated to 3.6 mm   A 2nd drug-eluting stent was successfully placed overlapping the previous stent proximally to cover the entire lesion, using a STENT ONYX FRONTIER 3.5X30.  Postdilated to 3.65 mm   Post intervention, there is a 0% residual stenosis for both stents.   -------------------------   Mid RCA to Dist RCA lesion is 95% stenosed with 95% stenosed side branch in RPAV.   Balloon angioplasty was performed using a BALLN TREK RX 3.0X20 -with minimal benefit.   Aspiration thrombectomy performed using Penumbra catheter from the distal RCA although into the PL branch restoring TIMI-3 flow.   Post intervention, there is a 0% residual stenosis. Post intervention, the side branch was reduced to 0% residual stenosis.   1st RPL lesion is 100% stenosed.  RPDA lesion is 100% stenosed -slow flow to the apex because of distal embolization.   -------------------------   Normal Left Coronary System   There is mild to moderate left ventricular systolic dysfunction.  The left ventricular ejection fraction  is 45-50% by visual estimate -> basal to mid inferior wall severe hypo to akinesis   LV end diastolic pressure is moderately elevated.-24 mmHg   There is no aortic valve stenosis. POST-CATH DIAGNOSES Acute Inferolateral STEMI-suspect possible RV involvement with mildly reduced EF and basal to mid inferior akinesis to severe hypokinesis.  EF estimated roughly 45% CULPRIT Lesion is 100% near still RCA heavily thrombotic occlusion with extensive thrombus throughout the entire RCA requiring Penumbra thrombectomy to both the proximal and distal portion of the vessel finally restoring TIMI-3 flow to all but the very apical  portions of the RPL and PDA. => The proximal to mid will disease vessel had received treated with 2 overlapping Onyx Frontier DES Stents (3.5 mm 38 mm and 3.5 mm x 30 mm with roughly 2 to 3 mm overlap. Stents were post-dilated to 3.65 mm) => TIMI 0 flow reduced to TIMI-3 flow with 0% residual stenosis Angiographically normal Left Coronary System with a non--dominant Cx and a Large Ramus Intermedius that is the same size as it is a wraparound LAD. Temporary hypotension not unexpected with RV infarct, responded well to 2 500 mL boluses and short run of Levophed that was reduced and weaned off prior to leaving the Cath Lab.  Blood pressures were in the 110/80 range. RECOMMENDATIONS Admit to ICU-we will continue Aggrastat for 12 hours post PCI Check 2D echo in the morning-(already ordered) Cycle troponins with troponin level in the morning. We will start low-dose metoprolol 12.5 mg twice daily-titrate if possible. High-dose statin-check lipid panel in the morning. Smoke cessation counseling-he says that he will quit smoking. Glenetta Hew, MD  Result Date: 09/03/2022   Prox RCA to Mid RCA lesion is 100% stenosed.   Balloon angioplasty was performed using a BALLN TREK RX 3.0X20.   Aspiration thrombectomy performed using Penumbra catheter   A drug-eluting stent was successfully placed using a STENT ONYX  FRONTIER 3.5X38.  Postdilated to 3.6 mm   A 2nd drug-eluting stent was successfully placed overlapping the previous stent proximally to cover the entire lesion, using a STENT ONYX FRONTIER 3.5X30.  Postdilated to 3.65 mm   Post intervention, there is a 0% residual stenosis for both stents.   -------------------------   Mid RCA to Dist RCA lesion is 95% stenosed with 95% stenosed side branch in RPAV.   Balloon angioplasty was performed using a BALLN TREK RX 3.0X20 -with minimal benefit.   Aspiration thrombectomy performed using Penumbra catheter from the distal RCA although into the PL branch restoring TIMI-3 flow.   Post intervention, there is a 0% residual stenosis. Post intervention, the side branch was reduced to 0% residual stenosis.   1st RPL lesion is 100% stenosed.  RPDA lesion is 100% stenosed -slow flow to the apex because of distal embolization.   -------------------------   Normal Left Coronary System   There is mild to moderate left ventricular systolic dysfunction.  The left ventricular ejection fraction is 45-50% by visual estimate -> basal to mid inferior wall severe hypo to akinesis   LV end diastolic pressure is moderately elevated.-24 mmHg   There is no aortic valve stenosis. POST-CATH DIAGNOSES Acute Inferolateral STEMI-suspect possible RV involvement with mildly reduced EF and basal to mid inferior akinesis to severe hypokinesis.  EF estimated roughly 45% CULPRIT Lesion is 100% near still RCA heavily thrombotic occlusion with extensive thrombus throughout the entire RCA requiring Penumbra thrombectomy to both the proximal and distal portion of the vessel finally restoring TIMI-3 flow to all but the very apical portions of the RPL and PDA. => The proximal to mid will disease vessel had received treated with 2 overlapping Onyx Frontier DES Stents (3.5 mm 38 mm and 3.5 mm x 30 mm with roughly 2 to 3 mm overlap. Stents were post-dilated to 3.65 mm) => TIMI 0 flow reduced to TIMI-3 flow with 0%  residual stenosis Angiographically normal Left Coronary System with a non--dominant Cx and a Large Ramus Intermedius that is the same size as it is a wraparound LAD. Temporary hypotension not unexpected with RV infarct, responded well to 2 500  mL boluses and short run of Levophed that was reduced and weaned off prior to leaving the Cath Lab.  Blood pressures were in the 110/80 range. RECOMMENDATIONS Admit to ICU-we will continue Aggrastat for 12 hours post PCI Check 2D echo in the morning-(already ordered) Cycle troponins with troponin level in the morning. We will start low-dose metoprolol 12.5 mg twice daily-titrate if possible. High-dose statin-check lipid panel in the morning. Smoke cessation counseling-he says that he will quit smoking. Glenetta Hew, MD  DG Chest Portable 1 View  Result Date: 09/02/2022 CLINICAL DATA:  Chest pain. STEMI earlier today. Status post cardiac catheterization. EXAM: PORTABLE CHEST 1 VIEW COMPARISON:  Chest x-ray dated Mar 01, 2021. FINDINGS: The heart size and mediastinal contours are within normal limits. Both lungs are clear. The visualized skeletal structures are unremarkable. IMPRESSION: No active disease. Electronically Signed   By: Titus Dubin M.D.   On: 09/02/2022 22:05    Assessment and Plan  Shane [name] is a 51 y.o. male with medical history significant of ADHD, HTN, ADD, mild asthma, and depression but not currently on any medication who is being admitted to the hospitalist service s/p cardiac cath for acute inferior STEMI.  Patient developed persistent chest pain after helping a friend move and presented by EMS. Code STEMI was called after arrival in the ED with initial troponin of 214 and EKG showing inferior ST changes and frequent PVCs.. he had emergent cardiac cath    Acute ST elevation myocardial infarction (STEMI) of inferolateral wall (HCC) --Continue orders as placed by cardiologist, Dr. Ellyn Hack: Aspirin 81 and Brilinta, Lipitor 80 mg,  hydralazine and labetalol as needed BP and metoprolol 25 twice today and Toprol XL 50 mg qd from am --  Patient received Aggrastat for 12 hours --Nitroglycerin sublingual as needed chest pain with morphine for breakthrough  PVC's/NSVT --pt was on IV amiodarone gtt--now d/ced   HTN (hypertension) BP is controlled Patient has been started on metoprolol for STEMI   Depression Continue sertraline with mirtazapine at bedtime   Mild persistent asthma without complication Not exacerbated.  Albuterol as needed   ADD (attention deficit disorder) Holding Adderall for now  Monitor for 1 more day per Dr Oval Linsey   Procedures:Cardiac Cath Family communication :none Consults :[facility] CODE STATUS: FULL DVT Prophylaxis :heparin Level of care: Telemetry Cardiac Status is: Inpatient Remains inpatient appropriate because: STEMI  D/c in am  TOTAL TIME TAKING CARE OF THIS PATIENT: 35 minutes.  >50% time spent on counselling and coordination of care  Note: This dictation was prepared with Dragon dictation along with smaller phrase technology. Any transcriptional errors that result from this process are unintentional.  Fritzi Mandes M.D    [facility]   CC: Primary care physician; Burnard Hawthorne, FNP

## 2022-09-05 DIAGNOSIS — I2119 ST elevation (STEMI) myocardial infarction involving other coronary artery of inferior wall: Secondary | ICD-10-CM | POA: Diagnosis not present

## 2022-09-05 MED ORDER — ASPIRIN 81 MG PO CHEW: 81.0000 mg | CHEWABLE_TABLET | Freq: Every day | ORAL | 1 refills | Status: AC

## 2022-09-05 MED ORDER — ATORVASTATIN CALCIUM 80 MG PO TABS: 80.0000 mg | ORAL_TABLET | Freq: Every day | ORAL | 2 refills | Status: DC

## 2022-09-05 MED ORDER — METOPROLOL SUCCINATE ER 50 MG PO TB24: 50.0000 mg | ORAL_TABLET | Freq: Every day | ORAL | 2 refills | Status: DC

## 2022-09-05 MED ORDER — TICAGRELOR 90 MG PO TABS: 90.0000 mg | ORAL_TABLET | Freq: Two times a day (BID) | ORAL | 2 refills | Status: DC

## 2022-09-05 MED ORDER — DAPAGLIFLOZIN PROPANEDIOL 10 MG PO TABS: 10.0000 mg | ORAL_TABLET | Freq: Every day | ORAL | 2 refills | Status: DC

## 2022-09-05 NOTE — Discharge Summary (Signed)
Physician Discharge Summary   Patient: Shane [name] MRN: [id number] DOB: 15-Dec-1970  Admit date:     09/02/2022  Discharge date: 09/05/22  Discharge Physician: Enedina FinnerSona Narely Nobles   PCP: Allegra GranaArnett, Margaret G, FNP   Recommendations at discharge:    F/u Dr Kirke CorinArida in 1-2 weeks F/u PCP in 1-2 weeks  Discharge Diagnoses: Principal Problem:   Acute ST elevation myocardial infarction (STEMI) of inferolateral wall (HCC) Active Problems:   ADD (attention deficit disorder)   Mild persistent asthma without complication   Depression   HTN (hypertension)   Acute combined systolic and diastolic heart failure [facility](HCC)   Hospital Course:  Shane [name] is a 51 y.o. male with medical history significant of ADHD, HTN, ADD, mild asthma, and depression but not currently on any medication who is being admitted to the hospitalist service s/p cardiac cath for acute inferior STEMI.  Patient developed persistent chest pain after helping a friend move and presented by EMS. Code STEMI was called after arrival in the ED with initial troponin of 214 and EKG showing inferior ST changes and frequent PVCs.. he had emergent cardiac cath     Acute ST elevation myocardial infarction (STEMI) of inferolateral wall (HCC) --Continue orders as placed by cardiologist, Dr. Herbie BaltimoreHarding: Aspirin 81 and Brilinta, Lipitor 80 mg,  Toprol XL 50 mg qd --Nitroglycerin sublingual as needed --ambulated well in ICU --cardiac rehab as out pt --smoking cessation discussed   PVC's/NSVT --pt was on IV amiodarone gtt--now d/ced   HTN (hypertension) BP is controlled Patient has been started on metoprolol for STEMI   Depression Continue sertraline with mirtazapine at bedtime   Mild persistent asthma without complication Not exacerbated.  Albuterol as needed   ADD (attention deficit disorder) Resumed Adderall at d/c   D/c home. Pt agreeable.    Procedures:Cardiac Cath Family communication :none Consults :[facility]  Cardiology          Disposition: Home Diet recommendation:  Discharge Diet Orders (From admission, onward)     Start     Ordered   09/05/22 0000  Diet - low sodium heart healthy        09/05/22 0749           Cardiac diet DISCHARGE MEDICATION: Allergies as of 09/05/2022   No Known Allergies      Medication List     STOP taking these medications    mupirocin ointment 2 % Commonly known as: BACTROBAN       TAKE these medications    amphetamine-dextroamphetamine 20 MG tablet Commonly known as: Adderall Take 1 tablet (20 mg total) by mouth 2 (two) times daily. (Take 1 tablet (20 mg total) by mouth 2 (two) times daily.) What changed: Another medication with the same name was removed. Continue taking this medication, and follow the directions you see here.   aspirin 81 MG chewable tablet Chew 1 tablet (81 mg total) by mouth daily.   atorvastatin 80 MG tablet Commonly known as: LIPITOR Take 1 tablet (80 mg total) by mouth daily.   dapagliflozin propanediol 10 MG Tabs tablet Commonly known as: FARXIGA Take 1 tablet (10 mg total) by mouth daily.   metoprolol succinate 50 MG 24 hr tablet Commonly known as: TOPROL-XL Take 1 tablet (50 mg total) by mouth daily. Take with or immediately following a meal.   mirtazapine 15 MG tablet Commonly known as: REMERON TAKE 1 TABLET BY MOUTH AT BEDTIME.   nicotine 14 mg/24hr patch Commonly known as: NICODERM CQ - dosed in  mg/24 hours Place 1 patch (14 mg total) onto the skin daily.   sertraline 100 MG tablet Commonly known as: ZOLOFT TAKE 1 TABLET BY MOUTH DAILY   ticagrelor 90 MG Tabs tablet Commonly known as: BRILINTA Take 1 tablet (90 mg total) by mouth 2 (two) times daily.        Follow-up Information     Iran Ouch, MD. Schedule an appointment as soon as possible for a visit in 1 week(s).   Specialty: Cardiology Why: STEMI Contact information: [address] [place]  [address] [phone]         Allegra Grana, FNP. Schedule an appointment as soon as possible for a visit in 1 week(s).   Specialty: Family Medicine Why: post MI hospital f/u Contact information: [address] [place] [address] [phone]                Discharge Exam: Ceasar Mons Weights   09/02/22 2200  Weight: 93.4 kg  alert and oriented times three. Cardiovascular both heart sounds normal with rhythm regular respiratory clear to auscultation   Condition at discharge: fair  The results of significant diagnostics from this hospitalization (including imaging, microbiology, ancillary and laboratory) are listed below for reference.   Imaging Studies: ECHOCARDIOGRAM COMPLETE  Result Date: 09/03/2022    ECHOCARDIOGRAM REPORT   Patient Name:   Shane [name] Date of Exam: 09/03/2022 Medical Rec #:  [id number]          Height:       74.0 in Accession #:    [id number]         Weight:       205.9 lb Date of Birth:  05-Jun-1971          BSA:          2.201 m Patient Age:    51 years           BP:           124/94 mmHg Patient Gender: M                  HR:           78 bpm. Exam Location:  [facility] Procedure: 2D Echo, Color Doppler and Cardiac Doppler Indications:     I21.9 Myocardial infarction; I25.10 coronary artery disease                  Native Vessel  History:         Patient has no prior history of Echocardiogram examinations.                  Risk Factors:Hypertension and Dyslipidemia.  Sonographer:     Humphrey Rolls Referring Phys:  [id number] DAVID W HARDING Diagnosing Phys: Julien Nordmann MD IMPRESSIONS  1. Left ventricular ejection fraction, by estimation, is 35 to 40%. The left ventricle has moderately decreased function. The left ventricle demonstrates global hypokinesis. Left ventricular diastolic parameters are consistent with Grade I diastolic dysfunction (impaired relaxation).  2. Right ventricular systolic function is mildly reduced. The right ventricular size is  normal. There is normal pulmonary artery systolic pressure. The estimated right ventricular systolic pressure is 31.8 mmHg.  3. The mitral valve is normal in structure. No evidence of mitral valve regurgitation. No evidence of mitral stenosis.  4. The aortic valve is normal in structure. Aortic valve regurgitation is not visualized. Aortic valve sclerosis is present, with no evidence of aortic valve stenosis.  5. The inferior vena cava is dilated in size with <50% respiratory variability, suggesting right atrial pressure of 15 mmHg. FINDINGS  Left Ventricle: Left ventricular ejection fraction, by estimation, is 35 to 40%. The left ventricle has moderately decreased function. The left ventricle demonstrates global hypokinesis. The left ventricular internal cavity size was normal in size. There is no left ventricular hypertrophy. Left ventricular diastolic parameters are consistent with Grade I diastolic dysfunction (impaired relaxation). Right Ventricle: The right ventricular size is normal. No increase in right ventricular wall thickness. Right ventricular systolic function is mildly reduced. There is normal pulmonary artery systolic pressure. The tricuspid regurgitant velocity is 2.05 m/s, and with an assumed right atrial pressure of 15 mmHg, the estimated right ventricular systolic pressure is 31.8 mmHg. Left Atrium: Left atrial size was normal in size. Right Atrium: Right atrial size was normal in size. Pericardium: There is no evidence of pericardial effusion. Mitral Valve: The mitral valve is normal in structure. No evidence of mitral valve regurgitation. No evidence of mitral valve stenosis. Tricuspid Valve: The tricuspid valve is normal in structure. Tricuspid valve regurgitation is mild . No evidence of tricuspid stenosis. Aortic Valve: The aortic valve is normal in structure. Aortic valve regurgitation is not visualized. Aortic valve sclerosis is present, with no evidence of aortic valve stenosis. Aortic  valve mean gradient measures 3.0 mmHg. Aortic valve peak gradient measures 4.3 mmHg. Aortic valve area, by VTI measures 2.30 cm. Pulmonic Valve: The pulmonic valve was normal in structure. Pulmonic valve regurgitation is not visualized. No evidence of pulmonic stenosis. Aorta: The aortic root is normal in size and structure. Venous: The inferior vena cava is dilated in size with less than 50% respiratory variability, suggesting right atrial pressure of 15 mmHg. IAS/Shunts: No atrial level shunt detected by color flow Doppler.  LEFT VENTRICLE PLAX 2D LVIDd:         4.50 cm   Diastology LVIDs:         3.70 cm   LV e' medial:    5.00 cm/s LV PW:         1.20 cm   LV E/e' medial:  10.0 LV IVS:        1.00 cm   LV e' lateral:   5.55 cm/s LVOT diam:     2.20 cm   LV E/e' lateral: 9.0 LV SV:         48 LV SV Index:   22 LVOT Area:     3.80 cm  RIGHT VENTRICLE RV Basal diam:  3.00 cm RV Mid diam:    3.20 cm RV S prime:     6.42 cm/s LEFT ATRIUM             Index        RIGHT ATRIUM           Index LA diam:        3.90 cm 1.77 cm/m   RA Area:     12.30 cm LA Vol (A2C):   26.4 ml 12.00 ml/m  RA Volume:   30.20 ml  13.72 ml/m LA Vol (A4C):   30.7 ml 13.95 ml/m LA Biplane Vol: 30.2 ml 13.72 ml/m  AORTIC VALVE                    PULMONIC VALVE AV Area (Vmax):    2.54 cm     PV Vmax:       0.76 m/s AV Area (Vmean):   2.43 cm  PV Vmean:      54.100 cm/s AV Area (VTI):     2.30 cm     PV VTI:        0.157 m AV Vmax:           104.00 cm/s  PV Peak grad:  2.3 mmHg AV Vmean:          79.700 cm/s  PV Mean grad:  1.0 mmHg AV VTI:            0.210 m AV Peak Grad:      4.3 mmHg AV Mean Grad:      3.0 mmHg LVOT Vmax:         69.50 cm/s LVOT Vmean:        50.900 cm/s LVOT VTI:          0.127 m LVOT/AV VTI ratio: 0.60  AORTA Ao Root diam: 3.60 cm MITRAL VALVE               TRICUSPID VALVE MV Area (PHT): 4.44 cm    TR Peak grad:   16.8 mmHg MV Decel Time: 171 msec    TR Vmax:        205.00 cm/s MV E velocity: 49.80 cm/s MV A  velocity: 72.80 cm/s  SHUNTS MV E/A ratio:  0.68        Systemic VTI:  0.13 m                            Systemic Diam: 2.20 cm Julien Nordmann MD Electronically signed by Julien Nordmann MD Signature Date/Time: 09/03/2022/12:40:15 PM    Final    CARDIAC CATHETERIZATION  Addendum Date: 09/03/2022     Prox RCA to Mid RCA lesion is 100% stenosed.   Balloon angioplasty was performed using a BALLN TREK RX 3.0X20.   Aspiration thrombectomy performed using Penumbra catheter   A drug-eluting stent was successfully placed using a STENT ONYX FRONTIER 3.5X38.  Postdilated to 3.6 mm   A 2nd drug-eluting stent was successfully placed overlapping the previous stent proximally to cover the entire lesion, using a STENT ONYX FRONTIER 3.5X30.  Postdilated to 3.65 mm   Post intervention, there is a 0% residual stenosis for both stents.   -------------------------   Mid RCA to Dist RCA lesion is 95% stenosed with 95% stenosed side branch in RPAV.   Balloon angioplasty was performed using a BALLN TREK RX 3.0X20 -with minimal benefit.   Aspiration thrombectomy performed using Penumbra catheter from the distal RCA although into the PL branch restoring TIMI-3 flow.   Post intervention, there is a 0% residual stenosis. Post intervention, the side branch was reduced to 0% residual stenosis.   1st RPL lesion is 100% stenosed.  RPDA lesion is 100% stenosed -slow flow to the apex because of distal embolization.   -------------------------   Normal Left Coronary System   There is mild to moderate left ventricular systolic dysfunction.  The left ventricular ejection fraction is 45-50% by visual estimate -> basal to mid inferior wall severe hypo to akinesis   LV end diastolic pressure is moderately elevated.-24 mmHg   There is no aortic valve stenosis. POST-CATH DIAGNOSES Acute Inferolateral STEMI-suspect possible RV involvement with mildly reduced EF and basal to mid inferior akinesis to severe hypokinesis.  EF estimated roughly 45% CULPRIT  Lesion is 100% near still RCA heavily thrombotic occlusion with extensive thrombus throughout the entire RCA requiring Penumbra thrombectomy to both the proximal  and distal portion of the vessel finally restoring TIMI-3 flow to all but the very apical portions of the RPL and PDA. => The proximal to mid will disease vessel had received treated with 2 overlapping Onyx Frontier DES Stents (3.5 mm 38 mm and 3.5 mm x 30 mm with roughly 2 to 3 mm overlap. Stents were post-dilated to 3.65 mm) => TIMI 0 flow reduced to TIMI-3 flow with 0% residual stenosis Angiographically normal Left Coronary System with a non--dominant Cx and a Large Ramus Intermedius that is the same size as it is a wraparound LAD. Temporary hypotension not unexpected with RV infarct, responded well to 2 500 mL boluses and short run of Levophed that was reduced and weaned off prior to leaving the Cath Lab.  Blood pressures were in the 110/80 range. RECOMMENDATIONS Admit to ICU-we will continue Aggrastat for 12 hours post PCI Check 2D echo in the morning-(already ordered) Cycle troponins with troponin level in the morning. We will start low-dose metoprolol 12.5 mg twice daily-titrate if possible. High-dose statin-check lipid panel in the morning. Smoke cessation counseling-he says that he will quit smoking. Bryan Lemma, MD  Result Date: 09/03/2022   Prox RCA to Mid RCA lesion is 100% stenosed.   Balloon angioplasty was performed using a BALLN TREK RX 3.0X20.   Aspiration thrombectomy performed using Penumbra catheter   A drug-eluting stent was successfully placed using a STENT ONYX FRONTIER 3.5X38.  Postdilated to 3.6 mm   A 2nd drug-eluting stent was successfully placed overlapping the previous stent proximally to cover the entire lesion, using a STENT ONYX FRONTIER 3.5X30.  Postdilated to 3.65 mm   Post intervention, there is a 0% residual stenosis for both stents.   -------------------------   Mid RCA to Dist RCA lesion is 95% stenosed with 95%  stenosed side branch in RPAV.   Balloon angioplasty was performed using a BALLN TREK RX 3.0X20 -with minimal benefit.   Aspiration thrombectomy performed using Penumbra catheter from the distal RCA although into the PL branch restoring TIMI-3 flow.   Post intervention, there is a 0% residual stenosis. Post intervention, the side branch was reduced to 0% residual stenosis.   1st RPL lesion is 100% stenosed.  RPDA lesion is 100% stenosed -slow flow to the apex because of distal embolization.   -------------------------   Normal Left Coronary System   There is mild to moderate left ventricular systolic dysfunction.  The left ventricular ejection fraction is 45-50% by visual estimate -> basal to mid inferior wall severe hypo to akinesis   LV end diastolic pressure is moderately elevated.-24 mmHg   There is no aortic valve stenosis. POST-CATH DIAGNOSES Acute Inferolateral STEMI-suspect possible RV involvement with mildly reduced EF and basal to mid inferior akinesis to severe hypokinesis.  EF estimated roughly 45% CULPRIT Lesion is 100% near still RCA heavily thrombotic occlusion with extensive thrombus throughout the entire RCA requiring Penumbra thrombectomy to both the proximal and distal portion of the vessel finally restoring TIMI-3 flow to all but the very apical portions of the RPL and PDA. => The proximal to mid will disease vessel had received treated with 2 overlapping Onyx Frontier DES Stents (3.5 mm 38 mm and 3.5 mm x 30 mm with roughly 2 to 3 mm overlap. Stents were post-dilated to 3.65 mm) => TIMI 0 flow reduced to TIMI-3 flow with 0% residual stenosis Angiographically normal Left Coronary System with a non--dominant Cx and a Large Ramus Intermedius that is the same size as it is  a wraparound LAD. Temporary hypotension not unexpected with RV infarct, responded well to 2 500 mL boluses and short run of Levophed that was reduced and weaned off prior to leaving the Cath Lab.  Blood pressures were in the 110/80  range. RECOMMENDATIONS Admit to ICU-we will continue Aggrastat for 12 hours post PCI Check 2D echo in the morning-(already ordered) Cycle troponins with troponin level in the morning. We will start low-dose metoprolol 12.5 mg twice daily-titrate if possible. High-dose statin-check lipid panel in the morning. Smoke cessation counseling-he says that he will quit smoking. Bryan Lemma, MD  DG Chest Portable 1 View  Result Date: 09/02/2022 CLINICAL DATA:  Chest pain. STEMI earlier today. Status post cardiac catheterization. EXAM: PORTABLE CHEST 1 VIEW COMPARISON:  Chest x-ray dated Mar 01, 2021. FINDINGS: The heart size and mediastinal contours are within normal limits. Both lungs are clear. The visualized skeletal structures are unremarkable. IMPRESSION: No active disease. Electronically Signed   By: Obie Dredge M.D.   On: 09/02/2022 22:05    Microbiology: Results for orders placed or performed during the hospital encounter of 09/02/22  MRSA Next Gen by PCR, Nasal     Status: None   Collection Time: 09/02/22  9:47 PM   Specimen: Nasal Mucosa; Nasal Swab  Result Value Ref Range Status   MRSA by PCR Next Gen NOT DETECTED NOT DETECTED Final    Comment: (NOTE) The GeneXpert MRSA Assay (FDA approved for NASAL specimens only), is one component of a comprehensive MRSA colonization surveillance program. It is not intended to diagnose MRSA infection nor to guide or monitor treatment for MRSA infections. Test performance is not FDA approved in patients less than 47 years old. Performed at [facility], [address] [place], [place] [address]     Labs: CBC: Recent Labs  Lab 09/02/22 1936 09/02/22 2156 09/03/22 0439  WBC 12.5* 9.8 9.2  NEUTROABS 11.2*  --   --   HGB 16.4 15.7 15.0  HCT 49.4 45.5 46.2  MCV 96.9 95.8 99.1  PLT 290 262 279   Basic Metabolic Panel: Recent Labs  Lab 09/02/22 1936 09/02/22 2156 09/03/22 0439  NA 137  --  136  K 4.1  --  3.7  CL 104  --   106  CO2 24  --  23  GLUCOSE 148*  --  140*  BUN 14  --  15  CREATININE 0.92 0.79 0.68  CALCIUM 9.1  --  8.5*   Liver Function Tests: No results for input(s): "AST", "ALT", "ALKPHOS", "BILITOT", "PROT", "ALBUMIN" in the last 168 hours. CBG: No results for input(s): "GLUCAP" in the last 168 hours.  Discharge time spent: greater than 30 minutes.  Signed: Enedina Finner, MD [facility] 09/05/2022

## 2022-09-05 NOTE — Plan of Care (Signed)
  Problem: Education: Goal: Knowledge of General Education information will improve Description Including pain rating scale, medication(s)/side effects and non-pharmacologic comfort measures Outcome: Progressing   Problem: Clinical Measurements: Goal: Ability to maintain clinical measurements within normal limits will improve Outcome: Progressing   Problem: Activity: Goal: Risk for activity intolerance will decrease Outcome: Progressing   

## 2022-09-06 ENCOUNTER — Telehealth: Payer: Self-pay

## 2022-09-06 ENCOUNTER — Other Ambulatory Visit: Payer: Self-pay

## 2022-09-06 DIAGNOSIS — I2119 ST elevation (STEMI) myocardial infarction involving other coronary artery of inferior wall: Secondary | ICD-10-CM

## 2022-09-06 DIAGNOSIS — I5041 Acute combined systolic (congestive) and diastolic (congestive) heart failure: Secondary | ICD-10-CM

## 2022-09-06 MED ORDER — DAPAGLIFLOZIN PROPANEDIOL 10 MG PO TABS
10.0000 mg | ORAL_TABLET | Freq: Every day | ORAL | 2 refills | Status: DC
  Filled 2022-09-06: qty 30, 30d supply, fill #0
  Filled 2022-10-07: qty 30, 30d supply, fill #1
  Filled 2022-11-17: qty 30, 30d supply, fill #2
  Filled 2023-01-25: qty 30, 30d supply, fill #3
  Filled 2023-03-03: qty 30, 30d supply, fill #4

## 2022-09-06 MED ORDER — TICAGRELOR 90 MG PO TABS
90.0000 mg | ORAL_TABLET | Freq: Two times a day (BID) | ORAL | 1 refills | Status: DC
  Filled 2022-09-06: qty 60, 30d supply, fill #0
  Filled 2022-10-07: qty 60, 30d supply, fill #1

## 2022-09-06 MED ORDER — METOPROLOL SUCCINATE ER 50 MG PO TB24
50.0000 mg | ORAL_TABLET | Freq: Every day | ORAL | 2 refills | Status: DC
  Filled 2022-09-06: qty 30, 30d supply, fill #0
  Filled 2022-10-07: qty 30, 30d supply, fill #1
  Filled 2022-11-17: qty 30, 30d supply, fill #2

## 2022-09-06 NOTE — Telephone Encounter (Signed)
Patient states that he was released from the hospital yesterday and they called a prescription in for him to [facility] in [place] for dapagliflozin propanediol (FARXIGA) 10 MG TABS tablet and ticagrelor (BRILINTA) 90 MG TABS tablet.  Patient states this medication will cost him $500-600 at [facility], so the pharmacist gave him enough for one day.  Patient states he would like for [place] to please call these prescriptions in to the [facility] for him today.  Patient states his discharge summary states he needs to be taking metoprolol succinate (TOPROL-XL) 50 MG 24 hr tablet also, so he would like to see if we will call this in to [facility] today as well.

## 2022-09-06 NOTE — Telephone Encounter (Signed)
Call pt  I refilled toprol, farxiga, brillanta to [facility]

## 2022-09-06 NOTE — Telephone Encounter (Signed)
Transition Care Management Follow-up Telephone Call Date of discharge and from where: [place] 09/05/2022 How have you been since you were released from the hospital? better Any questions or concerns? No  Items Reviewed: Did the pt receive and understand the discharge instructions provided? Yes  Medications obtained and verified? No issue with pharmacy Other? No  Any new allergies since your discharge? No  Dietary orders reviewed? Yes Do you have support at home? Yes   Home Care and Equipment/Supplies: Were home health services ordered? no If so, what is the name of the agency? N/a  Has the agency set up a time to come to the patient's home? not applicable Were any new equipment or medical supplies ordered?  No What is the name of the medical supply agency? N/a Were you able to get the supplies/equipment? not applicable Do you have any questions related to the use of the equipment or supplies? No  Functional Questionnaire: (I = Independent and D = Dependent) ADLs: I  Bathing/Dressing- I  Meal Prep- I  Eating- I  Maintaining continence- I  Transferring/Ambulation- I  Managing Meds- I  Follow up appointments reviewed:  PCP Hospital f/u appt confirmed? Yes  Scheduled to see Rennie Plowman on 09/13/2022 @ 11:30. Specialist Hospital f/u appt confirmed? Yes  Scheduled to see Dr Kirke Corin on 09/16/2022 @ 11:30. Are transportation arrangements needed? No  If their condition worsens, is the pt aware to call PCP or go to the Emergency Dept.? Yes Was the patient provided with contact information for the PCP's office or ED? Yes Was to pt encouraged to call back with questions or concerns? Yes  Karena Addison, LPN [facility] Nurse Health Advisor Direct Dial [phone]

## 2022-09-06 NOTE — Telephone Encounter (Signed)
Spoke to pt  and informed him that the rx's had been sen in to [facility] and they would let him know when they are ready for pickup.

## 2022-09-08 ENCOUNTER — Encounter: Payer: Self-pay | Admitting: Family

## 2022-09-13 ENCOUNTER — Encounter: Payer: Self-pay | Admitting: Family

## 2022-09-13 ENCOUNTER — Ambulatory Visit: Payer: BC Managed Care – PPO | Admitting: Family

## 2022-09-13 VITALS — BP 128/82 | HR 71 | Temp 98.2°F | Wt 199.0 lb

## 2022-09-13 DIAGNOSIS — I2119 ST elevation (STEMI) myocardial infarction involving other coronary artery of inferior wall: Secondary | ICD-10-CM | POA: Diagnosis not present

## 2022-09-13 LAB — BASIC METABOLIC PANEL
BUN: 15 mg/dL (ref 6–23)
CO2: 27 mEq/L (ref 19–32)
Calcium: 10.2 mg/dL (ref 8.4–10.5)
Chloride: 100 mEq/L (ref 96–112)
Creatinine, Ser: 0.98 mg/dL (ref 0.40–1.50)
GFR: 90.42 mL/min (ref >60.00)
Glucose, Bld: 112 mg/dL — ABNORMAL HIGH (ref 70–99)
Potassium: 4.6 mEq/L (ref 3.5–5.1)
Sodium: 137 mEq/L (ref 135–145)

## 2022-09-13 NOTE — Assessment & Plan Note (Signed)
Hospitalization course received.  Medications reconciled.  Patient is doing quite well on therapy.  He has follow-up appointment with cardiology.  Will follow.  Pending hepatic enzymes, lipid panel in 6 weeks time.  Continue current medication regimen

## 2022-09-13 NOTE — Progress Notes (Signed)
Subjective:    Patient ID: Shane [name], male    DOB: September 28, 1971, 51 y.o.   MRN: [id number]  CC: [name] is a 51 y.o. male who presents today for hospital follow up.   HPI: Feels well today.  No chest pain shortness of breath or fatigue.   He has stopped smoking without cravings.   Follow-up with cardiology this week. Patient presented emergency room 09/02/2022 left-sided chest wall after helping a friend move furniture.  Inferior STEMI.  urgent PCI and two drug eluding stents.  Status post MI cardiomyopathy with reduced ejection fraction.  He is compliant with metoprolol cardiology may consider ARB or spironolactone.  He is no longer on amiodarone.  Compliant Lipitor 80 mg, aspirin 81 mg, Brilinta 90mg  BID, and farxiga 10mg  QD.    Echocardiogram 09/03/2022 shows ejection fraction 35-40 % HISTORY:  Past Medical History:  Diagnosis Date   ADD (attention deficit disorder)    Alcohol abuse    History of.   Anxiety    Depression    Hyperlipidemia    Hypertension    Past Surgical History:  Procedure Laterality Date   CORONARY/GRAFT ACUTE MI REVASCULARIZATION N/A 09/02/2022   Procedure: Coronary/Graft Acute MI Revascularization;  Surgeon: 11-04-1988, MD;  Location: [facility] INVASIVE CV LAB;  Service: Cardiovascular;  Laterality: N/A;   LEFT HEART CATH AND CORS/GRAFTS ANGIOGRAPHY N/A 09/02/2022   Procedure: LEFT HEART CATH AND CORS/GRAFTS ANGIOGRAPHY;  Surgeon: Marykay Lex, MD;  Location: [facility] INVASIVE CV LAB;  Service: Cardiovascular;  Laterality: N/A;   TENDON REPAIR Right 09/15/2015   Procedure: RIGHT FOREARM EXPLORATION AND CLOSURE OF LACERATION, TENDON REPAIR;  Surgeon: Marykay Lex, MD;  Location: [facility];  Service: Orthopedics;  Laterality: Right;   Family History  Problem Relation Age of Onset   Prostate cancer Father    Hyperlipidemia Father    Hypertension Father    Lung cancer Maternal Grandfather    Heart attack Maternal Grandfather 21        died   Alcohol abuse Paternal Grandfather    Hypertension Cousin    Hyperlipidemia Other    Heart attack Paternal Uncle 2       died   Heart attack Maternal Great-grandfather 30   Alcohol abuse Maternal Aunt     Allergies: Patient has no known allergies. Current Outpatient Medications on File Prior to Visit  Medication Sig Dispense Refill   aspirin 81 MG chewable tablet Chew 1 tablet (81 mg total) by mouth daily. 30 tablet 1   atorvastatin (LIPITOR) 80 MG tablet Take 1 tablet (80 mg total) by mouth daily. 30 tablet 2   dapagliflozin propanediol (FARXIGA) 10 MG TABS tablet Take 1 tablet (10 mg total) by mouth daily. 90 tablet 2   metoprolol succinate (TOPROL-XL) 50 MG 24 hr tablet Take 1 tablet (50 mg total) by mouth daily. Take with or immediately following a meal. 90 tablet 2   mirtazapine (REMERON) 15 MG tablet TAKE 1 TABLET BY MOUTH AT BEDTIME. 90 tablet 2   nicotine (NICODERM CQ - DOSED IN MG/24 HOURS) 14 mg/24hr patch Place 1 patch (14 mg total) onto the skin daily. 28 patch 5   sertraline (ZOLOFT) 100 MG tablet TAKE 1 TABLET BY MOUTH DAILY 90 tablet 2   ticagrelor (BRILINTA) 90 MG TABS tablet Take 1 tablet (90 mg total) by mouth 2 (two) times daily. 60 tablet 1   amphetamine-dextroamphetamine (ADDERALL) 20 MG tablet Take 1 tablet (20 mg total) by mouth 2 (  two) times daily. 60 tablet 0   [DISCONTINUED] dapagliflozin propanediol (FARXIGA) 10 MG TABS tablet Take 1 tablet (10 mg total) by mouth daily. 30 tablet 2   [DISCONTINUED] metoprolol succinate (TOPROL-XL) 50 MG 24 hr tablet Take 1 tablet (50 mg total) by mouth daily. Take with or immediately following a meal. (Patient not taking: Reported on 09/06/2022) 30 tablet 2   No current facility-administered medications on file prior to visit.    Social History   Tobacco Use   Smoking status: Every Day    Packs/day: 0.25    Types: Cigarettes   Smokeless tobacco: Never  Substance Use Topics   Alcohol use: No    Comment: no alcohol  since 12/16/2012   Drug use: No    Review of Systems  Constitutional:  Negative for chills, fatigue and fever.  Respiratory:  Negative for cough.   Cardiovascular:  Negative for chest pain and palpitations.  Gastrointestinal:  Negative for nausea and vomiting.      Objective:    BP 128/82   Pulse 71   Temp 98.2 F (36.8 C) (Oral)   Wt 199 lb (90.3 kg)   SpO2 99%   BMI 25.55 kg/m  BP Readings from Last 3 Encounters:  09/13/22 128/82  09/05/22 106/86  07/05/22 130/84   Wt Readings from Last 3 Encounters:  09/13/22 199 lb (90.3 kg)  09/02/22 205 lb 14.6 oz (93.4 kg)  07/05/22 208 lb 6.4 oz (94.5 kg)    Physical Exam Vitals reviewed.  Constitutional:      Appearance: He is well-developed.  Cardiovascular:     Rate and Rhythm: Regular rhythm.     Heart sounds: Normal heart sounds.  Pulmonary:     Effort: Pulmonary effort is normal. No respiratory distress.     Breath sounds: Normal breath sounds. No wheezing, rhonchi or rales.  Skin:    General: Skin is warm and dry.  Neurological:     Mental Status: He is alert.  Psychiatric:        Speech: Speech normal.        Behavior: Behavior normal.        Assessment & Plan:   Problem List Items Addressed This Visit       Cardiovascular and Mediastinum   Acute ST elevation myocardial infarction (STEMI) of inferolateral wall (HCC) - Primary    Hospitalization course received.  Medications reconciled.  Patient is doing quite well on therapy.  He has follow-up appointment with cardiology.  Will follow.  Pending hepatic enzymes, lipid panel in 6 weeks time.  Continue current medication regimen      Relevant Orders   Basic metabolic panel   Lipid panel   Hepatic function panel     I am having [name] maintain his sertraline, mirtazapine, nicotine, amphetamine-dextroamphetamine, aspirin, atorvastatin, dapagliflozin propanediol, metoprolol succinate, and ticagrelor.   No orders of the defined  types were placed in this encounter.   Return precautions given.   Risks, benefits, and alternatives of the medications and treatment plan prescribed today were discussed, and patient expressed understanding.   Education regarding symptom management and diagnosis given to patient on AVS.  Continue to follow with Allegra Grana, FNP for routine health maintenance.   [name] and I agreed with plan.   Rennie Plowman, FNP

## 2022-09-16 ENCOUNTER — Encounter: Payer: Self-pay | Admitting: Cardiovascular Disease

## 2022-09-16 ENCOUNTER — Ambulatory Visit: Payer: BC Managed Care – PPO | Attending: Cardiovascular Disease | Admitting: Cardiovascular Disease

## 2022-09-16 VITALS — BP 120/80 | HR 93 | Ht 74.0 in | Wt 203.1 lb

## 2022-09-16 DIAGNOSIS — Z720 Tobacco use: Secondary | ICD-10-CM

## 2022-09-16 DIAGNOSIS — E785 Hyperlipidemia, unspecified: Secondary | ICD-10-CM

## 2022-09-16 DIAGNOSIS — I2119 ST elevation (STEMI) myocardial infarction involving other coronary artery of inferior wall: Secondary | ICD-10-CM | POA: Diagnosis not present

## 2022-09-16 DIAGNOSIS — I10 Essential (primary) hypertension: Secondary | ICD-10-CM

## 2022-09-16 DIAGNOSIS — I255 Ischemic cardiomyopathy: Secondary | ICD-10-CM

## 2022-09-16 NOTE — Patient Instructions (Signed)
Medication Instructions:  No changes *If you need a refill on your cardiac medications before your next appointment, please call your pharmacy*   Lab Work: None ordered If you have labs (blood work) drawn today and your tests are completely normal, you will receive your results only by: MyChart Message (if you have MyChart) OR A paper copy in the mail If you have any lab test that is abnormal or we need to change your treatment, we will call you to review the results.   Testing/Procedures: None ordered   Follow-Up: At [facility], you and your health needs are our priority.  As part of our continuing mission to provide you with exceptional heart care, we have created designated Provider Care Teams.  These Care Teams include your primary Cardiologist (physician) and Advanced Practice Providers (APPs -  Physician Assistants and Nurse Practitioners) who all work together to provide you with the care you need, when you need it.  We recommend signing up for the patient portal called "MyChart".  Sign up information is provided on this After Visit Summary.  MyChart is used to connect with patients for Virtual Visits (Telemedicine).  Patients are able to view lab/test results, encounter notes, upcoming appointments, etc.  Non-urgent messages can be sent to your provider as well.   To learn more about what you can do with MyChart, go to [email or web address].    Your next appointment:   2 month(s)  The format for your next appointment:   In Person  Provider:   You may see Bryan Lemma, MD or one of the following Advanced Practice Providers on your designated Care Team:   Nicolasa Ducking, NP Eula Listen, PA-C Cadence Fransico [place], [place] Charlsie Quest, NP    Other Instructions You may return to work on January 2 nd

## 2022-09-16 NOTE — Progress Notes (Signed)
Cardiology Office Note   Date:  09/16/2022   ID:  Shane [name], DOB July 13, 1971, MRN [id number]  PCP:  Allegra Grana, FNP  Cardiologist:  Dr. Herbie Baltimore  Chief Complaint  Patient presents with   OTHER    STEMI. Meds reviewed verbally with pt.      History of Present Illness: Shane [name] is a 51 y.o. male who presents for a follow-up visit after recent presentation with inferior STEMI.  He has known history of tobacco use, ADD, anxiety and hyperlipidemia.  He recently presented with inferior STEMI with suspected right ventricular infarct.  Cardiac catheterization showed thrombotic occlusion of the right coronary artery with large thrombus burden.  This was treated successfully with aspiration thrombectomy and 2 overlapped drug-eluting stent placement.  No other obstructive disease.  Echocardiogram showed moderately reduced LV systolic function.  The patient has been doing well with no recent chest pain, shortness of breath or palpitations.  He was not started on ACE inhibitor or ARB due to relatively low blood pressure.  He quit smoking since his hospital discharge and reports no side effects to medications.  He denies excessive alcohol use. He works at [employer] and his job is relatively physical and requires heavy lifting at times.    Past Medical History:  Diagnosis Date   ADD (attention deficit disorder)    Alcohol abuse    History of.   Anxiety    Depression    Hyperlipidemia    Hypertension     Past Surgical History:  Procedure Laterality Date   CORONARY/GRAFT ACUTE MI REVASCULARIZATION N/A 09/02/2022   Procedure: Coronary/Graft Acute MI Revascularization;  Surgeon: Marykay Lex, MD;  Location: [facility] INVASIVE CV LAB;  Service: Cardiovascular;  Laterality: N/A;   LEFT HEART CATH AND CORS/GRAFTS ANGIOGRAPHY N/A 09/02/2022   Procedure: LEFT HEART CATH AND CORS/GRAFTS ANGIOGRAPHY;  Surgeon: Marykay Lex, MD;  Location: [facility] INVASIVE CV LAB;   Service: Cardiovascular;  Laterality: N/A;   TENDON REPAIR Right 09/15/2015   Procedure: RIGHT FOREARM EXPLORATION AND CLOSURE OF LACERATION, TENDON REPAIR;  Surgeon: Bradly Bienenstock, MD;  Location: [facility] OR;  Service: Orthopedics;  Laterality: Right;     Current Outpatient Medications  Medication Sig Dispense Refill   amphetamine-dextroamphetamine (ADDERALL) 20 MG tablet Take 1 tablet (20 mg total) by mouth 2 (two) times daily. 60 tablet 0   aspirin 81 MG chewable tablet Chew 1 tablet (81 mg total) by mouth daily. 30 tablet 1   atorvastatin (LIPITOR) 80 MG tablet Take 1 tablet (80 mg total) by mouth daily. 30 tablet 2   dapagliflozin propanediol (FARXIGA) 10 MG TABS tablet Take 1 tablet (10 mg total) by mouth daily. 90 tablet 2   metoprolol succinate (TOPROL-XL) 50 MG 24 hr tablet Take 1 tablet (50 mg total) by mouth daily. Take with or immediately following a meal. 90 tablet 2   mirtazapine (REMERON) 15 MG tablet TAKE 1 TABLET BY MOUTH AT BEDTIME. 90 tablet 2   sertraline (ZOLOFT) 100 MG tablet TAKE 1 TABLET BY MOUTH DAILY 90 tablet 2   ticagrelor (BRILINTA) 90 MG TABS tablet Take 1 tablet (90 mg total) by mouth 2 (two) times daily. 60 tablet 1   No current facility-administered medications for this visit.    Allergies:   Patient has no known allergies.    Social History:  The patient  reports that he quit smoking about 2 weeks ago. His smoking use included cigarettes. He smoked an average of .25  packs per day. He has never used smokeless tobacco. He reports that he does not drink alcohol and does not use drugs.   Family History:  The patient's family history includes Alcohol abuse in his maternal aunt and paternal grandfather; Heart attack (age of onset: 36) in his paternal uncle; Heart attack (age of onset: 106) in his maternal grandfather; Heart attack (age of onset: 46) in his maternal great-grandfather; Hyperlipidemia in his father and another family member; Hypertension in his cousin and  father; Lung cancer in his maternal grandfather; Prostate cancer in his father.    ROS:  Please see the history of present illness.   Otherwise, review of systems are positive for none.   All other systems are reviewed and negative.    PHYSICAL EXAM: VS:  BP 120/80 (BP Location: Left Arm, Patient Position: Sitting, Cuff Size: Normal)   Pulse 93   Ht 6\' 2"  (1.88 m)   Wt 203 lb 2 oz (92.1 kg)   SpO2 98%   BMI 26.08 kg/m  , BMI Body mass index is 26.08 kg/m. GEN: Well nourished, well developed, in no acute distress  HEENT: normal  Neck: no JVD, carotid bruits, or masses Cardiac: RRR; no murmurs, rubs, or gallops,no edema  Respiratory:  clear to auscultation bilaterally, normal work of breathing GI: soft, nontender, nondistended, + BS MS: no deformity or atrophy  Skin: warm and dry, no rash Neuro:  Strength and sensation are intact Psych: euthymic mood, full affect Right radial pulses normal with no hematoma.   EKG:  EKG is ordered today. The ekg ordered today demonstrates normal sinus rhythm with inferior T wave changes suggestive of ischemia.   Recent Labs: 10/02/2021: ALT 59; TSH 2.41 09/03/2022: Hemoglobin 15.0; Platelets 279 09/13/2022: BUN 15; Creatinine, Ser 0.98; Potassium 4.6; Sodium 137    Lipid Panel    Component Value Date/Time   CHOL 176 09/03/2022 0435   TRIG 132 09/03/2022 0435   HDL 37 (L) 09/03/2022 0435   CHOLHDL 4.8 09/03/2022 0435   VLDL 26 09/03/2022 0435   LDLCALC 113 (H) 09/03/2022 0435      Wt Readings from Last 3 Encounters:  09/16/22 203 lb 2 oz (92.1 kg)  09/13/22 199 lb (90.3 kg)  09/02/22 205 lb 14.6 oz (93.4 kg)          No data to display            ASSESSMENT AND PLAN:  1.  Recent inferior ST elevation myocardial infarction: The patient is doing reasonably well overall with no recurrent anginal symptoms.  Recommend continue dual antiplatelet therapy for at least 12 months.  I discussed with him the importance of  controlling his risk factors.  He has not started cardiac rehab yet and should start in the near future. Given that his job is very physical, I recommend that he stays off work for now.  He can resume work on January 2 without restrictions.  2.  Tobacco use: He quit smoking since his myocardial infarction.  I discussed with him the importance of complete abstinence.  3.  Hyperlipidemia: Continue high-dose atorvastatin.  His LDL was 113 before starting atorvastatin.  We should be able to get his LDL below 70.   4.  Ischemic cardiomyopathy: Moderately reduced LV systolic function post myocardial infarction.  Continue treatment with Toprol.  No evidence of volume overload.  Blood pressure continues on the low side and thus I did not add an ACE inhibitor or ARB.  Recommend repeating echocardiogram  in 3 months.   Disposition:   FU with Dr. Herbie Baltimore in 2 months.  Signed,  Lorine Bears, MD  09/16/2022 2:26 PM    [facility]

## 2022-09-22 ENCOUNTER — Telehealth: Payer: Self-pay | Admitting: Cardiovascular Disease

## 2022-09-22 ENCOUNTER — Telehealth: Payer: Self-pay

## 2022-09-22 NOTE — Telephone Encounter (Signed)
LMOV in regards to [employer] disability forms

## 2022-09-22 NOTE — Telephone Encounter (Signed)
LVM to call back to discuss FMLA paperwork

## 2022-10-01 NOTE — Telephone Encounter (Signed)
Follow Up:    Shane [name] is calling to see if they received patient's disability forms and what is the status of it?

## 2022-10-06 NOTE — Telephone Encounter (Signed)
The forms are in the bin at the front desk [place] for patient to come by office and fill out [employer] forms and pay $29 fee before forms will be processed

## 2022-10-07 ENCOUNTER — Other Ambulatory Visit: Payer: Self-pay | Admitting: Family

## 2022-10-07 ENCOUNTER — Other Ambulatory Visit: Payer: Self-pay

## 2022-10-07 DIAGNOSIS — F900 Attention-deficit hyperactivity disorder, predominantly inattentive type: Secondary | ICD-10-CM

## 2022-10-07 NOTE — Telephone Encounter (Signed)
Attempted to call back. Was unable to speak to anyone.

## 2022-10-08 ENCOUNTER — Other Ambulatory Visit: Payer: Self-pay

## 2022-10-08 ENCOUNTER — Other Ambulatory Visit: Payer: Self-pay | Admitting: Family

## 2022-10-08 DIAGNOSIS — F900 Attention-deficit hyperactivity disorder, predominantly inattentive type: Secondary | ICD-10-CM

## 2022-10-08 MED ORDER — AMPHETAMINE-DEXTROAMPHETAMINE 20 MG PO TABS
20.0000 mg | ORAL_TABLET | Freq: Two times a day (BID) | ORAL | 0 refills | Status: DC
  Filled 2022-10-08 – 2022-11-17 (×2): qty 60, 30d supply, fill #0

## 2022-10-08 MED ORDER — AMPHETAMINE-DEXTROAMPHETAMINE 20 MG PO TABS
20.0000 mg | ORAL_TABLET | Freq: Two times a day (BID) | ORAL | 0 refills | Status: DC
  Filled 2022-10-08 – 2023-01-25 (×3): qty 60, 30d supply, fill #0

## 2022-10-08 MED ORDER — AMPHETAMINE-DEXTROAMPHETAMINE 20 MG PO TABS
20.0000 mg | ORAL_TABLET | Freq: Two times a day (BID) | ORAL | 0 refills | Status: DC
  Filled 2022-10-08: qty 60, 30d supply, fill #0

## 2022-10-11 ENCOUNTER — Other Ambulatory Visit: Payer: Self-pay

## 2022-10-11 MED ORDER — ATORVASTATIN CALCIUM 80 MG PO TABS
80.0000 mg | ORAL_TABLET | Freq: Every day | ORAL | 2 refills | Status: DC
  Filled 2022-10-11: qty 30, 30d supply, fill #0

## 2022-10-14 DIAGNOSIS — Z0279 Encounter for issue of other medical certificate: Secondary | ICD-10-CM

## 2022-10-15 DIAGNOSIS — Z0279 Encounter for issue of other medical certificate: Secondary | ICD-10-CM

## 2022-10-15 NOTE — Telephone Encounter (Signed)
Patient came by office to fill out [employer] forms and paid $29 fee cash  Placed in nurse box

## 2022-10-21 NOTE — Telephone Encounter (Signed)
Forms have been placed in Dr. Allison Quarry box and he is the primary cardiologist.

## 2022-10-25 ENCOUNTER — Other Ambulatory Visit (INDEPENDENT_AMBULATORY_CARE_PROVIDER_SITE_OTHER): Payer: BC Managed Care – PPO

## 2022-10-25 DIAGNOSIS — I2119 ST elevation (STEMI) myocardial infarction involving other coronary artery of inferior wall: Secondary | ICD-10-CM | POA: Diagnosis not present

## 2022-10-25 LAB — HEPATIC FUNCTION PANEL
ALT: 36 U/L (ref 0–53)
AST: 25 U/L (ref 0–37)
Albumin: 4.2 g/dL (ref 3.5–5.2)
Alkaline Phosphatase: 139 U/L — ABNORMAL HIGH (ref 39–117)
Bilirubin, Direct: 0.2 mg/dL (ref 0.0–0.3)
Total Bilirubin: 0.7 mg/dL (ref 0.2–1.2)
Total Protein: 7.3 g/dL (ref 6.0–8.3)

## 2022-10-25 LAB — LIPID PANEL
Cholesterol: 172 mg/dL (ref 0–200)
HDL: 55.6 mg/dL (ref >39.00)
LDL Cholesterol: 101 mg/dL — ABNORMAL HIGH (ref 0–99)
NonHDL: 116.54
Total CHOL/HDL Ratio: 3
Triglycerides: 77 mg/dL (ref 0.0–149.0)
VLDL: 15.4 mg/dL (ref 0.0–40.0)

## 2022-10-26 ENCOUNTER — Other Ambulatory Visit: Payer: Self-pay | Admitting: Family

## 2022-10-26 DIAGNOSIS — E785 Hyperlipidemia, unspecified: Secondary | ICD-10-CM

## 2022-10-26 NOTE — Telephone Encounter (Signed)
Forms pulled from nurse bin.  These had been forwarded to Dr. Ellyn Hack to complete. This RN was out of the office last week so the forms are just now being addressed.   Attempted to contact the patient to find out what type of work he does/ if he is currently back to work. A cardiac rehab referral was placed, but I do not see they he has started this on the outpatient side.   No answer- I left a message to please call back.

## 2022-11-01 ENCOUNTER — Telehealth: Payer: Self-pay | Admitting: Cardiovascular Disease

## 2022-11-01 NOTE — Telephone Encounter (Signed)
Pt returning nurses call from 10/26/22 in regards to paperwork that was needing completion. Please advise

## 2022-11-02 NOTE — Telephone Encounter (Signed)
There is another encounter for this same issue. See documentation from phone note dated 09/22/22.

## 2022-11-02 NOTE — Telephone Encounter (Signed)
Attempted to call the patient. No answer- I left a detailed message on his voice mail (ok per DPR) that we are trying to complete his [employer] form and [facility] form. I advised that we are trying to find out: 1) If he is back to work.  2) If so, when did he return to work? 3) What are his job functions?  I asked that he call back to confirm.

## 2022-11-02 NOTE — Telephone Encounter (Signed)
I spoke with the patient. I advised him that we are attempting to complete his [employer] form and [employer] form. I have clarified with the patient that he did RTW on 10/07/22 per a note he obtained from his PCP. 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   The patient is aware I will review his forms with Dr. Ellyn Hack this week and we will notify him once completed.  The patient voices understanding and is agreeable.

## 2022-11-02 NOTE — Telephone Encounter (Signed)
[name], Shane T at 11/01/2022  1:34 PM  Status: Signed  Pt returning nurses call from 10/26/22 in regards to paperwork that was needing completion. Please advise

## 2022-11-04 NOTE — Telephone Encounter (Signed)
[employer] form & [facility] form completed and signed by Dr. Ellyn Hack.  Given to [employer].

## 2022-11-17 ENCOUNTER — Encounter: Payer: Self-pay | Admitting: Nurse Practitioner

## 2022-11-17 ENCOUNTER — Other Ambulatory Visit: Payer: Self-pay | Admitting: Family

## 2022-11-17 ENCOUNTER — Ambulatory Visit: Payer: BC Managed Care – PPO | Admitting: Nurse Practitioner

## 2022-11-17 ENCOUNTER — Ambulatory Visit: Payer: BC Managed Care – PPO | Attending: Nurse Practitioner | Admitting: Cardiovascular Disease

## 2022-11-17 ENCOUNTER — Other Ambulatory Visit: Payer: Self-pay

## 2022-11-17 ENCOUNTER — Encounter: Payer: Self-pay | Admitting: Cardiovascular Disease

## 2022-11-17 VITALS — BP 130/80 | HR 84 | Ht 74.0 in | Wt 207.0 lb

## 2022-11-17 DIAGNOSIS — Z720 Tobacco use: Secondary | ICD-10-CM | POA: Diagnosis not present

## 2022-11-17 DIAGNOSIS — I255 Ischemic cardiomyopathy: Secondary | ICD-10-CM

## 2022-11-17 DIAGNOSIS — I2119 ST elevation (STEMI) myocardial infarction involving other coronary artery of inferior wall: Secondary | ICD-10-CM

## 2022-11-17 DIAGNOSIS — I5022 Chronic systolic (congestive) heart failure: Secondary | ICD-10-CM

## 2022-11-17 DIAGNOSIS — I2510 Atherosclerotic heart disease of native coronary artery without angina pectoris: Secondary | ICD-10-CM | POA: Diagnosis not present

## 2022-11-17 DIAGNOSIS — E785 Hyperlipidemia, unspecified: Secondary | ICD-10-CM

## 2022-11-17 DIAGNOSIS — F900 Attention-deficit hyperactivity disorder, predominantly inattentive type: Secondary | ICD-10-CM

## 2022-11-17 MED ORDER — TICAGRELOR 90 MG PO TABS
90.0000 mg | ORAL_TABLET | Freq: Two times a day (BID) | ORAL | 0 refills | Status: DC
  Filled 2022-11-17: qty 60, 30d supply, fill #0
  Filled 2023-01-25: qty 60, 30d supply, fill #1
  Filled 2023-03-03: qty 60, 30d supply, fill #2

## 2022-11-17 MED ORDER — METOPROLOL SUCCINATE ER 50 MG PO TB24
50.0000 mg | ORAL_TABLET | Freq: Every day | ORAL | 0 refills | Status: DC
  Filled 2022-11-17: qty 90, 90d supply, fill #0
  Filled 2023-01-25: qty 30, 30d supply, fill #0

## 2022-11-17 MED ORDER — ROSUVASTATIN CALCIUM 40 MG PO TABS
40.0000 mg | ORAL_TABLET | Freq: Every day | ORAL | 0 refills | Status: DC
  Filled 2022-11-17: qty 30, 30d supply, fill #0
  Filled 2023-01-25: qty 30, 30d supply, fill #1
  Filled 2023-03-03: qty 30, 30d supply, fill #2

## 2022-11-17 NOTE — Progress Notes (Signed)
Cardiology Office Note   Date:  11/17/2022   ID:  Shane [name], DOB April 08, 1971, MRN [id number]  PCP:  Burnard Hawthorne, FNP  Cardiologist:  Dr. Ellyn Hack  Chief Complaint  Patient presents with   2 month follow up     "Doing well." Medications reviewed by the patient verbally.       History of Present Illness: Shane [name] is a 52 y.o. male who presents for a follow-up visit regarding coronary artery disease.    He has known history of tobacco use, ADD, anxiety and hyperlipidemia.   He presented in November 2023 with inferior STEMI with suspected right ventricular infarct.  Cardiac catheterization showed thrombotic occlusion of the right coronary artery with large thrombus burden.  This was treated successfully with aspiration thrombectomy and 2 overlapped drug-eluting stent placement.  No other obstructive disease.  Echocardiogram showed moderately reduced LV systolic function.     He was not started on ACE inhibitor or ARB due to relatively low blood pressure.  He quit smoking since his myocardial infarction.   He works at [employer].  He has been doing well with no chest pain, shortness of breath or palpitations.  Past Medical History:  Diagnosis Date   ADD (attention deficit disorder)    Alcohol abuse    History of.   Anxiety    CAD (coronary artery disease)    a. 08/2022 Inflat STEMI/PCI: LM nl, LAD nl, D1/2 nl, RI nl, LCX nl, OM1/2 nl, RCA 100p/m (thrombectomy & 3.5x38 & 3.5x30 overlapping Onyx Frontier DESs), 39md (thombectomy and PTCA into RPAV branch), RPDA 100, RPL1 100, EF 45-50%.   Chronic [facility]EF (heart failure with reduced ejection fraction) [place]    a. 09/2022 Echo: EF 35-40%, glob HK, GrI DD, mildly reduced RV fxn, RVSP 31.833mg, no significant valvular disease.   Depression    Hyperlipidemia    Hypertension    Ischemic cardiomyopathy    a. 09/2022 Echo: EF 35-40%.    Past Surgical History:  Procedure Laterality Date   CORONARY/GRAFT  ACUTE MI REVASCULARIZATION N/A 09/02/2022   Procedure: Coronary/Graft Acute MI Revascularization;  Surgeon: HaLeonie ManMD;  [facility];  Service: Cardiovascular;  Laterality: N/A;   LEFT HEART CATH AND CORS/GRAFTS ANGIOGRAPHY N/A 09/02/2022   Procedure: LEFT HEART CATH AND CORS/GRAFTS ANGIOGRAPHY;  Surgeon: HaLeonie ManMD;  [facility];  Service: Cardiovascular;  Laterality: N/A;   TENDON REPAIR Right 09/15/2015   Procedure: RIGHT FOREARM EXPLORATION AND CLOSURE OF LACERATION, TENDON REPAIR;  Surgeon: FrIran PlanasMD;  [facility]: [facility]opedics;  Laterality: Right;     Current Outpatient Medications  Medication Sig Dispense Refill   amphetamine-dextroamphetamine (ADDERALL) 20 MG tablet Take 1 tablet (20 mg total) by mouth 2 (two) times daily. 60 tablet 0   aspirin 81 MG chewable tablet Chew 1 tablet (81 mg total) by mouth daily. 30 tablet 1   dapagliflozin propanediol (FARXIGA) 10 MG TABS tablet Take 1 tablet (10 mg total) by mouth daily. 90 tablet 2   mirtazapine (REMERON) 15 MG tablet TAKE 1 TABLET BY MOUTH AT BEDTIME. 90 tablet 2   rosuvastatin (CRESTOR) 40 MG tablet Take 1 tablet (40 mg total) by mouth daily. 90 tablet 0   sertraline (ZOLOFT) 100 MG tablet TAKE 1 TABLET BY MOUTH DAILY 90 tablet 2   amphetamine-dextroamphetamine (ADDERALL) 20 MG tablet Take 1 tablet (20 mg total) by mouth 2 (two) times daily. (Patient not  taking: Reported on 11/17/2022) 60 tablet 0   amphetamine-dextroamphetamine (ADDERALL) 20 MG tablet Take 1 tablet (20 mg total) by mouth 2 (two) times daily. (Patient not taking: Reported on 11/17/2022) 60 tablet 0   metoprolol succinate (TOPROL-XL) 50 MG 24 hr tablet Take 1 tablet (50 mg total) by mouth daily. Take with or immediately following a meal. 90 tablet 0   ticagrelor (BRILINTA) 90 MG TABS tablet Take 1 tablet (90 mg total) by mouth 2 (two) times daily. 180 tablet 0   No current facility-administered  medications for this visit.    Allergies:   Patient has no known allergies.    Social History:  The patient  reports that he quit smoking about 2 months ago. His smoking use included cigarettes. He has a 5.00 pack-year smoking history. He has never used smokeless tobacco. He reports that he does not drink alcohol and does not use drugs.   Family History:  The patient's family history includes Alcohol abuse in his maternal aunt and paternal grandfather; Heart attack (age of onset: 6) in his paternal uncle; Heart attack (age of onset: 67) in his maternal grandfather; Heart attack (age of onset: 49) in his maternal great-grandfather; Hyperlipidemia in his father and another family member; Hypertension in his cousin and father; Lung cancer in his maternal grandfather; Prostate cancer in his father.    ROS:  Please see the history of present illness.   Otherwise, review of systems are positive for none.   All other systems are reviewed and negative.    PHYSICAL EXAM: VS:  BP 130/80 (BP Location: Left Arm, Patient Position: Sitting, Cuff Size: Normal)   Pulse 84   Ht 6' 2"$  (1.88 m)   Wt 207 lb (93.9 kg)   SpO2 97%   BMI 26.58 kg/m  , BMI Body mass index is 26.58 kg/m. GEN: Well nourished, well developed, in no acute distress  HEENT: normal  Neck: no JVD, carotid bruits, or masses Cardiac: RRR; no murmurs, rubs, or gallops,no edema  Respiratory:  clear to auscultation bilaterally, normal work of breathing GI: soft, nontender, nondistended, + BS MS: no deformity or atrophy  Skin: warm and dry, no rash Neuro:  Strength and sensation are intact Psych: euthymic mood, full affect    EKG:  EKG is not ordered today.    Recent Labs: 09/03/2022: Hemoglobin 15.0; Platelets 279 09/13/2022: BUN 15; Creatinine, Ser 0.98; Potassium 4.6; Sodium 137 10/25/2022: ALT 36    Lipid Panel    Component Value Date/Time   CHOL 172 10/25/2022 0736   TRIG 77.0 10/25/2022 0736   HDL 55.60 10/25/2022  0736   CHOLHDL 3 10/25/2022 0736   VLDL 15.4 10/25/2022 0736   LDLCALC 101 (H) 10/25/2022 0736      Wt Readings from Last 3 Encounters:  11/17/22 207 lb (93.9 kg)  09/16/22 203 lb 2 oz (92.1 kg)  09/13/22 199 lb (90.3 kg)          No data to display            ASSESSMENT AND PLAN:  1.  Coronary artery disease involving native coronary arteries without angina: Continue dual antiplatelet therapy at least until November of this year but preferably longer given large amount of thrombus in the right coronary artery.  2.  Tobacco use: He quit smoking since his myocardial infarction.    3.  Hyperlipidemia: He has not missed any dose of atorvastatin 80 mg daily.  In spite of that, his most recent lipid  profile showed an LDL of 101.  I would have expected a bigger drop in his LDL with this dose.  I elected to switch him to rosuvastatin 40 mg once daily.  He should get repeat lipid and liver profile in the next few months.  If LDL remains above 70, consider adding ezetimibe.  4.  Ischemic cardiomyopathy: Moderately reduced LV systolic function post myocardial infarction.  Continue treatment with Toprol.  No evidence of volume overload.  I requested a follow-up echocardiogram to be done in 1 month from now to evaluate ejection fraction.  Toprol was refilled today.   Disposition:   FU with Dr. Ellyn Hack in 3 months.  Signed,  Kathlyn Sacramento, MD  11/17/2022 4:01 PM    [facility]

## 2022-11-17 NOTE — Progress Notes (Deleted)
Office Visit    Patient Name: Shane [name] Date of Encounter: 11/17/2022  Primary Care Provider:  Burnard Hawthorne, FNP Primary Cardiologist:  Glenetta Hew, MD  Chief Complaint    52 year old male with a history of CAD status post inferolateral STEMI and RCA stenting in November 2023, ischemic cardiomyopathy, chronic HFrEF (EF 35 to 40% December 2023), hypertension, hyperlipidemia, attention deficit disorder, remote alcohol abuse, and tobacco abuse, who presents for follow-up of CAD.  Past Medical History    Past Medical History:  Diagnosis Date   ADD (attention deficit disorder)    Alcohol abuse    History of.   Anxiety    CAD (coronary artery disease)    a. 08/2022 Inflat STEMI/PCI: LM nl, LAD nl, D1/2 nl, RI nl, LCX nl, OM1/2 nl, RCA 100p/m (thrombectomy & 3.5x38 & 3.5x30 overlapping Onyx Frontier DESs), 57md (thombectomy and PTCA into RPAV branch), RPDA 100, RPL1 100, EF 45-50%.   Chronic [facility]EF (heart failure with reduced ejection fraction) [place]    a. 09/2022 Echo: EF 35-40%, glob HK, GrI DD, mildly reduced RV fxn, RVSP 31.849mg, no significant valvular disease.   Depression    Hyperlipidemia    Hypertension    Ischemic cardiomyopathy    a. 09/2022 Echo: EF 35-40%.   Past Surgical History:  Procedure Laterality Date   CORONARY/GRAFT ACUTE MI REVASCULARIZATION N/A 09/02/2022   Procedure: Coronary/Graft Acute MI Revascularization;  Surgeon: HaLeonie ManMD;  [facility];  Service: Cardiovascular;  Laterality: N/A;   LEFT HEART CATH AND CORS/GRAFTS ANGIOGRAPHY N/A 09/02/2022   Procedure: LEFT HEART CATH AND CORS/GRAFTS ANGIOGRAPHY;  Surgeon: HaLeonie ManMD;  [facility];  Service: Cardiovascular;  Laterality: N/A;   TENDON REPAIR Right 09/15/2015   Procedure: RIGHT FOREARM EXPLORATION AND CLOSURE OF LACERATION, TENDON REPAIR;  Surgeon: FrIran PlanasMD;  [facility] Service: Orthopedics;  Laterality: Right;     Allergies  No Known Allergies  History of Present Illness    [age]ear old male with the above complex past medical history including CAD, ischemic cardiomyopathy, chronic HFrEF, hypertension, hyperlipidemia, attention deficit disorder, and prior alcohol and tobacco abuse.  He was admitted in November 2023 after presenting with acute inferolateral STEMI with suspected right ventricular infarct.  Diagnostic catheterization revealed a thrombotic occlusion of the right coronary artery with large thrombus burden requiring thrombectomy and 2 drug-eluting stents in the proximal/mid RCA.  The mid to distal RCA into the PAB was also treated with thrombectomy and balloon angioplasty.  EF was 45 to 50% by ventriculography and 35 to 40% by echocardiogram.  Medical management during hospitalization was limited by hypotension which initially required norepinephrine.  Mr. GaTimpsonas last seen in cardiology clinic in mid December 2023, at which time he was doing well without symptoms or limitations with recommendation to begin cardiac rehabilitation.  He had quit smoking at that time.  Follow-up lipids and LFTs were notable for mild elevation of alkaline phosphatase (139), with ongoing elevation of LDL at 101  Home Medications    Current Outpatient Medications  Medication Sig Dispense Refill   amphetamine-dextroamphetamine (ADDERALL) 20 MG tablet Take 1 tablet (20 mg total) by mouth 2 (two) times daily. 60 tablet 0   amphetamine-dextroamphetamine (ADDERALL) 20 MG tablet Take 1 tablet (20 mg total) by mouth 2 (two) times daily. 60 tablet 0   amphetamine-dextroamphetamine (ADDERALL) 20 MG tablet Take 1 tablet (20 mg total) by mouth 2 (two) times daily. 60Burna  tablet 0   aspirin 81 MG chewable tablet Chew 1 tablet (81 mg total) by mouth daily. 30 tablet 1   atorvastatin (LIPITOR) 80 MG tablet Take 1 tablet (80 mg total) by mouth daily. 30 tablet 2   dapagliflozin propanediol (FARXIGA) 10 MG TABS tablet Take 1  tablet (10 mg total) by mouth daily. 90 tablet 2   metoprolol succinate (TOPROL-XL) 50 MG 24 hr tablet Take 1 tablet (50 mg total) by mouth daily. Take with or immediately following a meal. 90 tablet 2   mirtazapine (REMERON) 15 MG tablet TAKE 1 TABLET BY MOUTH AT BEDTIME. 90 tablet 2   sertraline (ZOLOFT) 100 MG tablet TAKE 1 TABLET BY MOUTH DAILY 90 tablet 2   ticagrelor (BRILINTA) 90 MG TABS tablet Take 1 tablet (90 mg total) by mouth 2 (two) times daily. 60 tablet 1   No current facility-administered medications for this visit.     Review of Systems    ***.  All other systems reviewed and are otherwise negative except as noted above.    Physical Exam    VS:  There were no vitals taken for this visit. , BMI There is no height or weight on file to calculate BMI.     GEN: Well nourished, well developed, in no acute distress. HEENT: normal. Neck: Supple, no JVD, carotid bruits, or masses. Cardiac: RRR, no murmurs, rubs, or gallops. No clubbing, cyanosis, edema.  Radials 2+/PT 2+ and equal bilaterally.  Respiratory:  Respirations regular and unlabored, clear to auscultation bilaterally. GI: Soft, nontender, nondistended, BS + x 4. MS: no deformity or atrophy. Skin: warm and dry, no rash. Neuro:  Strength and sensation are intact. Psych: Normal affect.  Accessory Clinical Findings    ECG personally reviewed by me today - *** - no acute changes.  Lab Results  Component Value Date   WBC 9.2 09/03/2022   HGB 15.0 09/03/2022   HCT 46.2 09/03/2022   MCV 99.1 09/03/2022   PLT 279 09/03/2022   Lab Results  Component Value Date   CREATININE 0.98 09/13/2022   BUN 15 09/13/2022   NA 137 09/13/2022   K 4.6 09/13/2022   CL 100 09/13/2022   CO2 27 09/13/2022   Lab Results  Component Value Date   ALT 36 10/25/2022   AST 25 10/25/2022   ALKPHOS 139 (H) 10/25/2022   BILITOT 0.7 10/25/2022   Lab Results  Component Value Date   CHOL 172 10/25/2022   HDL 55.60 10/25/2022    LDLCALC 101 (H) 10/25/2022   TRIG 77.0 10/25/2022   CHOLHDL 3 10/25/2022    Lab Results  Component Value Date   HGBA1C 5.8 (A) 07/05/2022    Assessment & Plan    1.  ***   Murray Hodgkins, NP 11/17/2022, 8:00 AM

## 2022-11-17 NOTE — Patient Instructions (Addendum)
Medication Instructions:  STOP the Atorvastatin  START Rosuvastatin 40 mg once daily  *If you need a refill on your cardiac medications before your next appointment, please call your pharmacy*   Lab Work: None ordered If you have labs (blood work) drawn today and your tests are completely normal, you will receive your results only by: [place] (if you have MyChart) OR A paper copy in the mail If you have any lab test that is abnormal or we need to change your treatment, we will call you to review the results.   Testing/Procedures: Your physician has requested that you have an echocardiogram. Echocardiography is a painless test that uses sound waves to create images of your heart. It provides your doctor with information about the size and shape of your heart and how well your heart's chambers and valves are working.   You may receive an ultrasound enhancing agent through an IV if needed to better visualize your heart during the echo. This procedure takes approximately one hour.  There are no restrictions for this procedure.  This will take place at [facility] [id number], [place]    Follow-Up: At [facility], you and your health needs are our priority.  As part of our continuing mission to provide you with exceptional heart care, we have created designated Provider Care Teams.  These Care Teams include your primary Cardiologist (physician) and Advanced Practice Providers (APPs -  Physician Assistants and Nurse Practitioners) who all work together to provide you with the care you need, when you need it.  We recommend signing up for the patient portal called "MyChart".  Sign up information is provided on this After Visit Summary.  MyChart is used to connect with patients for Virtual Visits (Telemedicine).  Patients are able to view lab/test results, encounter notes, upcoming appointments, etc.  Non-urgent messages can be sent to your  provider as well.   To learn more about what you can do with MyChart, go to [email or web address].    Your next appointment:   3 month(s)  Provider:   Dr. Ellyn Hack

## 2023-01-06 ENCOUNTER — Ambulatory Visit: Payer: BC Managed Care – PPO | Attending: Cardiovascular Disease

## 2023-01-06 DIAGNOSIS — I5022 Chronic systolic (congestive) heart failure: Secondary | ICD-10-CM | POA: Diagnosis not present

## 2023-01-07 LAB — ECHOCARDIOGRAM COMPLETE
AR max vel: 3.52 cm2
AV Area VTI: 3.35 cm2
AV Area mean vel: 3.35 cm2
AV Mean grad: 4 mmHg
AV Peak grad: 7.5 mmHg
Ao pk vel: 1.37 m/s
Area-P 1/2: 4.26 cm2
Calc EF: 38.9 %
S' Lateral: 4.35 cm
Single Plane A2C EF: 35.2 %
Single Plane A4C EF: 42.8 %

## 2023-01-21 ENCOUNTER — Telehealth: Payer: Self-pay | Admitting: Cardiovascular Disease

## 2023-01-21 NOTE — Telephone Encounter (Signed)
Patient aware of echo results. He call back to schedule appointment with Dr. Herbie Baltimore

## 2023-01-21 NOTE — Telephone Encounter (Signed)
Pt is calling back for echo results. Please c/b.

## 2023-01-25 ENCOUNTER — Other Ambulatory Visit: Payer: Self-pay | Admitting: Family

## 2023-01-25 ENCOUNTER — Other Ambulatory Visit: Payer: Self-pay

## 2023-01-25 DIAGNOSIS — I2119 ST elevation (STEMI) myocardial infarction involving other coronary artery of inferior wall: Secondary | ICD-10-CM

## 2023-01-25 NOTE — Telephone Encounter (Signed)
Last filled by cardiology. But previously filled by you. Are you okay continuing to refill this medication.

## 2023-02-03 ENCOUNTER — Encounter: Payer: Self-pay | Admitting: *Deleted

## 2023-02-17 ENCOUNTER — Encounter: Payer: Self-pay | Admitting: Cardiology

## 2023-02-17 ENCOUNTER — Ambulatory Visit: Payer: BC Managed Care – PPO | Attending: Cardiology | Admitting: Cardiology

## 2023-02-17 ENCOUNTER — Ambulatory Visit (INDEPENDENT_AMBULATORY_CARE_PROVIDER_SITE_OTHER): Payer: BC Managed Care – PPO

## 2023-02-17 ENCOUNTER — Other Ambulatory Visit
Admission: RE | Admit: 2023-02-17 | Discharge: 2023-02-17 | Disposition: A | Discharge status: Home / Self Care | Payer: BC Managed Care – PPO | Source: Ambulatory Visit | Attending: Cardiology | Admitting: Cardiology

## 2023-02-17 ENCOUNTER — Other Ambulatory Visit: Payer: Self-pay

## 2023-02-17 VITALS — BP 118/82 | HR 94 | Ht 74.0 in | Wt 199.1 lb

## 2023-02-17 DIAGNOSIS — I2119 ST elevation (STEMI) myocardial infarction involving other coronary artery of inferior wall: Secondary | ICD-10-CM

## 2023-02-17 DIAGNOSIS — I255 Ischemic cardiomyopathy: Secondary | ICD-10-CM

## 2023-02-17 DIAGNOSIS — I5042 Chronic combined systolic (congestive) and diastolic (congestive) heart failure: Secondary | ICD-10-CM | POA: Diagnosis not present

## 2023-02-17 DIAGNOSIS — I493 Ventricular premature depolarization: Secondary | ICD-10-CM

## 2023-02-17 DIAGNOSIS — Z79899 Other long term (current) drug therapy: Secondary | ICD-10-CM

## 2023-02-17 DIAGNOSIS — E785 Hyperlipidemia, unspecified: Secondary | ICD-10-CM

## 2023-02-17 DIAGNOSIS — I10 Essential (primary) hypertension: Secondary | ICD-10-CM

## 2023-02-17 DIAGNOSIS — I2510 Atherosclerotic heart disease of native coronary artery without angina pectoris: Secondary | ICD-10-CM | POA: Insufficient documentation

## 2023-02-17 DIAGNOSIS — Z955 Presence of coronary angioplasty implant and graft: Secondary | ICD-10-CM | POA: Diagnosis not present

## 2023-02-17 HISTORY — DX: Ventricular premature depolarization: I49.3

## 2023-02-17 HISTORY — DX: Ischemic cardiomyopathy: I25.5

## 2023-02-17 LAB — COMPREHENSIVE METABOLIC PANEL
ALT: 53 U/L — ABNORMAL HIGH (ref 0–44)
AST: 44 U/L — ABNORMAL HIGH (ref 15–41)
Albumin: 4.2 g/dL (ref 3.5–5.0)
Alkaline Phosphatase: 109 U/L (ref 38–126)
Anion gap: 8 (ref 5–15)
BUN: 12 mg/dL (ref 6–20)
CO2: 25 mmol/L (ref 22–32)
Calcium: 9 mg/dL (ref 8.9–10.3)
Chloride: 104 mmol/L (ref 98–111)
Creatinine, Ser: 0.98 mg/dL (ref 0.61–1.24)
GFR, Estimated: >60 mL/min (ref >60)
Glucose, Bld: 99 mg/dL (ref 70–99)
Potassium: 3.8 mmol/L (ref 3.5–5.1)
Sodium: 137 mmol/L (ref 135–145)
Total Bilirubin: 0.9 mg/dL (ref 0.3–1.2)
Total Protein: 7.6 g/dL (ref 6.5–8.1)

## 2023-02-17 LAB — LIPID PANEL
Cholesterol: 135 mg/dL (ref 0–200)
HDL: 57 mg/dL (ref >40)
LDL Cholesterol: 65 mg/dL (ref 0–99)
Total CHOL/HDL Ratio: 2.4 RATIO
Triglycerides: 65 mg/dL (ref <150)
VLDL: 13 mg/dL (ref 0–40)

## 2023-02-17 MED ORDER — METOPROLOL SUCCINATE ER 100 MG PO TB24
100.0000 mg | ORAL_TABLET | Freq: Every day | ORAL | 3 refills | Status: DC
  Filled 2023-02-17: qty 30, 30d supply, fill #0

## 2023-02-17 MED ORDER — LOSARTAN POTASSIUM 25 MG PO TABS
ORAL_TABLET | ORAL | 3 refills | Status: DC
  Filled 2023-02-17: qty 30, 33d supply, fill #0
  Filled 2023-03-23: qty 30, 30d supply, fill #1
  Filled 2023-05-25: qty 30, 30d supply, fill #2
  Filled 2023-07-14 (×2): qty 30, 30d supply, fill #3
  Filled 2023-08-30: qty 30, 30d supply, fill #4
  Filled 2023-10-07: qty 30, 30d supply, fill #5
  Filled 2023-11-18: qty 30, 30d supply, fill #6

## 2023-02-17 NOTE — Assessment & Plan Note (Signed)
Significant PVCs noted on EKG but also on rhythm strip.  They appear to be monofocal and therefore probably likely from scar tissue.  Would like to see what his burden is-7-day Zio patch monitor.  We will do 3 on current dose of Toprol and then increase to 100 mg for the last 4 days.  If he continues to have a pretty significant high burden we may need to consider referral for PVC ablation or antiarrhythmic therapy.

## 2023-02-17 NOTE — Assessment & Plan Note (Signed)
NYHA class I symptoms.  No PND, orthopnea or edema.  Euvolemic on exam. Currently on Toprol 50 mg, will add losartan 25 mg gradually. No diuretic at this time, would consider spironolactone if BP would tolerate follow-up.

## 2023-02-17 NOTE — Assessment & Plan Note (Signed)
Essentially 6 months out from his MI.  Doing well.  Did not have as much of a bump in his EF on echo as it would have hoped, but not unexpectedly given the size of his infarct.  Somewhat concerning PVC burden, will increase beta-blocker dose and check monitor.

## 2023-02-17 NOTE — Patient Instructions (Signed)
Medication Instructions:  Your physician recommends the following medication changes.   START TAKING: Losartan 0.5 tablets (12.5 mg) daily times one week then increase Losartan 1 tablet (25 mg) daily.    INCREASE: Starting on the fourth day of wearing your heart monitor increase your Metoprolol Succinate to 100 mg daily from 50 mg daily.   *If you need a refill on your cardiac medications before your next appointment, please call your pharmacy*   Lab Work: Your provider would like for you to have following labs drawn: Lipid panel and CMP.   Please go to the [facility] entrance and check in at the front desk.  You do not need an appointment.  They are open from 7am-6 pm.   If you have labs (blood work) drawn today and your tests are completely normal, you will receive your results only by: MyChart Message (if you have MyChart) OR A paper copy in the mail If you have any lab test that is abnormal or we need to change your treatment, we will call you to review the results.   Testing/Procedures: Shane [name]- Long Term Monitor Instructions  Your physician has requested you wear a ZIO patch monitor for 7 days.  This is a single patch monitor. [employer] supplies one patch monitor per enrollment. Additional stickers are not available. Please do not apply patch if you will be having a Nuclear Stress Test,  Echocardiogram, Cardiac CT, MRI, or Chest Xray during the period you would be wearing the  monitor. The patch cannot be worn during these tests. You cannot remove and re-apply the  ZIO XT patch monitor.  Your ZIO patch monitor will be mailed 3 day USPS to your address on file. It may take 3-5 days  to receive your monitor after you have been enrolled.  Once you have received your monitor, please review the enclosed instructions. Your monitor  has already been registered assigning a specific monitor serial # to you.  Billing and Patient Assistance Program Information  We have  supplied [employer] with any of your insurance information on file for billing purposes. [employer] offers a sliding scale Patient Assistance Program for patients that do not have  insurance, or whose insurance does not completely cover the cost of the ZIO monitor.  You must apply for the Patient Assistance Program to qualify for this discounted rate.  To apply, please call [employer] at [phone], select option 4, select option 2, ask to apply for  Patient Assistance Program. Shane [name] will ask your household income, and how many people  are in your household. They will quote your out-of-pocket cost based on that information.  [employer] will also be able to set up a 31-month, interest-free payment plan if needed.  Applying the monitor   Shave hair from upper left chest.  Hold abrader disc by orange tab. Rub abrader in 40 strokes over the upper left chest as  indicated in your monitor instructions.  Clean area with 4 enclosed alcohol pads. Let dry.  Apply patch as indicated in monitor instructions. Patch will be placed under collarbone on left  side of chest with arrow pointing upward.  Rub patch adhesive wings for 2 minutes. Remove white label marked "1". Remove the white  label marked "2". Rub patch adhesive wings for 2 additional minutes.  While looking in a mirror, press and release button in center of patch. A small green light will  flash 3-4 times. This will be your only indicator that the monitor has been  turned on.  Do not shower for the first 24 hours. You may shower after the first 24 hours.  Press the button if you feel a symptom. You will hear a small click. Record Date, Time and  Symptom in the Patient Logbook.  When you are ready to remove the patch, follow instructions on the last 2 pages of Patient  Logbook. Stick patch monitor onto the last page of Patient Logbook.  Place Patient Logbook in the blue and white box. Use locking tab on box and tape box closed  securely. The blue and  white box has prepaid postage on it. Please place it in the mailbox as  soon as possible. Your physician should have your test results approximately 7 days after the  monitor has been mailed back to [facility].  Call [facility] Customer Care at [phone] if you have questions regarding  your ZIO XT patch monitor. Call them immediately if you see an orange light blinking on your  monitor.  If your monitor falls off in less than 4 days, contact our Monitor department at [phone].  If your monitor becomes loose or falls off after 4 days call [employer] at [phone] for  suggestions on securing your monitor    Follow-Up: At [facility], you and your health needs are our priority.  As part of our continuing mission to provide you with exceptional heart care, we have created designated Provider Care Teams.  These Care Teams include your primary Cardiologist (physician) and Advanced Practice Providers (APPs -  Physician Assistants and Nurse Practitioners) who all work together to provide you with the care you need, when you need it.  We recommend signing up for the patient portal called "MyChart".  Sign up information is provided on this After Visit Summary.  MyChart is used to connect with patients for Virtual Visits (Telemedicine).  Patients are able to view lab/test results, encounter notes, upcoming appointments, etc.  Non-urgent messages can be sent to your provider as well.   To learn more about what you can do with MyChart, go to [email or web address].    Your next appointment:    Follow up with APP in 1-2 months Follow up with Dr. Herbie Baltimore in November.  Provider:   You may see Shane Lemma, MD or one of the following Advanced Practice Providers on your designated Care Team:   Nicolasa Ducking, NP Eula Listen, PA-C Cadence Fransico [place], PA-C Charlsie Quest, NP

## 2023-02-17 NOTE — Assessment & Plan Note (Signed)
Really only single-vessel disease now with extensive PCI to the RCA.  There was extensive thrombectomy and some distal embolization, but seems to doing well without any active anginal symptoms.  Plan: Currently on Toprol 50 mg -> will titrate up to 100 mg. Currently on Crestor 40 mg-due for labs to be checked. On Farxiga 10 mg Adding losartan 25 mg which will titrate on 12.5 mg and increase to 25 X 1 week. On aspirin and Brilinta.

## 2023-02-17 NOTE — Assessment & Plan Note (Signed)
[place] labs were not at goal.  This was just shortly after his hospitalization, was converted to Crestor.  Is due for labs to be checked.  Will recheck labs today and adjust accordingly.  Low threshold to consider titration

## 2023-02-17 NOTE — Progress Notes (Signed)
Primary Care Provider: Allegra Grana, FNP [facility] Cardiologist: Bryan Lemma, MD Electrophysiologist: None  Clinic Note: Chief Complaint  Patient presents with   3 month follow up     Patient c/o irregular heart beats "fluttering" while lying on the couch once about 2 months ago. Medications reviewed by the patient verbally.    Coronary Artery Disease    No chest pain or pressure.    ===================================  ASSESSMENT/PLAN   Problem List Items Addressed This Visit       Cardiology Problems   Ischemic cardiomyopathy (Chronic)    Not very significant increase in EF post PCI.  We were not able to get him on good guideline directed therapy, and had a pretty prolonged occlusion time with extensive occlusion of the RCA.  No heart failure symptoms, but significant PVCs.  Will titrate up Toprol to 100 mg and add losartan 25 mg daily.      Relevant Medications   losartan (COZAAR) 25 MG tablet   metoprolol succinate (TOPROL XL) 100 MG 24 hr tablet   Other Relevant Orders   EKG 12-Lead   Hyperlipidemia LDL goal <70 (Chronic)    Vascello labs were not at goal.  This was just shortly after his hospitalization, was converted to Crestor.  Is due for labs to be checked.  Will recheck labs today and adjust accordingly.  Low threshold to consider titration      Relevant Medications   losartan (COZAAR) 25 MG tablet   metoprolol succinate (TOPROL XL) 100 MG 24 hr tablet   Other Relevant Orders   EKG 12-Lead   Frequent PVCs (Chronic)    Significant PVCs noted on EKG but also on rhythm strip.  They appear to be monofocal and therefore probably likely from scar tissue.  Would like to see what his burden is-7-day Zio patch monitor.  We will do 3 on current dose of Toprol and then increase to 100 mg for the last 4 days.  If he continues to have a pretty significant high burden we may need to consider referral for PVC ablation or antiarrhythmic therapy.       Relevant Medications   losartan (COZAAR) 25 MG tablet   metoprolol succinate (TOPROL XL) 100 MG 24 hr tablet   Other Relevant Orders   EKG 12-Lead   LONG TERM MONITOR (3-14 DAYS)   Essential hypertension (Chronic)    Not really hypertensive.  Is on 50 mg Toprol which I will increase to 100 mg and add low-dose losartan.      Relevant Medications   losartan (COZAAR) 25 MG tablet   metoprolol succinate (TOPROL XL) 100 MG 24 hr tablet   Coronary artery disease involving native coronary artery without angina pectoris - Primary (Chronic)    Really only single-vessel disease now with extensive PCI to the RCA.  There was extensive thrombectomy and some distal embolization, but seems to doing well without any active anginal symptoms.  Plan: Currently on Toprol 50 mg -> will titrate up to 100 mg. Currently on Crestor 40 mg-due for labs to be checked. On Farxiga 10 mg Adding losartan 25 mg which will titrate on 12.5 mg and increase to 25 X 1 week. On aspirin and Brilinta.      Relevant Medications   losartan (COZAAR) 25 MG tablet   metoprolol succinate (TOPROL XL) 100 MG 24 hr tablet   Other Relevant Orders   EKG 12-Lead   Chronic combined systolic and diastolic heart failure (HCC) (  Chronic)    NYHA class I symptoms.  No PND, orthopnea or edema.  Euvolemic on exam. Currently on Toprol 50 mg, will add losartan 25 mg gradually. No diuretic at this time, would consider spironolactone if BP would tolerate follow-up.       Relevant Medications   losartan (COZAAR) 25 MG tablet   metoprolol succinate (TOPROL XL) 100 MG 24 hr tablet   Other Relevant Orders   EKG 12-Lead   LONG TERM MONITOR (3-14 DAYS)   Acute ST elevation myocardial infarction (STEMI) of inferolateral wall (HCC) (Chronic)    Essentially 6 months out from his MI.  Doing well.  Did not have as much of a bump in his EF on echo as it would have hoped, but not unexpectedly given the size of his infarct.  Somewhat concerning  PVC burden, will increase beta-blocker dose and check monitor.      Relevant Medications   losartan (COZAAR) 25 MG tablet   metoprolol succinate (TOPROL XL) 100 MG 24 hr tablet   Other Relevant Orders   EKG 12-Lead   LONG TERM MONITOR (3-14 DAYS)     Other   Presence of drug coated stent in right coronary artery (Chronic)    Extensive PCI to the RCA, probably had a long standing Thienopyridine coverage with either Brilinta or Plavix.  For now plan will be to continue DAPT throughout the first year and then switch to maintenance dose Brilinta 60 mg twice daily.  At that point, would be simple to hold aspirin potential procedures or surgeries.  As of now, only if he has significant bruising but this is not acceptable to hold aspirin.  For emergency surgeries would like to bridge with cangrelor in order to avoid unnecessary gaps in therapy.      Other Visit Diagnoses     Medication management       Relevant Orders   Comprehensive metabolic panel (Completed)   Lipid panel (Completed)       ===================================  HPI:    Shane [name] is a 52 y.o. male smoker with ADD, anxiety and HLD as well as recent inferior STEMI in November 2023 (RCA PCI) below who presents today for 49-month follow-up.  He returns today at the request of Allegra Grana, FNP.  Shane [name] was last seen on 11/17/22 by Dr. Kirke Corin (I was called for Emergency STEMI) -> had not been started on ACE inhibitor/ARB due to low blood pressure.  He quit smoking.  Doing well with no chest pain dyspnea etc.  Normal exam.  Converted to rosuvastatin 40 mg daily with plans to recheck lipids.  Recommended follow-up echocardiogram in 1 month.  Recent Hospitalizations: None  Reviewed  CV studies:    The following studies were reviewed today: (if available, images/films reviewed: From Epic Chart or Care Everywhere) Cardiac Cath-PCI 09/02/22: Acute Inferolateral STEMI-suspect possible RV involvement  with mildly reduced EF and basal to mid inferior akinesis to severe hypokinesis.  EF estimated roughly 45% CULPRIT Lesion is 100% near still RCA heavily thrombotic occlusion with extensive thrombus throughout the entire RCA requiring Penumbra thrombectomy to both the proximal and distal portion of the vessel finally restoring TIMI-3 flow to all but the very apical portions of the RPL and PDA. =>  The proximal to mid will disease vessel had received treated with 2 overlapping Onyx Frontier DES Stents (3.5 mm 38 mm and 3.5 mm x 30 mm with roughly 2 to 3 mm overlap.  Stents were post-dilated  to 3.65 mm) => TIMI 0 flow reduced to TIMI-3 flow with 0% residual stenosis   Angiographically normal Left Coronary System with a non--dominant Cx and a Large Ramus Intermedius that is the same size as it is a wraparound LAD.      09/2022 Echo: EF 35-40%, glob HK, GrI DD, mildly reduced RV fxn, RVSP 31.49mmHg, no significant valvular disease. TTE follow-up 01/06/2023: EF remains 40 to 45% with mild to moderate dysfunction.  Global HK.  GR 1 DD.  Otherwise normal valves.  Elevated RAP.  Interval History:   Shane [name] returns here today overall doing fairly well from a cardiac standpoint.  He does note that he is stamina is not quite where it used to be and that he has to govern himself when he is outside doing yard work or or trying exercise.  He says when he starts feeling a little tired or short of breath, he will stop to go inside and get some water and catch his breath before he goes back out and does things.  Very careful to avoid being dehydrated or overdoing it.  He denies any chest pain or pressure when he is exerting himself and he does exert relatively vigorously.  He denies any PND orthopnea edema.   He said he has had 1 or 2 episodes shortly after his MI where he felt a little fluttering flipping sensation in his chest but nothing prolonged nothing to suggest an arrhythmia.  No associated  lightheadedness dizziness or wooziness.  CV Review of Symptoms (Summary): no chest pain or dyspnea on exertion positive for - occasional every now and then palpitations.  Exertional dyspnea if he overdoes it. negative for - edema, irregular heartbeat, orthopnea, palpitations, paroxysmal nocturnal dyspnea, rapid heart rate, shortness of breath, or lightheadedness, dizziness or wooziness, syncope/near syncope or TIA/amaurosis fugax claudication  REVIEWED OF SYSTEMS   Review of Systems  Constitutional:  Negative for malaise/fatigue (Just not quite as much stamina and used to have) and weight loss.  HENT:  Negative for nosebleeds.   Respiratory:  Negative for cough and shortness of breath.   Gastrointestinal:  Negative for blood in stool, constipation and melena.  Genitourinary:  Negative for frequency and hematuria.  Musculoskeletal:  Negative for joint pain.  Neurological:  Negative for dizziness, focal weakness and weakness.  Psychiatric/Behavioral:  Negative for depression and memory loss. The patient is not nervous/anxious and does not have insomnia.   All other systems reviewed and are negative.  I have reviewed and (if needed) personally updated the patient's problem list, medications, allergies, past medical and surgical history, social and family history.   PAST MEDICAL HISTORY   Past Medical History:  Diagnosis Date   Acute ST elevation myocardial infarction (STEMI) of inferolateral wall (HCC) 09/02/2022   Extensive thrombus in the RCA-thrombectomy with overlapping 3.5 mm stents (38 and 30 mm)   ADD (attention deficit disorder)    Alcohol abuse    History of.   Anxiety    Chronic HFrEF (heart failure with reduced ejection fraction) (HCC)    a. 09/2022 Echo: EF 35-40%, glob HK, GrI DD, mildly reduced RV fxn, RVSP 31.56mmHg, no significant valvular disease.   Coronary artery disease involving native coronary artery of native heart 08/2022   a. 08/2022 Inflat STEMI/PCI: LM nl, LAD  nl, D1/2 nl, RI nl, LCX nl, OM1/2 nl, RCA 100p/m (thrombectomy & 3.5x38 & 3.5x30 overlapping Onyx Frontier DESs), 34m/d (thombectomy and PTCA into RPAV branch), RPDA 100, RPL1 100, EF  45-50%.   Depression    Frequent PVCs 02/17/2023   Hyperlipidemia    Hypertension    Ischemic cardiomyopathy    a. 09/2022 Echo: EF 35-40%.   Ischemic cardiomyopathy 02/17/2023   09/2022 (inferior STEMI) echo: EF 35-40%, glob HK, GrI DD, mildly reduced RV fxn, RVSP 31.10mmHg, no significant valvular disease.  TTE follow-up 01/06/2023: EF remains 40 to 45% with mild to moderate dysfunction       PAST SURGICAL HISTORY   Past Surgical History:  Procedure Laterality Date   CORONARY/GRAFT ACUTE MI REVASCULARIZATION N/A 09/02/2022   Procedure: Coronary/Graft Acute MI Revascularization;  Surgeon: Marykay Lex, MD;  Location: [facility] INVASIVE CV LAB;  Service: Cardiovascular;  Laterality: N/A;   LEFT HEART CATH AND CORS/GRAFTS ANGIOGRAPHY N/A 09/02/2022   Procedure: LEFT HEART CATH AND CORS/GRAFTS ANGIOGRAPHY;  Surgeon: Marykay Lex, MD;  Location: [facility] INVASIVE CV LAB;  Service: Cardiovascular;  Laterality: N/A;   TENDON REPAIR Right 09/15/2015   Procedure: RIGHT FOREARM EXPLORATION AND CLOSURE OF LACERATION, TENDON REPAIR;  Surgeon: Bradly Bienenstock, MD;  Location: [facility] OR;  Service: Orthopedics;  Laterality: Right;    Immunization History  Administered Date(s) Administered   Hepatitis B 12/05/2011, 06/02/2012   Influenza Split 03/08/2012   MMR 03/29/2012   Tdap 12/05/2011, 03/05/2020    MEDICATIONS/ALLERGIES   Current Meds  Medication Sig   amphetamine-dextroamphetamine (ADDERALL) 20 MG tablet Take 1 tablet (20 mg total) by mouth 2 (two) times daily.   aspirin 81 MG chewable tablet Chew 1 tablet (81 mg total) by mouth daily.   dapagliflozin propanediol (FARXIGA) 10 MG TABS tablet Take 1 tablet (10 mg total) by mouth daily.   losartan (COZAAR) 25 MG tablet Take 0.5 tablets (12.5 mg total) by mouth daily for  7 days, THEN 1 tablet (25 mg total) daily.   metoprolol succinate (TOPROL XL) 100 MG 24 hr tablet Take 1 tablet (100 mg total) by mouth daily. Take with or immediately following a meal.   mirtazapine (REMERON) 15 MG tablet TAKE 1 TABLET BY MOUTH AT BEDTIME.   nicotine (NICODERM CQ - DOSED IN MG/24 HOURS) 14 mg/24hr patch Place 1 patch (14 mg total) onto the skin daily.   rosuvastatin (CRESTOR) 40 MG tablet Take 1 tablet (40 mg total) by mouth daily.   sertraline (ZOLOFT) 100 MG tablet TAKE 1 TABLET BY MOUTH DAILY   ticagrelor (BRILINTA) 90 MG TABS tablet Take 1 tablet (90 mg total) by mouth 2 (two) times daily.   [DISCONTINUED] metoprolol succinate (TOPROL-XL) 50 MG 24 hr tablet Take 1 tablet (50 mg total) by mouth daily. Take with or immediately following a meal.    No Known Allergies  SOCIAL HISTORY/FAMILY HISTORY   Reviewed in Epic:  Pertinent findings:  Social History   Tobacco Use   Smoking status: Some Days    Packs/day: 0.25    Years: 20.00    Additional pack years: 0.00    Total pack years: 5.00    Types: Cigarettes    Last attempt to quit: 09/02/2022    Years since quitting: 0.4   Smokeless tobacco: Never   Tobacco comments:    1/2 PPW; 02/17/2023  Vaping Use   Vaping Use: Never used  Substance Use Topics   Alcohol use: No    Comment: no alcohol since 12/16/2012   Drug use: No   Social History   Social History Narrative   Divorced in 08/2020; they have  two daughters (22 and 64 )    In  the past-Rebuilds induction coils for work   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  OBJCTIVE -PE, EKG, labs   Wt Readings from Last 3 Encounters:  02/17/23 199 lb 2 oz (90.3 kg)  11/17/22 207 lb (93.9 kg)  09/16/22 203 lb 2 oz (92.1 kg)    Physical Exam: Physical Exam Vitals reviewed.  Constitutional:      General: He is not in acute distress.    Appearance: Normal appearance. He is normal weight. He is not ill-appearing or toxic-appearing.   HENT:     Head: Normocephalic and atraumatic.  Neck:     Vascular: No carotid bruit.  Cardiovascular:     Rate and Rhythm: Normal rate and regular rhythm.     Pulses: Normal pulses.     Heart sounds: Normal heart sounds. No murmur heard.    No friction rub. No gallop.  Pulmonary:     Effort: Pulmonary effort is normal. No respiratory distress.     Breath sounds: Normal breath sounds. No wheezing, rhonchi or rales.  Chest:     Chest wall: No tenderness.  Musculoskeletal:        General: No swelling. Normal range of motion.     Cervical back: Normal range of motion and neck supple.  Skin:    General: Skin is warm and dry.  Neurological:     General: No focal deficit present.     Mental Status: He is alert and oriented to person, place, and time.     Gait: Gait normal.  Psychiatric:        Mood and Affect: Mood normal.        Behavior: Behavior normal.        Thought Content: Thought content normal.        Judgment: Judgment normal.     Adult ECG Report  Rate: 82 ;  Rhythm: normal sinus rhythm, premature ventricular contractions (PVC), and PVCs are noted to be in couplets and triplets on the rhythm strip.  Evolutionary changes of inferior STEMI still present with mild Q waves and deep T wave inversions in inferior leads. ;   Narrative Interpretation: Stable but with significant PVCs  Recent Labs: Reviewed Lab Results  Component Value Date   CHOL 172 10/25/2022   HDL 55.60 10/25/2022   LDLCALC 101 (H) 10/25/2022   TRIG 77.0 10/25/2022   CHOLHDL 3 10/25/2022   Lab Results  Component Value Date   CREATININE 0.98 09/13/2022   BUN 15 09/13/2022   NA 137 09/13/2022   K 4.6 09/13/2022   CL 100 09/13/2022   CO2 27 09/13/2022      Latest Ref Rng & Units 09/03/2022    4:39 AM 09/02/2022    9:56 PM 09/02/2022    7:36 PM  CBC  WBC 4.0 - 10.5 K/uL 9.2  9.8  12.5   Hemoglobin 13.0 - 17.0 g/dL 16.1  09.6  04.5   Hematocrit 39.0 - 52.0 % 46.2  45.5  49.4   Platelets 150 -  400 K/uL 279  262  290     Lab Results  Component Value Date   HGBA1C 5.8 (A) 07/05/2022   Lab Results  Component Value Date   TSH 2.41 10/02/2021    ================================================== I spent a total of 24 minutes with the patient spent in direct patient consultation.  Additional time spent with chart review  / charting (studies, outside notes, etc): 32 min Total Time: 56 min  Current medicines are  reviewed at length with the patient today.  (+/- concerns) none  Notice: This dictation was prepared with Dragon dictation along with smart phrase technology. Any transcriptional errors that result from this process are unintentional and may not be corrected upon review.  Studies Ordered:  Orders Placed This Encounter  Procedures   Comprehensive metabolic panel   Lipid panel   LONG TERM MONITOR (3-14 DAYS)   EKG 12-Lead   Meds ordered this encounter  Medications   losartan (COZAAR) 25 MG tablet    Sig: Take 0.5 tablets (12.5 mg total) by mouth daily for 7 days, THEN 1 tablet (25 mg total) daily.    Dispense:  90 tablet    Refill:  3   metoprolol succinate (TOPROL XL) 100 MG 24 hr tablet    Sig: Take 1 tablet (100 mg total) by mouth daily. Take with or immediately following a meal.    Dispense:  90 tablet    Refill:  3    Patient Instructions / Medication Changes & Studies & Tests Ordered   Patient Instructions  Medication Instructions:  Your physician recommends the following medication changes.   START TAKING: Losartan 0.5 tablets (12.5 mg) daily times one week then increase Losartan 1 tablet (25 mg) daily.    INCREASE: Starting on the fourth day of wearing your heart monitor increase your Metoprolol Succinate to 100 mg daily from 50 mg daily.   *If you need a refill on your cardiac medications before your next appointment, please call your pharmacy*   Lab Work: Your provider would like for you to have following labs drawn: Lipid panel and CMP.    Please go to the [facility] entrance and check in at the front desk.  You do not need an appointment.  They are open from 7am-6 pm.   If you have labs (blood work) drawn today and your tests are completely normal, you will receive your results only by: MyChart Message (if you have MyChart) OR A paper copy in the mail If you have any lab test that is abnormal or we need to change your treatment, we will call you to review the results.   Testing/Procedures: Christena Deem- Long Term Monitor Instructions  Your physician has requested you wear a ZIO patch monitor for 7 days.  This is a single patch monitor. [employer] supplies one patch monitor per enrollment. Additional stickers are not available. Please do not apply patch if you will be having a Nuclear Stress Test,  Echocardiogram, Cardiac CT, MRI, or Chest Xray during the period you would be wearing the  monitor. The patch cannot be worn during these tests. You cannot remove and re-apply the  ZIO XT patch monitor.  Your ZIO patch monitor will be mailed 3 day USPS to your address on file. It may take 3-5 days  to receive your monitor after you have been enrolled.  Once you have received your monitor, please review the enclosed instructions. Your monitor  has already been registered assigning a specific monitor serial # to you.  Billing and Patient Assistance Program Information  We have supplied [employer] with any of your insurance information on file for billing purposes. [employer] offers a sliding scale Patient Assistance Program for patients that do not have  insurance, or whose insurance does not completely cover the cost of the ZIO monitor.  You must apply for the Patient Assistance Program to qualify for this discounted rate.  To apply, please call [employer] at [phone], select option  4, select option 2, ask to apply for  Patient Assistance Program. [name] will ask your household income, and how many people  are in your household.  They will quote your out-of-pocket cost based on that information.  [employer] will also be able to set up a 59-month, interest-free payment plan if needed.  Applying the monitor   Shave hair from upper left chest.  Hold abrader disc by orange tab. Rub abrader in 40 strokes over the upper left chest as  indicated in your monitor instructions.  Clean area with 4 enclosed alcohol pads. Let dry.  Apply patch as indicated in monitor instructions. Patch will be placed under collarbone on left  side of chest with arrow pointing upward.  Rub patch adhesive wings for 2 minutes. Remove white label marked "1". Remove the white  label marked "2". Rub patch adhesive wings for 2 additional minutes.  While looking in a mirror, press and release button in center of patch. A small green light will  flash 3-4 times. This will be your only indicator that the monitor has been turned on.  Do not shower for the first 24 hours. You may shower after the first 24 hours.  Press the button if you feel a symptom. You will hear a small click. Record Date, Time and  Symptom in the Patient Logbook.  When you are ready to remove the patch, follow instructions on the last 2 pages of Patient  Logbook. Stick patch monitor onto the last page of Patient Logbook.  Place Patient Logbook in the blue and white box. Use locking tab on box and tape box closed  securely. The blue and white box has prepaid postage on it. Please place it in the mailbox as  soon as possible. Your physician should have your test results approximately 7 days after the  monitor has been mailed back to [facility].  Call [facility] Customer Care at [phone] if you have questions regarding  your ZIO XT patch monitor. Call them immediately if you see an orange light blinking on your  monitor.  If your monitor falls off in less than 4 days, contact our Monitor department at [phone].  If your monitor becomes loose or falls off after 4 days call  [employer] at [phone] for  suggestions on securing your monitor    Follow-Up: At [facility], you and your health needs are our priority.  As part of our continuing mission to provide you with exceptional heart care, we have created designated Provider Care Teams.  These Care Teams include your primary Cardiologist (physician) and Advanced Practice Providers (APPs -  Physician Assistants and Nurse Practitioners) who all work together to provide you with the care you need, when you need it.  We recommend signing up for the patient portal called "MyChart".  Sign up information is provided on this After Visit Summary.  MyChart is used to connect with patients for Virtual Visits (Telemedicine).  Patients are able to view lab/test results, encounter notes, upcoming appointments, etc.  Non-urgent messages can be sent to your provider as well.   To learn more about what you can do with MyChart, go to [email or web address].    Your next appointment:    Follow up with APP in 1-2 months Follow up with Dr. Herbie Baltimore in November.  Provider:   You may see Bryan Lemma, MD or one of the following Advanced Practice Providers on your designated Care Team:   Nicolasa Ducking, NP Eula Listen, PA-C Cadence Fransico [place], [place]  Charlsie Quest, NP           Marykay Lex, MD, MS Bryan Lemma, M.D., M.S. Interventional Cardiologist  [facility]   [address] [place], [place]  [address] [phone]           Fax [phone]    Thank you for choosing [facility] in [place]!!

## 2023-02-17 NOTE — Assessment & Plan Note (Signed)
Extensive PCI to the RCA, probably had a long standing Thienopyridine coverage with either Brilinta or Plavix.  For now plan will be to continue DAPT throughout the first year and then switch to maintenance dose Brilinta 60 mg twice daily.  At that point, would be simple to hold aspirin potential procedures or surgeries.  As of now, only if he has significant bruising but this is not acceptable to hold aspirin.  For emergency surgeries would like to bridge with cangrelor in order to avoid unnecessary gaps in therapy.

## 2023-02-17 NOTE — Assessment & Plan Note (Signed)
Not very significant increase in EF post PCI.  We were not able to get him on good guideline directed therapy, and had a pretty prolonged occlusion time with extensive occlusion of the RCA.  No heart failure symptoms, but significant PVCs.  Will titrate up Toprol to 100 mg and add losartan 25 mg daily.

## 2023-02-17 NOTE — Assessment & Plan Note (Signed)
Not really hypertensive.  Is on 50 mg Toprol which I will increase to 100 mg and add low-dose losartan.

## 2023-02-24 ENCOUNTER — Other Ambulatory Visit: Payer: Self-pay

## 2023-02-24 DIAGNOSIS — I5042 Chronic combined systolic (congestive) and diastolic (congestive) heart failure: Secondary | ICD-10-CM

## 2023-02-24 DIAGNOSIS — I2119 ST elevation (STEMI) myocardial infarction involving other coronary artery of inferior wall: Secondary | ICD-10-CM

## 2023-02-24 DIAGNOSIS — I493 Ventricular premature depolarization: Secondary | ICD-10-CM | POA: Diagnosis not present

## 2023-02-24 MED ORDER — VITAMIN D (ERGOCALCIFEROL) 50000 UNITS PO CAPS
1.0000 | ORAL_CAPSULE | ORAL | 1 refills | Status: DC
  Filled 2023-02-24: qty 4, 28d supply, fill #0

## 2023-03-03 ENCOUNTER — Other Ambulatory Visit: Payer: Self-pay | Admitting: Family

## 2023-03-03 ENCOUNTER — Other Ambulatory Visit: Payer: Self-pay

## 2023-03-03 DIAGNOSIS — F900 Attention-deficit hyperactivity disorder, predominantly inattentive type: Secondary | ICD-10-CM

## 2023-03-04 ENCOUNTER — Other Ambulatory Visit: Payer: Self-pay

## 2023-03-04 NOTE — Telephone Encounter (Signed)
Spoke with pt and scheduled him for 03/22/2023.

## 2023-03-07 ENCOUNTER — Other Ambulatory Visit: Payer: Self-pay

## 2023-03-07 ENCOUNTER — Other Ambulatory Visit: Payer: Self-pay | Admitting: Family

## 2023-03-07 DIAGNOSIS — F900 Attention-deficit hyperactivity disorder, predominantly inattentive type: Secondary | ICD-10-CM

## 2023-03-07 MED ORDER — AMPHETAMINE-DEXTROAMPHETAMINE 20 MG PO TABS
20.0000 mg | ORAL_TABLET | Freq: Two times a day (BID) | ORAL | 0 refills | Status: DC
Start: 2023-03-07 — End: 2023-03-22
  Filled 2023-03-07: qty 60, 30d supply, fill #0

## 2023-03-21 NOTE — Progress Notes (Unsigned)
Cardiology Office Note    Date:  03/22/2023   ID:  Shane [name], DOB 1971/06/11, MRN [id number]  PCP:  Allegra Grana, FNP  Cardiologist:  Bryan Lemma, MD  Electrophysiologist:  None   Chief Complaint: Follow up  History of Present Illness:   Shane [name] is a 52 y.o. male with history of CAD with inferior ST elevation MI in 08/2022 status post thrombectomy with PCI and 2 overlapping DES, HFrEF secondary to ICM, frequent PVCs, HTN, HLD, ADD, anxiety, and prior tobacco use who presents for follow-up of his CAD, ICM, and frequent PVCs.  He was admitted in 08/2022 with inferior ST elevation MI with suspected RV infarct.  LHC showed thrombotic occlusion of the RCA with large thrombus burden.  This was treated successfully with aspiration thrombectomy and 2 overlapping drug-eluting stents.  There was no other obstructive disease.  Echo at that time showed an EF of 35 to 40%, global hypokinesis, grade 1 diastolic dysfunction, mildly reduced RV systolic function with normal ventricular cavity size, normal PASP, aortic sclerosis without evidence of stenosis.  Relative hypotension has precluded escalation of GDMT.  Most recent echo from 01/2023 demonstrated an EF of 40 to 45%, global hypokinesis, mildly dilated internal LV cavity size, grade 1 diastolic dysfunction, normal RV systolic function and ventricular cavity size, and mild mitral regurgitation.  He was most recently seen in the office in 02/2023 for follow-up of his echo and was without symptoms of angina or cardiac decompensation.  He did feel like his stamina was not where he used to be.  He also reported a couple episodes of palpitations.  EKG at that time was notable for sinus rhythm with frequent PVCs.  Toprol was titrated to 100 mg daily (on day 4 of Zio patch) and losartan 25 mg daily was added.  Subsequent Zio patch showed a predominant rhythm of sinus with an average rate of 92 bpm (range 54 to 231 bpm), 2 episodes of NSVT  occurred with the fastest interval lasting 4 beats with a maximum rate of 184 bpm and the longest interval lasting 8 beats with an average rate of 140 bpm, 1 run of SVT occurred lasting 15 beats with a maximum rate of 231 bpm (average 209 bpm), rare PACs and atrial couplets, frequent PVCs with an overall burden of 20.9%, and rare ventricular couplets/triplets.  Ventricular bigeminy and trigeminy were present.  Upon reviewing ventricular ectopy burden, after he titrated from Toprol-XL to 100 mg daily on day 4 of his Zio patch, his ectopy burden was significantly improved, decreasing by approximately 50%.  He comes in doing well from a cardiac perspective and is without symptoms of angina or cardiac decompensation.  Following addition of losartan and titration of Toprol, he did notice some positional dizziness that has subsequently improved.  He also feels like his palpitation burden has improved somewhat as well.  No falls or symptoms concerning for bleeding.  Remains adherent to cardiac medications, including DAPT.  His weight is down approximately 3 pounds when compared to his visit in 02/2023.  No presyncope or syncope.   Labs independently reviewed: 02/2023 - potassium 3.8, BUN 12, serum creatinine 0.98, albumin 4.2, AST 44, ALT 53, TC 135, TG 65, HDL 57, LDL 65 09/2022 - LP(a) 9.5, Hgb 15.0, PLT 279 07/2022 - A1c 5.8 09/2021 - TSH normal  Past Medical History:  Diagnosis Date   Acute ST elevation myocardial infarction (STEMI) of inferolateral wall (HCC) 09/02/2022   Extensive thrombus in  the RCA-thrombectomy with overlapping 3.5 mm stents (38 and 30 mm)   ADD (attention deficit disorder)    Alcohol abuse    History of.   Anxiety    Chronic HFrEF (heart failure with reduced ejection fraction) (HCC)    a. 09/2022 Echo: EF 35-40%, glob HK, GrI DD, mildly reduced RV fxn, RVSP 31.50mmHg, no significant valvular disease.   Coronary artery disease involving native coronary artery of native heart  08/2022   a. 08/2022 Inflat STEMI/PCI: LM nl, LAD nl, D1/2 nl, RI nl, LCX nl, OM1/2 nl, RCA 100p/m (thrombectomy & 3.5x38 & 3.5x30 overlapping Onyx Frontier DESs), 6m/d (thombectomy and PTCA into RPAV branch), RPDA 100, RPL1 100, EF 45-50%.   Depression    Frequent PVCs 02/17/2023   Hyperlipidemia    Hypertension    Ischemic cardiomyopathy    a. 09/2022 Echo: EF 35-40%.   Ischemic cardiomyopathy 02/17/2023   09/2022 (inferior STEMI) echo: EF 35-40%, glob HK, GrI DD, mildly reduced RV fxn, RVSP 31.70mmHg, no significant valvular disease.  TTE follow-up 01/06/2023: EF remains 40 to 45% with mild to moderate dysfunction       Past Surgical History:  Procedure Laterality Date   CORONARY/GRAFT ACUTE MI REVASCULARIZATION N/A 09/02/2022   Procedure: Coronary/Graft Acute MI Revascularization;  Surgeon: Marykay Lex, MD;  Location: [facility] INVASIVE CV LAB;  Service: Cardiovascular;  Laterality: N/A;   LEFT HEART CATH AND CORS/GRAFTS ANGIOGRAPHY N/A 09/02/2022   Procedure: LEFT HEART CATH AND CORS/GRAFTS ANGIOGRAPHY;  Surgeon: Marykay Lex, MD;  Location: [facility] INVASIVE CV LAB;  Service: Cardiovascular;  Laterality: N/A;   TENDON REPAIR Right 09/15/2015   Procedure: RIGHT FOREARM EXPLORATION AND CLOSURE OF LACERATION, TENDON REPAIR;  Surgeon: Bradly Bienenstock, MD;  Location: [facility] OR;  Service: Orthopedics;  Laterality: Right;    Current Medications: Current Meds  Medication Sig   amphetamine-dextroamphetamine (ADDERALL) 20 MG tablet Take 1 tablet (20 mg total) by mouth 2 (two) times daily.   amphetamine-dextroamphetamine (ADDERALL) 20 MG tablet Take 1 tablet (20 mg total) by mouth 2 (two) times daily.   amphetamine-dextroamphetamine (ADDERALL) 20 MG tablet Take 1 tablet (20 mg total) by mouth 2 (two) times daily.   aspirin 81 MG chewable tablet Chew 1 tablet (81 mg total) by mouth daily.   dapagliflozin propanediol (FARXIGA) 10 MG TABS tablet Take 1 tablet (10 mg total) by mouth daily.   losartan  (COZAAR) 25 MG tablet Take 0.5 tablets (12.5 mg total) by mouth daily for 7 days, THEN 1 tablet (25 mg total) daily.   mirtazapine (REMERON) 15 MG tablet Take 1 tablet (15 mg total) by mouth at bedtime.   rosuvastatin (CRESTOR) 40 MG tablet Take 1 tablet (40 mg total) by mouth daily.   sertraline (ZOLOFT) 100 MG tablet Take 1 tablet (100 mg total) by mouth daily.   ticagrelor (BRILINTA) 90 MG TABS tablet Take 1 tablet (90 mg total) by mouth 2 (two) times daily.   Vitamin D, Ergocalciferol, 50000 units CAPS Take 1 capsule by mouth once a week as directed for Vitamin D deficiency.   [DISCONTINUED] metoprolol succinate (TOPROL XL) 100 MG 24 hr tablet Take 1 tablet (100 mg total) by mouth daily. Take with or immediately following a meal.    Allergies:   Patient has no known allergies.   Social History   Socioeconomic History   Marital status: Divorced    Spouse name: Shane [name]   Number of children: 2   Years of education: 14   Highest education  level: Not on file  Occupational History    Comment: [employer]  Tobacco Use   Smoking status: Some Days    Packs/day: 0.25    Years: 20.00    Additional pack years: 0.00    Total pack years: 5.00    Types: Cigarettes    Last attempt to quit: 09/02/2022    Years since quitting: 0.5   Smokeless tobacco: Never   Tobacco comments:    1/2 PPW; 02/17/2023  Vaping Use   Vaping Use: Never used  Substance and Sexual Activity   Alcohol use: No    Comment: no alcohol since 12/16/2012   Drug use: No   Sexual activity: Yes    Partners: Female  Other Topics Concern   Not on file  Social History Narrative   Divorced in 08/2020; they have  two daughters (22 and 66 )    In the past-Rebuilds induction coils for work   05/2022- started [employer]   Social Determinants of Health   Financial Resource Strain: Not on file  Food Insecurity: Not on file  Transportation Needs: Not on file  Physical Activity: Not on file  Stress: Not  on file  Social Connections: Not on file     Family History:  The patient's family history includes Alcohol abuse in his maternal aunt and paternal grandfather; Heart attack (age of onset: 39) in his paternal uncle; Heart attack (age of onset: 79) in his maternal grandfather; Heart attack (age of onset: 40) in his maternal great-grandfather; Hyperlipidemia in his father and another family member; Hypertension in his cousin and father; Lung cancer in his maternal grandfather; Prostate cancer in his father.  ROS:   12-point review of systems is negative unless otherwise noted in the HPI.   EKGs/Labs/Other Studies Reviewed:    Studies reviewed were summarized above. The additional studies were reviewed today:  Zio patch 02/2023: Preliminary read - Patient had a min HR of 54 bpm, max HR of 231 bpm, and avg HR of 92 bpm. Predominant underlying rhythm was Sinus Rhythm. 2 Ventricular Tachycardia runs occurred, the run with the fastest interval lasting 4 beats with a max rate of 184 bpm, the longest  lasting 8 beats with an avg rate of 140 bpm. 1 run of Supraventricular Tachycardia occurred lasting 15 beats with a max rate of 231 bpm (avg 209 bpm). Isolated SVEs were rare (<1.0%), SVE Couplets were rare (<1.0%), and no SVE Triplets were present.  Isolated VEs were frequent (20.9%, [id number]), VE Couplets were rare (<1.0%, 3888), and VE Triplets were rare (<1.0%, 552). Ventricular Bigeminy and Trigeminy were present.  __________  2D echo 01/06/2023: 1. Left ventricular ejection fraction, by estimation, is 40 to 45%. The  left ventricle has mild to moderately decreased function. The left  ventricle demonstrates global hypokinesis. The left ventricular internal  cavity size was mildly dilated. Left  ventricular diastolic parameters are consistent with Grade I diastolic  dysfunction (impaired relaxation). The average left ventricular global  longitudinal strain is -13.9 %. The global longitudinal strain is   abnormal.   2. Right ventricular systolic function is normal. The right ventricular  size is normal.   3. The mitral valve is normal in structure. Mild mitral valve  regurgitation.   4. The aortic valve is tricuspid. Aortic valve regurgitation is mild.   5. The inferior vena cava is dilated in size with <50% respiratory  variability, suggesting right atrial pressure of 15 mmHg.  __________  2D echo 09/03/2022:  1. Left ventricular ejection fraction, by estimation, is 35 to 40%. The  left ventricle has moderately decreased function. The left ventricle  demonstrates global hypokinesis. Left ventricular diastolic parameters are  consistent with Grade I diastolic  dysfunction (impaired relaxation).   2. Right ventricular systolic function is mildly reduced. The right  ventricular size is normal. There is normal pulmonary artery systolic  pressure. The estimated right ventricular systolic pressure is 31.8 mmHg.   3. The mitral valve is normal in structure. No evidence of mitral valve  regurgitation. No evidence of mitral stenosis.   4. The aortic valve is normal in structure. Aortic valve regurgitation is  not visualized. Aortic valve sclerosis is present, with no evidence of  aortic valve stenosis.   5. The inferior vena cava is dilated in size with <50% respiratory  variability, suggesting right atrial pressure of 15 mmHg.  __________  LHC 09/02/2022:   Prox RCA to Mid RCA lesion is 100% stenosed.   Balloon angioplasty was performed using a BALLN TREK RX 3.0X20.   Aspiration thrombectomy performed using Penumbra catheter   A drug-eluting stent was successfully placed using a STENT ONYX FRONTIER 3.5X38.  Postdilated to 3.6 mm   A 2nd drug-eluting stent was successfully placed overlapping the previous stent proximally to cover the entire lesion, using a STENT ONYX FRONTIER 3.5X30.  Postdilated to 3.65 mm   Post intervention, there is a 0% residual stenosis for both stents.    -------------------------   Mid RCA to Dist RCA lesion is 95% stenosed with 95% stenosed side branch in RPAV.   Balloon angioplasty was performed using a BALLN TREK RX 3.0X20 -with minimal benefit.   Aspiration thrombectomy performed using Penumbra catheter from the distal RCA although into the PL branch restoring TIMI-3 flow.   Post intervention, there is a 0% residual stenosis. Post intervention, the side branch was reduced to 0% residual stenosis.   1st RPL lesion is 100% stenosed.  RPDA lesion is 100% stenosed -slow flow to the apex because of distal embolization.   -------------------------   Normal Left Coronary System   There is mild to moderate left ventricular systolic dysfunction.  The left ventricular ejection fraction is 45-50% by visual estimate -> basal to mid inferior wall severe hypo to akinesis   LV end diastolic pressure is moderately elevated.-24 mmHg   There is no aortic valve stenosis.   POST-CATH DIAGNOSES Acute Inferolateral STEMI-suspect possible RV involvement with mildly reduced EF and basal to mid inferior akinesis to severe hypokinesis.  EF estimated roughly 45% CULPRIT Lesion is 100% near still RCA heavily thrombotic occlusion with extensive thrombus throughout the entire RCA requiring Penumbra thrombectomy to both the proximal and distal portion of the vessel finally restoring TIMI-3 flow to all but the very apical portions of the RPL and PDA. =>  The proximal to mid will disease vessel had received treated with 2 overlapping Onyx Frontier DES Stents (3.5 mm 38 mm and 3.5 mm x 30 mm with roughly 2 to 3 mm overlap.  Stents were post-dilated to 3.65 mm) => TIMI 0 flow reduced to TIMI-3 flow with 0% residual stenosis   Angiographically normal Left Coronary System with a non--dominant Cx and a Large Ramus Intermedius that is the same size as it is a wraparound LAD.  Temporary hypotension not unexpected with RV infarct, responded well to 2 500 mL boluses and short run of  Levophed that was reduced and weaned off prior to leaving the Cath Lab.  Blood pressures  were in the 110/80 range.     RECOMMENDATIONS Admit to ICU-we will continue Aggrastat for 12 hours post PCI Check 2D echo in the morning-(already ordered) Cycle troponins with troponin level in the morning. We will start low-dose metoprolol 12.5 mg twice daily-titrate if possible. High-dose statin-check lipid panel in the morning. Smoke cessation counseling-he says that he will quit smoking.   EKG:  EKG is ordered today.  The EKG ordered today demonstrates NSR, 73 bpm, occasional PVCs with rhythm strip demonstrating rare ventricular couplet, prior inferior infarct with T wave inversion along leads III and aVF consistent with prior known inferior STEMI, no acute ST-T changes.  When compared to prior tracing, PVC burden appears somewhat improved  Recent Labs: 09/03/2022: Hemoglobin 15.0; Platelets 279 02/17/2023: ALT 53; BUN 12; Creatinine, Ser 0.98; Potassium 3.8; Sodium 137  Recent Lipid Panel    Component Value Date/Time   CHOL 135 02/17/2023 1140   TRIG 65 02/17/2023 1140   HDL 57 02/17/2023 1140   CHOLHDL 2.4 02/17/2023 1140   VLDL 13 02/17/2023 1140   LDLCALC 65 02/17/2023 1140    PHYSICAL EXAM:    VS:  BP 116/76 (BP Location: Left Arm, Patient Position: Sitting, Cuff Size: Normal)   Pulse 93   Ht 6\' 2"  (1.88 m)   Wt 196 lb 6.4 oz (89.1 kg)   SpO2 98%   BMI 25.22 kg/m   BMI: Body mass index is 25.22 kg/m.  Physical Exam Vitals reviewed.  Constitutional:      Appearance: He is well-developed.  HENT:     Head: Normocephalic and atraumatic.  Eyes:     General:        Right eye: No discharge.        Left eye: No discharge.  Neck:     Vascular: No JVD.  Cardiovascular:     Rate and Rhythm: Normal rate and regular rhythm.     Heart sounds: Normal heart sounds, S1 normal and S2 normal. Heart sounds not distant. No midsystolic click and no opening snap. No murmur heard.    No  friction rub.  Pulmonary:     Effort: Pulmonary effort is normal. No respiratory distress.     Breath sounds: Normal breath sounds. No decreased breath sounds, wheezing or rales.  Chest:     Chest wall: No tenderness.  Abdominal:     General: There is no distension.  Musculoskeletal:     Cervical back: Normal range of motion.     Right lower leg: No edema.     Left lower leg: No edema.  Skin:    General: Skin is warm and dry.     Nails: There is no clubbing.  Neurological:     Mental Status: He is alert and oriented to person, place, and time.  Psychiatric:        Speech: Speech normal.        Behavior: Behavior normal.        Thought Content: Thought content normal.        Judgment: Judgment normal.     Wt Readings from Last 3 Encounters:  03/22/23 196 lb 6.4 oz (89.1 kg)  03/22/23 196 lb 9.6 oz (89.2 kg)  02/17/23 199 lb 2 oz (90.3 kg)     ASSESSMENT & PLAN:   CAD involving native coronary arteries without angina: He is doing well and without symptoms concerning for angina or cardiac decompensation.  Continue aggressive risk factor modification and secondary prevention including DAPT with aspirin  and ticagrelor uninterrupted for minimum of 12 months dating back to date of PCI (09/02/2022).  He will otherwise continue losartan, Toprol-XL and titrated dose as outlined below, and rosuvastatin.  HFrEF secondary to ICM: Despite PCI and current level of GDMT (which has been limited by relative hypotension) his cardiomyopathy has persisted with only a slight improvement in LV systolic function by echo in 01/2023 with an EF improving from 35 to 40% to 40 to 45%.  Query if his cardiomyopathy is persisting in the setting of frequent PVCs with an overall 20.9% burden on recent Zio patch.  Titrate Toprol-XL with the addition of 50 mg in the evening with continuation of 100 mg in the morning along with continuation of losartan 25 mg daily.  Relative hypotension precludes further escalation of  GDMT.  If PVC burden continues to improve on titrated dose of Toprol-XL, would look to repeat a limited echo in several months time to evaluate for improvement in LV systolic function following suppression of ventricular ectopy.  If cardiomyopathy persist at that time, would look to further escalate GDMT.  Not currently requiring a standing loop diuretic.  Frequent PVCs/NSVT: Overall, palpitation burden has improved.  Total PVC burden of 20.9% on recent Zio patch.  However, there was a significant improvement in ventricular ectopy burden on day 4 when he titrated Toprol-XL to 100 mg daily.  I do wonder if his ventricular ectopy burden is contributing to his persistent cardiomyopathy as outlined above.  Given noted improvement in burden following titration of Toprol-XL, we will add an additional 50 mg of Toprol at night with continuation of 100 mg during the day.  Check magnesium.  Recent potassium normal.  I will see him back in the office in approximately 4 weeks, at which time we will reevaluate his PVC burden with consideration for possible Zio patch to evaluate for improvement in ventricular ectopy on a titrated dose of Toprol if indicated.  PSVT: One run lasting 15 beats noted on Zio patch.  Metoprolol as outlined above.  HLD: LDL 65 in 02/2023.  Remains on rosuvastatin 40 mg.  Follow-up LFTs pending.  HTN: Blood pressure is well-controlled in the office today.  Continue low-dose losartan along with titrated dose of Toprol-XL as outlined above.   Disposition: F/u with Dr. Herbie Baltimore or an APP in 4 weeks to assess PVC burden on titrated dose of metoprolol, consider repeat Zio patch, and possibly schedule follow up echo if PVC burden has improved.   Medication Adjustments/Labs and Tests Ordered: Current medicines are reviewed at length with the patient today.  Concerns regarding medicines are outlined above. Medication changes, Labs and Tests ordered today are summarized above and listed in the Patient  Instructions accessible in Encounters.   Signed, Eula Listen, PA-C 03/22/2023 4:56 PM     [facility] -  [address] [place], [place] [address] [phone]

## 2023-03-22 ENCOUNTER — Ambulatory Visit: Payer: BC Managed Care – PPO | Admitting: Family

## 2023-03-22 ENCOUNTER — Encounter: Payer: Self-pay | Admitting: Family

## 2023-03-22 ENCOUNTER — Ambulatory Visit: Payer: BC Managed Care – PPO | Attending: Physician Assistant | Admitting: Physician Assistant

## 2023-03-22 ENCOUNTER — Other Ambulatory Visit: Payer: Self-pay

## 2023-03-22 ENCOUNTER — Encounter: Payer: Self-pay | Admitting: Physician Assistant

## 2023-03-22 VITALS — BP 116/76 | HR 93 | Ht 74.0 in | Wt 196.4 lb

## 2023-03-22 VITALS — BP 128/78 | HR 97 | Temp 97.9°F | Ht 74.0 in | Wt 196.6 lb

## 2023-03-22 DIAGNOSIS — F900 Attention-deficit hyperactivity disorder, predominantly inattentive type: Secondary | ICD-10-CM | POA: Diagnosis not present

## 2023-03-22 DIAGNOSIS — I255 Ischemic cardiomyopathy: Secondary | ICD-10-CM | POA: Diagnosis not present

## 2023-03-22 DIAGNOSIS — I4729 Other ventricular tachycardia: Secondary | ICD-10-CM

## 2023-03-22 DIAGNOSIS — I2119 ST elevation (STEMI) myocardial infarction involving other coronary artery of inferior wall: Secondary | ICD-10-CM | POA: Diagnosis not present

## 2023-03-22 DIAGNOSIS — F3342 Major depressive disorder, recurrent, in full remission: Secondary | ICD-10-CM

## 2023-03-22 DIAGNOSIS — I10 Essential (primary) hypertension: Secondary | ICD-10-CM

## 2023-03-22 DIAGNOSIS — R899 Unspecified abnormal finding in specimens from other organs, systems and tissues: Secondary | ICD-10-CM | POA: Diagnosis not present

## 2023-03-22 DIAGNOSIS — I2510 Atherosclerotic heart disease of native coronary artery without angina pectoris: Secondary | ICD-10-CM | POA: Diagnosis not present

## 2023-03-22 DIAGNOSIS — I5020 Unspecified systolic (congestive) heart failure: Secondary | ICD-10-CM

## 2023-03-22 DIAGNOSIS — I5041 Acute combined systolic (congestive) and diastolic (congestive) heart failure: Secondary | ICD-10-CM

## 2023-03-22 DIAGNOSIS — E785 Hyperlipidemia, unspecified: Secondary | ICD-10-CM

## 2023-03-22 DIAGNOSIS — I493 Ventricular premature depolarization: Secondary | ICD-10-CM

## 2023-03-22 DIAGNOSIS — F325 Major depressive disorder, single episode, in full remission: Secondary | ICD-10-CM

## 2023-03-22 DIAGNOSIS — I471 Supraventricular tachycardia, unspecified: Secondary | ICD-10-CM

## 2023-03-22 MED ORDER — AMPHETAMINE-DEXTROAMPHETAMINE 20 MG PO TABS
20.0000 mg | ORAL_TABLET | Freq: Two times a day (BID) | ORAL | 0 refills | Status: DC
Start: 2023-03-22 — End: 2023-06-27
  Filled 2023-03-22 – 2023-05-25 (×2): qty 60, 30d supply, fill #0

## 2023-03-22 MED ORDER — DAPAGLIFLOZIN PROPANEDIOL 10 MG PO TABS
10.0000 mg | ORAL_TABLET | Freq: Every day | ORAL | 3 refills | Status: DC
Start: 2023-03-22 — End: 2023-10-18
  Filled 2023-03-22: qty 30, 30d supply, fill #0
  Filled 2023-05-25: qty 30, 30d supply, fill #1
  Filled 2023-07-14: qty 30, 30d supply, fill #2
  Filled 2023-08-30: qty 30, 30d supply, fill #3
  Filled 2023-10-07: qty 30, 30d supply, fill #4

## 2023-03-22 MED ORDER — METOPROLOL SUCCINATE ER 100 MG PO TB24
50.0000 mg | ORAL_TABLET | Freq: Every day | ORAL | 3 refills | Status: DC
  Filled 2023-03-22: qty 30, 30d supply, fill #0
  Filled 2023-05-25: qty 30, 30d supply, fill #1
  Filled 2023-07-14: qty 30, 30d supply, fill #2
  Filled 2023-08-30: qty 30, 30d supply, fill #3

## 2023-03-22 MED ORDER — AMPHETAMINE-DEXTROAMPHETAMINE 20 MG PO TABS
20.0000 mg | ORAL_TABLET | Freq: Two times a day (BID) | ORAL | 0 refills | Status: DC
Start: 2023-03-22 — End: 2023-06-27
  Filled 2023-03-22: qty 60, 30d supply, fill #0

## 2023-03-22 MED ORDER — AMPHETAMINE-DEXTROAMPHETAMINE 20 MG PO TABS
20.0000 mg | ORAL_TABLET | Freq: Two times a day (BID) | ORAL | 0 refills | Status: DC
Start: 2023-03-22 — End: 2023-06-27
  Filled 2023-03-22 – 2023-04-13 (×2): qty 60, 30d supply, fill #0

## 2023-03-22 MED ORDER — METOPROLOL SUCCINATE ER 100 MG PO TB24: 50.0000 mg | ORAL_TABLET | Freq: Every day | ORAL | 3 refills | Status: DC

## 2023-03-22 MED ORDER — SERTRALINE HCL 100 MG PO TABS
100.0000 mg | ORAL_TABLET | Freq: Every day | ORAL | 3 refills | Status: DC
Start: 2023-03-22 — End: 2024-04-30
  Filled 2023-03-22 – 2023-04-13 (×2): qty 30, 30d supply, fill #0
  Filled 2023-05-25: qty 30, 30d supply, fill #1
  Filled 2023-07-14 (×2): qty 30, 30d supply, fill #2
  Filled 2023-08-30 (×2): qty 30, 30d supply, fill #3
  Filled 2023-10-07: qty 30, 30d supply, fill #4
  Filled 2023-11-18: qty 30, 30d supply, fill #5
  Filled 2024-01-09: qty 30, 30d supply, fill #6
  Filled 2024-02-22: qty 30, 30d supply, fill #7

## 2023-03-22 MED ORDER — MIRTAZAPINE 15 MG PO TABS
15.0000 mg | ORAL_TABLET | Freq: Every day | ORAL | 3 refills | Status: DC
Start: 2023-03-22 — End: 2024-06-15
  Filled 2023-03-22: qty 30, 30d supply, fill #0
  Filled 2023-06-23: qty 30, 30d supply, fill #1
  Filled 2023-08-30: qty 30, 30d supply, fill #2
  Filled 2023-10-07: qty 30, 30d supply, fill #3

## 2023-03-22 NOTE — Patient Instructions (Signed)
Medication Instructions:   Start taking: metoprolol succinate 100 mg in the morning and 50 mg at night   *If you need a refill on your cardiac medications before your next appointment, please call your pharmacy*   Lab Work: Your provider would like for you to have the following labs drawn: Mg.   If you have labs (blood work) drawn today and your tests are completely normal, you will receive your results only by: MyChart Message (if you have MyChart) OR A paper copy in the mail If you have any lab test that is abnormal or we need to change your treatment, we will call you to review the results.   Testing/Procedures: none    Follow-Up: At [facility], you and your health needs are our priority.  As part of our continuing mission to provide you with exceptional heart care, we have created designated Provider Care Teams.  These Care Teams include your primary Cardiologist (physician) and Advanced Practice Providers (APPs -  Physician Assistants and Nurse Practitioners) who all work together to provide you with the care you need, when you need it.  We recommend signing up for the patient portal called "MyChart".  Sign up information is provided on this After Visit Summary.  MyChart is used to connect with patients for Virtual Visits (Telemedicine).  Patients are able to view lab/test results, encounter notes, upcoming appointments, etc.  Non-urgent messages can be sent to your provider as well.   To learn more about what you can do with MyChart, go to [email or web address].    Your next appointment:   4 week(s)  Provider:   Bryan Lemma, MD or Eula Listen, PA-C

## 2023-03-22 NOTE — Assessment & Plan Note (Signed)
Chronic, stable. Continue sertraline 100mg  with mirtazapine 15mg  at bedtime.

## 2023-03-22 NOTE — Patient Instructions (Signed)
Nice to see you!   

## 2023-03-22 NOTE — Assessment & Plan Note (Signed)
Chronic, stable.  Continue Adderall 20 mg twice daily.  I have refilled today I looked up patient on [place] Controlled Substances Reporting System PMP AWARE and saw no activity that raised concern of inappropriate use.   

## 2023-03-22 NOTE — Progress Notes (Signed)
Assessment & Plan:  Abnormal laboratory test -     Hepatic function panel  Attention deficit hyperactivity disorder (ADHD), predominantly inattentive type Assessment & Plan: Chronic, stable.  Continue Adderall 20 mg twice daily.  I have refilled today I looked up patient on [place] Controlled Substances Reporting System PMP AWARE and saw no activity that raised concern of inappropriate use.    Orders: -     Amphetamine-Dextroamphetamine; Take 1 tablet (20 mg total) by mouth 2 (two) times daily.  Dispense: 60 tablet; Refill: 0 -     Amphetamine-Dextroamphetamine; Take 1 tablet (20 mg total) by mouth 2 (two) times daily.  Dispense: 60 tablet; Refill: 0 -     Amphetamine-Dextroamphetamine; Take 1 tablet (20 mg total) by mouth 2 (two) times daily.  Dispense: 60 tablet; Refill: 0  Acute combined systolic and diastolic heart failure (HCC) -     Dapagliflozin Propanediol; Take 1 tablet (10 mg total) by mouth daily.  Dispense: 90 tablet; Refill: 3  Recurrent major depressive disorder, in full remission (HCC) -     Mirtazapine; Take 1 tablet (15 mg total) by mouth at bedtime.  Dispense: 90 tablet; Refill: 3 -     Sertraline HCl; Take 1 tablet (100 mg total) by mouth daily.  Dispense: 90 tablet; Refill: 3  Essential hypertension Assessment & Plan: Chronic, stable. Continue Toprol 100 mg daily ,losartan 25 mg daily   Major depressive disorder with single episode, in full remission [facility]) Assessment & Plan: Chronic, stable. Continue sertraline 100mg  with mirtazapine 15mg  at bedtime.      Return precautions given.   Risks, benefits, and alternatives of the medications and treatment plan prescribed today were discussed, and patient expressed understanding.   Education regarding symptom management and diagnosis given to patient on AVS either electronically or printed.  Return in about 3 months (around 06/22/2023).  Rennie Plowman, FNP  Subjective:    Patient ID: Shane [name], male     DOB: 1970/11/12, 52 y.o.   MRN: [id number]  CC: Shane [name] is a 52 y.o. male who presents today for follow up.   HPI: Feels well today No new complaints  He is doing well on Adderall 20 mg twice daily.  He requests refill.  He remains compliant with Zoloft 100 mg, mirtazapine 15 mg nightly.  Regimen is working well   Follow-up Dr. Herbie Baltimore 02/17/2023.  Increased Toprol to 100 mg daily and added losartan 25 mg daily.  Ordered 7-day Zio monitor.   He has follow-up with Done today  LDL 65  He had been using tylenol prior to recent lab work   Allergies: Patient has no known allergies. Current Outpatient Medications on File Prior to Visit  Medication Sig Dispense Refill   aspirin 81 MG chewable tablet Chew 1 tablet (81 mg total) by mouth daily. 30 tablet 1   losartan (COZAAR) 25 MG tablet Take 0.5 tablets (12.5 mg total) by mouth daily for 7 days, THEN 1 tablet (25 mg total) daily. 90 tablet 3   metoprolol succinate (TOPROL XL) 100 MG 24 hr tablet Take 1 tablet (100 mg total) by mouth daily. Take with or immediately following a meal. 90 tablet 3   rosuvastatin (CRESTOR) 40 MG tablet Take 1 tablet (40 mg total) by mouth daily. 90 tablet 0   ticagrelor (BRILINTA) 90 MG TABS tablet Take 1 tablet (90 mg total) by mouth 2 (two) times daily. 180 tablet 0   Vitamin D, Ergocalciferol, 50000 units CAPS Take 1  capsule by mouth once a week as directed for Vitamin D deficiency. 13 capsule 1   [DISCONTINUED] dapagliflozin propanediol (FARXIGA) 10 MG TABS tablet Take 1 tablet (10 mg total) by mouth daily. 30 tablet 2   [DISCONTINUED] metoprolol succinate (TOPROL-XL) 50 MG 24 hr tablet Take 1 tablet (50 mg total) by mouth daily. Take with or immediately following a meal. 30 tablet 2   No current facility-administered medications on file prior to visit.    Review of Systems  Constitutional:  Negative for chills and fever.  Respiratory:  Negative for cough.   Cardiovascular:  Negative for  chest pain and palpitations.  Gastrointestinal:  Negative for nausea and vomiting.      Objective:    BP 128/78   Pulse 97   Temp 97.9 F (36.6 C) (Oral)   Ht 6\' 2"  (1.88 m)   Wt 196 lb 9.6 oz (89.2 kg)   SpO2 97%   BMI 25.24 kg/m  BP Readings from Last 3 Encounters:  03/22/23 128/78  02/17/23 118/82  11/17/22 130/80   Wt Readings from Last 3 Encounters:  03/22/23 196 lb 9.6 oz (89.2 kg)  02/17/23 199 lb 2 oz (90.3 kg)  11/17/22 207 lb (93.9 kg)   Physical Exam Vitals reviewed.  Constitutional:      Appearance: He is well-developed.  Cardiovascular:     Rate and Rhythm: Regular rhythm.     Heart sounds: Normal heart sounds.  Pulmonary:     Effort: Pulmonary effort is normal. No respiratory distress.     Breath sounds: Normal breath sounds. No wheezing, rhonchi or rales.  Skin:    General: Skin is warm and dry.  Neurological:     Mental Status: He is alert.  Psychiatric:        Speech: Speech normal.        Behavior: Behavior normal.

## 2023-03-22 NOTE — Assessment & Plan Note (Signed)
Chronic, stable. Continue Toprol 100 mg daily ,losartan 25 mg daily

## 2023-04-04 ENCOUNTER — Encounter: Payer: Self-pay | Admitting: Family

## 2023-04-13 ENCOUNTER — Other Ambulatory Visit: Payer: Self-pay

## 2023-04-14 ENCOUNTER — Other Ambulatory Visit: Payer: Self-pay

## 2023-04-14 ENCOUNTER — Telehealth: Payer: Self-pay | Admitting: Physician Assistant

## 2023-04-27 ENCOUNTER — Telehealth: Payer: Self-pay | Admitting: Family

## 2023-04-27 NOTE — Telephone Encounter (Signed)
Call pt  Please review result note from 04/04/23 as he has not read  He needs to stop adderall at this time until he has completed work up with cardiology and had follow up with ryan dunn. He will need cardiology to advise when he can resume

## 2023-04-27 NOTE — Telephone Encounter (Signed)
LVM to call back to inform pt that  He needs to stop adderall at this time until he has completed work up with cardiology and had follow up with Eula Listen. He will need cardiology to advise when he can resume

## 2023-05-02 NOTE — Telephone Encounter (Signed)
LVM to call back to inform pt that  He needs to stop adderall at this time until he has completed work up with cardiology and had follow up with Eula Listen. He will need cardiology to advise when he can resume

## 2023-05-03 NOTE — Progress Notes (Unsigned)
Cardiology Office Note    Date:  05/04/2023   ID:  Shane [name], DOB April 16, 1971, MRN [id number]  PCP:  Allegra Grana, FNP  Cardiologist:  Bryan Lemma, MD  Electrophysiologist:  None   Chief Complaint: Follow up  History of Present Illness:   Shane [name] is a 52 y.o. male with history of CAD with inferior ST elevation MI in 08/2022 status post thrombectomy with PCI and 2 overlapping DES, HFrEF secondary to ICM, frequent PVCs, HTN, HLD, ADD, anxiety, and prior tobacco use who presents for follow-up of his CAD, ICM, and frequent PVCs.   He was admitted in 08/2022 with inferior ST elevation MI with suspected RV infarct.  LHC showed thrombotic occlusion of the RCA with large thrombus burden.  This was treated successfully with aspiration thrombectomy and 2 overlapping drug-eluting stents.  There was no other obstructive disease.  Echo at that time showed an EF of 35 to 40%, global hypokinesis, grade 1 diastolic dysfunction, mildly reduced RV systolic function with normal ventricular cavity size, normal PASP, aortic sclerosis without evidence of stenosis.  Relative hypotension has precluded escalation of GDMT.  Most recent echo from 01/2023 demonstrated an EF of 40 to 45%, global hypokinesis, mildly dilated internal LV cavity size, grade 1 diastolic dysfunction, normal RV systolic function and ventricular cavity size, and mild mitral regurgitation.  He was seen in the office in 02/2023 for follow-up of his echo and was without symptoms of angina or cardiac decompensation.  He did feel like his stamina was not where he used to be.  He also reported a couple episodes of palpitations.  EKG at that time was notable for sinus rhythm with frequent PVCs.  Toprol was titrated to 100 mg daily (on day 4 of Zio patch) and losartan 25 mg daily was added.  Subsequent Zio patch showed a predominant rhythm of sinus with an average rate of 92 bpm (range 54 to 231 bpm), 2 episodes of NSVT occurred with  the fastest interval lasting 4 beats with a maximum rate of 184 bpm and the longest interval lasting 8 beats with an average rate of 140 bpm, 1 run of SVT occurred lasting 15 beats with a maximum rate of 231 bpm (average 209 bpm), rare PACs and atrial couplets, frequent PVCs with an overall burden of 20.9%, and rare ventricular couplets/triplets.  Ventricular bigeminy and trigeminy were present.  Upon reviewing ventricular ectopy burden, after he titrated from Toprol-XL to 100 mg daily on day 4 of his Zio patch, his ectopy burden was significantly improved, decreasing by approximately 50%.  He was last seen in the office on 03/15/2023 and remained without symptoms of angina or cardiac decompensation.  Following addition of losartan and titration of Toprol he did note some positional dizziness that had subsequently improved.  He felt like palpitation burden had improved somewhat as well.  Toprol was further titrated with addition to 50 mg in the evening with continuation of 100 mg in the morning.  He comes in doing well from a cardiac perspective and is without symptoms of angina or cardiac decompensation.  Since further titration of metoprolol he has not had any further palpitations.  No dizziness, presyncope, or syncope.  No lower extremity swelling, abdominal distention, early satiety, or progressive orthopnea.  His weight is up 10 pounds today when compared to his last visit which she attributes to caloric intake following a recent trip to the [place].  He does continue to take Adderall on days where  he goes to work.   Labs independently reviewed: 02/2023 - potassium 3.8, BUN 12, serum creatinine 0.98, albumin 4.2, AST 44, ALT 53, TC 135, TG 65, HDL 57, LDL 65 09/2022 - LP(a) 9.5, Hgb 15.0, PLT 279 07/2022 - A1c 5.8 09/2021 - TSH normal  Past Medical History:  Diagnosis Date   Acute ST elevation myocardial infarction (STEMI) of inferolateral wall (HCC) 09/02/2022   Extensive thrombus in the  RCA-thrombectomy with overlapping 3.5 mm stents (38 and 30 mm)   ADD (attention deficit disorder)    Alcohol abuse    History of.   Anxiety    Chronic HFrEF (heart failure with reduced ejection fraction) (HCC)    a. 09/2022 Echo: EF 35-40%, glob HK, GrI DD, mildly reduced RV fxn, RVSP 31.66mmHg, no significant valvular disease.   Coronary artery disease involving native coronary artery of native heart 08/2022   a. 08/2022 Inflat STEMI/PCI: LM nl, LAD nl, D1/2 nl, RI nl, LCX nl, OM1/2 nl, RCA 100p/m (thrombectomy & 3.5x38 & 3.5x30 overlapping Onyx Frontier DESs), 27m/d (thombectomy and PTCA into RPAV branch), RPDA 100, RPL1 100, EF 45-50%.   Depression    Frequent PVCs 02/17/2023   Hyperlipidemia    Hypertension    Ischemic cardiomyopathy    a. 09/2022 Echo: EF 35-40%.   Ischemic cardiomyopathy 02/17/2023   09/2022 (inferior STEMI) echo: EF 35-40%, glob HK, GrI DD, mildly reduced RV fxn, RVSP 31.39mmHg, no significant valvular disease.  TTE follow-up 01/06/2023: EF remains 40 to 45% with mild to moderate dysfunction       Past Surgical History:  Procedure Laterality Date   CORONARY/GRAFT ACUTE MI REVASCULARIZATION N/A 09/02/2022   Procedure: Coronary/Graft Acute MI Revascularization;  Surgeon: Marykay Lex, MD;  Location: [facility] INVASIVE CV LAB;  Service: Cardiovascular;  Laterality: N/A;   LEFT HEART CATH AND CORS/GRAFTS ANGIOGRAPHY N/A 09/02/2022   Procedure: LEFT HEART CATH AND CORS/GRAFTS ANGIOGRAPHY;  Surgeon: Marykay Lex, MD;  Location: [facility] INVASIVE CV LAB;  Service: Cardiovascular;  Laterality: N/A;   TENDON REPAIR Right 09/15/2015   Procedure: RIGHT FOREARM EXPLORATION AND CLOSURE OF LACERATION, TENDON REPAIR;  Surgeon: Bradly Bienenstock, MD;  Location: [facility] OR;  Service: Orthopedics;  Laterality: Right;    Current Medications: Current Meds  Medication Sig   amphetamine-dextroamphetamine (ADDERALL) 20 MG tablet Take 1 tablet (20 mg total) by mouth 2 (two) times daily.    amphetamine-dextroamphetamine (ADDERALL) 20 MG tablet Take 1 tablet (20 mg total) by mouth 2 (two) times daily.   aspirin 81 MG chewable tablet Chew 1 tablet (81 mg total) by mouth daily.   dapagliflozin propanediol (FARXIGA) 10 MG TABS tablet Take 1 tablet (10 mg total) by mouth daily.   losartan (COZAAR) 25 MG tablet Take 0.5 tablets (12.5 mg total) by mouth daily for 7 days, THEN 1 tablet (25 mg total) daily.   metoprolol succinate (TOPROL XL) 100 MG 24 hr tablet Take 0.5-1 tablets (50-100 mg total) by mouth daily. Take 100 mg in the morning and 50 mg at bedtime   mirtazapine (REMERON) 15 MG tablet Take 1 tablet (15 mg total) by mouth at bedtime.   sertraline (ZOLOFT) 100 MG tablet Take 1 tablet (100 mg total) by mouth daily.   Vitamin D, Ergocalciferol, 50000 units CAPS Take 1 capsule by mouth once a week as directed for Vitamin D deficiency.   [DISCONTINUED] rosuvastatin (CRESTOR) 40 MG tablet Take 1 tablet (40 mg total) by mouth daily.   [DISCONTINUED] ticagrelor (BRILINTA) 90 MG TABS tablet  Take 1 tablet (90 mg total) by mouth 2 (two) times daily.    Allergies:   Patient has no known allergies.   Social History   Socioeconomic History   Marital status: Divorced    Spouse name: Bartlomiej Burrs   Number of children: 2   Years of education: 14   Highest education level: Not on file  Occupational History    Comment: [employer]  Tobacco Use   Smoking status: Some Days    Current packs/day: 0.00    Average packs/day: 0.3 packs/day for 20.0 years (5.0 ttl pk-yrs)    Types: Cigarettes    Start date: 09/02/2002    Last attempt to quit: 09/02/2022    Years since quitting: 0.6   Smokeless tobacco: Never   Tobacco comments:    1/2 PPW; 02/17/2023  Vaping Use   Vaping status: Never Used  Substance and Sexual Activity   Alcohol use: No    Comment: no alcohol since 12/16/2012   Drug use: No   Sexual activity: Yes    Partners: Female  Other Topics Concern   Not on file   Social History Narrative   Divorced in 08/2020; they have  two daughters (22 and 35 )    In the past-Rebuilds induction coils for work   05/2022- started [employer]   Social Determinants of Health   Financial Resource Strain: Not on file  Food Insecurity: Not on file  Transportation Needs: Not on file  Physical Activity: Not on file  Stress: Not on file  Social Connections: Not on file     Family History:  The patient's family history includes Alcohol abuse in his maternal aunt and paternal grandfather; Heart attack (age of onset: 64) in his paternal uncle; Heart attack (age of onset: 43) in his maternal grandfather; Heart attack (age of onset: 23) in his maternal great-grandfather; Hyperlipidemia in his father and another family member; Hypertension in his cousin and father; Lung cancer in his maternal grandfather; Prostate cancer in his father.  ROS:   12-point review of systems is negative unless otherwise noted in the HPI.   EKGs/Labs/Other Studies Reviewed:    Studies reviewed were summarized above. The additional studies were reviewed today:  Zio patch 02/2023:   ~9-day Zio patch monitor (May 2024)   Predominant Underlying Rhythm-Sinus: HR range 54-139 bpm, Avg 89 bpm.   Frequent PVCs ~21% with rare couplets and triplets, but up to 20 minutes of bigeminy/trigeminy noted.  Symptoms noted with sinus rhythm and either isolated PVCs or PVCs and bigeminy.   Rare PACs.   2 short bursts of PVCs (Nonsustained Ventricular Tachycardia) -> fastest was 4 beats (1.7 seconds) HR 110-184 bpm, Avg 151 bpm. => Goes from bigeminy to a fusion beat bigeminy followed by another physician..  Not true V. tach.   1 atrial run of 15 beats - HR range 181 to 231 bpm, Avg 209 bpm (4.7 sec)   No Sustained Arrhythmias: Atrial Tachycardia (AT), Supraventricular Tachycardia (SVT), Atrial Fibrillation (A-Fib), Atrial Flutter (A-Flutter), Sustained Ventricular Tachycardia (VT) __________   2D echo  01/06/2023: 1. Left ventricular ejection fraction, by estimation, is 40 to 45%. The  left ventricle has mild to moderately decreased function. The left  ventricle demonstrates global hypokinesis. The left ventricular internal  cavity size was mildly dilated. Left  ventricular diastolic parameters are consistent with Grade I diastolic  dysfunction (impaired relaxation). The average left ventricular global  longitudinal strain is -13.9 %. The global longitudinal strain is  abnormal.   2. Right ventricular systolic function is normal. The right ventricular  size is normal.   3. The mitral valve is normal in structure. Mild mitral valve  regurgitation.   4. The aortic valve is tricuspid. Aortic valve regurgitation is mild.   5. The inferior vena cava is dilated in size with <50% respiratory  variability, suggesting right atrial pressure of 15 mmHg.  __________   2D echo 09/03/2022: 1. Left ventricular ejection fraction, by estimation, is 35 to 40%. The  left ventricle has moderately decreased function. The left ventricle  demonstrates global hypokinesis. Left ventricular diastolic parameters are  consistent with Grade I diastolic  dysfunction (impaired relaxation).   2. Right ventricular systolic function is mildly reduced. The right  ventricular size is normal. There is normal pulmonary artery systolic  pressure. The estimated right ventricular systolic pressure is 31.8 mmHg.   3. The mitral valve is normal in structure. No evidence of mitral valve  regurgitation. No evidence of mitral stenosis.   4. The aortic valve is normal in structure. Aortic valve regurgitation is  not visualized. Aortic valve sclerosis is present, with no evidence of  aortic valve stenosis.   5. The inferior vena cava is dilated in size with <50% respiratory  variability, suggesting right atrial pressure of 15 mmHg.  __________   LHC 09/02/2022:   Prox RCA to Mid RCA lesion is 100% stenosed.   Balloon  angioplasty was performed using a BALLN TREK RX 3.0X20.   Aspiration thrombectomy performed using Penumbra catheter   A drug-eluting stent was successfully placed using a STENT ONYX FRONTIER 3.5X38.  Postdilated to 3.6 mm   A 2nd drug-eluting stent was successfully placed overlapping the previous stent proximally to cover the entire lesion, using a STENT ONYX FRONTIER 3.5X30.  Postdilated to 3.65 mm   Post intervention, there is a 0% residual stenosis for both stents.   -------------------------   Mid RCA to Dist RCA lesion is 95% stenosed with 95% stenosed side branch in RPAV.   Balloon angioplasty was performed using a BALLN TREK RX 3.0X20 -with minimal benefit.   Aspiration thrombectomy performed using Penumbra catheter from the distal RCA although into the PL branch restoring TIMI-3 flow.   Post intervention, there is a 0% residual stenosis. Post intervention, the side branch was reduced to 0% residual stenosis.   1st RPL lesion is 100% stenosed.  RPDA lesion is 100% stenosed -slow flow to the apex because of distal embolization.   -------------------------   Normal Left Coronary System   There is mild to moderate left ventricular systolic dysfunction.  The left ventricular ejection fraction is 45-50% by visual estimate -> basal to mid inferior wall severe hypo to akinesis   LV end diastolic pressure is moderately elevated.-24 mmHg   There is no aortic valve stenosis.   POST-CATH DIAGNOSES Acute Inferolateral STEMI-suspect possible RV involvement with mildly reduced EF and basal to mid inferior akinesis to severe hypokinesis.  EF estimated roughly 45% CULPRIT Lesion is 100% near still RCA heavily thrombotic occlusion with extensive thrombus throughout the entire RCA requiring Penumbra thrombectomy to both the proximal and distal portion of the vessel finally restoring TIMI-3 flow to all but the very apical portions of the RPL and PDA. =>  The proximal to mid will disease vessel had received  treated with 2 overlapping Onyx Frontier DES Stents (3.5 mm 38 mm and 3.5 mm x 30 mm with roughly 2 to 3 mm overlap.  Stents were post-dilated to  3.65 mm) => TIMI 0 flow reduced to TIMI-3 flow with 0% residual stenosis   Angiographically normal Left Coronary System with a non--dominant Cx and a Large Ramus Intermedius that is the same size as it is a wraparound LAD.  Temporary hypotension not unexpected with RV infarct, responded well to 2 500 mL boluses and short run of Levophed that was reduced and weaned off prior to leaving the Cath Lab.  Blood pressures were in the 110/80 range.     RECOMMENDATIONS Admit to ICU-we will continue Aggrastat for 12 hours post PCI Check 2D echo in the morning-(already ordered) Cycle troponins with troponin level in the morning. We will start low-dose metoprolol 12.5 mg twice daily-titrate if possible. High-dose statin-check lipid panel in the morning. Smoke cessation counseling-he says that he will quit smoking.   EKG:  EKG is ordered today.  The EKG ordered today demonstrates NSR, 83 bpm, prior inferior infarct with inferior T wave inversion consistent with prior known inferior MI, no further PVCs.  Telemetry with sinus rhythm and no ventricular ectopy  Recent Labs: 09/03/2022: Hemoglobin 15.0; Platelets 279 02/17/2023: ALT 53; BUN 12; Creatinine, Ser 0.98; Potassium 3.8; Sodium 137  Recent Lipid Panel    Component Value Date/Time   CHOL 135 02/17/2023 1140   TRIG 65 02/17/2023 1140   HDL 57 02/17/2023 1140   CHOLHDL 2.4 02/17/2023 1140   VLDL 13 02/17/2023 1140   LDLCALC 65 02/17/2023 1140    PHYSICAL EXAM:    VS:  BP 136/86 (BP Location: Left Arm, Patient Position: Sitting, Cuff Size: Normal)   Pulse 83   Ht 6\' 2"  (1.88 m)   Wt 206 lb 9.6 oz (93.7 kg)   SpO2 99%   BMI 26.53 kg/m   BMI: Body mass index is 26.53 kg/m.  Physical Exam Vitals reviewed.  Constitutional:      Appearance: He is well-developed.  HENT:     Head: Normocephalic  and atraumatic.  Eyes:     General:        Right eye: No discharge.        Left eye: No discharge.  Neck:     Vascular: No JVD.  Cardiovascular:     Rate and Rhythm: Normal rate and regular rhythm.     Pulses:          Posterior tibial pulses are 2+ on the right side and 2+ on the left side.     Heart sounds: Normal heart sounds, S1 normal and S2 normal. Heart sounds not distant. No midsystolic click and no opening snap. No murmur heard.    No friction rub.  Pulmonary:     Effort: Pulmonary effort is normal. No respiratory distress.     Breath sounds: Normal breath sounds. No decreased breath sounds, wheezing or rales.  Chest:     Chest wall: No tenderness.  Abdominal:     General: There is no distension.  Musculoskeletal:     Cervical back: Normal range of motion.     Right lower leg: No edema.     Left lower leg: No edema.  Skin:    General: Skin is warm and dry.     Nails: There is no clubbing.  Neurological:     Mental Status: He is alert and oriented to person, place, and time.  Psychiatric:        Speech: Speech normal.        Behavior: Behavior normal.        Thought Content:  Thought content normal.        Judgment: Judgment normal.     Wt Readings from Last 3 Encounters:  05/04/23 206 lb 9.6 oz (93.7 kg)  03/22/23 196 lb 6.4 oz (89.1 kg)  03/22/23 196 lb 9.6 oz (89.2 kg)     ASSESSMENT & PLAN:   CAD involving the native coronary arteries without angina: He continues to do well and is without symptoms of angina or cardiac decompensation.  Continue aggressive risk factor modification and secondary prevention including aspirin and ticagrelor uninterrupted for a minimum of 12 months dating back to date of PCI (09/02/2022).  He otherwise will continue losartan, Toprol-XL, and rosuvastatin.  Plan to pursue follow-up limited echo in 2 months time as outlined below to reevaluate cardiomyopathy with improved PVC burden.  Given lack of anginal symptoms, defer further  ischemic testing at this time (discussed with interventional cardiology).  However, may need to consider Lexiscan MPI to evaluate for high risk ischemia.  HFrEF secondary to possibly mixed ICM and NICM: Euvolemic and well compensated with NYHA class I symptoms.  Echo from 01/2023 showed a slight improvement in LV systolic function from 35 to 40% to 40 to 45%.  Query if his persistent cardiomyopathy, despite PCI and GDMT, is in the setting of frequent PVCs.  He does continue to take Adderall on work days.  Now that his PVC burden is improved, look to repeat a limited echo in 2 months time to evaluate for improvement in LV systolic function.  Should his cardiomyopathy persist at that time would consider further cardiac imaging, including cardiac MRI.  Relative hypotension as previously precluded escalation of GDMT.  Continue current dose Toprol-XL and losartan.  Should his cardiomyopathy persist on follow-up echo, would recommend escalation of GDMT as BP allows.  Not currently requiring a standing loop diuretic.  Frequent PVCs/NSVT: Total PVC burden 20.9% on recent Zio patch.  Overall, it appears ventricular ectopy burden has significantly improved with escalation of Toprol-XL.  No PVCs noted on twelve-lead EKG or rhythm strip in the office today.  PVC burden was also noted to be improving Zio patch following escalation of metoprolol.  No further palpitations.  Query if ventricular ectopy has been contributing to his persistent cardiomyopathy.  Felt to be less likely in the setting progressive CAD given lack of anginal symptoms.  Continue Toprol-XL 100 mg a day and 50 mg at night.  Repeat limited echo in 2 months time as outlined above.  At that time, consider 3-day Zio patch to quantify PVC burden  PSVT: Quiescent.  One run lasting 15 beats noted on Zio patch.  He remains on metoprolol as outlined above.  HLD: LDL 65 in 02/2023.  He remains on rosuvastatin 40 mg.  HTN: Blood pressure is well controlled in the  office today.  Continue losartan and Toprol-XL as outlined above.   Disposition: F/u with Dr. Herbie Baltimore or an APP in 2 months.   Medication Adjustments/Labs and Tests Ordered: Current medicines are reviewed at length with the patient today.  Concerns regarding medicines are outlined above. Medication changes, Labs and Tests ordered today are summarized above and listed in the Patient Instructions accessible in Encounters.   Signed, Eula Listen, PA-C 05/04/2023 12:22 PM     [facility] -  [address] [place], [place] [address] [phone]

## 2023-05-03 NOTE — Telephone Encounter (Signed)
Mail letter. 

## 2023-05-04 ENCOUNTER — Other Ambulatory Visit: Payer: Self-pay

## 2023-05-04 ENCOUNTER — Ambulatory Visit: Payer: BC Managed Care – PPO | Attending: Physician Assistant | Admitting: Physician Assistant

## 2023-05-04 ENCOUNTER — Encounter: Payer: Self-pay | Admitting: Physician Assistant

## 2023-05-04 VITALS — BP 136/86 | HR 83 | Ht 74.0 in | Wt 206.6 lb

## 2023-05-04 DIAGNOSIS — I2510 Atherosclerotic heart disease of native coronary artery without angina pectoris: Secondary | ICD-10-CM | POA: Diagnosis not present

## 2023-05-04 DIAGNOSIS — I4729 Other ventricular tachycardia: Secondary | ICD-10-CM | POA: Diagnosis not present

## 2023-05-04 DIAGNOSIS — I493 Ventricular premature depolarization: Secondary | ICD-10-CM | POA: Diagnosis not present

## 2023-05-04 DIAGNOSIS — I5020 Unspecified systolic (congestive) heart failure: Secondary | ICD-10-CM | POA: Diagnosis not present

## 2023-05-04 DIAGNOSIS — I10 Essential (primary) hypertension: Secondary | ICD-10-CM

## 2023-05-04 DIAGNOSIS — I471 Supraventricular tachycardia, unspecified: Secondary | ICD-10-CM

## 2023-05-04 MED ORDER — ROSUVASTATIN CALCIUM 40 MG PO TABS
40.0000 mg | ORAL_TABLET | Freq: Every day | ORAL | 6 refills | Status: DC
  Filled 2023-05-04: qty 30, 30d supply, fill #0
  Filled 2023-06-23: qty 30, 30d supply, fill #1
  Filled 2023-08-30 (×2): qty 30, 30d supply, fill #2
  Filled 2023-10-07: qty 30, 30d supply, fill #3
  Filled 2023-11-18: qty 30, 30d supply, fill #4
  Filled 2024-01-09: qty 30, 30d supply, fill #5
  Filled 2024-02-22: qty 30, 30d supply, fill #6

## 2023-05-04 MED ORDER — TICAGRELOR 90 MG PO TABS
90.0000 mg | ORAL_TABLET | Freq: Two times a day (BID) | ORAL | 5 refills | Status: DC
Start: 2023-05-04 — End: 2023-10-18
  Filled 2023-05-04: qty 60, 30d supply, fill #0
  Filled 2023-08-30: qty 60, 30d supply, fill #1
  Filled 2023-10-07: qty 60, 30d supply, fill #2

## 2023-05-04 NOTE — Patient Instructions (Signed)
Medication Instructions:  Your physician recommends that you continue on your current medications as directed. Please refer to the Current Medication list given to you today.    *If you need a refill on your cardiac medications before your next appointment, please call your pharmacy*   Lab Work:  NO LABWORK ORDERED   Testing/Procedures: 1: Limited Echo  Your physician has requested that you have an echocardiogram. Echocardiography is a painless test that uses sound waves to create images of your heart. It provides your doctor with information about the size and shape of your heart and how well your heart's chambers and valves are working. This procedure takes approximately one hour. There are no restrictions for this procedure. There is a possibility that an IV may need to be started during your test to inject an image enhancing agent. This is done to obtain more optimal pictures of your heart. Therefore we ask that you do at least drink some water prior to coming in to hydrate your veins.    Please do NOT wear cologne, perfume, aftershave, or lotions (deodorant is allowed). Please arrive 15 minutes prior to your appointment time.    Follow-Up: At [facility], you and your health needs are our priority.  As part of our continuing mission to provide you with exceptional heart care, we have created designated Provider Care Teams.  These Care Teams include your primary Cardiologist (physician) and Advanced Practice Providers (APPs -  Physician Assistants and Nurse Practitioners) who all work together to provide you with the care you need, when you need it.  We recommend signing up for the patient portal called "MyChart".  Sign up information is provided on this After Visit Summary.  MyChart is used to connect with patients for Virtual Visits (Telemedicine).  Patients are able to view lab/test results, encounter notes, upcoming appointments, etc.  Non-urgent messages can be sent to your  provider as well.   To learn more about what you can do with MyChart, go to [email or web address].    Your next appointment:   3 month(s)  Provider:   You may see Bryan Lemma, MD or one of the following Advanced Practice Providers on your designated Care Team:   Eula Listen, PA-C   Other Instructions Echocardiogram An echocardiogram is a test that uses sound waves to make images of your heart. This way of making images is often called ultrasound. The images from this test can help find out many things about your heart, including: The size and shape of your heart. The strength of your heart muscle and how well it's working. The size, thickness, and movement of your heart's walls. How your heart valves are working. Problems such as: A tumor or a growth from an infection around the heart valves. Areas of heart muscle that aren't working well because of poor blood flow or injury from a heart attack. An aneurysm. This is a weak or damaged part of an artery wall. An artery is a blood vessel. Tell a health care provider about: Any allergies you have. All medicines you're taking, including vitamins, herbs, eye drops, creams, and over-the-counter medicines. Any bleeding problems you have. Any surgeries you've had. Any medical problems you have. Whether you're pregnant or may be pregnant. What are the risks? Your health care provider will talk with you about risks. These may include an allergic reaction to IV dye that may be used during the test. What happens before the test? You don't need to do anything to get  ready for this test. You may eat and drink normally. What happens during the test?  You'll take off your clothes from the waist up and put on a hospital gown. Sticky patches called electrodes may be placed on your chest. These will be connected to a machine that monitors your heart rate and rhythm. You'll lie down on a table for the exam. A wand covered in gel will be moved over  your chest. Sound waves from the wand will go to your heart and bounce back--or "echo" back. The sound waves will go to a computer that uses them to make images of your heart. The images can be viewed on a monitor. The images will also be recorded on the computer so your provider can look at them later. You may be asked to change positions or hold your breath for a short time. This makes it easier to get different views or better views of your heart. In some cases, you may be given a dye through an IV. The IV is put into one of your veins. This dye can make the areas of your heart easier to see. The procedure may vary among providers and hospitals. What can I expect after the test? You may return to your normal diet, activities, and medicines unless your provider tells you not to. If an IV was placed for the test, it will be removed. It's up to you to get the results of your test. Ask your provider, or the department that's doing the test, when your results will be ready. This information is not intended to replace advice given to you by your health care provider. Make sure you discuss any questions you have with your health care provider. Document Revised: 11/19/2022 Document Reviewed: 11/19/2022 Elsevier Patient Education  2024 [employer].

## 2023-05-10 NOTE — Telephone Encounter (Signed)
Letter mailed on 05/10/23

## 2023-05-17 ENCOUNTER — Encounter: Payer: Self-pay | Admitting: Family

## 2023-05-19 ENCOUNTER — Encounter (INDEPENDENT_AMBULATORY_CARE_PROVIDER_SITE_OTHER): Payer: Self-pay

## 2023-05-25 ENCOUNTER — Other Ambulatory Visit: Payer: Self-pay

## 2023-06-01 ENCOUNTER — Other Ambulatory Visit: Payer: Self-pay

## 2023-06-23 ENCOUNTER — Other Ambulatory Visit: Payer: Self-pay | Admitting: Physician Assistant

## 2023-06-23 DIAGNOSIS — I10 Essential (primary) hypertension: Secondary | ICD-10-CM

## 2023-06-23 DIAGNOSIS — I493 Ventricular premature depolarization: Secondary | ICD-10-CM

## 2023-06-23 DIAGNOSIS — I5020 Unspecified systolic (congestive) heart failure: Secondary | ICD-10-CM

## 2023-06-23 DIAGNOSIS — I2510 Atherosclerotic heart disease of native coronary artery without angina pectoris: Secondary | ICD-10-CM

## 2023-06-23 DIAGNOSIS — I4729 Other ventricular tachycardia: Secondary | ICD-10-CM

## 2023-06-23 DIAGNOSIS — I471 Supraventricular tachycardia, unspecified: Secondary | ICD-10-CM

## 2023-06-27 ENCOUNTER — Other Ambulatory Visit: Payer: Self-pay

## 2023-06-27 ENCOUNTER — Encounter: Payer: Self-pay | Admitting: Family

## 2023-06-27 ENCOUNTER — Ambulatory Visit: Payer: BC Managed Care – PPO | Admitting: Family

## 2023-06-27 VITALS — BP 125/77 | HR 72 | Temp 98.3°F | Ht 74.0 in | Wt 202.0 lb

## 2023-06-27 DIAGNOSIS — F3342 Major depressive disorder, recurrent, in full remission: Secondary | ICD-10-CM

## 2023-06-27 DIAGNOSIS — F900 Attention-deficit hyperactivity disorder, predominantly inattentive type: Secondary | ICD-10-CM | POA: Diagnosis not present

## 2023-06-27 DIAGNOSIS — Z8639 Personal history of other endocrine, nutritional and metabolic disease: Secondary | ICD-10-CM | POA: Diagnosis not present

## 2023-06-27 LAB — VITAMIN D 25 HYDROXY (VIT D DEFICIENCY, FRACTURES): VITD: 24.25 ng/mL — ABNORMAL LOW (ref 30.00–100.00)

## 2023-06-27 MED ORDER — AMPHETAMINE-DEXTROAMPHETAMINE 20 MG PO TABS
20.0000 mg | ORAL_TABLET | Freq: Two times a day (BID) | ORAL | 0 refills | Status: DC
  Filled 2023-06-27 – 2023-08-30 (×2): qty 60, 30d supply, fill #0

## 2023-06-27 MED ORDER — AMPHETAMINE-DEXTROAMPHETAMINE 20 MG PO TABS
20.0000 mg | ORAL_TABLET | Freq: Two times a day (BID) | ORAL | 0 refills | Status: DC
  Filled 2023-06-27 – 2023-07-14 (×2): qty 60, 30d supply, fill #0

## 2023-06-27 MED ORDER — AMPHETAMINE-DEXTROAMPHETAMINE 20 MG PO TABS
20.0000 mg | ORAL_TABLET | Freq: Two times a day (BID) | ORAL | 0 refills | Status: DC
  Filled 2023-06-27 – 2023-10-07 (×2): qty 60, 30d supply, fill #0

## 2023-06-27 NOTE — Assessment & Plan Note (Signed)
Chronic, stable.  Continue Zoloft 100 mg, Remeron 15mg 

## 2023-06-27 NOTE — Assessment & Plan Note (Addendum)
Currently symptoms are stable.  He is following closely with Eula Listen, Dr. Herbie Baltimore.  Patient and I agreed that we would remain on Adderall 20 mg twice daily only on workdays to limit medication side effects and aggravation of PVCs. He will continue close follow-up with cardiology. Refilled today

## 2023-06-27 NOTE — Progress Notes (Signed)
Assessment & Plan:  Attention deficit hyperactivity disorder (ADHD), predominantly inattentive type Assessment & Plan: Currently symptoms are stable.  He is following closely with Eula Listen, Dr. Herbie Baltimore.  Patient and I agreed that we would remain on Adderall 20 mg twice daily only on workdays to limit medication side effects and aggravation of PVCs. He will continue close follow-up with cardiology. Refilled today  Orders: -     Amphetamine-Dextroamphetamine; Take 1 tablet (20 mg total) by mouth 2 (two) times daily.  Dispense: 60 tablet; Refill: 0 -     Amphetamine-Dextroamphetamine; Take 1 tablet (20 mg total) by mouth 2 (two) times daily.  Dispense: 60 tablet; Refill: 0 -     Amphetamine-Dextroamphetamine; Take 1 tablet (20 mg total) by mouth 2 (two) times daily.  Dispense: 60 tablet; Refill: 0  History of vitamin D deficiency -     VITAMIN D 25 Hydroxy (Vit-D Deficiency, Fractures)  Recurrent major depressive disorder, in full remission (HCC) Assessment & Plan: Chronic, stable.  Continue Zoloft 100 mg, Remeron 15mg       Return precautions given.   Risks, benefits, and alternatives of the medications and treatment plan prescribed today were discussed, and patient expressed understanding.   Education regarding symptom management and diagnosis given to patient on AVS either electronically or printed.  Return in about 3 months (around 09/26/2023).  Rennie Plowman, FNP  Subjective:    Patient ID: [name], male    DOB: [birth year], 52 y.o.   MRN: [id number]  CC: CHRISTIANJACOB MCCREDIE is a 52 y.o. male who presents today for follow up.   HPI: Feels well today.  No new complaints.  Denies chest pain, shortness of breath.  He reports occasionally he may notice a flutter in his chest.  This is not sustained or associated with pain.  It is infrequent.    He is staying active.  Recently traveled to the [place].    He is taking Adderall 20 mg twice daily only on the days that  he works.    BP at home on average 125/ 77-82      Allergies: Patient has no known allergies. Current Outpatient Medications on File Prior to Visit  Medication Sig Dispense Refill   aspirin 81 MG chewable tablet Chew 1 tablet (81 mg total) by mouth daily. 30 tablet 1   dapagliflozin propanediol (FARXIGA) 10 MG TABS tablet Take 1 tablet (10 mg total) by mouth daily. 90 tablet 3   losartan (COZAAR) 25 MG tablet Take 0.5 tablets (12.5 mg total) by mouth daily for 7 days, THEN 1 tablet (25 mg total) daily. 90 tablet 3   metoprolol succinate (TOPROL XL) 100 MG 24 hr tablet Take 0.5-1 tablets (50-100 mg total) by mouth daily. Take 100 mg in the morning and 50 mg at bedtime 90 tablet 3   mirtazapine (REMERON) 15 MG tablet Take 1 tablet (15 mg total) by mouth at bedtime. 90 tablet 3   rosuvastatin (CRESTOR) 40 MG tablet Take 1 tablet (40 mg total) by mouth daily. 30 tablet 6   sertraline (ZOLOFT) 100 MG tablet Take 1 tablet (100 mg total) by mouth daily. 90 tablet 3   ticagrelor (BRILINTA) 90 MG TABS tablet Take 1 tablet (90 mg total) by mouth 2 (two) times daily. 60 tablet 5   [DISCONTINUED] dapagliflozin propanediol (FARXIGA) 10 MG TABS tablet Take 1 tablet (10 mg total) by mouth daily. 30 tablet 2   [DISCONTINUED] metoprolol succinate (TOPROL-XL) 50 MG 24 hr tablet Take  1 tablet (50 mg total) by mouth daily. Take with or immediately following a meal. 30 tablet 2   No current facility-administered medications on file prior to visit.    Review of Systems  Constitutional:  Negative for chills and fever.  Respiratory:  Negative for cough and shortness of breath.   Cardiovascular:  Negative for chest pain and palpitations (infrequent).  Gastrointestinal:  Negative for nausea and vomiting.      Objective:    BP 125/77   Pulse 72   Temp 98.3 F (36.8 C) (Oral)   Ht 6\' 2"  (1.88 m)   Wt 202 lb (91.6 kg)   SpO2 96%   BMI 25.94 kg/m  BP Readings from Last 3 Encounters:  06/27/23 125/77   05/04/23 136/86  03/22/23 116/76   Wt Readings from Last 3 Encounters:  06/27/23 202 lb (91.6 kg)  05/04/23 206 lb 9.6 oz (93.7 kg)  03/22/23 196 lb 6.4 oz (89.1 kg)   .imag Physical Exam Vitals reviewed.  Constitutional:      Appearance: He is well-developed.  Cardiovascular:     Rate and Rhythm: Regular rhythm.     Heart sounds: Normal heart sounds.  Pulmonary:     Effort: Pulmonary effort is normal. No respiratory distress.     Breath sounds: Normal breath sounds. No wheezing, rhonchi or rales.  Skin:    General: Skin is warm and dry.  Neurological:     Mental Status: He is alert.  Psychiatric:        Speech: Speech normal.        Behavior: Behavior normal.

## 2023-07-05 ENCOUNTER — Ambulatory Visit: Payer: BC Managed Care – PPO | Attending: Physician Assistant

## 2023-07-05 DIAGNOSIS — I493 Ventricular premature depolarization: Secondary | ICD-10-CM | POA: Diagnosis not present

## 2023-07-05 DIAGNOSIS — I2510 Atherosclerotic heart disease of native coronary artery without angina pectoris: Secondary | ICD-10-CM

## 2023-07-05 HISTORY — PX: TRANSTHORACIC ECHOCARDIOGRAM: SHX275

## 2023-07-06 LAB — ECHOCARDIOGRAM LIMITED
Area-P 1/2: 5.27 cm2
S' Lateral: 4.3 cm

## 2023-07-07 ENCOUNTER — Telehealth: Payer: Self-pay

## 2023-07-07 NOTE — Progress Notes (Signed)
Lvm for pt to to call office back in regards to lab results

## 2023-07-07 NOTE — Telephone Encounter (Signed)
Lvm for pt to to call office back in regards to lab results

## 2023-07-07 NOTE — Telephone Encounter (Signed)
-----   Message from Rennie Plowman sent at 07/06/2023  1:25 PM EDT ----- Call patient Patient has not viewed MyChart result note.  Please review my chart note in detail with patient.   Please let me know if questions

## 2023-07-08 ENCOUNTER — Other Ambulatory Visit: Payer: Self-pay

## 2023-07-11 ENCOUNTER — Ambulatory Visit: Payer: BC Managed Care – PPO | Attending: Physician Assistant

## 2023-07-11 ENCOUNTER — Other Ambulatory Visit: Payer: Self-pay | Admitting: Emergency Medicine

## 2023-07-11 DIAGNOSIS — R931 Abnormal findings on diagnostic imaging of heart and coronary circulation: Secondary | ICD-10-CM

## 2023-07-11 DIAGNOSIS — I493 Ventricular premature depolarization: Secondary | ICD-10-CM

## 2023-07-14 ENCOUNTER — Other Ambulatory Visit: Payer: Self-pay

## 2023-07-30 DIAGNOSIS — R931 Abnormal findings on diagnostic imaging of heart and coronary circulation: Secondary | ICD-10-CM

## 2023-07-30 DIAGNOSIS — I493 Ventricular premature depolarization: Secondary | ICD-10-CM

## 2023-08-11 ENCOUNTER — Other Ambulatory Visit: Payer: Self-pay

## 2023-08-25 ENCOUNTER — Ambulatory Visit: Payer: BC Managed Care – PPO | Admitting: Cardiology

## 2023-08-25 NOTE — Progress Notes (Deleted)
Cardiology Office Note:  .   Date:  08/25/2023  ID:  [name], DOB [birth year], MRN [id number] PCP: Allegra Grana, FNP  [facility] Providers Cardiologist:  Bryan Lemma, MD { Click to update primary MD,subspecialty MD or APP then REFRESH:1}    No chief complaint on file.   Patient Profile: .     Shane [name] is a *** 52 y.o. male *** with a PMH notable for *** who presents here for follow-up of his CAD, ICM, and frequent PVCs. He is here at the request of Allegra Grana, FNP.  PMH: CAD with inferior ST elevation MI in 08/2022 status post thrombectomy with PCI and 2 overlapping DES,  HFrEF secondary to ICM,  frequent PVCs,  HTN, HLD,  ADD, anxiety, and  prior tobacco use     KRISTOPH MAHESHWARI was last seen on ***  Subjective  Discussed the use of AI scribe software for clinical note transcription with the patient, who gave verbal consent to proceed.  History of Present Illness            Cardiovascular ROS: {roscv:310661}  ROS:  Review of Systems - {ros master:310782}    Objective    Studies Reviewed: Marland Kitchen        ECHO 07/05/2023:  1. Left ventricular ejection fraction, by estimation, is 40 to 45% . LVEF by 3D volume is 35 % . The left ventricle has mildly decreased function. The left ventricle demonstrates global hypokinesis. Left ventricular diastolic parameters are consistent with Grade I diastolic dysfunction ( impaired relaxation) . The average left ventricular global longitudinal strain is - 13. 8 % . The global longitudinal strain is abnormal.  2. Right ventricular systolic function is normal. The right ventricular size is normal.  3. The mitral valve is normal in structure. Mild mitral valve regurgitation. No evidence of mitral stenosis.  4. The aortic valve is normal in structure. Aortic valve regurgitation is mild. No aortic stenosis is present.  5. The inferior vena cava is normal in size with greater than 50% respiratory  variability, suggesting right atrial pressure of 3 mmHg. 6. Frequent PVCs  Zio Monitor 04/2023: Patient had a min HR of 54 bpm, max HR of 231 bpm, and avg HR of 92 bpm. Predominant underlying rhythm was Sinus Rhythm. 2 Ventricular Tachycardia runs occurred, the run with the fastest interval lasting 4 beats with a max rate of 184 bpm, the longest lasting 8 beats with an avg rate of 140 bpm. 1 run of Supraventricular Tachycardia occurred lasting 15 beats with a max rate of 231 bpm ( avg 209 bpm) . Isolated SVEs were rare ( < 1. 0% ) , SVE Couplets were rare ( < 1. 0% ) , and no SVE Triplets were present. Isolated VEs were frequent ( 20. 9% , 440102) , VE Couplets were rare ( < 1. 0% , 3888) , and VE Triplets were rare ( < 1. 0% , 552) . Ventricular Bigeminy and Trigeminy were present.  Zio Monitor 08/2023: Patient had a min HR of 54 bpm, max HR of 140 bpm, and avg HR of 90 bpm. Predominant underlying rhythm was Sinus Rhythm. Slight P wave morphology changes were noted. Isolated SVEs were rare ( < 1. 0% ) , and no SVE Couplets or SVE Triplets were present. Isolated VEs were frequent ( 21. 4% , 181245) , VE Couplets were occasional ( 2. 2% , 9155) , and VE Triplets were rare ( < 1. 0% ,  269) . Ventricular Bigeminy and Trigeminy were present. No arrhythmias   CT: *** CATH: ***  Risk Assessment/Calculations:   {Does this patient have ATRIAL FIBRILLATION?:[id number]} No BP recorded.  {Refresh Note OR Click here to enter BP  :1}***         Physical Exam:   VS:  There were no vitals taken for this visit.   Wt Readings from Last 3 Encounters:  06/27/23 202 lb (91.6 kg)  05/04/23 206 lb 9.6 oz (93.7 kg)  03/22/23 196 lb 6.4 oz (89.1 kg)    GEN: Well nourished, well developed in no acute distress; *** NECK: No JVD; No carotid bruits CARDIAC: Normal S1, S2; RRR, no murmurs, rubs, gallops RESPIRATORY:  Clear to auscultation without rales, wheezing or rhonchi ; nonlabored, good air movement. ABDOMEN: Soft,  non-tender, non-distended EXTREMITIES:  No edema; No deformity      ASSESSMENT AND PLAN: .    Problem List Items Addressed This Visit   None    Assessment and Plan                 {Are you ordering a CV Procedure (e.g. stress test, cath, DCCV, TEE, etc)?   Press F2        :[id number]}   Follow-Up: No follow-ups on file.  Total time spent: *** min spent with patient + *** min spent charting = *** min      Signed, Marykay Lex, MD, MS Bryan Lemma, M.D., M.S. Interventional Cardiologist  [facility]  Pager # [phone] Phone # [phone] [address] [place], [place] [address]

## 2023-08-30 ENCOUNTER — Other Ambulatory Visit: Payer: Self-pay

## 2023-09-04 HISTORY — PX: OTHER SURGICAL HISTORY: SHX169

## 2023-09-07 NOTE — H&P (View-Only) (Signed)
 Cardiology Office Note    Date:  09/08/2023   ID:  Shane [name], DOB 1971-06-07, MRN [id number]  PCP:  Allegra Grana, FNP  Cardiologist:  Bryan Lemma, MD  Electrophysiologist:  None   Chief Complaint: Follow up  History of Present Illness:   Shane [name] is a 52 y.o. male with history of CAD with inferior ST elevation MI in 08/2022 status post thrombectomy with PCI and 2 overlapping DES, HFrEF secondary to ICM, frequent PVCs, HTN, HLD, ADD, anxiety, and prior tobacco use who presents for follow-up of his CAD, ICM, and frequent PVCs.   He was admitted in 08/2022 with inferior ST elevation MI with suspected RV infarct.  LHC showed thrombotic occlusion of the RCA with large thrombus burden.  This was treated successfully with aspiration thrombectomy and 2 overlapping drug-eluting stents.  There was no other obstructive disease.  Echo at that time showed an EF of 35 to 40%, global hypokinesis, grade 1 diastolic dysfunction, mildly reduced RV systolic function with normal ventricular cavity size, normal PASP, aortic sclerosis without evidence of stenosis.  Relative hypotension has precluded escalation of GDMT.  Most recent echo from 01/2023 demonstrated an EF of 40 to 45%, global hypokinesis, mildly dilated internal LV cavity size, grade 1 diastolic dysfunction, normal RV systolic function and ventricular cavity size, and mild mitral regurgitation.  He was seen in the office in 02/2023 for follow-up of his echo and was without symptoms of angina or cardiac decompensation.  He did feel like his stamina was not where he used to be.  He also reported a couple episodes of palpitations.  EKG at that time was notable for sinus rhythm with frequent PVCs.  Toprol was titrated to 100 mg daily (on day 4 of Zio patch) and losartan 25 mg daily was added.  Subsequent Zio patch showed a predominant rhythm of sinus with an average rate of 92 bpm (range 54 to 231 bpm), 2 episodes of NSVT occurred with  the fastest interval lasting 4 beats with a maximum rate of 184 bpm and the longest interval lasting 8 beats with an average rate of 140 bpm, 1 run of SVT occurred lasting 15 beats with a maximum rate of 231 bpm (average 209 bpm), rare PACs and atrial couplets, frequent PVCs with an overall burden of 20.9%, and rare ventricular couplets/triplets.  Ventricular bigeminy and trigeminy were present.  Upon reviewing ventricular ectopy burden, after he titrated from Toprol-XL to 100 mg daily on day 4 of his Zio patch, his ectopy burden was significantly improved, decreasing by approximately 50%.  He was seen in the office on 03/15/2023 and remained without symptoms of angina or cardiac decompensation.  Following addition of losartan and titration of Toprol he did note some positional dizziness that had subsequently improved.  He felt like palpitation burden had improved somewhat as well.  Toprol was further titrated with addition to 50 mg in the evening with continuation of 100 mg in the morning.  He was last seen in the office in 04/2023 and remained without symptoms of angina or cardiac decompensation.  He denied any further palpitations following titration of metoprolol.  EKG at that time showed no PVCs.  Limited echo in 07/2023 showed a persistent cardiomyopathy with an EF of 40 to 45%, global hypokinesis, grade 1 diastolic dysfunction, normal RV systolic function and ventricular cavity size, mild mitral regurgitation, and an estimated right atrial pressure of 3 mmHg.  Frequent PVCs were noted during the study.  In this setting he underwent repeat outpatient cardiac monitoring that showed a predominant rhythm of sinus with an average rate of 90 bpm (range 54 to 140 bpm), frequent PVCs with an overall burden of 21.4%, occasional ventricular couplets, and rare ventricular triplets.  Ventricular bigeminy and trigeminy were present, lasting up to 55 and 35 minutes respectively.  He comes in accompanied by his mom at today  and is doing well from a cardiac perspective.  No symptoms of angina or cardiac decompensation.  No dyspnea, palpitations, presyncope, or syncope.  He does note an occasional dizziness if he stands up quickly.  No lower extremity swelling or progressive orthopnea.  No hematochezia or melena.  He was taking ticagrelor only once a day over the fall months, though was not taking this twice a day.  He does frequently forget to take his second dose of metoprolol.  His weight is down 7 pounds when compared to his visit in 04/2023.   Labs independently reviewed: 02/2023 - potassium 3.8, BUN 12, serum creatinine 0.98, albumin 4.2, AST 44, ALT 53, TC 135, TG 65, HDL 57, LDL 65 09/2022 - LP(a) 9.5, Hgb 15.0, PLT 279 07/2022 - A1c 5.8 09/2021 - TSH normal  Past Medical History:  Diagnosis Date   Acute ST elevation myocardial infarction (STEMI) of inferolateral wall (HCC) 09/02/2022   Extensive thrombus in the RCA-thrombectomy with overlapping 3.5 mm stents (38 and 30 mm)   ADD (attention deficit disorder)    Alcohol abuse    History of.   Anxiety    Chronic HFrEF (heart failure with reduced ejection fraction) (HCC)    a. 09/2022 Echo: EF 35-40%, glob HK, GrI DD, mildly reduced RV fxn, RVSP 31.70mmHg, no significant valvular disease.   Coronary artery disease involving native coronary artery of native heart 08/2022   a. 08/2022 Inflat STEMI/PCI: LM nl, LAD nl, D1/2 nl, RI nl, LCX nl, OM1/2 nl, RCA 100p/m (thrombectomy & 3.5x38 & 3.5x30 overlapping Onyx Frontier DESs), 45m/d (thombectomy and PTCA into RPAV branch), RPDA 100, RPL1 100, EF 45-50%.   Depression    Frequent PVCs 02/17/2023   Hyperlipidemia    Hypertension    Ischemic cardiomyopathy    a. 09/2022 Echo: EF 35-40%.   Ischemic cardiomyopathy 02/17/2023   09/2022 (inferior STEMI) echo: EF 35-40%, glob HK, GrI DD, mildly reduced RV fxn, RVSP 31.93mmHg, no significant valvular disease.  TTE follow-up 01/06/2023: EF remains 40 to 45% with mild to  moderate dysfunction       Past Surgical History:  Procedure Laterality Date   CORONARY/GRAFT ACUTE MI REVASCULARIZATION N/A 09/02/2022   Procedure: Coronary/Graft Acute MI Revascularization;  Surgeon: Marykay Lex, MD;  Location: [facility] INVASIVE CV LAB;  Service: Cardiovascular;  Laterality: N/A;   LEFT HEART CATH AND CORS/GRAFTS ANGIOGRAPHY N/A 09/02/2022   Procedure: LEFT HEART CATH AND CORS/GRAFTS ANGIOGRAPHY;  Surgeon: Marykay Lex, MD;  Location: [facility] INVASIVE CV LAB;  Service: Cardiovascular;  Laterality: N/A;   TENDON REPAIR Right 09/15/2015   Procedure: RIGHT FOREARM EXPLORATION AND CLOSURE OF LACERATION, TENDON REPAIR;  Surgeon: Bradly Bienenstock, MD;  Location: [facility] OR;  Service: Orthopedics;  Laterality: Right;    Current Medications: Current Meds  Medication Sig   amphetamine-dextroamphetamine (ADDERALL) 20 MG tablet Take 1 tablet (20 mg total) by mouth 2 (two) times daily.   amphetamine-dextroamphetamine (ADDERALL) 20 MG tablet Take 1 tablet (20 mg total) by mouth 2 (two) times daily.   amphetamine-dextroamphetamine (ADDERALL) 20 MG tablet Take 1 tablet (20 mg total)  by mouth 2 (two) times daily.   aspirin 81 MG chewable tablet Chew 1 tablet (81 mg total) by mouth daily.   dapagliflozin propanediol (FARXIGA) 10 MG TABS tablet Take 1 tablet (10 mg total) by mouth daily.   losartan (COZAAR) 25 MG tablet Take 0.5 tablets (12.5 mg total) by mouth daily for 7 days, THEN 1 tablet (25 mg total) daily.   mirtazapine (REMERON) 15 MG tablet Take 1 tablet (15 mg total) by mouth at bedtime.   rosuvastatin (CRESTOR) 40 MG tablet Take 1 tablet (40 mg total) by mouth daily.   sertraline (ZOLOFT) 100 MG tablet Take 1 tablet (100 mg total) by mouth daily.   ticagrelor (BRILINTA) 90 MG TABS tablet Take 1 tablet (90 mg total) by mouth 2 (two) times daily.   [DISCONTINUED] metoprolol succinate (TOPROL XL) 100 MG 24 hr tablet Take 0.5-1 tablets (50-100 mg total) by mouth daily. Take 100 mg in the  morning and 50 mg at bedtime    Allergies:   Patient has no known allergies.   Social History   Socioeconomic History   Marital status: Divorced    Spouse name: Barak Friar   Number of children: 2   Years of education: 14   Highest education level: Not on file  Occupational History    Comment: [employer]  Tobacco Use   Smoking status: Some Days    Current packs/day: 0.00    Average packs/day: 0.3 packs/day for 20.0 years (5.0 ttl pk-yrs)    Types: Cigarettes    Start date: 09/02/2002    Last attempt to quit: 09/02/2022    Years since quitting: 1.0   Smokeless tobacco: Never   Tobacco comments:    1/2 PPW; 02/17/2023  Vaping Use   Vaping status: Never Used  Substance and Sexual Activity   Alcohol use: No    Comment: no alcohol since 12/16/2012   Drug use: No   Sexual activity: Yes    Partners: Female  Other Topics Concern   Not on file  Social History Narrative   Divorced in 08/2020; they have  two daughters (22 and 13 )    In the past-Rebuilds induction coils for work   05/2022- started [employer]   Social Determinants of Health   Financial Resource Strain: Not on file  Food Insecurity: Not on file  Transportation Needs: Not on file  Physical Activity: Not on file  Stress: Not on file  Social Connections: Not on file     Family History:  The patient's family history includes Alcohol abuse in his maternal aunt and paternal grandfather; Heart attack (age of onset: 9) in his paternal uncle; Heart attack (age of onset: 20) in his maternal grandfather; Heart attack (age of onset: 6) in his maternal great-grandfather; Hyperlipidemia in his father and another family member; Hypertension in his cousin and father; Lung cancer in his maternal grandfather; Prostate cancer in his father.  ROS:   12-point review of systems is negative unless otherwise noted in the HPI.   EKGs/Labs/Other Studies Reviewed:    Studies reviewed were summarized above. The  additional studies were reviewed today:  Zio patch 07/2023: Patient had a min HR of 54 bpm, max HR of 140 bpm, and avg HR of 90 bpm. Predominant underlying rhythm was Sinus Rhythm. sSlight P wave morphology changes were noted.Isolated SVEs were rare (<1.0%), and no SVE Couplets or SVE Triplets were present.  Isolated VEs were frequent (21.4%, 181245), VE Couplets were occasional (2.2%, 9155),  and VE Triplets were rare (<1.0%, 269). Ventricular Bigeminy and Trigeminy were present. __________  Limited echo 07/05/2023: 1. Left ventricular ejection fraction, by estimation, is 40 to 45%. Left  ventricular ejection fraction by 3D volume is 35 %. The left ventricle has  mildly decreased function. The left ventricle demonstrates global  hypokinesis. Left ventricular diastolic   parameters are consistent with Grade I diastolic dysfunction (impaired  relaxation). The average left ventricular global longitudinal strain is  -13.8 %. The global longitudinal strain is abnormal.   2. Right ventricular systolic function is normal. The right ventricular  size is normal.   3. The mitral valve is normal in structure. Mild mitral valve  regurgitation. No evidence of mitral stenosis.   4. The aortic valve is normal in structure. Aortic valve regurgitation is  mild. No aortic stenosis is present.   5. The inferior vena cava is normal in size with greater than 50%  respiratory variability, suggesting right atrial pressure of 3 mmHg.   6. Frequent PVCs  __________  Zio patch 02/2023:   ~9-day Zio patch monitor (May 2024)   Predominant Underlying Rhythm-Sinus: HR range 54-139 bpm, Avg 89 bpm.   Frequent PVCs ~21% with rare couplets and triplets, but up to 20 minutes of bigeminy/trigeminy noted.  Symptoms noted with sinus rhythm and either isolated PVCs or PVCs and bigeminy.   Rare PACs.   2 short bursts of PVCs (Nonsustained Ventricular Tachycardia) -> fastest was 4 beats (1.7 seconds) HR 110-184 bpm, Avg 151  bpm. => Goes from bigeminy to a fusion beat bigeminy followed by another physician..  Not true V. tach.   1 atrial run of 15 beats - HR range 181 to 231 bpm, Avg 209 bpm (4.7 sec)   No Sustained Arrhythmias: Atrial Tachycardia (AT), Supraventricular Tachycardia (SVT), Atrial Fibrillation (A-Fib), Atrial Flutter (A-Flutter), Sustained Ventricular Tachycardia (VT) __________   2D echo 01/06/2023: 1. Left ventricular ejection fraction, by estimation, is 40 to 45%. The  left ventricle has mild to moderately decreased function. The left  ventricle demonstrates global hypokinesis. The left ventricular internal  cavity size was mildly dilated. Left  ventricular diastolic parameters are consistent with Grade I diastolic  dysfunction (impaired relaxation). The average left ventricular global  longitudinal strain is -13.9 %. The global longitudinal strain is  abnormal.   2. Right ventricular systolic function is normal. The right ventricular  size is normal.   3. The mitral valve is normal in structure. Mild mitral valve  regurgitation.   4. The aortic valve is tricuspid. Aortic valve regurgitation is mild.   5. The inferior vena cava is dilated in size with <50% respiratory  variability, suggesting right atrial pressure of 15 mmHg.  __________   2D echo 09/03/2022: 1. Left ventricular ejection fraction, by estimation, is 35 to 40%. The  left ventricle has moderately decreased function. The left ventricle  demonstrates global hypokinesis. Left ventricular diastolic parameters are  consistent with Grade I diastolic  dysfunction (impaired relaxation).   2. Right ventricular systolic function is mildly reduced. The right  ventricular size is normal. There is normal pulmonary artery systolic  pressure. The estimated right ventricular systolic pressure is 31.8 mmHg.   3. The mitral valve is normal in structure. No evidence of mitral valve  regurgitation. No evidence of mitral stenosis.   4. The  aortic valve is normal in structure. Aortic valve regurgitation is  not visualized. Aortic valve sclerosis is present, with no evidence of  aortic valve stenosis.  5. The inferior vena cava is dilated in size with <50% respiratory  variability, suggesting right atrial pressure of 15 mmHg.  __________   LHC 09/02/2022:   Prox RCA to Mid RCA lesion is 100% stenosed.   Balloon angioplasty was performed using a BALLN TREK RX 3.0X20.   Aspiration thrombectomy performed using Penumbra catheter   A drug-eluting stent was successfully placed using a STENT ONYX FRONTIER 3.5X38.  Postdilated to 3.6 mm   A 2nd drug-eluting stent was successfully placed overlapping the previous stent proximally to cover the entire lesion, using a STENT ONYX FRONTIER 3.5X30.  Postdilated to 3.65 mm   Post intervention, there is a 0% residual stenosis for both stents.   -------------------------   Mid RCA to Dist RCA lesion is 95% stenosed with 95% stenosed side branch in RPAV.   Balloon angioplasty was performed using a BALLN TREK RX 3.0X20 -with minimal benefit.   Aspiration thrombectomy performed using Penumbra catheter from the distal RCA although into the PL branch restoring TIMI-3 flow.   Post intervention, there is a 0% residual stenosis. Post intervention, the side branch was reduced to 0% residual stenosis.   1st RPL lesion is 100% stenosed.  RPDA lesion is 100% stenosed -slow flow to the apex because of distal embolization.   -------------------------   Normal Left Coronary System   There is mild to moderate left ventricular systolic dysfunction.  The left ventricular ejection fraction is 45-50% by visual estimate -> basal to mid inferior wall severe hypo to akinesis   LV end diastolic pressure is moderately elevated.-24 mmHg   There is no aortic valve stenosis.   POST-CATH DIAGNOSES Acute Inferolateral STEMI-suspect possible RV involvement with mildly reduced EF and basal to mid inferior akinesis to severe  hypokinesis.  EF estimated roughly 45% CULPRIT Lesion is 100% near still RCA heavily thrombotic occlusion with extensive thrombus throughout the entire RCA requiring Penumbra thrombectomy to both the proximal and distal portion of the vessel finally restoring TIMI-3 flow to all but the very apical portions of the RPL and PDA. =>  The proximal to mid will disease vessel had received treated with 2 overlapping Onyx Frontier DES Stents (3.5 mm 38 mm and 3.5 mm x 30 mm with roughly 2 to 3 mm overlap.  Stents were post-dilated to 3.65 mm) => TIMI 0 flow reduced to TIMI-3 flow with 0% residual stenosis   Angiographically normal Left Coronary System with a non--dominant Cx and a Large Ramus Intermedius that is the same size as it is a wraparound LAD.  Temporary hypotension not unexpected with RV infarct, responded well to 2 500 mL boluses and short run of Levophed that was reduced and weaned off prior to leaving the Cath Lab.  Blood pressures were in the 110/80 range.     RECOMMENDATIONS Admit to ICU-we will continue Aggrastat for 12 hours post PCI Check 2D echo in the morning-(already ordered) Cycle troponins with troponin level in the morning. We will start low-dose metoprolol 12.5 mg twice daily-titrate if possible. High-dose statin-check lipid panel in the morning. Smoke cessation counseling-he says that he will quit smoking.   EKG:  EKG is ordered today.  The EKG ordered today demonstrates NSR, 72 bpm, no PVCs, prior inferior MI with nonspecific inferior ST-T changes consistent with prior tracing  Recent Labs: 02/17/2023: ALT 53; BUN 12; Creatinine, Ser 0.98; Potassium 3.8; Sodium 137  Recent Lipid Panel    Component Value Date/Time   CHOL 135 02/17/2023 1140   TRIG 65  02/17/2023 1140   HDL 57 02/17/2023 1140   CHOLHDL 2.4 02/17/2023 1140   VLDL 13 02/17/2023 1140   LDLCALC 65 02/17/2023 1140    PHYSICAL EXAM:    VS:  BP (!) 130/91 (BP Location: Left Arm, Patient Position: Sitting,  Cuff Size: Normal)   Pulse 72   Ht 6\' 2"  (1.88 m)   Wt 199 lb 6.4 oz (90.4 kg)   SpO2 97%   BMI 25.60 kg/m   BMI: Body mass index is 25.6 kg/m.  Physical Exam Vitals reviewed.  Constitutional:      Appearance: He is well-developed.  HENT:     Head: Normocephalic and atraumatic.  Eyes:     General:        Right eye: No discharge.        Left eye: No discharge.  Neck:     Vascular: No JVD.  Cardiovascular:     Rate and Rhythm: Normal rate and regular rhythm.     Pulses:          Posterior tibial pulses are 2+ on the right side and 2+ on the left side.     Heart sounds: Normal heart sounds, S1 normal and S2 normal. Heart sounds not distant. No midsystolic click and no opening snap. No murmur heard.    No friction rub.  Pulmonary:     Effort: Pulmonary effort is normal. No respiratory distress.     Breath sounds: Normal breath sounds. No decreased breath sounds, wheezing or rales.  Chest:     Chest wall: No tenderness.  Abdominal:     General: There is no distension.  Musculoskeletal:     Cervical back: Normal range of motion.     Right lower leg: No edema.     Left lower leg: No edema.  Skin:    General: Skin is warm and dry.     Nails: There is no clubbing.  Neurological:     Mental Status: He is alert and oriented to person, place, and time.  Psychiatric:        Speech: Speech normal.        Behavior: Behavior normal.        Thought Content: Thought content normal.        Judgment: Judgment normal.     Wt Readings from Last 3 Encounters:  09/08/23 199 lb 6.4 oz (90.4 kg)  06/27/23 202 lb (91.6 kg)  05/04/23 206 lb 9.6 oz (93.7 kg)     ASSESSMENT & PLAN:   CAD involving the native coronary arteries without angina: He continues to do well and is without symptoms of angina or cardiac decompensation.  Continue aggressive risk factor modification and secondary prevention including aspirin and ticagrelor, though was taking Brilinta only once daily throughout the  fall months, now taking twice daily.  In the setting of persistent cardiomyopathy and frequent PVCs with sustained episodes of ventricular bigeminy and trigeminy we will pursue cardiac cath to help guide therapy moving forward.  He will otherwise continue Toprol-XL and rosuvastatin as outlined below.  Patient is followed by Dr. Herbie Baltimore, though due to patient's work schedule we will need a cardiac cath to be performed by Dr. Kirke Corin.   HFrEF secondary to mixed ICM and NICM: Euvolemic and well compensated with NYHA class I symptoms.  Updated echo showed a persistent stable cardiomyopathy with an EF of 40 to 45%.  It remains unclear at this time if his persistent cardiomyopathy is in the setting of infrequent PVCs  versus ischemic etiology.  Cannot exclude Adderall component as well.  Relative hypotension has previously precluded escalation of GDMT.  We will pursue cardiac cath as outlined above with continuation of Toprol-XL 150 mg daily, Farxiga 10 mg, and losartan 25 mg daily.  Based on cardiac cath results would escalate GDMT as able with transition of ARB to ARNI along with addition of MRA.  Not requiring a standing loop diuretic.  Pending cardiac cath may need to pursue cardiac MRI.  Frequent PVCs/NSVT: No ventricular ectopy noted on EKG today.  However, repeat outpatient again showed frequent PVC burden of up to 21.4% along with episode of ventricular bigeminy lasting 55 minutes and trigeminy lasting 35 minutes respectively.  He has been missing his second dose of Toprol-XL, and in this setting we will consolidate Toprol-XL to 150 mg daily.  It remains uncertain at this time if his ventricular ectopy is contributing to his persistent cardiomyopathy, or if his ventricular ectopy is in the setting of ischemic heart disease.  Given persistent frequent ventricular ectopy, and in the context of persistent cardiomyopathy, pursue diagnostic cath as outlined above.  If there is no significant ischemic heart disease  noted on cath would recommend EP evaluation with possible cardiac MRI, for ongoing management of frequent PVCs despite escalation of beta-blocker.  PSVT: Quiescent.  Remains on Toprol-XL as outlined above.  HTN: Blood pressure is well-controlled in the office today.  Historically has had relative hypotension.  In this setting, defer escalation of pharmacotherapy at this time.  Remains on Toprol-XL and losartan as outlined above.  HLD: LDL 65 in 02/2023.  Remains on rosuvastatin 40 mg.   Informed Consent   Shared Decision Making/Informed Consent{  The risks [stroke (1 in 1000), death (1 in 1000), kidney failure [usually temporary] (1 in 500), bleeding (1 in 200), allergic reaction [possibly serious] (1 in 200)], benefits (diagnostic support and management of coronary artery disease) and alternatives of a cardiac catheterization were discussed in detail with Mr. Roath and he is willing to proceed.        Disposition: F/u with Dr. Herbie Baltimore or an APP 1-2 weeks after [facility].   Medication Adjustments/Labs and Tests Ordered: Current medicines are reviewed at length with the patient today.  Concerns regarding medicines are outlined above. Medication changes, Labs and Tests ordered today are summarized above and listed in the Patient Instructions accessible in Encounters.   Signed, Eula Listen, PA-C 09/08/2023 12:58 PM     [facility] [place] [facility] [address] [name], [place] [address] [phone]

## 2023-09-07 NOTE — Progress Notes (Unsigned)
Cardiology Office Note    Date:  09/08/2023   ID:  Shane [name], DOB 1971-06-07, MRN [id number]  PCP:  Allegra Grana, FNP  Cardiologist:  Bryan Lemma, MD  Electrophysiologist:  None   Chief Complaint: Follow up  History of Present Illness:   Shane [name] is a 52 y.o. male with history of CAD with inferior ST elevation MI in 08/2022 status post thrombectomy with PCI and 2 overlapping DES, HFrEF secondary to ICM, frequent PVCs, HTN, HLD, ADD, anxiety, and prior tobacco use who presents for follow-up of his CAD, ICM, and frequent PVCs.   He was admitted in 08/2022 with inferior ST elevation MI with suspected RV infarct.  LHC showed thrombotic occlusion of the RCA with large thrombus burden.  This was treated successfully with aspiration thrombectomy and 2 overlapping drug-eluting stents.  There was no other obstructive disease.  Echo at that time showed an EF of 35 to 40%, global hypokinesis, grade 1 diastolic dysfunction, mildly reduced RV systolic function with normal ventricular cavity size, normal PASP, aortic sclerosis without evidence of stenosis.  Relative hypotension has precluded escalation of GDMT.  Most recent echo from 01/2023 demonstrated an EF of 40 to 45%, global hypokinesis, mildly dilated internal LV cavity size, grade 1 diastolic dysfunction, normal RV systolic function and ventricular cavity size, and mild mitral regurgitation.  He was seen in the office in 02/2023 for follow-up of his echo and was without symptoms of angina or cardiac decompensation.  He did feel like his stamina was not where he used to be.  He also reported a couple episodes of palpitations.  EKG at that time was notable for sinus rhythm with frequent PVCs.  Toprol was titrated to 100 mg daily (on day 4 of Zio patch) and losartan 25 mg daily was added.  Subsequent Zio patch showed a predominant rhythm of sinus with an average rate of 92 bpm (range 54 to 231 bpm), 2 episodes of NSVT occurred with  the fastest interval lasting 4 beats with a maximum rate of 184 bpm and the longest interval lasting 8 beats with an average rate of 140 bpm, 1 run of SVT occurred lasting 15 beats with a maximum rate of 231 bpm (average 209 bpm), rare PACs and atrial couplets, frequent PVCs with an overall burden of 20.9%, and rare ventricular couplets/triplets.  Ventricular bigeminy and trigeminy were present.  Upon reviewing ventricular ectopy burden, after he titrated from Toprol-XL to 100 mg daily on day 4 of his Zio patch, his ectopy burden was significantly improved, decreasing by approximately 50%.  He was seen in the office on 03/15/2023 and remained without symptoms of angina or cardiac decompensation.  Following addition of losartan and titration of Toprol he did note some positional dizziness that had subsequently improved.  He felt like palpitation burden had improved somewhat as well.  Toprol was further titrated with addition to 50 mg in the evening with continuation of 100 mg in the morning.  He was last seen in the office in 04/2023 and remained without symptoms of angina or cardiac decompensation.  He denied any further palpitations following titration of metoprolol.  EKG at that time showed no PVCs.  Limited echo in 07/2023 showed a persistent cardiomyopathy with an EF of 40 to 45%, global hypokinesis, grade 1 diastolic dysfunction, normal RV systolic function and ventricular cavity size, mild mitral regurgitation, and an estimated right atrial pressure of 3 mmHg.  Frequent PVCs were noted during the study.  In this setting he underwent repeat outpatient cardiac monitoring that showed a predominant rhythm of sinus with an average rate of 90 bpm (range 54 to 140 bpm), frequent PVCs with an overall burden of 21.4%, occasional ventricular couplets, and rare ventricular triplets.  Ventricular bigeminy and trigeminy were present, lasting up to 55 and 35 minutes respectively.  He comes in accompanied by his mom at today  and is doing well from a cardiac perspective.  No symptoms of angina or cardiac decompensation.  No dyspnea, palpitations, presyncope, or syncope.  He does note an occasional dizziness if he stands up quickly.  No lower extremity swelling or progressive orthopnea.  No hematochezia or melena.  He was taking ticagrelor only once a day over the fall months, though was not taking this twice a day.  He does frequently forget to take his second dose of metoprolol.  His weight is down 7 pounds when compared to his visit in 04/2023.   Labs independently reviewed: 02/2023 - potassium 3.8, BUN 12, serum creatinine 0.98, albumin 4.2, AST 44, ALT 53, TC 135, TG 65, HDL 57, LDL 65 09/2022 - LP(a) 9.5, Hgb 15.0, PLT 279 07/2022 - A1c 5.8 09/2021 - TSH normal  Past Medical History:  Diagnosis Date   Acute ST elevation myocardial infarction (STEMI) of inferolateral wall (HCC) 09/02/2022   Extensive thrombus in the RCA-thrombectomy with overlapping 3.5 mm stents (38 and 30 mm)   ADD (attention deficit disorder)    Alcohol abuse    History of.   Anxiety    Chronic HFrEF (heart failure with reduced ejection fraction) (HCC)    a. 09/2022 Echo: EF 35-40%, glob HK, GrI DD, mildly reduced RV fxn, RVSP 31.70mmHg, no significant valvular disease.   Coronary artery disease involving native coronary artery of native heart 08/2022   a. 08/2022 Inflat STEMI/PCI: LM nl, LAD nl, D1/2 nl, RI nl, LCX nl, OM1/2 nl, RCA 100p/m (thrombectomy & 3.5x38 & 3.5x30 overlapping Onyx Frontier DESs), 45m/d (thombectomy and PTCA into RPAV branch), RPDA 100, RPL1 100, EF 45-50%.   Depression    Frequent PVCs 02/17/2023   Hyperlipidemia    Hypertension    Ischemic cardiomyopathy    a. 09/2022 Echo: EF 35-40%.   Ischemic cardiomyopathy 02/17/2023   09/2022 (inferior STEMI) echo: EF 35-40%, glob HK, GrI DD, mildly reduced RV fxn, RVSP 31.93mmHg, no significant valvular disease.  TTE follow-up 01/06/2023: EF remains 40 to 45% with mild to  moderate dysfunction       Past Surgical History:  Procedure Laterality Date   CORONARY/GRAFT ACUTE MI REVASCULARIZATION N/A 09/02/2022   Procedure: Coronary/Graft Acute MI Revascularization;  Surgeon: Marykay Lex, MD;  Location: [facility] INVASIVE CV LAB;  Service: Cardiovascular;  Laterality: N/A;   LEFT HEART CATH AND CORS/GRAFTS ANGIOGRAPHY N/A 09/02/2022   Procedure: LEFT HEART CATH AND CORS/GRAFTS ANGIOGRAPHY;  Surgeon: Marykay Lex, MD;  Location: [facility] INVASIVE CV LAB;  Service: Cardiovascular;  Laterality: N/A;   TENDON REPAIR Right 09/15/2015   Procedure: RIGHT FOREARM EXPLORATION AND CLOSURE OF LACERATION, TENDON REPAIR;  Surgeon: Bradly Bienenstock, MD;  Location: [facility] OR;  Service: Orthopedics;  Laterality: Right;    Current Medications: Current Meds  Medication Sig   amphetamine-dextroamphetamine (ADDERALL) 20 MG tablet Take 1 tablet (20 mg total) by mouth 2 (two) times daily.   amphetamine-dextroamphetamine (ADDERALL) 20 MG tablet Take 1 tablet (20 mg total) by mouth 2 (two) times daily.   amphetamine-dextroamphetamine (ADDERALL) 20 MG tablet Take 1 tablet (20 mg total)  by mouth 2 (two) times daily.   aspirin 81 MG chewable tablet Chew 1 tablet (81 mg total) by mouth daily.   dapagliflozin propanediol (FARXIGA) 10 MG TABS tablet Take 1 tablet (10 mg total) by mouth daily.   losartan (COZAAR) 25 MG tablet Take 0.5 tablets (12.5 mg total) by mouth daily for 7 days, THEN 1 tablet (25 mg total) daily.   mirtazapine (REMERON) 15 MG tablet Take 1 tablet (15 mg total) by mouth at bedtime.   rosuvastatin (CRESTOR) 40 MG tablet Take 1 tablet (40 mg total) by mouth daily.   sertraline (ZOLOFT) 100 MG tablet Take 1 tablet (100 mg total) by mouth daily.   ticagrelor (BRILINTA) 90 MG TABS tablet Take 1 tablet (90 mg total) by mouth 2 (two) times daily.   [DISCONTINUED] metoprolol succinate (TOPROL XL) 100 MG 24 hr tablet Take 0.5-1 tablets (50-100 mg total) by mouth daily. Take 100 mg in the  morning and 50 mg at bedtime    Allergies:   Patient has no known allergies.   Social History   Socioeconomic History   Marital status: Divorced    Spouse name: Barak Friar   Number of children: 2   Years of education: 14   Highest education level: Not on file  Occupational History    Comment: [employer]  Tobacco Use   Smoking status: Some Days    Current packs/day: 0.00    Average packs/day: 0.3 packs/day for 20.0 years (5.0 ttl pk-yrs)    Types: Cigarettes    Start date: 09/02/2002    Last attempt to quit: 09/02/2022    Years since quitting: 1.0   Smokeless tobacco: Never   Tobacco comments:    1/2 PPW; 02/17/2023  Vaping Use   Vaping status: Never Used  Substance and Sexual Activity   Alcohol use: No    Comment: no alcohol since 12/16/2012   Drug use: No   Sexual activity: Yes    Partners: Female  Other Topics Concern   Not on file  Social History Narrative   Divorced in 08/2020; they have  two daughters (22 and 13 )    In the past-Rebuilds induction coils for work   05/2022- started [employer]   Social Determinants of Health   Financial Resource Strain: Not on file  Food Insecurity: Not on file  Transportation Needs: Not on file  Physical Activity: Not on file  Stress: Not on file  Social Connections: Not on file     Family History:  The patient's family history includes Alcohol abuse in his maternal aunt and paternal grandfather; Heart attack (age of onset: 9) in his paternal uncle; Heart attack (age of onset: 20) in his maternal grandfather; Heart attack (age of onset: 6) in his maternal great-grandfather; Hyperlipidemia in his father and another family member; Hypertension in his cousin and father; Lung cancer in his maternal grandfather; Prostate cancer in his father.  ROS:   12-point review of systems is negative unless otherwise noted in the HPI.   EKGs/Labs/Other Studies Reviewed:    Studies reviewed were summarized above. The  additional studies were reviewed today:  Zio patch 07/2023: Patient had a min HR of 54 bpm, max HR of 140 bpm, and avg HR of 90 bpm. Predominant underlying rhythm was Sinus Rhythm. sSlight P wave morphology changes were noted.Isolated SVEs were rare (<1.0%), and no SVE Couplets or SVE Triplets were present.  Isolated VEs were frequent (21.4%, 181245), VE Couplets were occasional (2.2%, 9155),  and VE Triplets were rare (<1.0%, 269). Ventricular Bigeminy and Trigeminy were present. __________  Limited echo 07/05/2023: 1. Left ventricular ejection fraction, by estimation, is 40 to 45%. Left  ventricular ejection fraction by 3D volume is 35 %. The left ventricle has  mildly decreased function. The left ventricle demonstrates global  hypokinesis. Left ventricular diastolic   parameters are consistent with Grade I diastolic dysfunction (impaired  relaxation). The average left ventricular global longitudinal strain is  -13.8 %. The global longitudinal strain is abnormal.   2. Right ventricular systolic function is normal. The right ventricular  size is normal.   3. The mitral valve is normal in structure. Mild mitral valve  regurgitation. No evidence of mitral stenosis.   4. The aortic valve is normal in structure. Aortic valve regurgitation is  mild. No aortic stenosis is present.   5. The inferior vena cava is normal in size with greater than 50%  respiratory variability, suggesting right atrial pressure of 3 mmHg.   6. Frequent PVCs  __________  Zio patch 02/2023:   ~9-day Zio patch monitor (May 2024)   Predominant Underlying Rhythm-Sinus: HR range 54-139 bpm, Avg 89 bpm.   Frequent PVCs ~21% with rare couplets and triplets, but up to 20 minutes of bigeminy/trigeminy noted.  Symptoms noted with sinus rhythm and either isolated PVCs or PVCs and bigeminy.   Rare PACs.   2 short bursts of PVCs (Nonsustained Ventricular Tachycardia) -> fastest was 4 beats (1.7 seconds) HR 110-184 bpm, Avg 151  bpm. => Goes from bigeminy to a fusion beat bigeminy followed by another physician..  Not true V. tach.   1 atrial run of 15 beats - HR range 181 to 231 bpm, Avg 209 bpm (4.7 sec)   No Sustained Arrhythmias: Atrial Tachycardia (AT), Supraventricular Tachycardia (SVT), Atrial Fibrillation (A-Fib), Atrial Flutter (A-Flutter), Sustained Ventricular Tachycardia (VT) __________   2D echo 01/06/2023: 1. Left ventricular ejection fraction, by estimation, is 40 to 45%. The  left ventricle has mild to moderately decreased function. The left  ventricle demonstrates global hypokinesis. The left ventricular internal  cavity size was mildly dilated. Left  ventricular diastolic parameters are consistent with Grade I diastolic  dysfunction (impaired relaxation). The average left ventricular global  longitudinal strain is -13.9 %. The global longitudinal strain is  abnormal.   2. Right ventricular systolic function is normal. The right ventricular  size is normal.   3. The mitral valve is normal in structure. Mild mitral valve  regurgitation.   4. The aortic valve is tricuspid. Aortic valve regurgitation is mild.   5. The inferior vena cava is dilated in size with <50% respiratory  variability, suggesting right atrial pressure of 15 mmHg.  __________   2D echo 09/03/2022: 1. Left ventricular ejection fraction, by estimation, is 35 to 40%. The  left ventricle has moderately decreased function. The left ventricle  demonstrates global hypokinesis. Left ventricular diastolic parameters are  consistent with Grade I diastolic  dysfunction (impaired relaxation).   2. Right ventricular systolic function is mildly reduced. The right  ventricular size is normal. There is normal pulmonary artery systolic  pressure. The estimated right ventricular systolic pressure is 31.8 mmHg.   3. The mitral valve is normal in structure. No evidence of mitral valve  regurgitation. No evidence of mitral stenosis.   4. The  aortic valve is normal in structure. Aortic valve regurgitation is  not visualized. Aortic valve sclerosis is present, with no evidence of  aortic valve stenosis.  5. The inferior vena cava is dilated in size with <50% respiratory  variability, suggesting right atrial pressure of 15 mmHg.  __________   LHC 09/02/2022:   Prox RCA to Mid RCA lesion is 100% stenosed.   Balloon angioplasty was performed using a BALLN TREK RX 3.0X20.   Aspiration thrombectomy performed using Penumbra catheter   A drug-eluting stent was successfully placed using a STENT ONYX FRONTIER 3.5X38.  Postdilated to 3.6 mm   A 2nd drug-eluting stent was successfully placed overlapping the previous stent proximally to cover the entire lesion, using a STENT ONYX FRONTIER 3.5X30.  Postdilated to 3.65 mm   Post intervention, there is a 0% residual stenosis for both stents.   -------------------------   Mid RCA to Dist RCA lesion is 95% stenosed with 95% stenosed side branch in RPAV.   Balloon angioplasty was performed using a BALLN TREK RX 3.0X20 -with minimal benefit.   Aspiration thrombectomy performed using Penumbra catheter from the distal RCA although into the PL branch restoring TIMI-3 flow.   Post intervention, there is a 0% residual stenosis. Post intervention, the side branch was reduced to 0% residual stenosis.   1st RPL lesion is 100% stenosed.  RPDA lesion is 100% stenosed -slow flow to the apex because of distal embolization.   -------------------------   Normal Left Coronary System   There is mild to moderate left ventricular systolic dysfunction.  The left ventricular ejection fraction is 45-50% by visual estimate -> basal to mid inferior wall severe hypo to akinesis   LV end diastolic pressure is moderately elevated.-24 mmHg   There is no aortic valve stenosis.   POST-CATH DIAGNOSES Acute Inferolateral STEMI-suspect possible RV involvement with mildly reduced EF and basal to mid inferior akinesis to severe  hypokinesis.  EF estimated roughly 45% CULPRIT Lesion is 100% near still RCA heavily thrombotic occlusion with extensive thrombus throughout the entire RCA requiring Penumbra thrombectomy to both the proximal and distal portion of the vessel finally restoring TIMI-3 flow to all but the very apical portions of the RPL and PDA. =>  The proximal to mid will disease vessel had received treated with 2 overlapping Onyx Frontier DES Stents (3.5 mm 38 mm and 3.5 mm x 30 mm with roughly 2 to 3 mm overlap.  Stents were post-dilated to 3.65 mm) => TIMI 0 flow reduced to TIMI-3 flow with 0% residual stenosis   Angiographically normal Left Coronary System with a non--dominant Cx and a Large Ramus Intermedius that is the same size as it is a wraparound LAD.  Temporary hypotension not unexpected with RV infarct, responded well to 2 500 mL boluses and short run of Levophed that was reduced and weaned off prior to leaving the Cath Lab.  Blood pressures were in the 110/80 range.     RECOMMENDATIONS Admit to ICU-we will continue Aggrastat for 12 hours post PCI Check 2D echo in the morning-(already ordered) Cycle troponins with troponin level in the morning. We will start low-dose metoprolol 12.5 mg twice daily-titrate if possible. High-dose statin-check lipid panel in the morning. Smoke cessation counseling-he says that he will quit smoking.   EKG:  EKG is ordered today.  The EKG ordered today demonstrates NSR, 72 bpm, no PVCs, prior inferior MI with nonspecific inferior ST-T changes consistent with prior tracing  Recent Labs: 02/17/2023: ALT 53; BUN 12; Creatinine, Ser 0.98; Potassium 3.8; Sodium 137  Recent Lipid Panel    Component Value Date/Time   CHOL 135 02/17/2023 1140   TRIG 65  02/17/2023 1140   HDL 57 02/17/2023 1140   CHOLHDL 2.4 02/17/2023 1140   VLDL 13 02/17/2023 1140   LDLCALC 65 02/17/2023 1140    PHYSICAL EXAM:    VS:  BP (!) 130/91 (BP Location: Left Arm, Patient Position: Sitting,  Cuff Size: Normal)   Pulse 72   Ht 6\' 2"  (1.88 m)   Wt 199 lb 6.4 oz (90.4 kg)   SpO2 97%   BMI 25.60 kg/m   BMI: Body mass index is 25.6 kg/m.  Physical Exam Vitals reviewed.  Constitutional:      Appearance: He is well-developed.  HENT:     Head: Normocephalic and atraumatic.  Eyes:     General:        Right eye: No discharge.        Left eye: No discharge.  Neck:     Vascular: No JVD.  Cardiovascular:     Rate and Rhythm: Normal rate and regular rhythm.     Pulses:          Posterior tibial pulses are 2+ on the right side and 2+ on the left side.     Heart sounds: Normal heart sounds, S1 normal and S2 normal. Heart sounds not distant. No midsystolic click and no opening snap. No murmur heard.    No friction rub.  Pulmonary:     Effort: Pulmonary effort is normal. No respiratory distress.     Breath sounds: Normal breath sounds. No decreased breath sounds, wheezing or rales.  Chest:     Chest wall: No tenderness.  Abdominal:     General: There is no distension.  Musculoskeletal:     Cervical back: Normal range of motion.     Right lower leg: No edema.     Left lower leg: No edema.  Skin:    General: Skin is warm and dry.     Nails: There is no clubbing.  Neurological:     Mental Status: He is alert and oriented to person, place, and time.  Psychiatric:        Speech: Speech normal.        Behavior: Behavior normal.        Thought Content: Thought content normal.        Judgment: Judgment normal.     Wt Readings from Last 3 Encounters:  09/08/23 199 lb 6.4 oz (90.4 kg)  06/27/23 202 lb (91.6 kg)  05/04/23 206 lb 9.6 oz (93.7 kg)     ASSESSMENT & PLAN:   CAD involving the native coronary arteries without angina: He continues to do well and is without symptoms of angina or cardiac decompensation.  Continue aggressive risk factor modification and secondary prevention including aspirin and ticagrelor, though was taking Brilinta only once daily throughout the  fall months, now taking twice daily.  In the setting of persistent cardiomyopathy and frequent PVCs with sustained episodes of ventricular bigeminy and trigeminy we will pursue cardiac cath to help guide therapy moving forward.  He will otherwise continue Toprol-XL and rosuvastatin as outlined below.  Patient is followed by Dr. Herbie Baltimore, though due to patient's work schedule we will need a cardiac cath to be performed by Dr. Kirke Corin.   HFrEF secondary to mixed ICM and NICM: Euvolemic and well compensated with NYHA class I symptoms.  Updated echo showed a persistent stable cardiomyopathy with an EF of 40 to 45%.  It remains unclear at this time if his persistent cardiomyopathy is in the setting of infrequent PVCs  versus ischemic etiology.  Cannot exclude Adderall component as well.  Relative hypotension has previously precluded escalation of GDMT.  We will pursue cardiac cath as outlined above with continuation of Toprol-XL 150 mg daily, Farxiga 10 mg, and losartan 25 mg daily.  Based on cardiac cath results would escalate GDMT as able with transition of ARB to ARNI along with addition of MRA.  Not requiring a standing loop diuretic.  Pending cardiac cath may need to pursue cardiac MRI.  Frequent PVCs/NSVT: No ventricular ectopy noted on EKG today.  However, repeat outpatient again showed frequent PVC burden of up to 21.4% along with episode of ventricular bigeminy lasting 55 minutes and trigeminy lasting 35 minutes respectively.  He has been missing his second dose of Toprol-XL, and in this setting we will consolidate Toprol-XL to 150 mg daily.  It remains uncertain at this time if his ventricular ectopy is contributing to his persistent cardiomyopathy, or if his ventricular ectopy is in the setting of ischemic heart disease.  Given persistent frequent ventricular ectopy, and in the context of persistent cardiomyopathy, pursue diagnostic cath as outlined above.  If there is no significant ischemic heart disease  noted on cath would recommend EP evaluation with possible cardiac MRI, for ongoing management of frequent PVCs despite escalation of beta-blocker.  PSVT: Quiescent.  Remains on Toprol-XL as outlined above.  HTN: Blood pressure is well-controlled in the office today.  Historically has had relative hypotension.  In this setting, defer escalation of pharmacotherapy at this time.  Remains on Toprol-XL and losartan as outlined above.  HLD: LDL 65 in 02/2023.  Remains on rosuvastatin 40 mg.   Informed Consent   Shared Decision Making/Informed Consent{  The risks [stroke (1 in 1000), death (1 in 1000), kidney failure [usually temporary] (1 in 500), bleeding (1 in 200), allergic reaction [possibly serious] (1 in 200)], benefits (diagnostic support and management of coronary artery disease) and alternatives of a cardiac catheterization were discussed in detail with Shane [name] and he is willing to proceed.        Disposition: F/u with Dr. Herbie Baltimore or an APP 1-2 weeks after [facility].   Medication Adjustments/Labs and Tests Ordered: Current medicines are reviewed at length with the patient today.  Concerns regarding medicines are outlined above. Medication changes, Labs and Tests ordered today are summarized above and listed in the Patient Instructions accessible in Encounters.   Signed, Eula Listen, PA-C 09/08/2023 12:58 PM     [facility] [place] [facility] [address] [name], [place] [address] [phone]

## 2023-09-08 ENCOUNTER — Ambulatory Visit: Payer: BC Managed Care – PPO | Attending: Physician Assistant | Admitting: Physician Assistant

## 2023-09-08 ENCOUNTER — Encounter: Payer: Self-pay | Admitting: Physician Assistant

## 2023-09-08 ENCOUNTER — Other Ambulatory Visit: Payer: Self-pay

## 2023-09-08 VITALS — BP 130/91 | HR 72 | Ht 74.0 in | Wt 199.4 lb

## 2023-09-08 DIAGNOSIS — I498 Other specified cardiac arrhythmias: Secondary | ICD-10-CM

## 2023-09-08 DIAGNOSIS — I5020 Unspecified systolic (congestive) heart failure: Secondary | ICD-10-CM

## 2023-09-08 DIAGNOSIS — I493 Ventricular premature depolarization: Secondary | ICD-10-CM

## 2023-09-08 DIAGNOSIS — I4729 Other ventricular tachycardia: Secondary | ICD-10-CM

## 2023-09-08 DIAGNOSIS — I2510 Atherosclerotic heart disease of native coronary artery without angina pectoris: Secondary | ICD-10-CM

## 2023-09-08 DIAGNOSIS — I10 Essential (primary) hypertension: Secondary | ICD-10-CM

## 2023-09-08 DIAGNOSIS — E785 Hyperlipidemia, unspecified: Secondary | ICD-10-CM

## 2023-09-08 DIAGNOSIS — I471 Supraventricular tachycardia, unspecified: Secondary | ICD-10-CM

## 2023-09-08 DIAGNOSIS — I255 Ischemic cardiomyopathy: Secondary | ICD-10-CM

## 2023-09-08 MED ORDER — METOPROLOL SUCCINATE ER 100 MG PO TB24
150.0000 mg | ORAL_TABLET | Freq: Every day | ORAL | 3 refills | Status: DC
  Filled 2023-09-08: qty 90, 60d supply, fill #0
  Filled 2023-10-07: qty 45, 30d supply, fill #0

## 2023-09-08 NOTE — Patient Instructions (Signed)
Medication Instructions:  Your physician recommends the following medication changes.  DECREASE: Metoprolol 150 mg once daily (morning preferred)  *If you need a refill on your cardiac medications before your next appointment, please call your pharmacy*   Lab Work: Your provider would like for you to have following labs drawn today CBC and BMP.   If you have labs (blood work) drawn today and your tests are completely normal, you will receive your results only by: MyChart Message (if you have MyChart) OR A paper copy in the mail If you have any lab test that is abnormal or we need to change your treatment, we will call you to review the results.   Testing/Procedures:  [place] [facility] [place] [address] [place] [address] Dept: [phone] Loc: [phone]  JERIUS KOZIK  09/08/2023  You are scheduled for a Cardiac Catheterization on Thursday, December 12 with Dr. Lorine Bears.  1. Please arrive at the [facility] Entrance of [facility], [address], [place] [address] at 11:30 AM (This is 1 hour(s) prior to your procedure time).  Proceed to the Check-In Desk directly inside the entrance.  Procedure Parking: Use the entrance off of the [address] side of the hospital. Turn right upon entering and follow the driveway to parking that is directly in front of the [facility]. There is no valet parking available at this entrance, however there is an awning directly in front of the [facility] Center for drop off/ pick up for patients.  Special note: Every effort is made to have your procedure done on time. Please understand that emergencies sometimes delay scheduled procedures.  2. Diet: Do not eat solid foods after midnight.  The patient may have clear liquids until 5am upon the day of the procedure.  3. Labs: You will need to have blood  drawn on today.  4. Medication instructions in preparation for your procedure:   Contrast Allergy: No   On the morning of your procedure, take your Aspirin 81 mg and Brilinta/Ticagrelor and any morning medicines NOT listed above.  You may use sips of water.  5. Plan to go home the same day, you will only stay overnight if medically necessary. 6. Bring a current list of your medications and current insurance cards. 7. You MUST have a responsible person to drive you home. 8. Someone MUST be with you the first 24 hours after you arrive home or your discharge will be delayed. 9. Please wear clothes that are easy to get on and off and wear slip-on shoes.  Thank you for allowing [place] to care for you!   -- [facility]    Follow-Up: At [facility], you and your health needs are our priority.  As part of our continuing mission to provide you with exceptional heart care, we have created designated Provider Care Teams.  These Care Teams include your primary Cardiologist (physician) and Advanced Practice Providers (APPs -  Physician Assistants and Nurse Practitioners) who all work together to provide you with the care you need, when you need it.  We recommend signing up for the patient portal called "MyChart".  Sign up information is provided on this After Visit Summary.  MyChart is used to connect with patients for Virtual Visits (Telemedicine).  Patients are able to view lab/test results, encounter notes, upcoming appointments, etc.  Non-urgent messages  can be sent to your provider as well.   To learn more about what you can do with MyChart, go to [email or web address].    Your next appointment:   2-3 week(s) after 09/15/2023  Provider:   You may see Bryan Lemma, MD or one of the following Advanced Practice Providers on your designated Care Team:  Eula Listen, [place]

## 2023-09-09 LAB — BASIC METABOLIC PANEL
BUN/Creatinine Ratio: 11 (ref 9–20)
BUN: 11 mg/dL (ref 6–24)
CO2: 24 mmol/L (ref 20–29)
Calcium: 10.6 mg/dL — ABNORMAL HIGH (ref 8.7–10.2)
Chloride: 103 mmol/L (ref 96–106)
Creatinine, Ser: 1.04 mg/dL (ref 0.76–1.27)
Glucose: 91 mg/dL (ref 70–99)
Potassium: 4.8 mmol/L (ref 3.5–5.2)
Sodium: 143 mmol/L (ref 134–144)
eGFR: 87 mL/min/{1.73_m2} (ref >59)

## 2023-09-09 LAB — CBC
Hematocrit: 52.3 % — ABNORMAL HIGH (ref 37.5–51.0)
Hemoglobin: 17 g/dL (ref 13.0–17.7)
MCH: 32.1 pg (ref 26.6–33.0)
MCHC: 32.5 g/dL (ref 31.5–35.7)
MCV: 99 fL — ABNORMAL HIGH (ref 79–97)
Platelets: 291 10*3/uL (ref 150–450)
RBC: 5.3 x10E6/uL (ref 4.14–5.80)
RDW: 12.2 % (ref 11.6–15.4)
WBC: 7.6 10*3/uL (ref 3.4–10.8)

## 2023-09-15 ENCOUNTER — Encounter: Payer: Self-pay | Admitting: Cardiovascular Disease

## 2023-09-15 ENCOUNTER — Other Ambulatory Visit: Payer: Self-pay

## 2023-09-15 ENCOUNTER — Encounter
Admission: RE | Disposition: A | Discharge status: Home / Self Care | Payer: Self-pay | Source: Home / Self Care | Attending: Cardiovascular Disease

## 2023-09-15 ENCOUNTER — Ambulatory Visit
Admission: RE | Admit: 2023-09-15 | Discharge: 2023-09-15 | Disposition: A | Discharge status: Home / Self Care | Payer: BC Managed Care – PPO | Attending: Cardiovascular Disease | Admitting: Cardiovascular Disease

## 2023-09-15 DIAGNOSIS — I252 Old myocardial infarction: Secondary | ICD-10-CM | POA: Insufficient documentation

## 2023-09-15 DIAGNOSIS — F988 Other specified behavioral and emotional disorders with onset usually occurring in childhood and adolescence: Secondary | ICD-10-CM | POA: Insufficient documentation

## 2023-09-15 DIAGNOSIS — Z7902 Long term (current) use of antithrombotics/antiplatelets: Secondary | ICD-10-CM | POA: Insufficient documentation

## 2023-09-15 DIAGNOSIS — I429 Cardiomyopathy, unspecified: Secondary | ICD-10-CM

## 2023-09-15 DIAGNOSIS — E785 Hyperlipidemia, unspecified: Secondary | ICD-10-CM | POA: Diagnosis not present

## 2023-09-15 DIAGNOSIS — Z955 Presence of coronary angioplasty implant and graft: Secondary | ICD-10-CM | POA: Diagnosis not present

## 2023-09-15 DIAGNOSIS — I255 Ischemic cardiomyopathy: Secondary | ICD-10-CM | POA: Diagnosis not present

## 2023-09-15 DIAGNOSIS — Z79899 Other long term (current) drug therapy: Secondary | ICD-10-CM | POA: Insufficient documentation

## 2023-09-15 DIAGNOSIS — I110 Hypertensive heart disease with heart failure: Secondary | ICD-10-CM | POA: Insufficient documentation

## 2023-09-15 DIAGNOSIS — F17210 Nicotine dependence, cigarettes, uncomplicated: Secondary | ICD-10-CM | POA: Diagnosis not present

## 2023-09-15 DIAGNOSIS — I5022 Chronic systolic (congestive) heart failure: Secondary | ICD-10-CM | POA: Insufficient documentation

## 2023-09-15 DIAGNOSIS — I2510 Atherosclerotic heart disease of native coronary artery without angina pectoris: Secondary | ICD-10-CM | POA: Diagnosis not present

## 2023-09-15 DIAGNOSIS — I493 Ventricular premature depolarization: Secondary | ICD-10-CM | POA: Diagnosis not present

## 2023-09-15 DIAGNOSIS — F419 Anxiety disorder, unspecified: Secondary | ICD-10-CM | POA: Diagnosis not present

## 2023-09-15 DIAGNOSIS — Z7982 Long term (current) use of aspirin: Secondary | ICD-10-CM | POA: Diagnosis not present

## 2023-09-15 HISTORY — PX: LEFT HEART CATH AND CORONARY ANGIOGRAPHY: CATH118249

## 2023-09-15 SURGERY — LEFT HEART CATH AND CORONARY ANGIOGRAPHY
Anesthesia: Moderate Sedation

## 2023-09-15 MED ORDER — LIDOCAINE HCL (PF) 1 % IJ SOLN
INTRAMUSCULAR | Status: DC | PRN
  Administered 2023-09-15: 5 mL

## 2023-09-15 MED ORDER — VERAPAMIL HCL 2.5 MG/ML IV SOLN
INTRAVENOUS | Status: AC
Start: 2023-09-15 — End: ?
  Filled 2023-09-15: qty 2

## 2023-09-15 MED ORDER — ASPIRIN 81 MG PO CHEW: 81.0000 mg | CHEWABLE_TABLET | ORAL | Status: DC

## 2023-09-15 MED ORDER — FENTANYL CITRATE (PF) 100 MCG/2ML IJ SOLN
INTRAMUSCULAR | Status: DC | PRN
  Administered 2023-09-15: 25 ug via INTRAVENOUS

## 2023-09-15 MED ORDER — SODIUM CHLORIDE 0.9 % IV SOLN: INTRAVENOUS | Status: DC

## 2023-09-15 MED ORDER — HEPARIN (PORCINE) IN NACL 1000-0.9 UT/500ML-% IV SOLN
INTRAVENOUS | Status: AC
Start: 2023-09-15 — End: ?
  Filled 2023-09-15: qty 1000

## 2023-09-15 MED ORDER — VERAPAMIL HCL 2.5 MG/ML IV SOLN
INTRAVENOUS | Status: DC | PRN
  Administered 2023-09-15: 2.5 mg via INTRAVENOUS

## 2023-09-15 MED ORDER — SODIUM CHLORIDE 0.9% FLUSH: 3.0000 mL | INTRAVENOUS | Status: DC | PRN

## 2023-09-15 MED ORDER — FENTANYL CITRATE (PF) 100 MCG/2ML IJ SOLN
INTRAMUSCULAR | Status: AC
  Filled 2023-09-15: qty 2

## 2023-09-15 MED ORDER — HEPARIN SODIUM (PORCINE) 1000 UNIT/ML IJ SOLN
INTRAMUSCULAR | Status: DC | PRN
  Administered 2023-09-15: 4000 [IU] via INTRAVENOUS

## 2023-09-15 MED ORDER — IOHEXOL 300 MG/ML  SOLN
INTRAMUSCULAR | Status: DC | PRN
  Administered 2023-09-15: 25 mL

## 2023-09-15 MED ORDER — SODIUM CHLORIDE 0.9 % IV SOLN: 250.0000 mL | INTRAVENOUS | Status: DC | PRN

## 2023-09-15 MED ORDER — LIDOCAINE HCL 1 % IJ SOLN
INTRAMUSCULAR | Status: AC
Start: 2023-09-15 — End: ?
  Filled 2023-09-15: qty 20

## 2023-09-15 MED ORDER — MIDAZOLAM HCL 2 MG/2ML IJ SOLN
INTRAMUSCULAR | Status: AC
  Filled 2023-09-15: qty 2

## 2023-09-15 MED ORDER — HEPARIN SODIUM (PORCINE) 1000 UNIT/ML IJ SOLN
INTRAMUSCULAR | Status: AC
  Filled 2023-09-15: qty 10

## 2023-09-15 MED ORDER — ACETAMINOPHEN 325 MG PO TABS
650.0000 mg | ORAL_TABLET | ORAL | Status: DC | PRN
Start: 2023-09-15 — End: 2023-09-15

## 2023-09-15 MED ORDER — SODIUM CHLORIDE 0.9% FLUSH: 3.0000 mL | Freq: Two times a day (BID) | INTRAVENOUS | Status: DC

## 2023-09-15 MED ORDER — MIDAZOLAM HCL 2 MG/2ML IJ SOLN
INTRAMUSCULAR | Status: DC | PRN
  Administered 2023-09-15: 1 mg via INTRAVENOUS

## 2023-09-15 MED ORDER — HEPARIN (PORCINE) IN NACL 2000-0.9 UNIT/L-% IV SOLN
INTRAVENOUS | Status: DC | PRN
  Administered 2023-09-15: 1000 mL

## 2023-09-15 MED ORDER — ONDANSETRON HCL 4 MG/2ML IJ SOLN: 4.0000 mg | Freq: Four times a day (QID) | INTRAMUSCULAR | Status: DC | PRN

## 2023-09-15 SURGICAL SUPPLY — 12 items
CATH INFINITI 5 FR JL3.5 (CATHETERS) IMPLANT
CATH INFINITI AMBI 5FR JK (CATHETERS) IMPLANT
CATH INFINITI JR4 5F (CATHETERS) IMPLANT
DEVICE RAD TR BAND REGULAR (VASCULAR PRODUCTS) IMPLANT
DRAPE BRACHIAL (DRAPES) IMPLANT
GLIDESHEATH SLEND SS 6F .021 (SHEATH) IMPLANT
GUIDEWIRE INQWIRE 1.5J.035X260 (WIRE) IMPLANT
INQWIRE 1.5J .035X260CM (WIRE) ×1
PACK CARDIAC CATH (CUSTOM PROCEDURE TRAY) ×1 IMPLANT
PROTECTION STATION PRESSURIZED (MISCELLANEOUS) ×1
SET ATX-X65L (MISCELLANEOUS) IMPLANT
STATION PROTECTION PRESSURIZED (MISCELLANEOUS) IMPLANT

## 2023-09-15 NOTE — Interval H&P Note (Signed)
History and Physical Interval Note:  09/15/2023 1:35 PM  Shane [name]  has presented today for surgery, with the diagnosis of L Cath   Cardiomyopathy  Frequent PVC's.  The various methods of treatment have been discussed with the patient and family. After consideration of risks, benefits and other options for treatment, the patient has consented to  Procedure(s): LEFT HEART CATH AND CORONARY ANGIOGRAPHY (N/A) as a surgical intervention.  The patient's history has been reviewed, patient examined, no change in status, stable for surgery.  I have reviewed the patient's chart and labs.  Questions were answered to the patient's satisfaction.     Lorine Bears

## 2023-09-16 ENCOUNTER — Encounter: Payer: Self-pay | Admitting: Cardiovascular Disease

## 2023-10-07 ENCOUNTER — Other Ambulatory Visit: Payer: Self-pay

## 2023-10-10 ENCOUNTER — Ambulatory Visit: Payer: BC Managed Care – PPO | Admitting: Family

## 2023-10-10 ENCOUNTER — Telehealth: Payer: Self-pay

## 2023-10-10 NOTE — Telephone Encounter (Signed)
 Lvm TO CALL BACK TO RESCHEDULE APPT FOR 10/10/2023

## 2023-10-17 NOTE — Progress Notes (Signed)
 Cardiology Office Note    Date:  10/18/2023   ID:  Shane [name], DOB September 08, 1971, MRN [id number]  PCP:  Dineen Rollene MATSU, FNP  Cardiologist:  Alm Clay, MD  Electrophysiologist:  None   Chief Complaint: Follow-up  History of Present Illness:   Shane [name] is a 53 y.o. male with history of CAD with inferior ST elevation MI in 08/2022 status post thrombectomy with PCI and 2 overlapping DES, HFrEF secondary to ICM, frequent PVCs, HTN, HLD, ADD, anxiety, and prior tobacco use who presents for follow-up of LHC, CAD, ICM, and frequent PVCs.   He was admitted in 08/2022 with inferior ST elevation MI with suspected RV infarct.  LHC showed thrombotic occlusion of the RCA with large thrombus burden.  This was treated successfully with aspiration thrombectomy and 2 overlapping drug-eluting stents.  There was no other obstructive disease.  Echo at that time showed an EF of 35 to 40%, global hypokinesis, grade 1 diastolic dysfunction, mildly reduced RV systolic function with normal ventricular cavity size, normal PASP, aortic sclerosis without evidence of stenosis.  Relative hypotension has precluded escalation of GDMT.  Most recent echo from 01/2023 demonstrated an EF of 40 to 45%, global hypokinesis, mildly dilated internal LV cavity size, grade 1 diastolic dysfunction, normal RV systolic function and ventricular cavity size, and mild mitral regurgitation.  He was seen in the office in 02/2023 for follow-up of his echo and was without symptoms of angina or cardiac decompensation.  He did feel like his stamina was not where he used to be.  He also reported a couple episodes of palpitations.  EKG at that time was notable for sinus rhythm with frequent PVCs.  Toprol  was titrated to 100 mg daily (on day 4 of Zio patch) and losartan  25 mg daily was added.  Subsequent Zio patch showed a predominant rhythm of sinus with an average rate of 92 bpm (range 54 to 231 bpm), 2 episodes of NSVT occurred with  the fastest interval lasting 4 beats with a maximum rate of 184 bpm and the longest interval lasting 8 beats with an average rate of 140 bpm, 1 run of SVT occurred lasting 15 beats with a maximum rate of 231 bpm (average 209 bpm), rare PACs and atrial couplets, frequent PVCs with an overall burden of 20.9%, and rare ventricular couplets/triplets.  Ventricular bigeminy and trigeminy were present.  Upon reviewing ventricular ectopy burden, after he titrated from Toprol -XL to 100 mg daily on day 4 of his Zio patch, his ectopy burden was significantly improved, decreasing by approximately 50%.  He was seen in the office on 03/15/2023 and remained without symptoms of angina or cardiac decompensation.  Following addition of losartan  and titration of Toprol  he did note some positional dizziness that had subsequently improved.  He felt like palpitation burden had improved somewhat as well.  Toprol  was further titrated with addition to 50 mg in the evening with continuation of 100 mg in the morning.  He was seen in the office in 04/2023 and remained without symptoms of angina or cardiac decompensation.  He denied any further palpitations following titration of metoprolol .  EKG at that time showed no PVCs.  Limited echo in 07/2023 showed a persistent cardiomyopathy with an EF of 40 to 45%, global hypokinesis, grade 1 diastolic dysfunction, normal RV systolic function and ventricular cavity size, mild mitral regurgitation, and an estimated right atrial pressure of 3 mmHg.  Frequent PVCs were noted during the study.  In this  setting he underwent repeat outpatient cardiac monitoring that showed a predominant rhythm of sinus with an average rate of 90 bpm (range 54 to 140 bpm), frequent PVCs with an overall burden of 21.4%, occasional ventricular couplets, and rare ventricular triplets.  Ventricular bigeminy and trigeminy were present, lasting up to 55 and 35 minutes respectively.  He was seen in follow-up in 09/2023 and reported  frequently missing his second dose of metoprolol , and was taking ticagrelor  once daily over the fall months.  In the setting of his persistent cardiomyopathy and PVC burden, he underwent repeat LHC on 09/15/2023 that showed wide patent previously placed proximal to mid RCA stent with near normal left coronary arteries.  Mildly elevated LVEDP at 14 mmHg.  Continued medical therapy was recommended.  He comes in doing well from a cardiac perspective and is without symptoms of angina or cardiac decompensation.  No dyspnea, palpitations, presyncope, or syncope.  She does continue to note stable occasional dizziness.  No lower extremity swelling or progressive orthopnea.  No falls or symptoms concerning for bleeding.  Weight stable.     Labs independently reviewed: 09/2023 - BUN 11, serum creatinine 1.04, potassium 4.8, Hgb 17.0, PLT 291 02/2023 - albumin 4.2, AST 44, ALT 53, TC 135, TG 65, HDL 57, LDL 65 09/2022 - LP(a) 9.5 07/2022 - A1c 5.8 09/2021 - TSH normal  Past Medical History:  Diagnosis Date   Acute ST elevation myocardial infarction (STEMI) of inferolateral wall (HCC) 09/02/2022   Extensive thrombus in the RCA-thrombectomy with overlapping 3.5 mm stents (38 and 30 mm)   ADD (attention deficit disorder)    Alcohol abuse    History of.   Anxiety    Chronic HFrEF (heart failure with reduced ejection fraction) (HCC)    a. 09/2022 Echo: EF 35-40%, glob HK, GrI DD, mildly reduced RV fxn, RVSP 31.61mmHg, no significant valvular disease.   Coronary artery disease involving native coronary artery of native heart 08/2022   a. 08/2022 Inflat STEMI/PCI: LM nl, LAD nl, D1/2 nl, RI nl, LCX nl, OM1/2 nl, RCA 100p/m (thrombectomy & 3.5x38 & 3.5x30 overlapping Onyx Frontier DESs), 65m/d (thombectomy and PTCA into RPAV branch), RPDA 100, RPL1 100, EF 45-50%.   Depression    Frequent PVCs 02/17/2023   Hyperlipidemia    Hypertension    Ischemic cardiomyopathy    a. 09/2022 Echo: EF 35-40%.   Ischemic  cardiomyopathy 02/17/2023   09/2022 (inferior STEMI) echo: EF 35-40%, glob HK, GrI DD, mildly reduced RV fxn, RVSP 31.31mmHg, no significant valvular disease.  TTE follow-up 01/06/2023: EF remains 40 to 45% with mild to moderate dysfunction       Past Surgical History:  Procedure Laterality Date   CORONARY/GRAFT ACUTE MI REVASCULARIZATION N/A 09/02/2022   Procedure: Coronary/Graft Acute MI Revascularization;  Surgeon: Anner Alm ORN, MD;  Location: [facility];  Service: Cardiovascular;  Laterality: N/A;   LEFT HEART CATH AND CORONARY ANGIOGRAPHY N/A 09/15/2023   Procedure: LEFT HEART CATH AND CORONARY ANGIOGRAPHY;  Surgeon: Darron Deatrice LABOR, MD;  Location: [facility];  Service: Cardiovascular;  Laterality: N/A;   LEFT HEART CATH AND CORS/GRAFTS ANGIOGRAPHY N/A 09/02/2022   Procedure: LEFT HEART CATH AND CORS/GRAFTS ANGIOGRAPHY;  Surgeon: Anner Alm ORN, MD;  Location: [facility];  Service: Cardiovascular;  Laterality: N/A;   TENDON REPAIR Right 09/15/2015   Procedure: RIGHT FOREARM EXPLORATION AND CLOSURE OF LACERATION, TENDON REPAIR;  Surgeon: Prentice Pagan, MD;  Location: [facility];  Service: Orthopedics;  Laterality:  Right;    Current Medications: Current Meds  Medication Sig   amphetamine -dextroamphetamine  (ADDERALL ) 20 MG tablet Take 1 tablet (20 mg total) by mouth 2 (two) times daily. (Patient taking differently: Take 20 mg by mouth 2 (two) times daily. Do not take on days off)   aspirin  81 MG chewable tablet Chew 1 tablet (81 mg total) by mouth daily.   losartan  (COZAAR ) 25 MG tablet Take 0.5 tablets (12.5 mg total) by mouth daily for 7 days, THEN 1 tablet (25 mg total) daily. (Patient taking differently: 25 mg total) daily.)   mirtazapine  (REMERON ) 15 MG tablet Take 1 tablet (15 mg total) by mouth at bedtime. (Patient taking differently: Take 15 mg by mouth at bedtime as needed (Sleep).)   rosuvastatin  (CRESTOR ) 40 MG tablet Take 1 tablet (40 mg total) by  mouth daily.   sertraline  (ZOLOFT ) 100 MG tablet Take 1 tablet (100 mg total) by mouth daily.   [DISCONTINUED] dapagliflozin  propanediol (FARXIGA ) 10 MG TABS tablet Take 1 tablet (10 mg total) by mouth daily.   [DISCONTINUED] metoprolol  succinate (TOPROL  XL) 100 MG 24 hr tablet Take 1.5 tablets (150 mg total) by mouth daily. Take every morning.   [DISCONTINUED] metoprolol  succinate (TOPROL -XL) 50 MG 24 hr tablet Take 1 tablet (50 mg total) by mouth daily. Take with or immediately following a meal.   [DISCONTINUED] ticagrelor  (BRILINTA ) 90 MG TABS tablet Take 1 tablet (90 mg total) by mouth 2 (two) times daily.    Allergies:   Patient has no known allergies.   Social History   Socioeconomic History   Marital status: Divorced    Spouse name: Pearl Berlinger   Number of children: 2   Years of education: 14   Highest education level: Not on file  Occupational History    Comment: [employer]  Tobacco Use   Smoking status: Some Days    Current packs/day: 0.00    Average packs/day: 0.3 packs/day for 20.0 years (5.0 ttl pk-yrs)    Types: Cigarettes    Start date: 09/02/2002    Last attempt to quit: 09/02/2022    Years since quitting: 1.1   Smokeless tobacco: Never   Tobacco comments:    1/2 PPW; 02/17/2023  Vaping Use   Vaping status: Never Used  Substance and Sexual Activity   Alcohol use: No    Comment: no alcohol since 12/16/2012   Drug use: No   Sexual activity: Yes    Partners: Female  Other Topics Concern   Not on file  Social History Narrative   Divorced in 08/2020; they have  two daughters (22 and 35 )    In the past-Rebuilds induction coils for work   05/2022- started [employer]   Social Drivers of Health   Financial Resource Strain: Not on file  Food Insecurity: Not on file  Transportation Needs: Not on file  Physical Activity: Not on file  Stress: Not on file  Social Connections: Not on file     Family History:  The patient's family history  includes Alcohol abuse in his maternal aunt and paternal grandfather; Heart attack (age of onset: 84) in his paternal uncle; Heart attack (age of onset: 81) in his maternal grandfather; Heart attack (age of onset: 59) in his maternal great-grandfather; Hyperlipidemia in his father and another family member; Hypertension in his cousin and father; Lung cancer in his maternal grandfather; Prostate cancer in his father.  ROS:   12-point review of systems is negative unless otherwise noted in  the HPI.   EKGs/Labs/Other Studies Reviewed:    Studies reviewed were summarized above. The additional studies were reviewed today:  LHC 09/15/2023:   Previously placed Prox RCA to Mid RCA stent of unknown type is  widely patent.   1.  Widely patent RCA stents with no significant restenosis. Near normal left coronary arteries. 2.  Left ventricular angiography was not performed.  EF was mildly reduced by echo. 3.  Mildly elevated left ventricular end-diastolic pressure at 14 mmHg.   Recommendations: Continue medical therapy for coronary artery disease and cardiomyopathy. __________  Zio patch 07/2023: Patient had a min HR of 54 bpm, max HR of 140 bpm, and avg HR of 90 bpm. Predominant underlying rhythm was Sinus Rhythm. sSlight P wave morphology changes were noted.Isolated SVEs were rare (<1.0%), and no SVE Couplets or SVE Triplets were present.  Isolated VEs were frequent (21.4%, 181245), VE Couplets were occasional (2.2%, 9155), and VE Triplets were rare (<1.0%, 269). Ventricular Bigeminy and Trigeminy were present. __________   Limited echo 07/05/2023: 1. Left ventricular ejection fraction, by estimation, is 40 to 45%. Left  ventricular ejection fraction by 3D volume is 35 %. The left ventricle has  mildly decreased function. The left ventricle demonstrates global  hypokinesis. Left ventricular diastolic   parameters are consistent with Grade I diastolic dysfunction (impaired  relaxation). The  average left ventricular global longitudinal strain is  -13.8 %. The global longitudinal strain is abnormal.   2. Right ventricular systolic function is normal. The right ventricular  size is normal.   3. The mitral valve is normal in structure. Mild mitral valve  regurgitation. No evidence of mitral stenosis.   4. The aortic valve is normal in structure. Aortic valve regurgitation is  mild. No aortic stenosis is present.   5. The inferior vena cava is normal in size with greater than 50%  respiratory variability, suggesting right atrial pressure of 3 mmHg.   6. Frequent PVCs  __________   Zio patch 02/2023:   ~9-day Zio patch monitor (May 2024)   Predominant Underlying Rhythm-Sinus: HR range 54-139 bpm, Avg 89 bpm.   Frequent PVCs ~21% with rare couplets and triplets, but up to 20 minutes of bigeminy/trigeminy noted.  Symptoms noted with sinus rhythm and either isolated PVCs or PVCs and bigeminy.   Rare PACs.   2 short bursts of PVCs (Nonsustained Ventricular Tachycardia) -> fastest was 4 beats (1.7 seconds) HR 110-184 bpm, Avg 151 bpm. => Goes from bigeminy to a fusion beat bigeminy followed by another physician..  Not true V. tach.   1 atrial run of 15 beats - HR range 181 to 231 bpm, Avg 209 bpm (4.7 sec)   No Sustained Arrhythmias: Atrial Tachycardia (AT), Supraventricular Tachycardia (SVT), Atrial Fibrillation (A-Fib), Atrial Flutter (A-Flutter), Sustained Ventricular Tachycardia (VT) __________   2D echo 01/06/2023: 1. Left ventricular ejection fraction, by estimation, is 40 to 45%. The  left ventricle has mild to moderately decreased function. The left  ventricle demonstrates global hypokinesis. The left ventricular internal  cavity size was mildly dilated. Left  ventricular diastolic parameters are consistent with Grade I diastolic  dysfunction (impaired relaxation). The average left ventricular global  longitudinal strain is -13.9 %. The global longitudinal strain is  abnormal.    2. Right ventricular systolic function is normal. The right ventricular  size is normal.   3. The mitral valve is normal in structure. Mild mitral valve  regurgitation.   4. The aortic valve is tricuspid. Aortic valve  regurgitation is mild.   5. The inferior vena cava is dilated in size with <50% respiratory  variability, suggesting right atrial pressure of 15 mmHg.  __________   2D echo 09/03/2022: 1. Left ventricular ejection fraction, by estimation, is 35 to 40%. The  left ventricle has moderately decreased function. The left ventricle  demonstrates global hypokinesis. Left ventricular diastolic parameters are  consistent with Grade I diastolic  dysfunction (impaired relaxation).   2. Right ventricular systolic function is mildly reduced. The right  ventricular size is normal. There is normal pulmonary artery systolic  pressure. The estimated right ventricular systolic pressure is 31.8 mmHg.   3. The mitral valve is normal in structure. No evidence of mitral valve  regurgitation. No evidence of mitral stenosis.   4. The aortic valve is normal in structure. Aortic valve regurgitation is  not visualized. Aortic valve sclerosis is present, with no evidence of  aortic valve stenosis.   5. The inferior vena cava is dilated in size with <50% respiratory  variability, suggesting right atrial pressure of 15 mmHg.  __________   LHC 09/02/2022:   Prox RCA to Mid RCA lesion is 100% stenosed.   Balloon angioplasty was performed using a BALLN TREK RX 3.0X20.   Aspiration thrombectomy performed using Penumbra catheter   A drug-eluting stent was successfully placed using a STENT ONYX FRONTIER 3.5X38.  Postdilated to 3.6 mm   A 2nd drug-eluting stent was successfully placed overlapping the previous stent proximally to cover the entire lesion, using a STENT ONYX FRONTIER 3.5X30.  Postdilated to 3.65 mm   Post intervention, there is a 0% residual stenosis for both stents.    -------------------------   Mid RCA to Dist RCA lesion is 95% stenosed with 95% stenosed side branch in RPAV.   Balloon angioplasty was performed using a BALLN TREK RX 3.0X20 -with minimal benefit.   Aspiration thrombectomy performed using Penumbra catheter from the distal RCA although into the PL branch restoring TIMI-3 flow.   Post intervention, there is a 0% residual stenosis. Post intervention, the side branch was reduced to 0% residual stenosis.   1st RPL lesion is 100% stenosed.  RPDA lesion is 100% stenosed -slow flow to the apex because of distal embolization.   -------------------------   Normal Left Coronary System   There is mild to moderate left ventricular systolic dysfunction.  The left ventricular ejection fraction is 45-50% by visual estimate -> basal to mid inferior wall severe hypo to akinesis   LV end diastolic pressure is moderately elevated.-24 mmHg   There is no aortic valve stenosis.   POST-CATH DIAGNOSES Acute Inferolateral STEMI-suspect possible RV involvement with mildly reduced EF and basal to mid inferior akinesis to severe hypokinesis.  EF estimated roughly 45% CULPRIT Lesion is 100% near still RCA heavily thrombotic occlusion with extensive thrombus throughout the entire RCA requiring Penumbra thrombectomy to both the proximal and distal portion of the vessel finally restoring TIMI-3 flow to all but the very apical portions of the RPL and PDA. =>  The proximal to mid will disease vessel had received treated with 2 overlapping Onyx Frontier DES Stents (3.5 mm 38 mm and 3.5 mm x 30 mm with roughly 2 to 3 mm overlap.  Stents were post-dilated to 3.65 mm) => TIMI 0 flow reduced to TIMI-3 flow with 0% residual stenosis   Angiographically normal Left Coronary System with a non--dominant Cx and a Large Ramus Intermedius that is the same size as it is a wraparound LAD.  Temporary  hypotension not unexpected with RV infarct, responded well to 2 500 mL boluses and short run of  Levophed  that was reduced and weaned off prior to leaving the Cath Lab.  Blood pressures were in the 110/80 range.     RECOMMENDATIONS Admit to ICU-we will continue Aggrastat  for 12 hours post PCI Check 2D echo in the morning-(already ordered) Cycle troponins with troponin level in the morning. We will start low-dose metoprolol  12.5 mg twice daily-titrate if possible. High-dose statin-check lipid panel in the morning. Smoke cessation counseling-he says that he will quit smoking.    EKG:  EKG is ordered today.  The EKG ordered today demonstrates NSR, 85 bpm, prior inferior MI, consistent with prior tracing  Recent Labs: 02/17/2023: ALT 53 09/08/2023: BUN 11; Creatinine, Ser 1.04; Hemoglobin 17.0; Platelets 291; Potassium 4.8; Sodium 143  Recent Lipid Panel    Component Value Date/Time   CHOL 135 02/17/2023 1140   TRIG 65 02/17/2023 1140   HDL 57 02/17/2023 1140   CHOLHDL 2.4 02/17/2023 1140   VLDL 13 02/17/2023 1140   LDLCALC 65 02/17/2023 1140    PHYSICAL EXAM:    VS:  BP 124/89 (BP Location: Left Arm, Patient Position: Sitting, Cuff Size: Normal)   Pulse 85   Ht 6' 2 (1.88 m)   Wt 200 lb 6.4 oz (90.9 kg)   SpO2 98%   BMI 25.73 kg/m   BMI: Body mass index is 25.73 kg/m.  Physical Exam Vitals reviewed.  Constitutional:      Appearance: He is well-developed.  HENT:     Head: Normocephalic and atraumatic.  Eyes:     General:        Right eye: No discharge.        Left eye: No discharge.  Neck:     Vascular: No JVD.  Cardiovascular:     Rate and Rhythm: Normal rate and regular rhythm. Occasional Extrasystoles are present.    Heart sounds: Normal heart sounds, S1 normal and S2 normal. Heart sounds not distant. No midsystolic click and no opening snap. No murmur heard.    No friction rub.  Pulmonary:     Effort: Pulmonary effort is normal. No respiratory distress.     Breath sounds: Normal breath sounds. No decreased breath sounds, wheezing, rhonchi or rales.   Chest:     Chest wall: No tenderness.  Abdominal:     General: There is no distension.  Musculoskeletal:     Cervical back: Normal range of motion.     Right lower leg: No edema.     Left lower leg: No edema.  Skin:    General: Skin is warm and dry.     Nails: There is no clubbing.  Neurological:     Mental Status: He is alert and oriented to person, place, and time.  Psychiatric:        Speech: Speech normal.        Behavior: Behavior normal.        Thought Content: Thought content normal.        Judgment: Judgment normal.     Wt Readings from Last 3 Encounters:  10/18/23 200 lb 6.4 oz (90.9 kg)  09/15/23 201 lb 8 oz (91.4 kg)  09/08/23 199 lb 6.4 oz (90.4 kg)     ASSESSMENT & PLAN:   CAD involving the native coronary arteries without angina: He continues to do well and is without symptoms concerning for angina or cardiac decompensation.  Recent LHC showed widely patent  RCA stent with otherwise no obstructive disease along the left coronary tree.  Given he is now greater than 12 months out from PCI, we will reduce ticagrelor  to 60 mg twice daily with continuation of aspirin  81 mg daily for another 12 months.  He will otherwise continue Toprol -XL and rosuvastatin .  No indication for further ischemic testing at this time.  HFrEF secondary to mixed ICM and NICM: Euvolemic and well compensated with NYHA class I symptoms.  Despite PCI to the RCA in 09/2022, he has had a persistent cardiomyopathy.  He continues to have frequent PVCs which may be contributing to his cardiomyopathy.  Also, cannot exclude Adderall  as a contributing factor.  Relative hypotension has historically precluded escalation of GDMT.  Continue Toprol -XL 150 mg daily, Farxiga  10 mg, and losartan  25 mg daily.  Schedule cardiac MRI.  Frequent PVCs/NSVT: No ventricular ectopy noted on EKG today, however he did have occasional extrasystoles on exam today.  Her repeat outpatient cardiac monitor again showed frequent PVC  burden of 21.4% along with episodes of ventricular bigeminy lasting 55 minutes and ventricular trigeminy lasting 35 minutes respectively.  He remains on Toprol -XL 150 mg daily.  Ischemic component of his PVC burden has been excluded as outlined above.  Obtain cardiac MRI.  Refer to EP for further management of frequent PVCs.  Obtain CBC, magnesium, and TSH.  Recent potassium at goal.  PSVT: Quiescent.  Remains on Toprol -XL as outlined above.  HTN: Blood pressure is well-controlled in the office today.  Continue pharmacotherapy as outlined above.  HLD: LDL 65 in 02/2023.  He remains on rosuvastatin  40 mg.      Disposition: F/u with Dr. Anner or an APP in 3 months.   Medication Adjustments/Labs and Tests Ordered: Current medicines are reviewed at length with the patient today.  Concerns regarding medicines are outlined above. Medication changes, Labs and Tests ordered today are summarized above and listed in the Patient Instructions accessible in Encounters.   Signed, Bernardino Bring, PA-C 10/18/2023 4:57 PM     [facility] [place] [address] [place], [place] [address] [phone]

## 2023-10-18 ENCOUNTER — Ambulatory Visit: Payer: BC Managed Care – PPO | Attending: Physician Assistant | Admitting: Physician Assistant

## 2023-10-18 ENCOUNTER — Other Ambulatory Visit: Payer: Self-pay

## 2023-10-18 ENCOUNTER — Encounter: Payer: Self-pay | Admitting: Physician Assistant

## 2023-10-18 VITALS — BP 124/89 | HR 85 | Ht 74.0 in | Wt 200.4 lb

## 2023-10-18 DIAGNOSIS — E785 Hyperlipidemia, unspecified: Secondary | ICD-10-CM

## 2023-10-18 DIAGNOSIS — I5020 Unspecified systolic (congestive) heart failure: Secondary | ICD-10-CM

## 2023-10-18 DIAGNOSIS — I471 Supraventricular tachycardia, unspecified: Secondary | ICD-10-CM

## 2023-10-18 DIAGNOSIS — I498 Other specified cardiac arrhythmias: Secondary | ICD-10-CM

## 2023-10-18 DIAGNOSIS — I493 Ventricular premature depolarization: Secondary | ICD-10-CM

## 2023-10-18 DIAGNOSIS — I2510 Atherosclerotic heart disease of native coronary artery without angina pectoris: Secondary | ICD-10-CM | POA: Diagnosis not present

## 2023-10-18 DIAGNOSIS — I10 Essential (primary) hypertension: Secondary | ICD-10-CM

## 2023-10-18 DIAGNOSIS — I4729 Other ventricular tachycardia: Secondary | ICD-10-CM

## 2023-10-18 MED ORDER — TICAGRELOR 60 MG PO TABS
60.0000 mg | ORAL_TABLET | Freq: Two times a day (BID) | ORAL | 11 refills | Status: AC
  Filled 2023-10-18: qty 60, 30d supply, fill #0
  Filled 2024-01-27: qty 60, 30d supply, fill #1
  Filled 2024-04-30 (×2): qty 60, 30d supply, fill #2
  Filled 2024-09-14: qty 60, 30d supply, fill #3

## 2023-10-18 NOTE — Patient Instructions (Signed)
 Medication Instructions:  Your physician recommends the following medication changes.  DECREASE: Brillinta 60 mg twice daily  *If you need a refill on your cardiac medications before your next appointment, please call your pharmacy*   Lab Work: Your provider would like for you to have following labs drawn today CBC, Mag level, and TSH.   If you have labs (blood work) drawn today and your tests are completely normal, you will receive your results only by: MyChart Message (if you have MyChart) OR A paper copy in the mail If you have any lab test that is abnormal or we need to change your treatment, we will call you to review the results.   Testing/Procedures: Your physician has requested that you have a cardiac MRI. Cardiac MRI uses a computer to create images of your heart as its beating, producing both still and moving pictures of your heart and major blood vessels. For further information please visit [email or web address]. Please follow the instruction sheet given to you today for more information.  [facility] [address] [place], [place] [address] [phone] Please go to the [facility] and check-in with the desk attendant.   Magnetic resonance imaging (MRI) is a painless test that produces images of the inside of the body without using Xrays.  During an MRI, strong magnets and radio waves work together in a data processing manager to form detailed images.   MRI images may provide more details about a medical condition than X-rays, CT scans, and ultrasounds can provide.  You may be given earphones to listen for instructions.  You may eat a light breakfast and take medications as ordered with the exception of furosemide, hydrochlorothiazide, chlorthalidone or spironolactone  (or any other fluid pill). If you are undergoing a stress MRI, please avoid stimulants for 12 hr prior to test. (I.e. Caffeine, nicotine , chocolate, or antihistamine medications)  An  IV will be inserted into one of your veins. Contrast material will be injected into your IV. It will leave your body through your urine within a day. You may be told to drink plenty of fluids to help flush the contrast material out of your system.  You will be asked to remove all metal, including: Watch, jewelry, and other metal objects including hearing aids, hair pieces and dentures. Also wearable glucose monitoring systems (ie. Freestyle Libre and Omnipods) (Braces and fillings normally are not a problem.)   TEST WILL TAKE APPROXIMATELY 1 HOUR  PLEASE NOTIFY SCHEDULING AT LEAST 24 HOURS IN ADVANCE IF YOU ARE UNABLE TO KEEP YOUR APPOINTMENT. [phone]  For more information and frequently asked questions, please visit our website : [email or web address]  Please call the Cardiac Imaging Nurse Navigators with any questions/concerns. [phone] Office   Follow-Up: At [facility], you and your health needs are our priority.  As part of our continuing mission to provide you with exceptional heart care, we have created designated Provider Care Teams.  These Care Teams include your primary Cardiologist (physician) and Advanced Practice Providers (APPs -  Physician Assistants and Nurse Practitioners) who all work together to provide you with the care you need, when you need it.    Your next appointment:   3 month(s)  Provider:   You may see Alm Clay, MD or one of the following Advanced Practice Providers on your designated Care Team:   Bernardino Bring, PA-C

## 2023-10-19 ENCOUNTER — Ambulatory Visit: Payer: BC Managed Care – PPO | Admitting: Family

## 2023-10-19 ENCOUNTER — Other Ambulatory Visit: Payer: Self-pay

## 2023-10-19 ENCOUNTER — Encounter: Payer: Self-pay | Admitting: Family

## 2023-10-19 VITALS — BP 130/82 | HR 76 | Temp 97.9°F | Ht 74.0 in | Wt 203.0 lb

## 2023-10-19 DIAGNOSIS — I429 Cardiomyopathy, unspecified: Secondary | ICD-10-CM | POA: Diagnosis not present

## 2023-10-19 DIAGNOSIS — F325 Major depressive disorder, single episode, in full remission: Secondary | ICD-10-CM

## 2023-10-19 DIAGNOSIS — I255 Ischemic cardiomyopathy: Secondary | ICD-10-CM

## 2023-10-19 DIAGNOSIS — R5383 Other fatigue: Secondary | ICD-10-CM | POA: Diagnosis not present

## 2023-10-19 DIAGNOSIS — I10 Essential (primary) hypertension: Secondary | ICD-10-CM

## 2023-10-19 DIAGNOSIS — Z122 Encounter for screening for malignant neoplasm of respiratory organs: Secondary | ICD-10-CM

## 2023-10-19 DIAGNOSIS — F900 Attention-deficit hyperactivity disorder, predominantly inattentive type: Secondary | ICD-10-CM

## 2023-10-19 DIAGNOSIS — F909 Attention-deficit hyperactivity disorder, unspecified type: Secondary | ICD-10-CM

## 2023-10-19 DIAGNOSIS — L603 Nail dystrophy: Secondary | ICD-10-CM | POA: Insufficient documentation

## 2023-10-19 MED ORDER — TRIAMCINOLONE ACETONIDE 0.5 % EX OINT
1.0000 | TOPICAL_OINTMENT | Freq: Two times a day (BID) | CUTANEOUS | 2 refills | Status: AC
  Filled 2023-10-19: qty 15, 15d supply, fill #0

## 2023-10-19 MED ORDER — AMPHETAMINE-DEXTROAMPHETAMINE 20 MG PO TABS
20.0000 mg | ORAL_TABLET | Freq: Two times a day (BID) | ORAL | 0 refills | Status: DC
  Filled 2023-10-19: qty 60, 30d supply, fill #0

## 2023-10-19 MED ORDER — SERTRALINE HCL 25 MG PO TABS
25.0000 mg | ORAL_TABLET | Freq: Every day | ORAL | 3 refills | Status: DC
  Filled 2023-10-19: qty 30, 30d supply, fill #0
  Filled 2023-12-21 (×2): qty 30, 30d supply, fill #1

## 2023-10-19 MED ORDER — METOPROLOL SUCCINATE ER 50 MG PO TB24: 75.0000 mg | ORAL_TABLET | Freq: Every day | ORAL | Status: DC

## 2023-10-19 NOTE — Assessment & Plan Note (Addendum)
 Chronic well-controlled on Adderall  20 mg twice daily.  Long discussion as it relates to cardiomyopathy, PVCs.  Upcoming visit with EP.  Note sent to Dr. Addie Holstein, Varney Gentleman in regards to PVCs and Adderall  usage.  Patient strongly feels benefit of medication outweighs cardiovascular risk.

## 2023-10-19 NOTE — Assessment & Plan Note (Signed)
 Compliant with losartan  25 mg daily, Toprol  25 mg daily.  Message sent to Dr. Addie Holstein, Varney Gentleman in regards to PVCs and usage of Adderall .

## 2023-10-19 NOTE — Assessment & Plan Note (Signed)
 Discussed with patient that I suspect fatigue is multifactorial.  Encouraged him to reach back out to cardiology to start cardiac rehab.  No current exercise program at this time .  we also discussed sleep not being restorative.  Pursuing sleep study through pulmonology.  Discussed suboptimal control of depression as well.

## 2023-10-19 NOTE — Progress Notes (Signed)
Assessment & Plan:  Other fatigue Assessment & Plan: Discussed with patient that I suspect fatigue is multifactorial.  Encouraged him to reach back out to cardiology to start cardiac rehab.  No current exercise program at this time .  we also discussed sleep not being restorative.  Pursuing sleep study through pulmonology.  Discussed suboptimal control of depression as well.   Orders: -     Ambulatory referral to Pulmonology -     B12 and Folate Panel  Essential hypertension -     Metoprolol Succinate ER; Take 1.5 tablets (75 mg total) by mouth daily. Take with or immediately following a meal.  Cardiomyopathy, unspecified type (HCC) -     Metoprolol Succinate ER; Take 1.5 tablets (75 mg total) by mouth daily. Take with or immediately following a meal.  Attention deficit hyperactivity disorder (ADHD), unspecified ADHD type Assessment & Plan: Chronic well-controlled on Adderall 20 mg twice daily.  Long discussion as it relates to cardiomyopathy, PVCs.  Upcoming visit with EP.  Note sent to Dr. Herbie Baltimore, Eula Listen in regards to PVCs and Adderall usage.  Patient strongly feels benefit of medication outweighs cardiovascular risk.    Attention deficit hyperactivity disorder (ADHD), predominantly inattentive type Assessment & Plan: Chronic well-controlled on Adderall 20 mg twice daily.  Long discussion as it relates to cardiomyopathy, PVCs.  Upcoming visit with EP.  Note sent to Dr. Herbie Baltimore, Eula Listen in regards to PVCs and Adderall usage.  Patient strongly feels benefit of medication outweighs cardiovascular risk.   Orders: -     Amphetamine-Dextroamphetamine; Take 1 tablet (20 mg total) by mouth 2 (two) times daily.  Dispense: 60 tablet; Refill: 0  Thin nails Assessment & Plan: Question if nutritional deficiency.  Pending B12, ferritin. Advised to start over-the-counter hair skin and nails supplement.  Provided with triamcinolone ointment to use for short period of time in combination  with emollient for dryness.  Encouraged hand mask at night  Orders: -     Triamcinolone Acetonide; Apply 1 Application topically 2 (two) times daily. Use sparingly for < 1 week on hands with vaseline  Dispense: 15 g; Refill: 2 -     Iron, TIBC and Ferritin Panel -     B12 and Folate Panel -     VITAMIN D 25 Hydroxy (Vit-D Deficiency, Fractures)  Screening for lung cancer -     Ambulatory Referral for Lung Cancer Scre  Major depressive disorder with single episode, in full remission [facility]) Assessment & Plan: Suboptimal control.  Increase Zoloft to 125 mg nightly.  Discussed trial of Prozac in lieu of Zoloft as more activating  Orders: -     Sertraline HCl; Take 1 tablet (25 mg total) by mouth daily.  Dispense: 30 tablet; Refill: 3  Ischemic cardiomyopathy Assessment & Plan: Compliant with losartan 25 mg daily, Toprol 25 mg daily.  Message sent to Dr. Herbie Baltimore, Eula Listen in regards to PVCs and usage of Adderall.      Return precautions given.   Risks, benefits, and alternatives of the medications and treatment plan prescribed today were discussed, and patient expressed understanding.   Education regarding symptom management and diagnosis given to patient on AVS either electronically or printed.  No follow-ups on file.  Rennie Plowman, FNP I have spent 35 minutes with a patient including precharting, exam, collaborating with cardiology ,reviewing medical records, and discussion plan of care.     Subjective:    Patient ID: Shane [name], male    DOB:  [birth year], 52 y.o.   MRN: [id number]  CC: Shane [name] is a 53 y.o. male who presents today for follow up.   HPI: Complains of fatigue, sleeping too much lately .  Sleeps well at night.  Sleep is not restorative.  Snores.      Denies HA, SOB, CP  No formal exercise.  When he does exercise, he notes that exercise is helpful for energy.  He had been referred to cardiac rehab in 2023 after his heart attack however  did not go.  He takes adderall 20mg  BID on work days only , 4 days per week.  Adderall is very helpful for concentration and completion of task.  Denies palpitations chest pain while taking medication Adderall helps depression as he will focus on the past when he is not taking adderall.   Remains compliant with Zoloft 100 mg.  This medication has been very helpful for him.  He takes Remeron on occasion.  He is interested in increasing Zoloft.  Complains of flakely nails and dry skin of nails.  He is picking at them.    Former smoker   Follow-up cardiology yesterday regards to CAD.  Reduced brillanta.  Continue aspirin for another 12 months.  Continue Toprol and rosuvastatin.  Refer to EP for further management of frequent PVCs Pending MRI cardiac   Allergies: Patient has no known allergies. Current Outpatient Medications on File Prior to Visit  Medication Sig Dispense Refill   aspirin 81 MG chewable tablet Chew 1 tablet (81 mg total) by mouth daily. 30 tablet 1   losartan (COZAAR) 25 MG tablet Take 0.5 tablets (12.5 mg total) by mouth daily for 7 days, THEN 1 tablet (25 mg total) daily. (Patient taking differently: 25 mg total) daily.) 90 tablet 3   mirtazapine (REMERON) 15 MG tablet Take 1 tablet (15 mg total) by mouth at bedtime. (Patient taking differently: Take 15 mg by mouth at bedtime as needed (Sleep).) 90 tablet 3   rosuvastatin (CRESTOR) 40 MG tablet Take 1 tablet (40 mg total) by mouth daily. 30 tablet 6   sertraline (ZOLOFT) 100 MG tablet Take 1 tablet (100 mg total) by mouth daily. 90 tablet 3   ticagrelor (BRILINTA) 60 MG TABS tablet Take 1 tablet (60 mg total) by mouth 2 (two) times daily. 60 tablet 11   [DISCONTINUED] dapagliflozin propanediol (FARXIGA) 10 MG TABS tablet Take 1 tablet (10 mg total) by mouth daily. 30 tablet 2   No current facility-administered medications on file prior to visit.    Review of Systems  Constitutional:  Positive for fatigue. Negative for  chills and fever.  Respiratory:  Negative for cough.   Cardiovascular:  Negative for chest pain and palpitations.  Gastrointestinal:  Negative for nausea and vomiting.  Psychiatric/Behavioral:  Negative for sleep disturbance. The patient is nervous/anxious.       Objective:    BP 130/82   Pulse 76   Temp 97.9 F (36.6 C) (Oral)   Ht 6\' 2"  (1.88 m)   Wt 203 lb (92.1 kg)   SpO2 98%   BMI 26.06 kg/m  BP Readings from Last 3 Encounters:  10/19/23 130/82  10/18/23 124/89  09/15/23 113/76   Wt Readings from Last 3 Encounters:  10/19/23 203 lb (92.1 kg)  10/18/23 200 lb 6.4 oz (90.9 kg)  09/15/23 201 lb 8 oz (91.4 kg)      10/19/2023   10:44 AM 06/27/2023    9:12 AM 03/22/2023    9:06 AM  Depression screen PHQ 2/9  Decreased Interest 0 0 0  Down, Depressed, Hopeless 0 0 0  PHQ - 2 Score 0 0 0  Altered sleeping   0  Tired, decreased energy   0  Change in appetite   0  Feeling bad or failure about yourself    0  Trouble concentrating   0  Moving slowly or fidgety/restless   0  Suicidal thoughts   0  PHQ-9 Score   0  Difficult doing work/chores   Not difficult at all     Physical Exam Vitals reviewed.  Constitutional:      Appearance: He is well-developed.  Cardiovascular:     Rate and Rhythm: Regular rhythm.     Heart sounds: Normal heart sounds.  Pulmonary:     Effort: Pulmonary effort is normal. No respiratory distress.     Breath sounds: Normal breath sounds. No wheezing, rhonchi or rales.  Skin:    General: Skin is warm and dry.     Comments: Brittle broken nailbeds.  Excoriations around.  Very dry skin around nail neds.   Neurological:     Mental Status: He is alert.  Psychiatric:        Speech: Speech normal.        Behavior: Behavior normal.

## 2023-10-19 NOTE — Patient Instructions (Addendum)
 Please send note to Dr Arleene Lack in regards to completing Cardiac rehab. I think this would be invaluable.   Referral to pulmonology for sleep apnea evaluation and for lung cancer screening program.   Let us  know if you dont hear back within a week in regards to an appointment being scheduled.   So that you are aware, if you are [facility] MyChart user , please pay attention to your MyChart messages as you may receive a MyChart message with a phone number to call and schedule this test/appointment own your own from our referral coordinator. This is a new process so I do not want you to miss this message.  If you are not a MyChart user, you will receive a phone call.     Trial hand mask as discussed   Start hair skin and nails supplement  Increase zoloft  to 125mg  nightly

## 2023-10-19 NOTE — Assessment & Plan Note (Signed)
 Question if nutritional deficiency.  Pending B12, ferritin. Advised to start over-the-counter hair skin and nails supplement.  Provided with triamcinolone  ointment to use for short period of time in combination with emollient for dryness.  Encouraged hand mask at night

## 2023-10-19 NOTE — Assessment & Plan Note (Signed)
 Suboptimal control.  Increase Zoloft  to 125 mg nightly.  Discussed trial of Prozac in lieu of Zoloft  as more activating

## 2023-10-24 ENCOUNTER — Encounter: Payer: Self-pay | Admitting: Family

## 2023-11-17 NOTE — Telephone Encounter (Signed)
Call patient Please review unread MyChart message with patient.

## 2023-11-18 ENCOUNTER — Other Ambulatory Visit: Payer: Self-pay

## 2023-11-18 ENCOUNTER — Telehealth: Payer: Self-pay

## 2023-11-18 NOTE — Telephone Encounter (Signed)
Copied from CRM [id number]. Topic: Clinical - Request for Lab/Test Order >> Nov 18, 2023  1:52 PM Irine Seal wrote: Reason for CRM: Wes- [facility] [place]- calling in regards to lab orders  CBC (Order [id number]) Magnesium (Order [id number]) TSH (Order [id number]) Iron, TIBC and Ferritin Panel (Order [id number]) B12 and Folate Panel (Order [id number]) VITAMIN D 25 Hydroxy (Vit-D Deficiency, Fractures) (Order [id number])  He stated that they do onsite labs for no cost to the patient, and they received those orders as a [facility] Req, and he is needing those faxed over as an actual order instead fax number [phone]

## 2023-11-18 NOTE — Telephone Encounter (Signed)
Spoke to pt he stated that I can call [employer] @ [facility] @ [phone]... fax # is [phone] on Monday to inquire as to how they need labs to be ordered

## 2023-11-21 ENCOUNTER — Other Ambulatory Visit: Payer: Self-pay

## 2023-11-21 ENCOUNTER — Other Ambulatory Visit: Payer: Self-pay | Admitting: Emergency Medicine

## 2023-11-21 ENCOUNTER — Telehealth: Payer: Self-pay | Admitting: Physician Assistant

## 2023-11-21 ENCOUNTER — Telehealth: Payer: Self-pay

## 2023-11-21 DIAGNOSIS — E785 Hyperlipidemia, unspecified: Secondary | ICD-10-CM

## 2023-11-21 DIAGNOSIS — I493 Ventricular premature depolarization: Secondary | ICD-10-CM

## 2023-11-21 DIAGNOSIS — Z79899 Other long term (current) drug therapy: Secondary | ICD-10-CM

## 2023-11-21 DIAGNOSIS — I10 Essential (primary) hypertension: Secondary | ICD-10-CM

## 2023-11-21 DIAGNOSIS — I2510 Atherosclerotic heart disease of native coronary artery without angina pectoris: Secondary | ICD-10-CM

## 2023-11-21 NOTE — Telephone Encounter (Signed)
Copied from CRM [id number]. Topic: Clinical - Request for Lab/Test Order >> Nov 21, 2023 10:03 AM Denese Killings wrote: Reason for CRM: [facility] at [place] stated that patient brought him a [employer] requsition and they need an order for them not [facility].

## 2023-11-21 NOTE — Telephone Encounter (Signed)
Spoke to [employer] from [employer]. He can do the labs for the patient for free through his work place. He asked that orders be faxed to [phone] without [facility] on them.  Call placed to the patient for verification. Left a message for him to call back.

## 2023-11-21 NOTE — Telephone Encounter (Signed)
Wes from [facility] is requesting to have the 4 lab orders made by Eula Listen to be faxed over to them at [phone] without [facility] being on it due to pt being able to have it done free through his Employer. Please advise.

## 2023-11-21 NOTE — Telephone Encounter (Signed)
Spoke to pt regarding my chart results see previous note from 11/21/23

## 2023-11-21 NOTE — Telephone Encounter (Signed)
Spoke to pt went over results in full detail, pt verbalized and understands that he needs to NOT take the Adderral and to reach back out to [place] if he starts to feel different and then we can increase the Zoloft

## 2023-11-21 NOTE — Telephone Encounter (Signed)
Spoke to the patient. He stated that he would like the labs signed and sent for him. Orders given to Ryan's nurse to be signed and faxed.

## 2023-11-21 NOTE — Telephone Encounter (Signed)
Call pt  Please review MyChart message from 10/24/23  Pt has not read

## 2023-11-21 NOTE — Telephone Encounter (Signed)
Called [facility] today and spoke to Clinton that stated that he would give message to [facility] regarding what labs needed to be ordered for pt

## 2023-11-21 NOTE — Telephone Encounter (Signed)
[facility] [place] [phone]  and spoke to Jasper and he took message to have Beaulah Dinning  to give me a call back in order to inquire about labs needed for pt. I provided my name and phone # for him to contact  me back

## 2023-11-22 ENCOUNTER — Telehealth: Payer: Self-pay

## 2023-11-22 ENCOUNTER — Other Ambulatory Visit: Payer: Self-pay

## 2023-11-22 DIAGNOSIS — I10 Essential (primary) hypertension: Secondary | ICD-10-CM

## 2023-11-22 NOTE — Telephone Encounter (Signed)
Copied from CRM [id number]. Topic: General - Other >> Nov 22, 2023 10:01 AM Elizebeth Brooking wrote: Reason for CRM: Patient called in regarding missing a call from someone at the office, is requesting to give them a callback

## 2023-11-22 NOTE — Telephone Encounter (Signed)
Copied from CRM [id number]. Topic: General - Other >> Nov 22, 2023  8:46 AM Sim Boast F wrote: Reason for CRM: *2ND Request* Patient has an appointment TODAY 2/18 @ 11am this morning @ [facility], [place] called and he needs the lab orders FAXED over asap to [phone].   CBC  Magnesium  TSH  Iron, TIBC and Ferritin Panel  B12 and Folate Panel  VITAMIN D 25 Hydroxy (Vit-D Deficiency, Fractures)

## 2023-11-22 NOTE — Telephone Encounter (Signed)
Spoke to pt regarding his labs

## 2023-11-22 NOTE — Telephone Encounter (Signed)
 See previous note

## 2023-11-22 NOTE — Telephone Encounter (Signed)
FAXED lab orders over to fax #[phone]  received confirmation that they were received

## 2023-11-22 NOTE — Telephone Encounter (Signed)
LVM to call back to schedule either a VV or an OV to discuss medication changes. Please schedule when pt calls back to office

## 2023-11-24 LAB — LAB REPORT - SCANNED: EGFR: 85

## 2023-11-25 NOTE — Telephone Encounter (Signed)
Spoke to pt he stated that he will call back on Mon 11/29/23 to schedule and ov to discuss changes to Medication. Pt was driving and could not check his calendar.

## 2023-11-28 ENCOUNTER — Encounter: Payer: Self-pay | Admitting: Family

## 2023-11-28 ENCOUNTER — Other Ambulatory Visit: Payer: Self-pay | Admitting: Family

## 2023-11-28 DIAGNOSIS — E538 Deficiency of other specified B group vitamins: Secondary | ICD-10-CM

## 2023-12-03 HISTORY — PX: OTHER SURGICAL HISTORY: SHX169

## 2023-12-19 ENCOUNTER — Encounter (HOSPITAL_COMMUNITY): Payer: Self-pay

## 2023-12-21 ENCOUNTER — Ambulatory Visit: Admitting: Family

## 2023-12-21 ENCOUNTER — Other Ambulatory Visit: Payer: Self-pay | Admitting: Emergency Medicine

## 2023-12-21 ENCOUNTER — Encounter: Payer: Self-pay | Admitting: Family

## 2023-12-21 ENCOUNTER — Other Ambulatory Visit: Payer: Self-pay

## 2023-12-21 ENCOUNTER — Other Ambulatory Visit: Payer: Self-pay | Admitting: Physician Assistant

## 2023-12-21 ENCOUNTER — Ambulatory Visit
Admission: RE | Admit: 2023-12-21 | Discharge: 2023-12-21 | Disposition: A | Discharge status: Home / Self Care | Payer: BC Managed Care – PPO | Source: Ambulatory Visit | Attending: Physician Assistant | Admitting: Physician Assistant

## 2023-12-21 VITALS — BP 138/84 | HR 110 | Temp 98.1°F | Ht 74.0 in | Wt 203.2 lb

## 2023-12-21 DIAGNOSIS — Z136 Encounter for screening for cardiovascular disorders: Secondary | ICD-10-CM | POA: Diagnosis not present

## 2023-12-21 DIAGNOSIS — F325 Major depressive disorder, single episode, in full remission: Secondary | ICD-10-CM

## 2023-12-21 DIAGNOSIS — E538 Deficiency of other specified B group vitamins: Secondary | ICD-10-CM | POA: Diagnosis not present

## 2023-12-21 DIAGNOSIS — I4729 Other ventricular tachycardia: Secondary | ICD-10-CM

## 2023-12-21 DIAGNOSIS — I10 Essential (primary) hypertension: Secondary | ICD-10-CM

## 2023-12-21 DIAGNOSIS — F32A Depression, unspecified: Secondary | ICD-10-CM

## 2023-12-21 DIAGNOSIS — F419 Anxiety disorder, unspecified: Secondary | ICD-10-CM

## 2023-12-21 DIAGNOSIS — I5042 Chronic combined systolic (congestive) and diastolic (congestive) heart failure: Secondary | ICD-10-CM

## 2023-12-21 DIAGNOSIS — Z13220 Encounter for screening for lipoid disorders: Secondary | ICD-10-CM | POA: Diagnosis not present

## 2023-12-21 LAB — CBC WITH DIFFERENTIAL/PLATELET
Basophils Absolute: 0 10*3/uL (ref 0.0–0.1)
Basophils Relative: 0.5 % (ref 0.0–3.0)
Eosinophils Absolute: 0.1 10*3/uL (ref 0.0–0.7)
Eosinophils Relative: 2.5 % (ref 0.0–5.0)
HCT: 46.4 % (ref 39.0–52.0)
Hemoglobin: 15.8 g/dL (ref 13.0–17.0)
Lymphocytes Relative: 18.3 % (ref 12.0–46.0)
Lymphs Abs: 1 10*3/uL (ref 0.7–4.0)
MCHC: 34.1 g/dL (ref 30.0–36.0)
MCV: 97.7 fl (ref 78.0–100.0)
Monocytes Absolute: 0.3 10*3/uL (ref 0.1–1.0)
Monocytes Relative: 4.9 % (ref 3.0–12.0)
Neutro Abs: 4.1 10*3/uL (ref 1.4–7.7)
Neutrophils Relative %: 73.8 % (ref 43.0–77.0)
Platelets: 260 10*3/uL (ref 150.0–400.0)
RBC: 4.76 Mil/uL (ref 4.22–5.81)
RDW: 13.4 % (ref 11.5–15.5)
WBC: 5.6 10*3/uL (ref 4.0–10.5)

## 2023-12-21 LAB — B12 AND FOLATE PANEL
Folate: 7.7 ng/mL (ref >5.9)
Vitamin B-12: 344 pg/mL (ref 211–911)

## 2023-12-21 LAB — MAGNESIUM: Magnesium: 2.1 mg/dL (ref 1.5–2.5)

## 2023-12-21 LAB — TSH: TSH: 1.5 u[IU]/mL (ref 0.35–5.50)

## 2023-12-21 LAB — VITAMIN D 25 HYDROXY (VIT D DEFICIENCY, FRACTURES): VITD: 15.02 ng/mL — ABNORMAL LOW (ref 30.00–100.00)

## 2023-12-21 MED ORDER — BUPROPION HCL ER (XL) 150 MG PO TB24
ORAL_TABLET | ORAL | 1 refills | Status: DC
  Filled 2023-12-21: qty 57, 30d supply, fill #0
  Filled 2024-01-27: qty 57, 28d supply, fill #1
  Filled 2024-03-28: qty 57, 28d supply, fill #2

## 2023-12-21 MED ORDER — GADOBUTROL 1 MMOL/ML IV SOLN
12.0000 mL | Freq: Once | INTRAVENOUS | Status: AC | PRN
  Administered 2023-12-21: 12 mL via INTRAVENOUS

## 2023-12-21 MED ORDER — BUSPIRONE HCL 5 MG PO TABS
5.0000 mg | ORAL_TABLET | Freq: Three times a day (TID) | ORAL | 2 refills | Status: DC
  Filled 2023-12-21: qty 90, 30d supply, fill #0
  Filled 2024-02-22: qty 90, 30d supply, fill #1
  Filled 2024-04-30: qty 90, 30d supply, fill #2

## 2023-12-21 NOTE — Progress Notes (Signed)
 Assessment & Plan:  Major depressive disorder with single episode, in full remission [facility]) Assessment & Plan: Severely uncontrolled. Anxious and tearful today.  Adamantly denies any thoughts of self-harm, hurting anyone else. We had long discussion in regards to history of PVCs, cardiomyopathy.  Upcoming appointment with Dr. Lalla Brothers.  We jointly agreed that risk of stimulant medication such as Adderall does not outweigh benefit at this time.  We discussed use of Wellbutrin 150 mg to adjunct Zoloft 125mg  in the setting of depression and to aid in concentration.  Discussed Wellbutrin  in the setting of smoking cessation. Start BuSpar 5 mg 3 times daily.  We discussed consideration for decreasing to Zoloft 100 mg as it may be too sedating for him.  Close follow-up.  Patient will let me know how he  is doing.  He politely declines counseling referral at this time.   Orders: -     busPIRone HCl; Take 1 tablet (5 mg total) by mouth 3 (three) times daily.  Dispense: 90 tablet; Refill: 2 -     buPROPion HCl ER (XL); Take 1 tablet (150 mg total) by mouth every morning for 3 days, THEN 2 tablets (300 mg total) every morning.  Dispense: 90 tablet; Refill: 1  B12 deficiency -     Celiac Disease Ab Screen w/Rfx -     Methylmalonic acid, serum -     Homocysteine -     Intrinsic Factor Antibodies  Essential hypertension -     VITAMIN D 25 Hydroxy (Vit-D Deficiency, Fractures) -     B12 and Folate Panel -     Iron, TIBC and Ferritin Panel -     TSH -     CBC with Differential/Platelet -     Magnesium  Encounter for lipid screening for cardiovascular disease -     Lipid panel; Future  Anxiety and depression Assessment & Plan: Severely uncontrolled. Anxious and tearful today.  Adamantly denies any thoughts of self-harm, hurting anyone else. We had long discussion in regards to history of PVCs, cardiomyopathy.  Upcoming appointment with Dr. Lalla Brothers.  We jointly agreed that risk of stimulant medication  such as Adderall does not outweigh benefit at this time.  We discussed use of Wellbutrin 150 mg to adjunct Zoloft 125mg  in the setting of depression and to aid in concentration.  Discussed Wellbutrin  in the setting of smoking cessation. Start BuSpar 5 mg 3 times daily.  We discussed consideration for decreasing to Zoloft 100 mg as it may be too sedating for him.  Close follow-up.  Patient will let me know how he  is doing.  He politely declines counseling referral at this time.       Return precautions given.   Risks, benefits, and alternatives of the medications and treatment plan prescribed today were discussed, and patient expressed understanding.   Education regarding symptom management and diagnosis given to patient on AVS either electronically or printed. I have spent 30 minutes with a patient including precharting, exam, reviewing medical records, and discussion plan of care.     Return in about 2 weeks (around 01/04/2024).  Rennie Plowman, FNP  Subjective:    Patient ID: Shane [name], male    DOB: April 24, 1971, 53 y.o.   MRN: [id number]  CC: Shane [name] is a 53 y.o. male who presents today for an acute visit/work in visit  Patient walked into clinic after MRI today.    HPI: Complains of worsening depression, anxiety , concentration since  abrupt cessation of adderall.   He is sleeping a lot.   Endorses decreased motivation.     Compliant with Zoloft 125 mg . He is not using remeron.   Previously been on Wellbutrin   Denies CP, palpitations.   He denies suicidal ideation, suicidal plan.  He has never had a plan to harm himself or anyone else.  He lives for his 2 daughters.  He does not keep guns in his home.  He is close to his parents and confides in them.  denies a period of having more energy than usual, didn't require sleep, spending more money than usual and got into trouble.  No h/o illicit drug use. H/o alcohol abuse.    Last seen by psychiatry  12/21/2019    No h/o seizure.   He may have 1-2 alcoholic beverages in one sitting. He feels in control of alcohol use.     Allergies: Patient has no known allergies. Current Outpatient Medications on File Prior to Visit  Medication Sig Dispense Refill   aspirin 81 MG chewable tablet Chew 1 tablet (81 mg total) by mouth daily. 30 tablet 1   losartan (COZAAR) 25 MG tablet Take 0.5 tablets (12.5 mg total) by mouth daily for 7 days, THEN 1 tablet (25 mg total) daily. (Patient taking differently: 25 mg total) daily.) 90 tablet 3   metoprolol succinate (TOPROL-XL) 50 MG 24 hr tablet Take 1.5 tablets (75 mg total) by mouth daily. Take with or immediately following a meal.     mirtazapine (REMERON) 15 MG tablet Take 1 tablet (15 mg total) by mouth at bedtime. (Patient taking differently: Take 15 mg by mouth at bedtime as needed (Sleep).) 90 tablet 3   rosuvastatin (CRESTOR) 40 MG tablet Take 1 tablet (40 mg total) by mouth daily. 30 tablet 6   sertraline (ZOLOFT) 100 MG tablet Take 1 tablet (100 mg total) by mouth daily. 90 tablet 3   sertraline (ZOLOFT) 25 MG tablet Take 1 tablet (25 mg total) by mouth daily. 30 tablet 3   ticagrelor (BRILINTA) 60 MG TABS tablet Take 1 tablet (60 mg total) by mouth 2 (two) times daily. 60 tablet 11   triamcinolone ointment (KENALOG) 0.5 % Apply 1 Application topically 2 (two) times daily. Use sparingly for < 1 week on hands with vaseline 15 g 2   amphetamine-dextroamphetamine (ADDERALL) 20 MG tablet Take 1 tablet (20 mg total) by mouth 2 (two) times daily. (Patient not taking: Reported on 12/21/2023) 60 tablet 0   [DISCONTINUED] dapagliflozin propanediol (FARXIGA) 10 MG TABS tablet Take 1 tablet (10 mg total) by mouth daily. 30 tablet 2   No current facility-administered medications on file prior to visit.    Review of Systems  Constitutional:  Negative for chills and fever.  Respiratory:  Negative for cough.   Cardiovascular:  Negative for chest pain and  palpitations.  Gastrointestinal:  Negative for nausea and vomiting.  Psychiatric/Behavioral:  Positive for decreased concentration and sleep disturbance. Negative for dysphoric mood, self-injury and suicidal ideas. The patient is nervous/anxious.       Objective:    BP 138/84   Pulse (!) 110   Temp 98.1 F (36.7 C) (Oral)   Ht 6\' 2"  (1.88 m)   Wt 203 lb 3.2 oz (92.2 kg)   SpO2 95%   BMI 26.09 kg/m   BP Readings from Last 3 Encounters:  12/21/23 138/84  10/19/23 130/82  10/18/23 124/89   Wt Readings from Last 3 Encounters:  12/21/23  203 lb 3.2 oz (92.2 kg)  10/19/23 203 lb (92.1 kg)  12/21/23 203 lb (92.1 kg)    Physical Exam Vitals reviewed.  Constitutional:      Appearance: He is well-developed.  Cardiovascular:     Rate and Rhythm: Regular rhythm.     Heart sounds: Normal heart sounds.  Pulmonary:     Effort: Pulmonary effort is normal. No respiratory distress.     Breath sounds: Normal breath sounds. No wheezing, rhonchi or rales.  Skin:    General: Skin is warm and dry.  Neurological:     Mental Status: He is alert.  Psychiatric:        Attention and Perception: Attention normal. He is attentive.        Mood and Affect: Affect is not angry.        Speech: Speech normal.        Behavior: Behavior normal.     Comments: Tearful.

## 2023-12-21 NOTE — Telephone Encounter (Signed)
 LVM to call back to speak with Loletta Specter per Claris Che

## 2023-12-21 NOTE — Patient Instructions (Signed)
 Start BuSpar 5 mg schedule 3 times daily for breakthrough anxiety.  Start Wellbutrin 150 mg in the morning.  Take with food.  Please monitor for increased anxiety, irritability on Wellbutrin.  We discussed today starting medication called Wellbutrin.  As also discussed, you must limit alcohol on this medication as alcohol and Wellbutrin together may increase your risk for seizure.  You may drink no more than 1 alcoholic beverage on this medication.  A standard drink is 12 ounces of regular beer, which is usually about 5% alcohol OR 5 ounces of wine, which is typically about 12% alcohol OR   1.5 ounces of distilled spirits, which is about 40% alcohol     Please continue Zoloft 125 mg daily.  May consider decreasing  Please let me know how you are doing

## 2023-12-21 NOTE — Assessment & Plan Note (Addendum)
 Severely uncontrolled. Anxious and tearful today.  Adamantly denies any thoughts of self-harm, hurting anyone else. We had long discussion in regards to history of PVCs, cardiomyopathy.  Upcoming appointment with Dr. Lalla Brothers.  We jointly agreed that risk of stimulant medication such as Adderall does not outweigh benefit at this time.  We discussed use of Wellbutrin 150 mg to adjunct Zoloft 125mg  in the setting of depression and to aid in concentration.  Discussed Wellbutrin  in the setting of smoking cessation. Start BuSpar 5 mg 3 times daily.  We discussed consideration for decreasing to Zoloft 100 mg as it may be too sedating for him.  Close follow-up.  Patient will let me know how he  is doing.  He politely declines counseling referral at this time.

## 2023-12-22 LAB — IRON,TIBC AND FERRITIN PANEL
%SAT: 27 % (ref 20–48)
Ferritin: 168 ng/mL (ref 38–380)
Iron: 109 ug/dL (ref 50–180)
TIBC: 398 ug/dL (ref 250–425)

## 2023-12-25 ENCOUNTER — Telehealth: Payer: Self-pay | Admitting: Family

## 2023-12-25 LAB — INTRINSIC FACTOR ANTIBODIES: Intrinsic Factor Abs, Serum: 1 [AU]/ml (ref 0.0–1.1)

## 2023-12-25 LAB — HOMOCYSTEINE: Homocysteine: 16.8 umol/L — ABNORMAL HIGH (ref 0.0–14.5)

## 2023-12-25 LAB — METHYLMALONIC ACID, SERUM: Methylmalonic Acid: 210 nmol/L (ref 0–378)

## 2023-12-25 NOTE — Telephone Encounter (Signed)
 Can lipid and celiac lab be added to labs? Not collected last week

## 2023-12-26 LAB — LIPID PANEL

## 2023-12-26 NOTE — Addendum Note (Signed)
 Addended by: Warden Fillers on: 12/26/2023 12:33 PM   Modules accepted: Orders

## 2023-12-26 NOTE — Addendum Note (Signed)
 Addended by: Warden Fillers on: 12/26/2023 12:34 PM   Modules accepted: Orders

## 2023-12-26 NOTE — Telephone Encounter (Signed)
 I have submitted a add-on request via [facility]. The Celiac was ordered wrong and the lipid was missed. I changes both to [facility] because they have a 7 day stability timeframe. If they have enough blood sample, they will run it.

## 2023-12-28 ENCOUNTER — Encounter: Payer: Self-pay | Admitting: Family

## 2023-12-28 LAB — LIPID PANEL
Cholesterol, Total: 186 mg/dL (ref 100–199)
HDL: 52 mg/dL (ref >39)
LDL CALC COMMENT:: 3.6 ratio (ref 0.0–5.0)
LDL Chol Calc (NIH): 95 mg/dL (ref 0–99)
Triglycerides: 231 mg/dL — ABNORMAL HIGH (ref 0–149)
VLDL Cholesterol Cal: 39 mg/dL (ref 5–40)

## 2023-12-28 LAB — CELIAC DISEASE AB SCREEN W/RFX
Antigliadin Abs, IgA: 71 U — ABNORMAL HIGH (ref 0–19)
IgA/Immunoglobulin A, Serum: 582 mg/dL — ABNORMAL HIGH (ref 90–386)

## 2023-12-28 LAB — SPECIMEN STATUS REPORT

## 2024-01-03 NOTE — Progress Notes (Unsigned)
 Electrophysiology Office Note:    Date:  01/04/2024   ID:  Shane [name], DOB [birth year], MRN [id number]  [facility] Cardiologist:  Shane Lemma, MD  [facility] Electrophysiologist:  Shane Prude, MD   Referring MD: Shane Barges, PA-C   Chief Complaint: PVCs  History of Present Illness:    Shane [name] is a 53 year old man who I am seeing today for an evaluation of frequent PVCs at the request of Shane Listen, PA-C.  The patient has a history of coronary artery disease with prior STEMI in 2023.  He has chronic systolic heart failure, frequent PVCs, hypertension, hyperlipidemia, ADD, anxiety, tobacco use.  His EF at the time of his stenting was 35 to 40%.  At the appointment with Shane [name] in January he was doing well without angina.  He is with his mother today in clinic. He reports fatigue and sleeping all the time. Some of it is related to depression. Recently stopped his Adderall. No syncope. Feels lightheaded when he stands quickly. No edema.      Their past medical, social and family history was reviewed.   ROS:   Please see the history of present illness.    All other systems reviewed and are negative.  EKGs/Labs/Other Studies Reviewed:    The following studies were reviewed today:  December 21, 2023 cardiac MRI EF 30% Global hypokinesis, inferior wall akinesis Transmural late gadolinium enhancement in the basal to mid inferior left ventricle 25% LV myocardial scar  September 16, 2023 ZIO monitor personally reviewed 21.4% burden of PVCs       Physical Exam:    VS:  BP (!) 140/72 (BP Location: Left Arm, Patient Position: Sitting, Cuff Size: Normal)   Pulse 83   Ht 6\' 2"  (1.88 m)   Wt 204 lb (92.5 kg)   SpO2 98%   BMI 26.19 kg/m     Wt Readings from Last 3 Encounters:  01/04/24 204 lb (92.5 kg)  12/21/23 203 lb 3.2 oz (92.2 kg)  10/19/23 203 lb (92.1 kg)     GEN: no distress CARD: RRR, No MRG RESP: No IWOB. CTAB.        ASSESSMENT AND  PLAN:    1. Chronic systolic heart failure (HCC)   2. Ischemic cardiomyopathy   3. Coronary artery disease involving native coronary artery of native heart without angina pectoris   4. Frequent PVCs     #Frequent PVCs I suspect he has frequent PVCs originating from the border zone around his prior infarct (left superior axis of the PVC).  For now, recommend continuing metoprolol succinate 75 mg by mouth daily.  Will add mexiletine 150 mg by mouth twice daily to see if this helps suppress the PVC.  #Chronic systolic heart failure #Ischemic cardiomyopathy #Coronary artery disease The patient has NYHA class II heart failure symptoms with a persistently reduced ejection fraction.  His cardiac MRI shows a EF of 30% with 25% of his myocardial mass being comprised of scar.  I discussed his risk for sudden cardiac death during today's clinic appointment.  I believe he is at significantly increased risk because of the scar and associated ectopy. I have recommended ICD for primary prevention.  Given the burden of scar on MRI, I would prefer this to be a transvenous device to have ATP capabilities.   The patient has an ischemic CM (EF 30%), NYHA Class III CHF, and CAD.  He is referred by Shane Listen, PA-C for risk stratification of sudden death and  consideration of ICD implantation.  At this time, he meets criteria for ICD implantation for primary prevention of sudden death.  I have had a thorough discussion with the patient reviewing options.  The patient and their family (if available) have had opportunities to ask questions and have them answered. The patient and I have decided together through a shared decision making process to proceed with ICD implant at this time.    Risks, benefits, alternatives to ICD implantation were discussed in detail with the patient today. The patient understands that the risks include but are not limited to bleeding, infection, pneumothorax, perforation, tamponade, vascular  damage, renal failure, MI, stroke, death, inappropriate shocks, and lead dislodgement and wishes to proceed.  We will therefore schedule device implantation at the next available time.  He will hold the ticagrelor for 5 days prior to the implant and continuing uninterrupted aspirin given how long it has been since his initial presentation (September 03, 2022).  I am going to have him increase his losartan to 50mg  by mouth daily. He has an appointment with Dr Gala Romney later this month.       Signed, Shane [name]. Shane Brothers, MD, [facility], [facility] 01/04/2024 10:52 AM    Electrophysiology [facility]

## 2024-01-04 ENCOUNTER — Other Ambulatory Visit: Payer: Self-pay

## 2024-01-04 ENCOUNTER — Encounter: Payer: Self-pay | Admitting: Cardiology

## 2024-01-04 ENCOUNTER — Ambulatory Visit: Payer: BC Managed Care – PPO | Attending: Cardiology | Admitting: Cardiology

## 2024-01-04 VITALS — BP 140/72 | HR 83 | Ht 74.0 in | Wt 204.0 lb

## 2024-01-04 DIAGNOSIS — I5022 Chronic systolic (congestive) heart failure: Secondary | ICD-10-CM

## 2024-01-04 DIAGNOSIS — I2510 Atherosclerotic heart disease of native coronary artery without angina pectoris: Secondary | ICD-10-CM | POA: Diagnosis not present

## 2024-01-04 DIAGNOSIS — I493 Ventricular premature depolarization: Secondary | ICD-10-CM | POA: Diagnosis not present

## 2024-01-04 DIAGNOSIS — I255 Ischemic cardiomyopathy: Secondary | ICD-10-CM

## 2024-01-04 MED ORDER — MEXILETINE HCL 150 MG PO CAPS
150.0000 mg | ORAL_CAPSULE | Freq: Two times a day (BID) | ORAL | 3 refills | Status: DC
  Filled 2024-01-04: qty 60, 30d supply, fill #0

## 2024-01-04 MED ORDER — LOSARTAN POTASSIUM 50 MG PO TABS
50.0000 mg | ORAL_TABLET | Freq: Every day | ORAL | 3 refills | Status: DC
  Filled 2024-01-04: qty 30, 30d supply, fill #0

## 2024-01-04 NOTE — Patient Instructions (Signed)
 Medication Instructions:  Your physician has recommended you make the following change in your medication:  1) INCREASE losartan to 50 mg daily  2) START taking mexiletine 150 mg twice daily   *If you need a refill on your cardiac medications before your next appointment, please call your pharmacy*  Lab Work: BMET and CBC  Testing/Procedures: ICD Implant Your physician has recommended that you have a defibrillator inserted. An implantable cardioverter defibrillator (ICD) is a small device that is placed in your chest or, in rare cases, your abdomen. This device uses electrical pulses or shocks to help control life-threatening, irregular heartbeats that could lead the heart to suddenly stop beating (sudden cardiac arrest). Leads are attached to the ICD that goes into your heart. This is done in the hospital and usually requires an overnight stay. Please see the instruction sheet given to you today for more information.  Follow-Up: At [facility], you and your health needs are our priority.  As part of our continuing mission to provide you with exceptional heart care, our providers are all part of one team.  This team includes your primary Cardiologist (physician) and Advanced Practice Providers or APPs (Physician Assistants and Nurse Practitioners) who all work together to provide you with the care you need, when you need it.  Your next appointment:   We will call you to schedule your post-procedure appointments

## 2024-01-04 NOTE — Addendum Note (Signed)
 Addended by: Frutoso Schatz on: 01/04/2024 12:09 PM   Modules accepted: Orders

## 2024-01-05 ENCOUNTER — Telehealth: Payer: Self-pay | Admitting: Cardiology

## 2024-01-05 NOTE — Telephone Encounter (Signed)
 Received FMLA paperwork, LVM to fill out paperwork & pay $29 cash. Pilar has forms at desk

## 2024-01-05 NOTE — Telephone Encounter (Signed)
 Error

## 2024-01-09 ENCOUNTER — Other Ambulatory Visit: Payer: Self-pay

## 2024-01-11 ENCOUNTER — Encounter: Payer: Self-pay | Admitting: Family

## 2024-01-11 ENCOUNTER — Ambulatory Visit: Admitting: Family

## 2024-01-11 VITALS — BP 136/74 | HR 87 | Temp 97.7°F | Ht 74.0 in | Wt 210.4 lb

## 2024-01-11 DIAGNOSIS — F32A Depression, unspecified: Secondary | ICD-10-CM | POA: Diagnosis not present

## 2024-01-11 DIAGNOSIS — F419 Anxiety disorder, unspecified: Secondary | ICD-10-CM

## 2024-01-11 DIAGNOSIS — I429 Cardiomyopathy, unspecified: Secondary | ICD-10-CM

## 2024-01-11 DIAGNOSIS — F909 Attention-deficit hyperactivity disorder, unspecified type: Secondary | ICD-10-CM

## 2024-01-11 NOTE — Progress Notes (Unsigned)
 Assessment & Plan:  There are no diagnoses linked to this encounter.   Return precautions given.   Risks, benefits, and alternatives of the medications and treatment plan prescribed today were discussed, and patient expressed understanding.   Education regarding symptom management and diagnosis given to patient on AVS either electronically or printed.  No follow-ups on file.  Rennie Plowman, FNP  Subjective:    Patient ID: Shane [name], male    DOB: June 30, 1971, 53 y.o.   MRN: [id number]  CC: Shane [name] is a 53 y.o. male who presents today for follow up.   HPI: BP at 128/78   Compliant with Wellbutrin 150 mg daily, Zoloft 125 mg daily.  BuSpar 5 mg 3 times daily   ICD implant scheduled 02/08/24  Cardiology consult  Dr Lalla Brothers 01/04/2024 chronic systolic heart failure, ischemic cardiopathy.  Suspected frequent PVCs originating from the borders and around his prior infarct.  Continued on metoprolol succinate 75 mg daily.  Added mexiletine 150 mg by mouth twice daily  Allergies: Patient has no known allergies. Current Outpatient Medications on File Prior to Visit  Medication Sig Dispense Refill  . aspirin 81 MG chewable tablet Chew 1 tablet (81 mg total) by mouth daily. 30 tablet 1  . buPROPion (WELLBUTRIN XL) 150 MG 24 hr tablet Take 1 tablet (150 mg total) by mouth every morning for 3 days, THEN 2 tablets (300 mg total) every morning. 90 tablet 1  . busPIRone (BUSPAR) 5 MG tablet Take 1 tablet (5 mg total) by mouth 3 (three) times daily. 90 tablet 2  . losartan (COZAAR) 50 MG tablet Take 1 tablet (50 mg total) by mouth daily. 90 tablet 3  . mexiletine (MEXITIL) 150 MG capsule Take 1 capsule (150 mg total) by mouth 2 (two) times daily. 180 capsule 3  . rosuvastatin (CRESTOR) 40 MG tablet Take 1 tablet (40 mg total) by mouth daily. 30 tablet 6  . sertraline (ZOLOFT) 100 MG tablet Take 1 tablet (100 mg total) by mouth daily. 90 tablet 3  . sertraline (ZOLOFT) 25 MG tablet  Take 1 tablet (25 mg total) by mouth daily. 30 tablet 3  . ticagrelor (BRILINTA) 60 MG TABS tablet Take 1 tablet (60 mg total) by mouth 2 (two) times daily. 60 tablet 11  . triamcinolone ointment (KENALOG) 0.5 % Apply 1 Application topically 2 (two) times daily. Use sparingly for < 1 week on hands with vaseline 15 g 2  . amphetamine-dextroamphetamine (ADDERALL) 20 MG tablet Take 1 tablet (20 mg total) by mouth 2 (two) times daily. (Patient not taking: Reported on 12/21/2023) 60 tablet 0  . mirtazapine (REMERON) 15 MG tablet Take 1 tablet (15 mg total) by mouth at bedtime. (Patient not taking: Reported on 01/04/2024) 90 tablet 3  . [DISCONTINUED] dapagliflozin propanediol (FARXIGA) 10 MG TABS tablet Take 1 tablet (10 mg total) by mouth daily. (Patient not taking: Reported on 01/04/2024) 30 tablet 2   No current facility-administered medications on file prior to visit.    Review of Systems    Objective:    BP 136/74   Pulse 87   Temp 97.7 F (36.5 C) (Oral)   Ht 6\' 2"  (1.88 m)   Wt 210 lb 6.4 oz (95.4 kg)   SpO2 97%   BMI 27.01 kg/m  BP Readings from Last 3 Encounters:  01/11/24 136/74  01/04/24 (!) 140/72  12/21/23 138/84   Wt Readings from Last 3 Encounters:  01/11/24 210 lb 6.4 oz (95.4 kg)  01/04/24 204 lb (92.5 kg)  12/21/23 203 lb 3.2 oz (92.2 kg)      01/11/2024   11:55 AM 12/21/2023   10:26 AM 10/19/2023   10:44 AM  Depression screen PHQ 2/9  Decreased Interest 0 0 0  Down, Depressed, Hopeless 0 0 0  PHQ - 2 Score 0 0 0  Altered sleeping 0 0   Tired, decreased energy 0 0   Change in appetite 0 0   Feeling bad or failure about yourself  0 0   Trouble concentrating 0 0   Moving slowly or fidgety/restless 0 0   Suicidal thoughts 0 0   PHQ-9 Score 0 0   Difficult doing work/chores Not difficult at all Not difficult at all     Physical Exam

## 2024-01-12 ENCOUNTER — Encounter: Payer: Self-pay | Admitting: Family

## 2024-01-12 LAB — CBC WITH DIFFERENTIAL/PLATELET
Basophils Absolute: 0 10*3/uL (ref 0.0–0.2)
Basos: 1 %
EOS (ABSOLUTE): 0.1 10*3/uL (ref 0.0–0.4)
Eos: 2 %
Hematocrit: 43.7 % (ref 37.5–51.0)
Hemoglobin: 15.2 g/dL (ref 13.0–17.7)
Immature Grans (Abs): 0 10*3/uL (ref 0.0–0.1)
Immature Granulocytes: 0 %
Lymphocytes Absolute: 1.2 10*3/uL (ref 0.7–3.1)
Lymphs: 20 %
MCH: 33.6 pg — ABNORMAL HIGH (ref 26.6–33.0)
MCHC: 34.8 g/dL (ref 31.5–35.7)
MCV: 97 fL (ref 79–97)
Monocytes Absolute: 0.5 10*3/uL (ref 0.1–0.9)
Monocytes: 8 %
Neutrophils Absolute: 4.2 10*3/uL (ref 1.4–7.0)
Neutrophils: 69 %
Platelets: 277 10*3/uL (ref 150–450)
RBC: 4.52 x10E6/uL (ref 4.14–5.80)
RDW: 12.1 % (ref 11.6–15.4)
WBC: 6.1 10*3/uL (ref 3.4–10.8)

## 2024-01-12 LAB — BASIC METABOLIC PANEL WITH GFR
BUN/Creatinine Ratio: 13 (ref 9–20)
BUN: 10 mg/dL (ref 6–24)
CO2: 23 mmol/L (ref 20–29)
Calcium: 9.6 mg/dL (ref 8.7–10.2)
Chloride: 104 mmol/L (ref 96–106)
Creatinine, Ser: 0.79 mg/dL (ref 0.76–1.27)
Glucose: 87 mg/dL (ref 70–99)
Potassium: 4.7 mmol/L (ref 3.5–5.2)
Sodium: 142 mmol/L (ref 134–144)
eGFR: 108 mL/min/{1.73_m2} (ref >59)

## 2024-01-13 NOTE — Assessment & Plan Note (Signed)
 Chronic, stable. He is no longer on adderall. We discussed that we will not resume stimulant medication  d/t cardiomyopathy and recommendation of EP.

## 2024-01-13 NOTE — Assessment & Plan Note (Signed)
 Chronic, stable and improved. Continue Wellbutrin 150 mg daily, Zoloft 125 mg daily.  BuSpar 5 mg 3 times daily

## 2024-01-19 ENCOUNTER — Ambulatory Visit: Payer: BC Managed Care – PPO | Attending: Cardiology | Admitting: Cardiology

## 2024-01-19 ENCOUNTER — Encounter: Payer: Self-pay | Admitting: Cardiology

## 2024-01-19 VITALS — BP 136/88 | HR 82 | Ht 74.0 in | Wt 205.8 lb

## 2024-01-19 DIAGNOSIS — I2119 ST elevation (STEMI) myocardial infarction involving other coronary artery of inferior wall: Secondary | ICD-10-CM | POA: Diagnosis not present

## 2024-01-19 DIAGNOSIS — I255 Ischemic cardiomyopathy: Secondary | ICD-10-CM

## 2024-01-19 DIAGNOSIS — I5042 Chronic combined systolic (congestive) and diastolic (congestive) heart failure: Secondary | ICD-10-CM | POA: Diagnosis not present

## 2024-01-19 DIAGNOSIS — I493 Ventricular premature depolarization: Secondary | ICD-10-CM

## 2024-01-19 DIAGNOSIS — I2510 Atherosclerotic heart disease of native coronary artery without angina pectoris: Secondary | ICD-10-CM | POA: Diagnosis not present

## 2024-01-19 DIAGNOSIS — E785 Hyperlipidemia, unspecified: Secondary | ICD-10-CM

## 2024-01-19 DIAGNOSIS — Z955 Presence of coronary angioplasty implant and graft: Secondary | ICD-10-CM

## 2024-01-19 NOTE — Patient Instructions (Addendum)
 Medication Instructions:  Your physician has recommended you make the following change in your medication:   START Toprol XL 100 mg once daily  STOP Cozaar (Losartan)  START after 3 days Entresto 24-26 mg twice a day  OK to hold Brilinta for upcoming procedure.   *If you need a refill on your cardiac medications before your next appointment, please call your pharmacy*  Lab Work: None  If you have labs (blood work) drawn today and your tests are completely normal, you will receive your results only by: MyChart Message (if you have MyChart) OR A paper copy in the mail If you have any lab test that is abnormal or we need to change your treatment, we will call you to review the results.  Testing/Procedures: None  Follow-Up: At [facility], you and your health needs are our priority.  As part of our continuing mission to provide you with exceptional heart care, our providers are all part of one team.  This team includes your primary Cardiologist (physician) and Advanced Practice Providers or APPs (Physician Assistants and Nurse Practitioners) who all work together to provide you with the care you need, when you need it.  Your next appointment:   July or August  Provider:   Randene Bustard, MD

## 2024-01-19 NOTE — Progress Notes (Signed)
 Cardiology Office Note:  .   Date:  01/20/2024  ID:  Shane [name], DOB November 20, 1970, MRN [id number] PCP: Shane Catching, FNP  [facility] Providers Cardiologist:  Shane Bustard, MD Electrophysiologist:  Shane Byes, MD     Chief Complaint  Patient presents with   Follow-up    3 month follow up visit. Patient is doing well on today. Meds reviewed.    Coronary Artery Disease    No recurrent angina but suffering the consequences of a large infarct.  Recent cath showed patent stents.   Palpitations   Cardiomyopathy    His cardiomyopathy has interpreter scheduled for later with Dr. Marven [name].    Patient Profile: .     Shane [name] is a 53 y.o. male with a PMH notable for severe inferolateral infarct with resultant ischemic cardiomyopathy who presents here for 3 to 12-month follow-up at the request of Shane Catching, FNP.  Notable PMH Inferolateral STEMI (09/02/2022) => suspected RV involvement: Initial EF~45%; also suspected prolonged occlusive time with delayed presentation CAD-PCI 100% heavily thrombotic occlusion of the RCA (extensive thrombectomy and DES PCI) => 2 overlapping 38 and 30 mm DES (proximal to distal RCA) restoring TIMI III flow => distal embolization down RPL and PDA ICM initial EF was 35 to 40%-most recent EF on echo 01/2023 was 40 to 45% => however worse on cardiac MRI (30%) -> NYHA class I-II symptoms at most.  Freq PVCs with NSVT -> plan for ICD, currently on mexiletine 150 mg twice daily (beta-blocker not listed)   I have not seen Shane [name] since May of 2024: At that time I was working to titrate his Toprol  to 100 mg daily and added losartan  25 mg daily.  He had significant PVCs noted on his EKG as well as rhythm strip in the clinic.  We ordered a 7-day Zio patch monitor to assess PVC burden.  Plan was for long-term thienopyridine SAPT coverage after 1 year DAPT. => He was seen roughly 1 month later by Shane Gentleman, PA because of some positional  dizziness after titration of Toprol  and addition of losartan .  Also the presence of roughly 21% PVC burden with ventricular bigeminy and trigeminy as well as couplets triplets and NSVT noted on Zio patch. => Plan was to titrate Toprol  up to 150 mg daily (100 mg in the morning and 50 mg p.m.) along with losartan  25 mg daily => plan was to recheck Zio patch after titration. => May 04, 2023 seen again by Shane [name] noted the palpitations did improve with the higher dose of Toprol  with no notable angina.->  Plan is to reevaluate with an echocardiogram following improvement of PVCs and discussed possibility of ischemic evaluation.  3-day Zio patch recheck. => He was again seen by Shane [name] on 09/08/2023-no improvement of PVC burden on monitor.  No cardiac symptoms just occasional orthostatic dizziness with standing up quickly but no PND, orthopnea or edema.  No angina.  Had reduced his Brilinta  to once daily and occasionally forgetting his second dose of metoprolol . => Referred for cardiac catheterization which revealed widely patent stent.     Shane [name] was last seen on Oct 18, 2023 - post cath f/u with Shane [name], [place] -this was post cath follow-up.  Still relatively asymptomatic.  Plan was to refer to EP for evaluation for possible PVC ablation versus ICD.  Cardiac MRI ordered.  He has been seen by Dr. Marven [name] earlier this month on 01-04-24 noting feeling fatigue  and sleepy.  Of note that he had stopped his Adderall  recently.  Also notes orthostatic dizziness but no angina or heart failure symptoms.  It was his impression that the PVCs were probably originating from the border zone around his prior infarct based on the full morphology.  Plan was to continue Toprol  75 mg daily and added mexiletine 150 mg twice daily to help suppress PVCs, but with concern for large scar on MRI and EF that was probably more 25 to 35% he felt that ICD was warranted (especially in light of frequent PVCs with NSVT.  He also  suggested follow-up with Shane [name] none  Subjective  Discussed the use of AI scribe software for clinical note transcription with the patient, who gave verbal consent to proceed.  History of Present Illness History of Present Illness Shane [name] "Shane [name]" is a 53 year old male with myocardial infarction and heart failure who presents for follow-up regarding premature ventricular contractions and medication management.  He experienced a myocardial infarction on September 02, 2022, and subsequently developed heart failure, requiring hospitalization in December 2024. Since then, he has been undergoing medication adjustments.  In December 2024, a heart catheterization was performed to check the stents, which showed no new blockages. He was experiencing numerous premature beats, leading to medication adjustments, including an increase in Toprol  dosage. However, the PVCs did not decrease as much as desired.  An echocardiogram in October 2024 showed an ejection fraction (EF) of 40-45%, but another evaluation suggested it was closer to 35%, with abnormal relaxation. A cardiac MRI confirmed an EF of 30-35% and approximately 25% of the heart muscle wall was scarred from the heart attack.  He was initially on metoprolol , which was later discontinued in December 2024, along with Farxiga . His current medications include losartan , which was increased from 25 mg to 50 mg, and mexiletine. He experiences occasional heart racing or skipping when resting or lying down.  Today he is feeling well.  He denies chest pain or pressure with rest or exertion.Shane [name]  No PND, orthopnea edema.  He does note that he  experiences fatigue and shortness of breath with physical activity, especially in extreme temperatures. He also gets tired quickly when mowing the lawn.  He notes exercise intolerance and fatigue, symptoms with this.  No progressive exertional dyspnea or any chest pain.  No sensation of irregular heartbeats or  palpitations.  No syncope or near syncope.  No TIA or emesis fingers.  No claudication.  He is not currently experiencing swelling in his legs.  He had been on Farxiga  but says that it was stopped.  He also somehow mistakenly has not been taking his metoprolol .   Objective    Studies Reviewed: .         Echo 2023/07/07: EF estimated 40 to 45% however 3D volume was 35%.  Global HK.  GR 1 DD.  Normal RV size and function.  Normal valves.  Normal RAP and PAP.  Frequent PVCs 7-day ZIO October 2024 showed heart rate range 54 to 140 bpm with an average of 87 bpm.  Still 21% PVCs with 2.2% couplets and notable episodes of bigeminy and trigeminy. Cardiac Cath 09/15/2023: Widely patent proximal to mid RCA stents with near normal left coronary arteries.  CMRI 12/21/2023: 1. Moderate-severely reduced LV systolic function.  LVEF 30%. 2.  global hypokinesis with inferior wall akinesis. 3. Transmural LV LGE/scar in the basal-mid inferior wall (not viable). 4. LV LGE/scar comprises 31g (approximately 25%) of total LV myocardial mass.  5. Findings suggest ischemic cardiomyopathy with prior inferior wall infarct.  Old Studies: Echo 01/2023 : EF 40 to 45%, global hypokinesis, mildly dilated internal LV cavity size, grade 1 diastolic dysfunction, normal RV systolic function and ventricular cavity size, and mild mitral regurgitation  Cardiac Cath-PCI 09/02/2022: 100% heavily thrombosed p-d RCA: Aspiration Thrombectomy ([place]) -large thrombus removal; overlapping.  DES 3.5 MM X 38 MM AND 3.5 MM 30 MM POST 3.65 MM.    Lab Results  Component Value Date   CHOL 186 12/21/2023   HDL 52 12/21/2023   LDLCALC 95 12/21/2023   TRIG 231 (H) 12/21/2023   CHOLHDL 3.6 12/21/2023   Lab Results  Component Value Date   NA 142 01/11/2024   K 4.7 01/11/2024   CREATININE 0.79 01/11/2024   EGFR 108 01/11/2024   GLUCOSE 87 01/11/2024      Latest Ref Rng & Units 01/11/2024   12:53 PM 12/21/2023   10:52 AM 09/08/2023    12:24 PM  CBC  WBC 3.4 - 10.8 x10E3/uL 6.1  5.6  7.6   Hemoglobin 13.0 - 17.7 g/dL 69.6  29.5  28.4   Hematocrit 37.5 - 51.0 % 43.7  46.4  52.3   Platelets 150 - 450 x10E3/uL 277  260.0  291     Risk Assessment/Calculations:     Physical Exam:   VS:  BP 136/88   Pulse 82   Ht 6\' 2"  (1.88 m)   Wt 205 lb 12.8 oz (93.4 kg)   SpO2 99%   BMI 26.42 kg/m    Wt Readings from Last 3 Encounters:  01/19/24 205 lb 12.8 oz (93.4 kg)  01/11/24 210 lb 6.4 oz (95.4 kg)  01/04/24 204 lb (92.5 kg)    GEN: Overall healthy-appearing.  Well nourished, well developed in no acute distress; sleeping on the couch. NECK: No JVD; No carotid bruits CARDIAC: Normal S1, S2; RRR, no murmurs, rubs, gallops RESPIRATORY:  Clear to auscultation without rales, wheezing or rhonchi ; nonlabored, good air movement. ABDOMEN: Soft, non-tender, non-distended EXTREMITIES:  No edema; No deformity     ASSESSMENT AND PLAN: .    Problem List Items Addressed This Visit       Cardiology Problems   Chronic combined systolic and diastolic heart failure (HCC) (Chronic)   Heart Failure with Reduced Ejection Fraction (HFrEF) Myocardial infarction in November 2023 caused significant cardiac damage. EF decreased to 30-35% with 25% myocardial scarring affecting the right ventricle. Fatigue and reduced exercise tolerance exacerbated by PVCs. Entresto preferred over losartan  for superior efficacy in heart failure management. - Switch losartan  to Entresto (sacubitril/valsartan)-will start at 24-26 mg twice daily. - Reintroduce metoprolol  succinate at 100 mg daily. -As blood pressure tolerates, would hope to either titrate Entresto versus add spironolactone. -Will look into why he is no longer on Farxiga  and would likely restart if there is no other contraindication - Monitor for improvement in symptoms and ejection fraction.  Reduced EF and significant myocardial scarring increase risk of life-threatening arrhythmias. ICD  recommended to prevent sudden cardiac death. - Proceed with ICD placement on Feb 08, 2024. - Advise to avoid excessive arm movement post-procedure. - Plan for potential overnight observation post-ICD placement.      Coronary artery disease involving native coronary artery without angina pectoris (Chronic)   Sizable inferior infarct with extensive PCI of the RCA but thankfully no further anginal symptoms. Most recent ischemic evaluation was a cardiac catheterization in December showing widely patent RCA stent and recanalization of the distal  PDA and PL system. -Continue maintenance dose of Brilinta  60 mg twice daily (SAPT) based on well over 65 mm stent in the RCA -Reintroduce Toprol  up to 100 mg daily and convert from losartan  to Entresto 24-26 mg twice daily -As BP tolerates consider either titration of Entresto versus addition of spironolactone at follow-up visit - On high-dose rosuvastatin , tolerating well.       Frequent PVCs; with NSVT (Chronic)   Frequent PVCs due to myocardial scarring from previous myocardial infarction, contributing to reduced cardiac efficiency and lower EF. PVCs may lead to life-threatening arrhythmias. Metoprolol  reintroduced for rhythm control. - Reintroduce metoprolol  at 100 mg daily. - Continue mexiletine. - Monitor PVC burden and adjust medications as needed.      Hyperlipidemia with target low density lipoprotein (LDL) cholesterol less than 55 mg/dL (Chronic)   Triglycerides are pretty high and LDL is still at 95 on Crestor  40 mg daily.  I suspect that he is in probably need more aggressive management financial concerns made it difficult, but I would like to potentially consider Repatha or Praluent or even potentially Leqvio.      Ischemic cardiomyopathy (Chronic)   Echocardiographically he had an EF of 35 to 40% at the time of his MI that supposedly was improved to 45% by echo, however with significant PVC burden of 21% range, his EF has been noted to be  roughly 30% on cardiac MRI and he has upcoming ICD placement scheduled. Orthotic symptoms have made it difficult to titrate his medications also trying to treat the PVCs has made it difficult to titrate his afterload reduction. For some reason he is not currently on Farxiga  which we can readdress in follow-up, but he is also not on his Toprol  which we will reinitiate at a lower dose.  Hopefully, with PVC suppression and potentially ablation (currently on mexiletine with hopes to suppress), his EF will improve with less frequent PVCs.  We are comforted the fact that he is not having any active CHF symptoms besides some fatigue and exertional dyspnea.      ST elevation myocardial infarction (STEMI) of inferolateral wall (HCC) - Primary (Chronic)   Unfortunately, he suffered a large inferior STEMI with a late presentation.  Significant thrombus in the RCA with a large territory of infarction suggested on Echo and confirmed by CMR.         Other   Presence of drug coated stent in right coronary artery (Chronic)   Somewhere between 60 to 66 mm stent in the RCA with plans for long-term thienopyridine SA PT.  Currently on 60 mg twice daily Brilinta .  Would like to continue Brilinta  at least through the completion of 2 years post MI but could consider switching to Plavix at a later date.  He is far enough out from his PCI that it would be okay to hold Brilinta  5 to 7 days preop for surgeries or procedures.        Medication Management Medication regimen adjusted to optimize heart failure treatment and control PVCs. Entresto preferred for additional benefits in fluid management and potential to improve exercise tolerance. - Switch losartan  to Entresto. - Reintroduce metoprolol . - Consider reintroducing Farxiga  after ICD placement. - Monitor for side effects and efficacy of medication changes.  Follow-up Follow-up necessary to assess effectiveness of new medication regimen and ICD placement. -  Schedule follow-up appointment in July or August 2025 to assess ICD function and medication efficacy. - Monitor PVC burden and fluid status through ICD data. -  Adjust treatment plan based on follow-up findings.  Follow-Up: Return in about 3 months (around 04/19/2024) for 3-4 month follow-up, Routine follow up with me, [place] office.  Recording duration: 25 minutes 25 spent with patient + 49 min spent charting = 74 min I spent 74 minutes in the care of Shane [name] today including reviewing labs (2 minutes), reviewing studies (multiple studies including cardiac cath report and films reviewed from December 2020 for, echocardiogram from October 24, cardiac MRI as well as reviewed look event monitor-12 minutes with cath films and echo windows reviewed), face to face time discussing treatment options (25 minutes), reviewing records from reviewing frequent notes by APP as well as EP (20 minutes), 15 minutes dictating, and documenting in the encounter.    Signed, Arleen Lacer, MD, MS Shane [name], M.D., M.S. Interventional Cardiologist  [facility]  Pager # [phone] Phone # [phone] [address] [place], [place] [address]

## 2024-01-20 ENCOUNTER — Telehealth: Payer: Self-pay | Admitting: Internal Medicine

## 2024-01-20 ENCOUNTER — Encounter: Payer: Self-pay | Admitting: Cardiology

## 2024-01-20 NOTE — Assessment & Plan Note (Addendum)
 Sizable inferior infarct with extensive PCI of the RCA but thankfully no further anginal symptoms. Most recent ischemic evaluation was a cardiac catheterization in December showing widely patent RCA stent and recanalization of the distal PDA and PL system. -Continue maintenance dose of Brilinta  60 mg twice daily (SAPT) based on well over 65 mm stent in the RCA -Reintroduce Toprol  up to 100 mg daily and convert from losartan  to Entresto 24-26 mg twice daily -As BP tolerates consider either titration of Entresto versus addition of spironolactone at follow-up visit - On high-dose rosuvastatin , tolerating well.

## 2024-01-20 NOTE — Assessment & Plan Note (Addendum)
 Heart Failure with Reduced Ejection Fraction (HFrEF) Myocardial infarction in November 2023 caused significant cardiac damage. EF decreased to 30-35% with 25% myocardial scarring affecting the right ventricle. Fatigue and reduced exercise tolerance exacerbated by PVCs. Entresto  preferred over losartan  for superior efficacy in heart failure management. - Switch losartan  to Entresto  (sacubitril /valsartan )-will start at 24-26 mg twice daily. - Reintroduce metoprolol  succinate at 100 mg daily. -As blood pressure tolerates, would hope to either titrate Entresto  versus add spironolactone . -Will look into why he is no longer on Farxiga  and would likely restart if there is no other contraindication - Monitor for improvement in symptoms and ejection fraction.  Reduced EF and significant myocardial scarring increase risk of life-threatening arrhythmias. ICD recommended to prevent sudden cardiac death. - Proceed with ICD placement on Feb 08, 2024. - Advise to avoid excessive arm movement post-procedure. - Plan for potential overnight observation post-ICD placement.

## 2024-01-20 NOTE — Telephone Encounter (Incomplete)
 Called to confirm/remind patient of their appointment [facility] on 01/23/24***.   Appointment:   [] Confirmed  [x] Left mess   [] No answer/No voice mail  [] VM Full/unable to leave message  [] Phone not in service  Patient reminded to bring all medications and/or complete list.  Confirmed patient has transportation. Gave directions, instructed to utilize valet parking.

## 2024-01-20 NOTE — Assessment & Plan Note (Signed)
 Echocardiographically he had an EF of 35 to 40% at the time of his MI that supposedly was improved to 45% by echo, however with significant PVC burden of 21% range, his EF has been noted to be roughly 30% on cardiac MRI and he has upcoming ICD placement scheduled. Orthotic symptoms have made it difficult to titrate his medications also trying to treat the PVCs has made it difficult to titrate his afterload reduction. For some reason he is not currently on Farxiga  which we can readdress in follow-up, but he is also not on his Toprol  which we will reinitiate at a lower dose.  Hopefully, with PVC suppression and potentially ablation (currently on mexiletine with hopes to suppress), his EF will improve with less frequent PVCs.  We are comforted the fact that he is not having any active CHF symptoms besides some fatigue and exertional dyspnea.

## 2024-01-20 NOTE — Assessment & Plan Note (Signed)
 Unfortunately, he suffered a large inferior STEMI with a late presentation.  Significant thrombus in the RCA with a large territory of infarction suggested on Echo and confirmed by CMR.

## 2024-01-20 NOTE — Assessment & Plan Note (Signed)
 Somewhere between 60 to 66 mm stent in the RCA with plans for long-term thienopyridine SA PT.  Currently on 60 mg twice daily Brilinta .  Would like to continue Brilinta  at least through the completion of 2 years post MI but could consider switching to Plavix at a later date.  He is far enough out from his PCI that it would be okay to hold Brilinta  5 to 7 days preop for surgeries or procedures.

## 2024-01-20 NOTE — Assessment & Plan Note (Signed)
 Triglycerides are pretty high and LDL is still at 95 on Crestor  40 mg daily.  I suspect that he is in probably need more aggressive management financial concerns made it difficult, but I would like to potentially consider Repatha or Praluent or even potentially Leqvio.

## 2024-01-20 NOTE — Assessment & Plan Note (Signed)
 Frequent PVCs due to myocardial scarring from previous myocardial infarction, contributing to reduced cardiac efficiency and lower EF. PVCs may lead to life-threatening arrhythmias. Metoprolol  reintroduced for rhythm control. - Reintroduce metoprolol  at 100 mg daily. - Continue mexiletine. - Monitor PVC burden and adjust medications as needed.

## 2024-01-23 ENCOUNTER — Other Ambulatory Visit: Payer: Self-pay

## 2024-01-23 ENCOUNTER — Encounter: Payer: Self-pay | Admitting: Internal Medicine

## 2024-01-23 ENCOUNTER — Ambulatory Visit: Attending: Internal Medicine | Admitting: Internal Medicine

## 2024-01-23 VITALS — BP 137/91 | HR 76 | Wt 209.4 lb

## 2024-01-23 DIAGNOSIS — I255 Ischemic cardiomyopathy: Secondary | ICD-10-CM | POA: Insufficient documentation

## 2024-01-23 DIAGNOSIS — I5089 Other heart failure: Secondary | ICD-10-CM | POA: Diagnosis not present

## 2024-01-23 DIAGNOSIS — I5022 Chronic systolic (congestive) heart failure: Secondary | ICD-10-CM | POA: Diagnosis not present

## 2024-01-23 DIAGNOSIS — Z955 Presence of coronary angioplasty implant and graft: Secondary | ICD-10-CM | POA: Diagnosis not present

## 2024-01-23 DIAGNOSIS — F17210 Nicotine dependence, cigarettes, uncomplicated: Secondary | ICD-10-CM | POA: Insufficient documentation

## 2024-01-23 DIAGNOSIS — I5042 Chronic combined systolic (congestive) and diastolic (congestive) heart failure: Secondary | ICD-10-CM | POA: Diagnosis not present

## 2024-01-23 DIAGNOSIS — I2510 Atherosclerotic heart disease of native coronary artery without angina pectoris: Secondary | ICD-10-CM

## 2024-01-23 DIAGNOSIS — I493 Ventricular premature depolarization: Secondary | ICD-10-CM | POA: Diagnosis not present

## 2024-01-23 DIAGNOSIS — Z7902 Long term (current) use of antithrombotics/antiplatelets: Secondary | ICD-10-CM | POA: Insufficient documentation

## 2024-01-23 DIAGNOSIS — R0683 Snoring: Secondary | ICD-10-CM | POA: Diagnosis not present

## 2024-01-23 DIAGNOSIS — I252 Old myocardial infarction: Secondary | ICD-10-CM | POA: Insufficient documentation

## 2024-01-23 MED ORDER — DAPAGLIFLOZIN PROPANEDIOL 10 MG PO TABS
10.0000 mg | ORAL_TABLET | Freq: Every day | ORAL | 2 refills | Status: DC
  Filled 2024-01-23: qty 30, 30d supply, fill #0
  Filled 2024-03-28: qty 30, 30d supply, fill #1
  Filled 2024-04-30: qty 30, 30d supply, fill #2

## 2024-01-23 MED ORDER — MEXILETINE HCL 200 MG PO CAPS
200.0000 mg | ORAL_CAPSULE | Freq: Two times a day (BID) | ORAL | 3 refills | Status: AC
  Filled 2024-01-23: qty 60, 30d supply, fill #0
  Filled 2024-04-30: qty 60, 30d supply, fill #1
  Filled 2024-06-29: qty 60, 30d supply, fill #2

## 2024-01-23 NOTE — Progress Notes (Signed)
 ADVANCED HF CLINIC CONSULT NOTE  Referring Physician: Calista Catching, FNP Primary Care: Calista Catching, FNP Primary Cardiologist: Randene Bustard, MD  Chief Complaint: Chronic Systolic Heart Failure  HPI:  Mr. Dunavant has been referred to [facility] by Varney Gentleman, PA-C, for evaluation for chronic systolic heart failure.   53 y/o male w/ h/o chronic systolic heart failure, frequent PVCs, CAD s/p acute inferior STEMI 08/2022 w/ cath showing severe single vessel occlusive disease w/ 100% p-mid RCA, 95%, m-dRCA w/ heavy clot burden treated w/ aspiration thrombectomy and DES. Normal left coronary system. Echo EF 35-40%, RV mildly reduced.   Echo 4/23 EF 40-45%, RV nl   Zio 6/24 high PVC burden 21%, Toprol  XL increased   Limited Echo 10/24 EF 40-45%, RV nl   Repeat Zio 12/24 PVC burden 21%   Repeat LHC 12/24 showed widely patent RCA stents with no significant restenosis. Near normal left coronary arteries. LVEDP 14  cMRI 3/25 LVEF 30%, RVEF 41%, +global HK w/ inferior wall AK, + transmural scar in the basal-mid inferior wall (non viable). ECV and T2 values not documented.   Recently referred to EP and scheduled for ICD implant. Mexiletin also added for PVC suppression   Presents to [facility] today for HF consultation. Here w/ his mother. He reports stable NYHA Class II symptoms. Limited more by fatigue than dyspnea. Denies CP. Fairly asymptomatic w/ PVCs. Denies palpitations. No syncope/ near syncope. No LEE, orthopnea/PND or wt gain.    EKG today shows NSR w/ occasional PVCs 76 bpm.   He reports full med compliance. Tolerating well w/o side effects.   He stopped taking Adderall . Has cut down on smoking. Prior to MI was smoking 1ppd, now smoking < 1/2 ppd. Drinks, typically 2 cocktails a week. No other drug use.   Reports h/o daytime fatigue and snoring. Has been ordered to get sleep study but not completed yet.   Only known family history of heart disease is paternal uncle, died from  massive MI in his 25s.     Past Medical History:  Diagnosis Date   Acute ST elevation myocardial infarction (STEMI) of inferolateral wall (HCC) 09/02/2022   Extensive thrombus in the RCA-thrombectomy with overlapping 3.5 mm stents (38 and 30 mm)   ADD (attention deficit disorder)    Alcohol abuse    History of.   Anxiety    Chronic HFrEF (heart failure with reduced ejection fraction) (HCC)    a. 09/2022 Echo: EF 35-40%, glob HK, GrI DD, mildly reduced RV fxn, RVSP 31.73mmHg, no significant valvular disease.   Coronary artery disease involving native coronary artery of native heart 08/2022   a. 08/2022 Inflat STEMI/PCI: LM nl, LAD nl, D1/2 nl, RI nl, LCX nl, OM1/2 nl, RCA 100p/m (thrombectomy & 3.5x38 & 3.5x30 overlapping Onyx Frontier DESs), 42m/d (thombectomy and PTCA into RPAV branch), RPDA 100, RPL1 100, EF 45-50%.   Depression    Frequent PVCs 02/17/2023   Hyperlipidemia    Hypertension    Ischemic cardiomyopathy    a. 09/2022 Echo: EF 35-40%.   Ischemic cardiomyopathy 02/17/2023   09/2022 (inferior STEMI) echo: EF 35-40%, glob HK, GrI DD, mildly reduced RV fxn, RVSP 31.68mmHg, no significant valvular disease.  TTE follow-up 01/06/2023: EF remains 40 to 45% with mild to moderate dysfunction       Current Outpatient Medications  Medication Sig Dispense Refill   aspirin  81 MG chewable tablet Chew 1 tablet (81 mg total) by mouth daily. 30 tablet 1   buPROPion  (  WELLBUTRIN  XL) 150 MG 24 hr tablet Take 1 tablet (150 mg total) by mouth every morning for 3 days, THEN 2 tablets (300 mg total) every morning. 90 tablet 1   busPIRone  (BUSPAR ) 5 MG tablet Take 1 tablet (5 mg total) by mouth 3 (three) times daily. 90 tablet 2   cholecalciferol (VITAMIN D3) 25 MCG (1000 UNIT) tablet Take 1,000 Units by mouth daily.     Cyanocobalamin  (VITAMIN B 12 PO) Take by mouth.     folic acid  (FOLVITE ) 1 MG tablet Take 1 mg by mouth daily.     metoprolol  succinate (TOPROL -XL) 100 MG 24 hr tablet Take 100 mg  by mouth daily. Take with or immediately following a meal.     mexiletine (MEXITIL ) 150 MG capsule Take 1 capsule (150 mg total) by mouth 2 (two) times daily. 180 capsule 3   mirtazapine  (REMERON ) 15 MG tablet Take 1 tablet (15 mg total) by mouth at bedtime. 90 tablet 3   rosuvastatin  (CRESTOR ) 40 MG tablet Take 1 tablet (40 mg total) by mouth daily. 30 tablet 6   sertraline  (ZOLOFT ) 100 MG tablet Take 1 tablet (100 mg total) by mouth daily. 90 tablet 3   sertraline  (ZOLOFT ) 25 MG tablet Take 1 tablet (25 mg total) by mouth daily. 30 tablet 3   ticagrelor  (BRILINTA ) 60 MG TABS tablet Take 1 tablet (60 mg total) by mouth 2 (two) times daily. 60 tablet 11   triamcinolone  ointment (KENALOG ) 0.5 % Apply 1 Application topically 2 (two) times daily. Use sparingly for < 1 week on hands with vaseline 15 g 2   No current facility-administered medications for this visit.    No Known Allergies    Social History   Socioeconomic History   Marital status: Divorced    Spouse name: Akeem Heppler   Number of children: 2   Years of education: 14   Highest education level: Not on file  Occupational History    Comment: [employer]  Tobacco Use   Smoking status: Some Days    Current packs/day: 0.00    Average packs/day: 0.3 packs/day for 20.0 years (5.0 ttl pk-yrs)    Types: Cigarettes    Start date: 09/02/2002    Last attempt to quit: 09/02/2022    Years since quitting: 1.3   Smokeless tobacco: Never   Tobacco comments:    1/2 PPW; 02/17/2023  Vaping Use   Vaping status: Never Used  Substance and Sexual Activity   Alcohol use: No    Comment: no alcohol since 12/16/2012   Drug use: No   Sexual activity: Yes    Partners: Female  Other Topics Concern   Not on file  Social History Narrative   Divorced in 08/2020; they have  two daughters (22 and 105 )    In the past-Rebuilds induction coils for work   05/2022- started [employer]   Social Drivers of Health   Financial  Resource Strain: Not on file  Food Insecurity: Not on file  Transportation Needs: Not on file  Physical Activity: Not on file  Stress: Not on file  Social Connections: Not on file  Intimate Partner Violence: Not on file      Family History  Problem Relation Age of Onset   Prostate cancer Father    Hyperlipidemia Father    Hypertension Father    Lung cancer Maternal Grandfather    Heart attack Maternal Grandfather 76       died   Alcohol abuse  Paternal Grandfather    Hypertension Cousin    Hyperlipidemia Other    Heart attack Paternal Uncle 30       died   Heart attack Maternal Great-grandfather 77   Alcohol abuse Maternal Aunt     Vitals:   01/23/24 1347 01/23/24 1425  BP: (!) 152/94 (!) 137/91  Pulse: (!) 50   SpO2: 100%   Weight: 209 lb 6.4 oz (95 kg)     PHYSICAL EXAM: General:  Well appearing. No respiratory difficulty HEENT: normal Neck: supple. JVD not elevated. Carotids 2+ bilat; no bruits. No lymphadenopathy or thryomegaly appreciated. Cor: PMI nondisplaced. Irregular rhythm (frequent PVCs). No rubs, gallops or murmurs. Lungs: clear Abdomen: soft, nontender, nondistended. No hepatosplenomegaly. No bruits or masses. Good bowel sounds. Extremities: no cyanosis, clubbing, rash, edema Neuro: alert & oriented x 3, cranial nerves grossly intact. moves all 4 extremities w/o difficulty. Affect pleasant.  ECG: NSR w/ 2 PVCs, personally reviewed    ASSESSMENT & PLAN:  1. Chronic Systolic Heart Failure - Ischemic +/- mixed nonischemic CM w/ ? PVC mediated component. Uncertain if PVCs are contributing to CM vs 2/2 cardiomyopathy/scar   - Peri MI echo 12/23 EF 35-40%, RV mildly reduce - Limited echo 4/24 EF 40-45%, RV nl  - Echo 10/24 EF 40-45%, RV nl  - Repeat LHC 12/24 widely patent RCA stents, normal left coronary system - cMRI 3/25 LVEF 30%, RVEF 41%, + transmural LGE scar in the basal-mid inferior wall (non viable). ECV and T2 values not documented.  - Zios   have shown high PVC burden (21%)  - Has been referred for ICD given low EF, scar burden and high ventricular ectopy burden. Implant scheduled 5/7   - also needs sleep study to r/o OSA - NYHA Class II, Euvolemic on exam  - Continue Losartan  50 mg daily. Plan transition to Entresto next visit. Qualify's for $10 copay card, confirmed w/ pharmD - Restart Farxiga  10 mg daily  - Continue Toprol  Xl 100 mg daily  - Consider spiro next visit  2. Frequent PVCs - Zio 6/24 21% burden - Zio 11/24 21% burden >>LHC w/ no obstructive CAD - Now on Mexiletine 150 mg bid. C/w PVCs on EKG - Increase Mexiletine to 200 mg bid  - seen by EP, suspect scar mediated from prior inferior infarct - need sleep study to r/o OSA as well  - stay off Adderall    3. CAD - s/p inferior MI 11/23, cath 1V CAD. S/p p-mRCA and m-dRCA stents  - patent RCA stents on repeat cath 12/24 - continue ASA 81 mg  - continue Crestor  40 mg   4. Tobacco abuse - smoking 1ppw  - discussed cessation   Ernestene Headings [facility]   Patient seen and examined with the above-signed Advanced Practice Provider and/or Housestaff. I personally reviewed laboratory data, imaging studies and relevant notes. I independently examined the patient and formulated the important aspects of the plan. I have edited the note to reflect any of my changes or salient points. I have personally discussed the plan with the patient and/or family.  53 y/o male with CAD s/p previous inferior MI 12/23 with initial EF ~40-45%. Referred by Varney Gentleman PA-C for further evaluation   Overall doing fairly well. NYHA II. Compliant with meds. However recent cMRI with EF down to 30% with previous basal-mid inferior scar.   General:  Well appearing. No resp difficulty HEENT: normal Neck: supple. no JVD. Carotids 2+ bilat; no bruits. No lymphadenopathy  or thryomegaly appreciated. Cor: PMI nondisplaced. Regular rate & rhythm. No rubs, gallops or murmurs. Lungs:  clear Abdomen: soft, nontender, nondistended. No hepatosplenomegaly. No bruits or masses. Good bowel sounds. Extremities: no cyanosis, clubbing, rash, edema Neuro: alert & orientedx3, cranial nerves grossly intact. moves all 4 extremities w/o difficulty. Affect pleasant  Suspect EF down in setting of frequent PVCs. Still having some PVCs on mexilitene 150 bid. Will increase to 200 bid. Check sleep study. Repeat echo in 2-3 months. If EF still down will repeat zio to requantify PVC burden. Continue to titrate GDMT as above.   Discussed need for smoking cessation.    Mardell Shade MD

## 2024-01-23 NOTE — Patient Instructions (Signed)
 Medication Changes:  INCREASE Mexiletine 200mg  (1 tab) two times daily  START Farxiga  10mg  (1 tab) daily   Testing/Procedures: Your provider has recommended that you have a home sleep study (Itamar Test).  We have provided you with the equipment in our office today. Please go ahead and download the app. DO NOT OPEN OR TAMPER WITH THE BOX UNTIL WE ADVISE YOU TO DO SO. Once insurance has approved the test our office will call you with PIN number and approval to proceed with testing. Once you have completed the test you just dispose of the equipment, the information is automatically uploaded to us  via blue-tooth technology. If your test is positive for sleep apnea and you need a home CPAP machine you will be contacted by Dr Charl Concha office [facility]) to set this up.    Follow-Up in: Please follow up with the [facility] in 2 months with Dr. Milon Aloe.   At the [facility], you and your health needs are our priority. We have a designated team specialized in the treatment of Heart Failure. This Care Team includes your primary Heart Failure Specialized Cardiologist (physician), Advanced Practice Providers (APPs- Physician Assistants and Nurse Practitioners), and Pharmacist who all work together to provide you with the care you need, when you need it.   You may see any of the following providers on your designated Care Team at your next follow up:  Dr. Jules Oar Dr. Peder Bourdon Dr. Alwin Baars Dr. Judyth Nunnery Shawnee Dellen, FNP Bevely Brush, RPH-CPP  Please be sure to bring in all your medications bottles to every appointment.   Need to Contact Us :  If you have any questions or concerns before your next appointment please send us  a message through [place] or call our office at [phone].    TO LEAVE A MESSAGE FOR THE NURSE SELECT OPTION 2, PLEASE LEAVE A MESSAGE INCLUDING: YOUR NAME DATE OF BIRTH CALL BACK NUMBER REASON FOR CALL**this  is important as we prioritize the call backs  YOU WILL RECEIVE A CALL BACK THE SAME DAY AS LONG AS YOU CALL BEFORE 4:00 PM

## 2024-01-24 ENCOUNTER — Other Ambulatory Visit: Payer: Self-pay

## 2024-01-24 ENCOUNTER — Encounter: Payer: Self-pay | Admitting: Cardiology

## 2024-01-24 DIAGNOSIS — F325 Major depressive disorder, single episode, in full remission: Secondary | ICD-10-CM

## 2024-01-24 MED ORDER — METOPROLOL SUCCINATE ER 100 MG PO TB24
100.0000 mg | ORAL_TABLET | Freq: Every day | ORAL | 3 refills | Status: DC
  Filled 2024-01-24: qty 30, 30d supply, fill #0
  Filled 2024-03-28: qty 30, 30d supply, fill #1
  Filled 2024-04-30: qty 30, 30d supply, fill #2
  Filled 2024-06-22: qty 30, 30d supply, fill #3

## 2024-01-24 MED ORDER — SERTRALINE HCL 25 MG PO TABS
25.0000 mg | ORAL_TABLET | Freq: Every day | ORAL | 3 refills | Status: DC
  Filled 2024-01-24: qty 30, 30d supply, fill #0
  Filled 2024-03-28: qty 30, 30d supply, fill #1
  Filled 2024-04-30: qty 30, 30d supply, fill #2
  Filled 2024-06-15: qty 30, 30d supply, fill #3

## 2024-01-24 NOTE — Progress Notes (Signed)
 Patient Name:         DOB:       Height:     Weight:  Office Name:         Referring Provider:  Today's Date:  Date:   STOP BANG RISK ASSESSMENT S (snore) Have you been told that you snore?     YES   T (tired) Are you often tired, fatigued, or sleepy during the day?   YES  O (obstruction) Do you stop breathing, choke, or gasp during sleep? NO   P (pressure) Do you have or are you being treated for high blood pressure? YES   B (BMI) Is your body index greater than 35 kg/m? NO   A (age) Are you 51 years old or older? YES   N (neck) Do you have a neck circumference greater than 16 inches?   NO   G (gender) Are you a male? YES   TOTAL STOP/BANG "YES" ANSWERS 5                                                                       For Office Use Only              Procedure Order Form    YES to 3+ Stop Bang questions OR two clinical symptoms - patient qualifies for WatchPAT (CPT 95800)             Clinical Notes: Will consult Sleep Specialist and refer for management of therapy due to patient increased risk of Sleep Apnea. Ordering a sleep study due to the following two clinical symptoms: Excessive daytime sleepiness G47.10 History of high blood pressure R03.0     I understand that I am proceeding with a home sleep apnea test as ordered by my treating physician. I understand that untreated sleep apnea is a serious cardiovascular risk factor and it is my responsibility to perform the test and seek management for sleep apnea. I will be contacted with the results and be managed for sleep apnea by a local sleep physician. I will be receiving equipment and further instructions from [facility]. I shall promptly ship back the equipment via the included mailing label. I understand my insurance will be billed for the test and as the patient I am responsible for any insurance related out-of-pocket costs incurred. I have been provided with written instructions and can call for additional  video or telephonic instruction, with 24-hour availability of qualified personnel to answer any questions: Patient Help Desk [phone].  Patient Signature ______________________________________________________   Date______________________ Patient Telemedicine Verbal Consent

## 2024-01-24 NOTE — Progress Notes (Signed)
 ITAMAR home sleep study given to patient, all instructions explained, waiver signed, and CLOUDPAT registration complete.

## 2024-01-24 NOTE — Telephone Encounter (Signed)
 Called pharmacy and they have th rx for Zoloft  and metoprolol  ready for pickup. Lvm to inform pt as well

## 2024-01-25 ENCOUNTER — Ambulatory Visit: Payer: BC Managed Care – PPO | Admitting: Family

## 2024-01-26 NOTE — Telephone Encounter (Signed)
 Pt has been notified and medication picked up

## 2024-02-08 ENCOUNTER — Telehealth (HOSPITAL_COMMUNITY): Payer: Self-pay

## 2024-02-08 ENCOUNTER — Ambulatory Visit: Admission: RE | Admit: 2024-02-08 | Source: Home / Self Care | Admitting: Cardiology

## 2024-02-08 ENCOUNTER — Encounter: Admission: RE | Payer: Self-pay | Source: Home / Self Care

## 2024-02-08 DIAGNOSIS — I5022 Chronic systolic (congestive) heart failure: Secondary | ICD-10-CM

## 2024-02-08 SURGERY — ICD IMPLANT
Anesthesia: Moderate Sedation

## 2024-02-08 NOTE — Telephone Encounter (Signed)
 Left message for patient and advised him that he will need to arrive for his procedure tomorrow at 12:30pm. Advised him to let us  know that he has received this message about his new arrival time.

## 2024-02-08 NOTE — Telephone Encounter (Signed)
 Attempted to reach patient to discuss upcoming procedure, no answer. Left VM for patient to return call.

## 2024-02-08 NOTE — OR Nursing (Signed)
 Called patient due to late for 6:30 arrival. Dr Marven Slimmer notified of late arrival, unable to do procedure. Office will call to reschedule.

## 2024-02-09 ENCOUNTER — Encounter (HOSPITAL_COMMUNITY)
Admission: RE | Disposition: A | Discharge status: Home / Self Care | Payer: Self-pay | Source: Home / Self Care | Attending: Cardiology

## 2024-02-09 ENCOUNTER — Other Ambulatory Visit: Payer: Self-pay

## 2024-02-09 ENCOUNTER — Ambulatory Visit (HOSPITAL_COMMUNITY)
Admission: RE | Admit: 2024-02-09 | Discharge: 2024-02-10 | Disposition: A | Discharge status: Home / Self Care | Attending: Cardiology | Admitting: Cardiology

## 2024-02-09 DIAGNOSIS — Z7902 Long term (current) use of antithrombotics/antiplatelets: Secondary | ICD-10-CM | POA: Diagnosis not present

## 2024-02-09 DIAGNOSIS — I110 Hypertensive heart disease with heart failure: Secondary | ICD-10-CM | POA: Diagnosis not present

## 2024-02-09 DIAGNOSIS — Z95810 Presence of automatic (implantable) cardiac defibrillator: Secondary | ICD-10-CM | POA: Diagnosis present

## 2024-02-09 DIAGNOSIS — I5022 Chronic systolic (congestive) heart failure: Secondary | ICD-10-CM | POA: Diagnosis not present

## 2024-02-09 DIAGNOSIS — Z79899 Other long term (current) drug therapy: Secondary | ICD-10-CM | POA: Insufficient documentation

## 2024-02-09 DIAGNOSIS — I493 Ventricular premature depolarization: Secondary | ICD-10-CM | POA: Insufficient documentation

## 2024-02-09 DIAGNOSIS — F32A Depression, unspecified: Secondary | ICD-10-CM | POA: Diagnosis not present

## 2024-02-09 DIAGNOSIS — I2510 Atherosclerotic heart disease of native coronary artery without angina pectoris: Secondary | ICD-10-CM | POA: Insufficient documentation

## 2024-02-09 DIAGNOSIS — I255 Ischemic cardiomyopathy: Secondary | ICD-10-CM | POA: Diagnosis not present

## 2024-02-09 DIAGNOSIS — Z7982 Long term (current) use of aspirin: Secondary | ICD-10-CM | POA: Insufficient documentation

## 2024-02-09 HISTORY — PX: ICD IMPLANT: EP1208

## 2024-02-09 MED ORDER — ACETAMINOPHEN 325 MG PO TABS
325.0000 mg | ORAL_TABLET | ORAL | Status: DC | PRN
  Administered 2024-02-09 – 2024-02-10 (×3): 650 mg via ORAL
  Administered 2024-02-10: 325 mg via ORAL
  Filled 2024-02-09 (×4): qty 2

## 2024-02-09 MED ORDER — MIDAZOLAM HCL 2 MG/2ML IJ SOLN
INTRAMUSCULAR | Status: AC
  Filled 2024-02-09: qty 2

## 2024-02-09 MED ORDER — MEXILETINE HCL 200 MG PO CAPS
200.0000 mg | ORAL_CAPSULE | Freq: Two times a day (BID) | ORAL | Status: DC
  Administered 2024-02-09 – 2024-02-10 (×2): 200 mg via ORAL
  Filled 2024-02-09 (×2): qty 1

## 2024-02-09 MED ORDER — POVIDONE-IODINE 10 % EX SWAB
2.0000 | Freq: Once | CUTANEOUS | Status: AC
  Administered 2024-02-09: 2 via TOPICAL

## 2024-02-09 MED ORDER — GENTAMICIN SULFATE 40 MG/ML IJ SOLN: 80.0000 mg | INTRAMUSCULAR | Status: AC

## 2024-02-09 MED ORDER — DAPAGLIFLOZIN PROPANEDIOL 10 MG PO TABS
10.0000 mg | ORAL_TABLET | Freq: Every day | ORAL | Status: DC
  Administered 2024-02-10: 10 mg via ORAL
  Filled 2024-02-09: qty 1

## 2024-02-09 MED ORDER — CEFAZOLIN SODIUM-DEXTROSE 2-4 GM/100ML-% IV SOLN: 2.0000 g | INTRAVENOUS | Status: AC

## 2024-02-09 MED ORDER — HEPARIN (PORCINE) IN NACL 2000-0.9 UNIT/L-% IV SOLN
INTRAVENOUS | Status: DC | PRN
  Administered 2024-02-09: 1000 mL

## 2024-02-09 MED ORDER — LOSARTAN POTASSIUM 50 MG PO TABS
50.0000 mg | ORAL_TABLET | Freq: Every day | ORAL | Status: DC
  Administered 2024-02-10: 50 mg via ORAL
  Filled 2024-02-09: qty 1

## 2024-02-09 MED ORDER — CEFAZOLIN SODIUM-DEXTROSE 2-4 GM/100ML-% IV SOLN
INTRAVENOUS | Status: AC
  Administered 2024-02-09: 2 g via INTRAVENOUS
  Filled 2024-02-09: qty 100

## 2024-02-09 MED ORDER — FENTANYL CITRATE (PF) 100 MCG/2ML IJ SOLN
INTRAMUSCULAR | Status: AC
  Filled 2024-02-09: qty 2

## 2024-02-09 MED ORDER — ROSUVASTATIN CALCIUM 20 MG PO TABS
40.0000 mg | ORAL_TABLET | Freq: Every day | ORAL | Status: DC
  Administered 2024-02-10: 40 mg via ORAL
  Filled 2024-02-09: qty 2

## 2024-02-09 MED ORDER — GENTAMICIN SULFATE 40 MG/ML IJ SOLN
INTRAMUSCULAR | Status: AC
  Administered 2024-02-09: 80 mg
  Filled 2024-02-09: qty 2

## 2024-02-09 MED ORDER — FENTANYL CITRATE (PF) 100 MCG/2ML IJ SOLN
INTRAMUSCULAR | Status: DC | PRN
  Administered 2024-02-09 (×3): 25 ug via INTRAVENOUS

## 2024-02-09 MED ORDER — CHLORHEXIDINE GLUCONATE 4 % EX SOLN
4.0000 | Freq: Once | CUTANEOUS | Status: DC
  Filled 2024-02-09: qty 60

## 2024-02-09 MED ORDER — ONDANSETRON HCL 4 MG/2ML IJ SOLN: 4.0000 mg | Freq: Four times a day (QID) | INTRAMUSCULAR | Status: DC | PRN

## 2024-02-09 MED ORDER — METOPROLOL SUCCINATE ER 100 MG PO TB24
100.0000 mg | ORAL_TABLET | Freq: Every day | ORAL | Status: DC
  Administered 2024-02-10: 100 mg via ORAL
  Filled 2024-02-09: qty 1

## 2024-02-09 MED ORDER — ASPIRIN 81 MG PO CHEW
81.0000 mg | CHEWABLE_TABLET | Freq: Every day | ORAL | Status: DC
  Administered 2024-02-10: 81 mg via ORAL
  Filled 2024-02-09: qty 1

## 2024-02-09 MED ORDER — LIDOCAINE HCL (PF) 1 % IJ SOLN
INTRAMUSCULAR | Status: AC
  Filled 2024-02-09: qty 60

## 2024-02-09 MED ORDER — SERTRALINE HCL 100 MG PO TABS
100.0000 mg | ORAL_TABLET | Freq: Every day | ORAL | Status: DC
  Administered 2024-02-10: 100 mg via ORAL
  Filled 2024-02-09: qty 1

## 2024-02-09 MED ORDER — MIRTAZAPINE 15 MG PO TABS: 15.0000 mg | ORAL_TABLET | Freq: Every evening | ORAL | Status: DC | PRN

## 2024-02-09 MED ORDER — MIDAZOLAM HCL 5 MG/5ML IJ SOLN
INTRAMUSCULAR | Status: DC | PRN
  Administered 2024-02-09 (×3): 1 mg via INTRAVENOUS

## 2024-02-09 MED ORDER — SODIUM CHLORIDE 0.9 % IV SOLN: INTRAVENOUS | Status: DC

## 2024-02-09 MED ORDER — LIDOCAINE HCL (PF) 1 % IJ SOLN
INTRAMUSCULAR | Status: DC | PRN
Start: 2024-02-09 — End: 2024-02-09
  Administered 2024-02-09: 50 mL

## 2024-02-09 NOTE — H&P (Signed)
 Electrophysiology Office Note:     Date:  02/09/2024    ID:  Shane [name], DOB 02/21/1971, MRN [id number]   [facility] Cardiologist:  Shane Bustard, MD  [facility] Electrophysiologist:  Shane Byes, MD    Referring MD: Shane Chick, PA-C    Chief Complaint: PVCs   History of Present Illness:     Mr. Shane [name] is a 53 year old man who I am seeing today for an evaluation of frequent PVCs at the request of Shane Gentleman, PA-C.  The patient has a history of coronary artery disease with prior STEMI in 2023.  He has chronic systolic heart failure, frequent PVCs, hypertension, hyperlipidemia, ADD, anxiety, tobacco use.  His EF at the time of his stenting was 35 to 40%.  At the appointment with Shane [name] in January he was doing well without angina.   He is with his mother today in clinic. He reports fatigue and sleeping all the time. Some of it is related to depression. Recently stopped his Adderall . No syncope. Feels lightheaded when he stands quickly. No edema.   Presents for ICD implant. Procedure reviewed.      Objective Their past medical, social and family history was reviewed.     ROS:   Please see the history of present illness.    All other systems reviewed and are negative.   EKGs/Labs/Other Studies Reviewed:     The following studies were reviewed today:   December 21, 2023 cardiac MRI EF 30% Global hypokinesis, inferior wall akinesis Transmural late gadolinium enhancement in the basal to mid inferior left ventricle 25% LV myocardial scar   September 16, 2023 ZIO monitor personally reviewed 21.4% burden of PVCs          Physical Exam:     VS:  BP 136/91 (BP Location: Left Arm, Patient Position: Sitting, Cuff Size: Normal)   Pulse 70   Ht 6\' 2"  (1.88 m)   Wt 204 lb (92.5 kg)   SpO2 98%   BMI 26.19 kg/m         Wt Readings from Last 3 Encounters:  01/04/24 204 lb (92.5 kg)  12/21/23 203 lb 3.2 oz (92.2 kg)  10/19/23 203 lb (92.1 kg)      GEN: no  distress CARD: RRR, No MRG RESP: No IWOB. CTAB.         Assessment ASSESSMENT AND PLAN:     1. Chronic systolic heart failure (HCC)   2. Ischemic cardiomyopathy   3. Coronary artery disease involving native coronary artery of native heart without angina pectoris   4. Frequent PVCs       #Frequent PVCs I suspect he has frequent PVCs originating from the border zone around his prior infarct (left superior axis of the PVC).   For now, recommend continuing metoprolol  succinate 75 mg by mouth daily.  Will add mexiletine 150 mg by mouth twice daily to see if this helps suppress the PVC.   #Chronic systolic heart failure #Ischemic cardiomyopathy #Coronary artery disease The patient has NYHA class II heart failure symptoms with a persistently reduced ejection fraction.  His cardiac MRI shows a EF of 30% with 25% of his myocardial mass being comprised of scar.  I discussed his risk for sudden cardiac death during today's clinic appointment.  I believe he is at significantly increased risk because of the scar and associated ectopy. I have recommended ICD for primary prevention.  Given the burden of scar on MRI, I would prefer this to be  a transvenous device to have ATP capabilities.    The patient has an ischemic CM (EF 30%), NYHA Class III CHF, and CAD.  He is referred by Shane Gentleman, PA-C for risk stratification of sudden death and consideration of ICD implantation.  At this time, he meets criteria for ICD implantation for primary prevention of sudden death.  I have had a thorough discussion with the patient reviewing options.  The patient and their family (if available) have had opportunities to ask questions and have them answered. The patient and I have decided together through a shared decision making process to proceed with ICD implant at this time.     Risks, benefits, alternatives to ICD implantation were discussed in detail with the patient today. The patient understands that the risks  include but are not limited to bleeding, infection, pneumothorax, perforation, tamponade, vascular damage, renal failure, MI, stroke, death, inappropriate shocks, and lead dislodgement and wishes to proceed.  We will therefore schedule device implantation at the next available time.   He will hold the ticagrelor  for 5 days prior to the implant and continuing uninterrupted aspirin  given how long it has been since his initial presentation (September 03, 2022).   I am going to have him increase his losartan  to 50mg  by mouth daily. He has an appointment with Shane [name] later this month.        Presents for ICD implant. Procedure reviewed.      Signed, Shane [name]. Shane Slimmer, MD, [facility], [facility] 02/09/2024 Electrophysiology [facility]

## 2024-02-09 NOTE — Plan of Care (Signed)
  Problem: Cardiac: Goal: Ability to achieve and maintain adequate cardiopulmonary perfusion will improve Outcome: Progressing   Problem: Education: Goal: Knowledge of General Education information will improve Description: Including pain rating scale, medication(s)/side effects and non-pharmacologic comfort measures Outcome: Progressing   Problem: Clinical Measurements: Goal: Respiratory complications will improve Outcome: Progressing Goal: Cardiovascular complication will be avoided Outcome: Progressing   Problem: Activity: Goal: Risk for activity intolerance will decrease Outcome: Progressing

## 2024-02-10 ENCOUNTER — Encounter: Payer: Self-pay | Admitting: Emergency Medicine

## 2024-02-10 ENCOUNTER — Ambulatory Visit (HOSPITAL_COMMUNITY)

## 2024-02-10 ENCOUNTER — Encounter (HOSPITAL_COMMUNITY): Payer: Self-pay | Admitting: Cardiology

## 2024-02-10 DIAGNOSIS — Z7982 Long term (current) use of aspirin: Secondary | ICD-10-CM | POA: Diagnosis not present

## 2024-02-10 DIAGNOSIS — Z95810 Presence of automatic (implantable) cardiac defibrillator: Secondary | ICD-10-CM | POA: Diagnosis not present

## 2024-02-10 DIAGNOSIS — Z7902 Long term (current) use of antithrombotics/antiplatelets: Secondary | ICD-10-CM | POA: Diagnosis not present

## 2024-02-10 DIAGNOSIS — Z79899 Other long term (current) drug therapy: Secondary | ICD-10-CM | POA: Diagnosis not present

## 2024-02-10 DIAGNOSIS — I493 Ventricular premature depolarization: Secondary | ICD-10-CM | POA: Diagnosis not present

## 2024-02-10 DIAGNOSIS — I110 Hypertensive heart disease with heart failure: Secondary | ICD-10-CM | POA: Diagnosis not present

## 2024-02-10 DIAGNOSIS — I5022 Chronic systolic (congestive) heart failure: Secondary | ICD-10-CM | POA: Diagnosis not present

## 2024-02-10 DIAGNOSIS — F32A Depression, unspecified: Secondary | ICD-10-CM | POA: Diagnosis not present

## 2024-02-10 DIAGNOSIS — I2510 Atherosclerotic heart disease of native coronary artery without angina pectoris: Secondary | ICD-10-CM | POA: Diagnosis not present

## 2024-02-10 DIAGNOSIS — I255 Ischemic cardiomyopathy: Secondary | ICD-10-CM | POA: Diagnosis not present

## 2024-02-10 DIAGNOSIS — J9811 Atelectasis: Secondary | ICD-10-CM | POA: Diagnosis not present

## 2024-02-10 MED FILL — Midazolam HCl Inj 2 MG/2ML (Base Equivalent): INTRAMUSCULAR | Qty: 2 | Status: AC

## 2024-02-10 MED FILL — Midazolam HCl Inj 2 MG/2ML (Base Equivalent): INTRAMUSCULAR | Qty: 1 | Status: AC

## 2024-02-10 NOTE — Discharge Instructions (Signed)
 After Your ICD (Implantable Cardiac Defibrillator)   You have a Environmental manager ICD  ACTIVITY Do not lift your arm above shoulder height for 1 week after your procedure. After 7 days, you may progress as below.  You should remove your sling 24 hours after your procedure, unless otherwise instructed by your provider.     Friday Feb 17, 2024  Saturday Feb 18, 2024 Sunday Feb 19, 2024 Monday Feb 20, 2024   Do not lift, push, pull, or carry anything over 10 pounds with the affected arm until 6 weeks (Friday March 23, 2024 ) after your procedure.   You may drive AFTER your wound check, unless you have been told otherwise by your provider.   Ask your healthcare provider when you can go back to work   INCISION/Dressing Resume your Ticagrelor  / Brilinta  on the evening of 02/12/24  If large square, outer bandage is left in place, this can be removed after 24 hours from your procedure. Do not remove steri-strips or glue as below.   Monitor your defibrillator site for redness, swelling, and drainage. Call the device clinic at [phone] if you experience these symptoms or fever/chills.  If your incision is sealed with Steri-strips or staples, you may shower 7 days after your procedure or when told by your provider. Do not remove the steri-strips or let the shower hit directly on your site. You may wash around your site with soap and water.    If you were discharged in a sling, please do not wear this during the day more than 48 hours after your surgery unless otherwise instructed. This may increase the risk of stiffness and soreness in your shoulder.   Avoid lotions, ointments, or perfumes over your incision until it is well-healed.  You may use a hot tub or a pool AFTER your wound check appointment if the incision is completely closed.  Your ICD is designed to protect you from life threatening heart rhythms. Because of this, you may receive a shock.   1 shock with no symptoms:  Call the  office during business hours. 1 shock with symptoms (chest pain, chest pressure, dizziness, lightheadedness, shortness of breath, overall feeling unwell):  Call 911. If you experience 2 or more shocks in 24 hours:  Call 911. If you receive a shock, you should not drive for 6 months per the [place] DMV IF you receive appropriate therapy from your ICD.   ICD Alerts:  Some alerts are vibratory and others beep. These are NOT emergencies. Please call our office to let us  know. If this occurs at night or on weekends, it can wait until the next business day. Send a remote transmission.  If your device is capable of reading fluid status (for heart failure), you will be offered monthly monitoring to review this with you.   DEVICE MANAGEMENT Remote monitoring is used to monitor your ICD from home. This monitoring is scheduled every 91 days by our office. It allows us  to keep an eye on the functioning of your device to ensure it is working properly. You will routinely see your Electrophysiologist annually (more often if necessary).   You should receive your ID card for your new device in 4-8 weeks. Keep this card with you at all times once received. Consider wearing a medical alert bracelet or necklace.  Your ICD  may be MRI compatible. This will be discussed at your next office visit/wound check.  You should avoid contact with strong electric or magnetic fields.  Do not use amateur (ham) radio equipment or electric (arc) welding torches. MP3 player headphones with magnets should not be used. Some devices are safe to use if held at least 12 inches (30 cm) from your defibrillator. These include power tools, lawn mowers, and speakers. If you are unsure if something is safe to use, ask your health care provider.  When using your cell phone, hold it to the ear that is on the opposite side from the defibrillator. Do not leave your cell phone in a pocket over the defibrillator.  You may safely use electric blankets,  heating pads, computers, and microwave ovens.  Call the office right away if: You have chest pain. You feel more than one shock. You feel more short of breath than you have felt before. You feel more light-headed than you have felt before. Your incision starts to open up.  This information is not intended to replace advice given to you by your health care provider. Make sure you discuss any questions you have with your health care provider.

## 2024-02-10 NOTE — Discharge Summary (Signed)
 ELECTROPHYSIOLOGY PROCEDURE DISCHARGE SUMMARY    Patient ID: Shane [name],  MRN: [id number], DOB/AGE: 04/23/1971 53 y.o.  Admit date: 02/09/2024 Discharge date: 02/10/2024  Primary Care Physician: Calista Catching, FNP  Primary Cardiologist: Randene Bustard, MD  Electrophysiologist: Dr. Marven Slimmer    Primary Diagnosis:  Chronic Systolic CHF    Secondary Diagnosis: CAD s/p STEMI  Frequent PVC's  HTN HLD  ADD  Anxiety  Tobacco Abuse   No Known Allergies   Procedures This Admission:  1.  Implantation of a [employer] single chamber ICD on 02/09/24 by Dr. Marven Slimmer.  The patient received a Radiation protection practitioner DR [phone] with [employer] [phone] right ventricular lead. Pt did not require a LV or Left bundle Lead. [name] DFTs were deferred at time of implant There were no post procedure complications 2.  CXR on 02/10/24 demonstrated no pneumothorax status post device implantation.      Brief HPI: [name] is a 53 y.o. male was referred to electrophysiology in the outpatient setting for consideration of ICD implantation.  Past medical history includes above.  The patient has persistent LV dysfunction despite guideline directed therapy.  Risks, benefits, and alternatives to ICD implantation were reviewed with the patient who wished to proceed.   Hospital Course:  The patient was admitted and underwent implantation of a [employer] single chamber ICD with details as outlined above. They were monitored on telemetry overnight which demonstrated NSR .  Left chest was without hematoma or ecchymosis.  The device was interrogated and found to be functioning normally.  CXR was obtained and demonstrated no pneumothorax status post device implantation..  Wound care, arm mobility, and restrictions were reviewed with the patient.  The patient was examined and considered stable for discharge to home.   The patient's discharge medications include an ARB and beta  blocker as below (cozaar  and toprol ).  Anticoagulation resumption The patient will continue ASA 81mg  daily.  He will hold Ticagrelor  for 72 hours and OK to restart the evening of 02/12/24.  Physical Exam: Vitals:   02/09/24 2056 02/09/24 2300 02/10/24 0548 02/10/24 0835  BP: 115/74 122/83 (!) 121/90 127/88  Pulse: 68 64 88 65  Resp: 20 17 18 18   Temp: 98.3 F (36.8 C) 97.7 F (36.5 C) 98.1 F (36.7 C) 98.4 F (36.9 C)  TempSrc: Oral Oral Oral Oral  SpO2: 96% 97% 97% (!) 9%  Weight:      Height:        GEN- pleasant adult male lying in bed in NAD. A&O x 3.  HEENT: Normocephalic, atraumatic Lungs- CTAB, normal effort.  Heart- RRR. No M/G/R.  GI- Soft, NT, ND.  Extremities- No clubbing, cyanosis, or edema Skin- Warm and dry, no rash or lesion. ICD site with steri-strips clean /dry, no hematoma or significant edema  Discharge Medications:  Allergies as of 02/10/2024   No Known Allergies      Medication List     TAKE these medications    aspirin  81 MG chewable tablet Chew 1 tablet (81 mg total) by mouth daily.   Brilinta  60 MG Tabs tablet Generic drug: ticagrelor  Take 1 tablet (60 mg total) by mouth 2 (two) times daily.   buPROPion  150 MG 24 hr tablet Commonly known as: WELLBUTRIN  XL Take 1 tablet (150 mg total) by mouth every morning for 3 days, THEN 2 tablets (300 mg total) every morning. Start taking on: December 21, 2023 What changed: See the new instructions.  busPIRone  5 MG tablet Commonly known as: BUSPAR  Take 1 tablet (5 mg total) by mouth 3 (three) times daily.   cetirizine 10 MG tablet Commonly known as: ZYRTEC Take 10 mg by mouth daily.   cholecalciferol 25 MCG (1000 UNIT) tablet Commonly known as: VITAMIN D3 Take 1,000 Units by mouth daily.   cyanocobalamin  1000 MCG tablet Commonly known as: VITAMIN B12 Take 1,000 mcg by mouth daily.   Farxiga  10 MG Tabs tablet Generic drug: dapagliflozin  propanediol Take 1 tablet (10 mg total) by mouth  daily.   folic acid  1 MG tablet Commonly known as: FOLVITE  Take 1 mg by mouth daily.   losartan  50 MG tablet Commonly known as: COZAAR  Take 50 mg by mouth daily.   metoprolol  succinate 100 MG 24 hr tablet Commonly known as: TOPROL -XL Take 1 tablet (100 mg total) by mouth daily. Take 100 mg by mouth daily. Take with or immediately following a meal.   mexiletine 200 MG capsule Commonly known as: MEXITIL  Take 1 capsule (200 mg total) by mouth 2 (two) times daily. What changed: Another medication with the same name was removed. Continue taking this medication, and follow the directions you see here.   mirtazapine  15 MG tablet Commonly known as: REMERON  Take 1 tablet (15 mg total) by mouth at bedtime. What changed:  when to take this reasons to take this   rosuvastatin  40 MG tablet Commonly known as: CRESTOR  Take 1 tablet (40 mg total) by mouth daily.   sertraline  100 MG tablet Commonly known as: ZOLOFT  Take 1 tablet (100 mg total) by mouth daily.   sertraline  25 MG tablet Commonly known as: ZOLOFT  Take 1 tablet (25 mg total) by mouth daily.   triamcinolone  ointment 0.5 % Commonly known as: KENALOG  Apply 1 Application topically 2 (two) times daily. Use sparingly for < 1 week on hands with vaseline What changed:  when to take this reasons to take this        Disposition: Home with usual follow up as in AVS  Duration of Discharge Encounter:  APP time: 37 minutes  Signed, Creighton Doffing, NP-C, AGACNP-BC [facility] - Electrophysiology  02/10/2024, 9:57 AM

## 2024-02-12 ENCOUNTER — Telehealth: Payer: Self-pay | Admitting: Cardiology

## 2024-02-12 NOTE — Telephone Encounter (Signed)
 Device rep interrogated his device, showed normal device function.  Rep made patient aware
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
## 2024-02-14 ENCOUNTER — Telehealth: Payer: Self-pay | Admitting: Cardiology

## 2024-02-14 NOTE — Telephone Encounter (Signed)
 I received an FMLA for from patient's employer, [employer].  I spoke with patient who said that he will sign the release of information and pay the $29 form fee when he comes in on 02/22/24 for his wound check.  Form is in Dr. Candace Cerise box.

## 2024-02-15 NOTE — Telephone Encounter (Signed)
 Left message for patient to call back to discuss returning to work.

## 2024-02-16 NOTE — Telephone Encounter (Signed)
 Received release forms for FMLA-[employer] via fax for patient to sign. Scanned into chart, and also placed up front for patient to sign at his 5/21 appointment. Fax notes patient will also pay $29 fee for forms at his appointment on 5/21

## 2024-02-17 NOTE — Telephone Encounter (Signed)
Forms have been completed and signed.

## 2024-02-17 NOTE — Telephone Encounter (Signed)
 Completed form faxed to the [place] office.  Patient will go there to sign and pay.  He said that he will pick up the form and take it to his employer.

## 2024-02-20 DIAGNOSIS — Z0279 Encounter for issue of other medical certificate: Secondary | ICD-10-CM

## 2024-02-20 NOTE — Telephone Encounter (Signed)
 Patient came in and signed forms and paid $29 processing fee. Sent billing sheet to billing via fax.Completed forms were given to patient. Completed forms were scanned into chart.

## 2024-02-22 ENCOUNTER — Ambulatory Visit: Attending: Cardiology

## 2024-02-22 ENCOUNTER — Other Ambulatory Visit: Payer: Self-pay

## 2024-02-22 ENCOUNTER — Other Ambulatory Visit: Payer: Self-pay | Admitting: Cardiology

## 2024-02-22 DIAGNOSIS — I255 Ischemic cardiomyopathy: Secondary | ICD-10-CM

## 2024-02-22 DIAGNOSIS — I493 Ventricular premature depolarization: Secondary | ICD-10-CM

## 2024-02-22 DIAGNOSIS — I5022 Chronic systolic (congestive) heart failure: Secondary | ICD-10-CM

## 2024-02-22 DIAGNOSIS — I2510 Atherosclerotic heart disease of native coronary artery without angina pectoris: Secondary | ICD-10-CM

## 2024-02-22 NOTE — Progress Notes (Signed)
 Normal single chamber ICD wound check. Wound well healed. Presenting rhythm: VS 65. Routine testing performed. Thresholds, sensing, and impedances consistent with implant measurements with 3.5V safety margin/auto capture until 3 month visit. No treated arrhythmias. Reviewed arm restrictions to continue for 6 weeks total post op. Reviewed shock plan.  Pt enrolled in remote follow-up.

## 2024-02-22 NOTE — Patient Instructions (Signed)

## 2024-02-23 ENCOUNTER — Other Ambulatory Visit: Payer: Self-pay

## 2024-03-27 ENCOUNTER — Ambulatory Visit

## 2024-03-27 DIAGNOSIS — I255 Ischemic cardiomyopathy: Secondary | ICD-10-CM | POA: Diagnosis not present

## 2024-03-27 LAB — CUP PACEART REMOTE DEVICE CHECK
Battery Remaining Longevity: 180 mo
Battery Remaining Percentage: 100 %
Brady Statistic RV Percent Paced: 0 %
Date Time Interrogation Session: 20250624011100
HighPow Impedance: 74 Ohm
Implantable Lead Connection Status: 753985
Implantable Lead Implant Date: 20250508
Implantable Lead Location: 753860
Implantable Lead Model: 673
Implantable Lead Serial Number: 263619
Implantable Pulse Generator Implant Date: 20250508
Lead Channel Impedance Value: 786 Ohm
Lead Channel Pacing Threshold Amplitude: 0.7 V
Lead Channel Pacing Threshold Pulse Width: 0.4 ms
Lead Channel Setting Pacing Amplitude: 3.5 V
Lead Channel Setting Pacing Pulse Width: 0.4 ms
Lead Channel Setting Sensing Sensitivity: 0.5 mV
Pulse Gen Serial Number: 352785
Zone Setting Status: 755011

## 2024-03-28 ENCOUNTER — Ambulatory Visit: Payer: Self-pay | Admitting: Cardiology

## 2024-03-28 ENCOUNTER — Telehealth: Payer: Self-pay | Admitting: Internal Medicine

## 2024-03-28 NOTE — Telephone Encounter (Signed)
 Called to confirm/remind patient of their appointment at [facility] on 03/29/24.   Appointment:   [] Confirmed  [x] Left mess   [] No answer/No voice mail  [] VM Full/unable to leave message  [] Phone not in service  Patient reminded to bring all medications and/or complete list.  Confirmed patient has transportation. Gave directions, instructed to utilize valet parking.

## 2024-03-29 ENCOUNTER — Ambulatory Visit: Admitting: Internal Medicine

## 2024-03-29 ENCOUNTER — Other Ambulatory Visit
Admission: RE | Admit: 2024-03-29 | Discharge: 2024-03-29 | Disposition: A | Discharge status: Home / Self Care | Source: Ambulatory Visit | Attending: Internal Medicine | Admitting: Internal Medicine

## 2024-03-29 ENCOUNTER — Other Ambulatory Visit: Payer: Self-pay

## 2024-03-29 VITALS — BP 142/87 | HR 88 | Wt 211.0 lb

## 2024-03-29 DIAGNOSIS — I493 Ventricular premature depolarization: Secondary | ICD-10-CM | POA: Insufficient documentation

## 2024-03-29 DIAGNOSIS — Z7982 Long term (current) use of aspirin: Secondary | ICD-10-CM | POA: Diagnosis not present

## 2024-03-29 DIAGNOSIS — I5022 Chronic systolic (congestive) heart failure: Secondary | ICD-10-CM | POA: Diagnosis not present

## 2024-03-29 DIAGNOSIS — I2510 Atherosclerotic heart disease of native coronary artery without angina pectoris: Secondary | ICD-10-CM

## 2024-03-29 DIAGNOSIS — Z7984 Long term (current) use of oral hypoglycemic drugs: Secondary | ICD-10-CM | POA: Insufficient documentation

## 2024-03-29 DIAGNOSIS — Z79899 Other long term (current) drug therapy: Secondary | ICD-10-CM | POA: Insufficient documentation

## 2024-03-29 DIAGNOSIS — I5042 Chronic combined systolic (congestive) and diastolic (congestive) heart failure: Secondary | ICD-10-CM | POA: Diagnosis not present

## 2024-03-29 DIAGNOSIS — R5383 Other fatigue: Secondary | ICD-10-CM | POA: Diagnosis not present

## 2024-03-29 DIAGNOSIS — I110 Hypertensive heart disease with heart failure: Secondary | ICD-10-CM | POA: Diagnosis not present

## 2024-03-29 DIAGNOSIS — Z720 Tobacco use: Secondary | ICD-10-CM

## 2024-03-29 DIAGNOSIS — Z955 Presence of coronary angioplasty implant and graft: Secondary | ICD-10-CM | POA: Diagnosis not present

## 2024-03-29 DIAGNOSIS — Z7902 Long term (current) use of antithrombotics/antiplatelets: Secondary | ICD-10-CM | POA: Insufficient documentation

## 2024-03-29 DIAGNOSIS — R0683 Snoring: Secondary | ICD-10-CM

## 2024-03-29 DIAGNOSIS — F17210 Nicotine dependence, cigarettes, uncomplicated: Secondary | ICD-10-CM | POA: Insufficient documentation

## 2024-03-29 DIAGNOSIS — I252 Old myocardial infarction: Secondary | ICD-10-CM | POA: Diagnosis not present

## 2024-03-29 LAB — BASIC METABOLIC PANEL WITH GFR
Anion gap: 13 (ref 5–15)
BUN: 14 mg/dL (ref 6–20)
CO2: 23 mmol/L (ref 22–32)
Calcium: 9.3 mg/dL (ref 8.9–10.3)
Chloride: 102 mmol/L (ref 98–111)
Creatinine, Ser: 0.9 mg/dL (ref 0.61–1.24)
GFR, Estimated: >60 mL/min (ref >60)
Glucose, Bld: 95 mg/dL (ref 70–99)
Potassium: 3.5 mmol/L (ref 3.5–5.1)
Sodium: 138 mmol/L (ref 135–145)

## 2024-03-29 LAB — BRAIN NATRIURETIC PEPTIDE: B Natriuretic Peptide: 44.3 pg/mL (ref 0.0–100.0)

## 2024-03-29 MED ORDER — ENTRESTO 49-51 MG PO TABS
1.0000 | ORAL_TABLET | Freq: Two times a day (BID) | ORAL | 11 refills | Status: AC
  Filled 2024-03-29: qty 60, 30d supply, fill #0
  Filled 2024-04-30: qty 60, 30d supply, fill #1

## 2024-03-29 NOTE — Patient Instructions (Addendum)
 Medication Changes:  STOP Losartan   START Entresto 49/51 mg Twice daily   Lab Work:  Go over to the [facility]ICAL [facility]. Go pass the gift shop and have your blood work completed.  We will only call you if the results are abnormal or if the provider would like to make medication changes.    Testing/Procedures:  Please have your echo completed. You will check in for this at the [facility]. You have to arrive 15 MINS EARLY for preparation, otherwise you will have to reschedule.   Referrals:  You have been referred to Cardiac Rehab. They will call you to arrange your appointment  Special Instructions // Education:    Follow-Up in: 3 months with an echocardiogram   At the [facility], you and your health needs are our priority. We have a designated team specialized in the treatment of Heart Failure. This Care Team includes your primary Heart Failure Specialized Cardiologist (physician), Advanced Practice Providers (APPs- Physician Assistants and Nurse Practitioners), and Pharmacist who all work together to provide you with the care you need, when you need it.   You may see any of the following providers on your designated Care Team at your next follow up:  Dr. Toribio Fuel Dr. Ezra Shuck Dr. Ria Commander Dr. Odis Brownie Ellouise Class, FNP Jaun Bash, RPH-CPP  Please be sure to bring in all your medications bottles to every appointment.   Need to Contact Us :  If you have any questions or concerns before your next appointment please send us  a message through [place] or call our office at [phone].    TO LEAVE A MESSAGE FOR THE NURSE SELECT OPTION 2, PLEASE LEAVE A MESSAGE INCLUDING: YOUR NAME DATE OF BIRTH CALL BACK NUMBER REASON FOR CALL**this is important as we prioritize the call backs  YOU WILL RECEIVE A CALL BACK THE SAME DAY AS LONG AS YOU CALL BEFORE 4:00 PM

## 2024-03-29 NOTE — Progress Notes (Signed)
 ADVANCED HF CLINIC CONSULT NOTE  Referring Physician: Dineen Rollene MATSU, FNP Primary Care: Dineen Rollene MATSU, FNP Primary Cardiologist: Alm Clay, MD  Chief Complaint: Chronic Systolic Heart Failure  HPI:  Mr. Trumpower has been referred to [facility] by Bernardino Bring, PA-C, for evaluation for chronic systolic heart failure.   53 y/o male w/ h/o chronic systolic heart failure, frequent PVCs, CAD s/p acute inferior STEMI 08/2022 w/ cath showing severe single vessel occlusive disease w/ 100% p-mid RCA, 95%, m-dRCA w/ heavy clot burden treated w/ aspiration thrombectomy and DES. Normal left coronary system. Echo EF 35-40%, RV mildly reduced.   Echo 4/23 EF 40-45%, RV nl   Zio 6/24 high PVC burden 21%, Toprol  XL increased    Echo 10/24 EF 40-45%, RV nl   Repeat Zio 12/24 PVC burden 21%   Repeat LHC 12/24 showed widely patent RCA stents with no significant restenosis. Near normal left coronary arteries. LVEDP 14  cMRI 3/25 LVEF 30%, RVEF 41%, +global HK w/ inferior wall AK, + transmural scar in the basal-mid inferior wall (non viable). ECV and T2 values not documented.   Mexile'j nqm also added for PVC suppression   ICD implant 02/09/24   Presents to [facility] today for HF consultation. Here w/ his mother. He reports stable NYHA Class II symptoms. Limited more by fatigue than dyspnea. Denies CP. Fairly asymptomatic w/ PVCs. Denies palpitations. No syncope/ near syncope. No LEE, orthopnea/PND or wt gain.   Returns for routine f/u with his Mom. Feeling much better. Pending going back to work July 3rd at [employer] (8 weeks from ICD implant). Denies CP or SOB. Compliant with meds.    Past Medical History:  Diagnosis Date   Acute ST elevation myocardial infarction (STEMI) of inferolateral wall (HCC) 09/02/2022   Extensive thrombus in the RCA-thrombectomy with overlapping 3.5 mm stents (38 and 30 mm)   ADD (attention deficit disorder)    Alcohol abuse    History of.   Anxiety     Chronic HFrEF (heart failure with reduced ejection fraction) (HCC)    a. 09/2022 Echo: EF 35-40%, glob HK, GrI DD, mildly reduced RV fxn, RVSP 31.82mmHg, no significant valvular disease.   Coronary artery disease involving native coronary artery of native heart 08/2022   a. 08/2022 Inflat STEMI/PCI: LM nl, LAD nl, D1/2 nl, RI nl, LCX nl, OM1/2 nl, RCA 100p/m (thrombectomy & 3.5x38 & 3.5x30 overlapping Onyx Frontier DESs), 43m/d (thombectomy and PTCA into RPAV branch), RPDA 100, RPL1 100, EF 45-50%.   Depression    Frequent PVCs 02/17/2023   Hyperlipidemia    Hypertension    Ischemic cardiomyopathy    a. 09/2022 Echo: EF 35-40%.   Ischemic cardiomyopathy 02/17/2023   09/2022 (inferior STEMI) echo: EF 35-40%, glob HK, GrI DD, mildly reduced RV fxn, RVSP 31.102mmHg, no significant valvular disease.  TTE follow-up 01/06/2023: EF remains 40 to 45% with mild to moderate dysfunction       Current Outpatient Medications  Medication Sig Dispense Refill   aspirin  81 MG chewable tablet Chew 1 tablet (81 mg total) by mouth daily. 30 tablet 1   buPROPion  (WELLBUTRIN  XL) 150 MG 24 hr tablet Take 1 tablet (150 mg total) by mouth every morning for 3 days, THEN 2 tablets (300 mg total) every morning. (Patient taking differently:  2 tablets (300 mg total) every morning.) 90 tablet 1   busPIRone  (BUSPAR ) 5 MG tablet Take 1 tablet (5 mg total) by mouth 3 (three) times daily. 90 tablet 2  cetirizine (ZYRTEC) 10 MG tablet Take 10 mg by mouth daily.     cholecalciferol (VITAMIN D3) 25 MCG (1000 UNIT) tablet Take 1,000 Units by mouth daily.     cyanocobalamin  (VITAMIN B12) 1000 MCG tablet Take 1,000 mcg by mouth daily.     dapagliflozin  propanediol (FARXIGA ) 10 MG TABS tablet Take 1 tablet (10 mg total) by mouth daily. 30 tablet 2   folic acid  (FOLVITE ) 1 MG tablet Take 1 mg by mouth daily.     metoprolol  succinate (TOPROL -XL) 100 MG 24 hr tablet Take 1 tablet (100 mg total) by mouth daily. Take 100 mg by mouth daily.  Take with or immediately following a meal. 90 tablet 3   mexiletine (MEXITIL ) 200 MG capsule Take 1 capsule (200 mg total) by mouth 2 (two) times daily. (Patient taking differently: Take 200 mg by mouth every morning.) 180 capsule 3   mirtazapine  (REMERON ) 15 MG tablet Take 1 tablet (15 mg total) by mouth at bedtime. 90 tablet 3   rosuvastatin  (CRESTOR ) 40 MG tablet Take 1 tablet (40 mg total) by mouth daily. 30 tablet 6   sertraline  (ZOLOFT ) 100 MG tablet Take 1 tablet (100 mg total) by mouth daily. 90 tablet 3   sertraline  (ZOLOFT ) 25 MG tablet Take 1 tablet (25 mg total) by mouth daily. 30 tablet 3   ticagrelor  (BRILINTA ) 60 MG TABS tablet Take 1 tablet (60 mg total) by mouth 2 (two) times daily. 60 tablet 11   triamcinolone  ointment (KENALOG ) 0.5 % Apply 1 Application topically 2 (two) times daily. Use sparingly for < 1 week on hands with vaseline 15 g 2   losartan  (COZAAR ) 50 MG tablet Take 50 mg by mouth daily. (Patient not taking: Reported on 03/29/2024)     No current facility-administered medications for this visit.    No Known Allergies    Social History   Socioeconomic History   Marital status: Divorced    Spouse name: Abbott Jasinski   Number of children: 2   Years of education: 14   Highest education level: Not on file  Occupational History    Comment: [employer]  Tobacco Use   Smoking status: Some Days    Current packs/day: 0.00    Average packs/day: 0.3 packs/day for 20.0 years (5.0 ttl pk-yrs)    Types: Cigarettes    Start date: 09/02/2002    Last attempt to quit: 09/02/2022    Years since quitting: 1.5   Smokeless tobacco: Never   Tobacco comments:    1/2 PPW; 02/17/2023  Vaping Use   Vaping status: Never Used  Substance and Sexual Activity   Alcohol use: No    Comment: no alcohol since 12/16/2012   Drug use: No   Sexual activity: Yes    Partners: Female  Other Topics Concern   Not on file  Social History Narrative   Divorced in 08/2020; they  have  two daughters (22 and 30 )    In the past-Rebuilds induction coils for work   05/2022- started [employer]   Social Drivers of Health   Financial Resource Strain: Not on file  Food Insecurity: No Food Insecurity (02/09/2024)   Hunger Vital Sign    Worried About Running Out of Food in the Last Year: Never true    Ran Out of Food in the Last Year: Never true  Transportation Needs: No Transportation Needs (02/09/2024)   PRAPARE - Transportation    Lack of Transportation (Medical): No    Lack  of Transportation (Non-Medical): No  Physical Activity: Not on file  Stress: Not on file  Social Connections: Not on file  Intimate Partner Violence: Unknown (02/09/2024)   Humiliation, Afraid, Rape, and Kick questionnaire    Fear of Current or Ex-Partner: Not on file    Emotionally Abused: No    Physically Abused: No    Sexually Abused: No      Family History  Problem Relation Age of Onset   Prostate cancer Father    Hyperlipidemia Father    Hypertension Father    Lung cancer Maternal Grandfather    Heart attack Maternal Grandfather 56       died   Alcohol abuse Paternal Grandfather    Hypertension Cousin    Hyperlipidemia Other    Heart attack Paternal Uncle 37       died   Heart attack Maternal Great-grandfather 63   Alcohol abuse Maternal Aunt     Vitals:   03/29/24 1513  BP: (!) 142/87  Pulse: 88  SpO2: 97%  Weight: 211 lb (95.7 kg)    PHYSICAL EXAM: General:  Well appearing. No resp difficulty HEENT: normal Neck: supple. no JVD. Carotids 2+ bilat; no bruits. No lymphadenopathy or thryomegaly appreciated. Cor: PMI nondisplaced. Regular rate & rhythm. No rubs, gallops or murmurs. Lungs: clear Abdomen: soft, nontender, nondistended. No hepatosplenomegaly. No bruits or masses. Good bowel sounds. Extremities: no cyanosis, clubbing, rash, edema Neuro: alert & orientedx3, cranial nerves grossly intact. moves all 4 extremities w/o difficulty. Affect pleasant  ECG: NSR  w/ 2 PVCs, personally reviewed    ASSESSMENT & PLAN:  1. Chronic Systolic Heart Failure - Ischemic +/- mixed nonischemic CM w/ ? PVC mediated component. Uncertain if PVCs are contributing to CM vs 2/2 cardiomyopathy/scar   - Peri MI echo 12/23 EF 35-40%, RV mildly reduce - Limited echo 4/24 EF 40-45%, RV nl  - Echo 10/24 EF 40-45%, RV nl  - Repeat LHC 12/24 widely patent RCA stents, normal left coronary system - cMRI 3/25 LVEF 30%, RVEF 41%, + transmural LGE scar in the basal-mid inferior wall (non viable). ECV and T2 values not documented.  - Zios  have shown high PVC burden (21%)  - s/p BSCi ICD 02/09/24 Marny) ICD interrogation today No AF/VT Personally reviewed - Improved NYHA I - Has been off losartan  50. Will switch to Entresto 49/51 bid - Restart Farxiga  10 mg daily  - Continue Toprol  Xl 100 mg daily  - Consider spiro next visit - Refer CR  2. Frequent PVCs - Zio 6/24 21% burden - Zio 11/24 21% burden >>LHC w/ no obstructive CAD - Continue mexilitene 200 bid - seen by EP, suspect scar mediated from prior inferior infarct - need sleep study to r/o OSA as well l- Has device. Needs insurance authorization. Will check today - stay off Adderall    3. CAD - s/p inferior MI 11/23, cath 1V CAD. S/p p-mRCA and m-dRCA stents  - patent RCA stents on repeat cath 12/24 - continue ASA 81 mg  - continue Crestor  40 mg  - no s/s angina  4. Tobacco abuse - smoking 1ppw  - discussed cessation  5. HTN - starting Entresto  OK for RTW. Discussed ICD restrictions with EP app to confirm. Letter provided.   Toribio Fuel, MD  3:34 PM

## 2024-03-30 ENCOUNTER — Other Ambulatory Visit: Payer: Self-pay

## 2024-04-03 ENCOUNTER — Encounter (INDEPENDENT_AMBULATORY_CARE_PROVIDER_SITE_OTHER): Payer: Self-pay | Admitting: Cardiology

## 2024-04-03 DIAGNOSIS — G4733 Obstructive sleep apnea (adult) (pediatric): Secondary | ICD-10-CM

## 2024-04-12 ENCOUNTER — Encounter: Payer: Self-pay | Admitting: *Deleted

## 2024-04-12 ENCOUNTER — Ambulatory Visit

## 2024-04-12 ENCOUNTER — Encounter: Attending: Internal Medicine | Admitting: *Deleted

## 2024-04-12 DIAGNOSIS — Z5189 Encounter for other specified aftercare: Secondary | ICD-10-CM | POA: Insufficient documentation

## 2024-04-12 DIAGNOSIS — F17210 Nicotine dependence, cigarettes, uncomplicated: Secondary | ICD-10-CM | POA: Insufficient documentation

## 2024-04-12 DIAGNOSIS — I5022 Chronic systolic (congestive) heart failure: Secondary | ICD-10-CM | POA: Insufficient documentation

## 2024-04-12 NOTE — Progress Notes (Signed)
 Virtual orientation call completed today. he has an appointment on Date: 04/23/2024 for EP eval and gym Orientation.  Documentation of diagnosis can be found in [facility]  03/29/2024.  Ludie is a current tobacco user. Intervention for tobacco cessation was provided at the initial medical review. He was asked about readiness to quit and reported that he is slowly weaning down to no cigarettes.  He is taking wellbutrin  and he is hoping he will lose the desire/taste for the cigarettes.  . Patient was advised and educated about tobacco cessation using combination therapy, tobacco cessation classes, quit line, and quit smoking apps. Patient demonstrated understanding of this material. Staff will continue to provide encouragement and follow up with the patient throughout the program.

## 2024-04-15 NOTE — Procedures (Signed)
   SLEEP STUDY REPORT Patient Information Study Date: 04/03/2024 Patient Name: Shane [name] Patient ID: [id number] Birth Date: July 03, 1971 Age: 53 Gender: BMI: 28.4 (W=209 lb, H=6' 0'') Stopbang: 5 Referring Physician: Rolan Fuel, MD  TEST DESCRIPTION: Home sleep apnea testing was completed using the WatchPat, a Type 1 device, utilizing peripheral arterial tonometry (PAT), chest movement, actigraphy, pulse oximetry, pulse rate, body position and snore. AHI was calculated with apnea and hypopnea using valid sleep time as the denominator. RDI includes apneas, hypopneas, and RERAs. The data acquired and the scoring of sleep and all associated events were performed in accordance with the recommended standards and specifications as outlined in the AASM Manual for the Scoring of Sleep and Associated Events 2.2.0 (2015). FINDINGS:  1. Mild Obstructive Sleep Apnea with AHI 6.5/hr.  2. No Central Sleep Apnea with pAHIc 1.9/hr.  3. Oxygen desaturations as low as 89%.  4. Moderate to severe snoring was present. O2 sats were < 88% for 0 min.  5. Total sleep time was 7 hrs and 22 min.  6. 17.9% of total sleep time was spent in REM sleep.  7. Shortened sleep onset latency at 6 min.  8. Prolonged REM sleep onset latency at 328 min.  9. Total awakenings were 28. 10. Arrhythmia detection: Suggestive of possible brief atrial fibrillation lasting 45 seconds. This is not diagnostic and further testing with outpatient telemetry monitoring is recommended.  DIAGNOSIS: Mild Obstructive Sleep Apnea (G47.33) Possible Atrial Fibrillation  RECOMMENDATIONS: 1. Clinical correlation of these findings is necessary. The decision to treat obstructive sleep apnea (OSA) is usually based on the presence of apnea symptoms or the presence of associated medical conditions such as Hypertension, Congestive Heart Failure, Atrial Fibrillation or Obesity. The most common symptoms of OSA are snoring, gasping  for breath while sleeping, daytime sleepiness and fatigue. 2. Initiating apnea therapy is recommended given the presence of symptoms and/or associated conditions. Recommend proceeding with one of the following:  a. Auto-CPAP therapy with a pressure range of 5-20cm H2O.  b. An oral appliance (OA) that can be obtained from certain dentists with expertise in sleep medicine. These are primarily of use in non-obese patients with mild and moderate disease.  c. An ENT consultation which may be useful to look for specific causes of obstruction and possible treatment options.  d. If patient is intolerant to PAP therapy, consider referral to ENT for evaluation for hypoglossal nerve stimulator. 3. Close follow-up is necessary to ensure success with CPAP or oral appliance therapy for maximum benefit . 4. A follow-up oximetry study on CPAP is recommended to assess the adequacy of therapy and determine the need for supplemental oxygen or the potential need for Bi-level therapy. An arterial blood gas to determine the adequacy of baseline ventilation and oxygenation should also be considered. 5. Healthy sleep recommendations include: adequate nightly sleep (normal 7-9 hrs/night), avoidance of caffeine after noon and alcohol near bedtime, and maintaining a sleep environment that is cool, dark and quiet. 6. Weight loss for overweight patients is recommended. Even modest amounts of weight loss can significantly improve the severity of sleep apnea. 7. Snoring recommendations include: weight loss where appropriate, side sleeping, and avoidance of alcohol before bed. 8. Operation of motor vehicle should be avoided when sleepy.  Signature: Wilbert Bihari, MD; [facility]; Diplomat, American Board of Sleep Medicine Electronically Signed: 04/15/2024 4:42:53 PM

## 2024-04-16 ENCOUNTER — Ambulatory Visit: Attending: Internal Medicine

## 2024-04-16 DIAGNOSIS — R0683 Snoring: Secondary | ICD-10-CM

## 2024-04-18 ENCOUNTER — Telehealth: Payer: Self-pay

## 2024-04-18 NOTE — Telephone Encounter (Signed)
-----   Message from Wilbert Bihari sent at 04/15/2024  4:44 PM EDT ----- Patient has very mild OSA - set up OV to discuss treatment options.

## 2024-04-18 NOTE — Telephone Encounter (Signed)
 Left VM with callback number for patient to receive sleep study results and recommendations.

## 2024-04-20 ENCOUNTER — Telehealth: Payer: Self-pay

## 2024-04-20 NOTE — Telephone Encounter (Signed)
-----   Message from Wilbert Bihari sent at 04/15/2024  4:44 PM EDT ----- Patient has very mild OSA - set up OV to discuss treatment options.

## 2024-04-20 NOTE — Telephone Encounter (Signed)
 Left VM with callback number for patient to receive sleep study results and recommendations.

## 2024-04-22 ENCOUNTER — Ambulatory Visit (HOSPITAL_COMMUNITY): Payer: Self-pay | Admitting: Internal Medicine

## 2024-04-22 DIAGNOSIS — G4733 Obstructive sleep apnea (adult) (pediatric): Secondary | ICD-10-CM

## 2024-04-23 ENCOUNTER — Encounter: Admitting: *Deleted

## 2024-04-23 VITALS — Ht 74.0 in | Wt 209.7 lb

## 2024-04-23 DIAGNOSIS — Z5189 Encounter for other specified aftercare: Secondary | ICD-10-CM | POA: Diagnosis not present

## 2024-04-23 DIAGNOSIS — F17210 Nicotine dependence, cigarettes, uncomplicated: Secondary | ICD-10-CM | POA: Diagnosis not present

## 2024-04-23 DIAGNOSIS — I5022 Chronic systolic (congestive) heart failure: Secondary | ICD-10-CM

## 2024-04-23 DIAGNOSIS — I5020 Unspecified systolic (congestive) heart failure: Secondary | ICD-10-CM | POA: Diagnosis not present

## 2024-04-23 NOTE — Progress Notes (Signed)
 Cardiac Individual Treatment Plan  Patient Details  Name: Shane [name] MRN: [id number] Date of Birth: June 11, 1971 Referring Provider:   Flowsheet Row Cardiac Rehab from 04/23/2024 in [facility] Cardiac and Pulmonary Rehab  Referring Provider Dr. Toribio Fuel    Initial Encounter Date:  Flowsheet Row Cardiac Rehab from 04/23/2024 in [facility] Cardiac and Pulmonary Rehab  Date 04/23/24    Visit Diagnosis: Heart failure, chronic systolic (HCC)  Patient's Home Medications on Admission:  Current Outpatient Medications:    aspirin  81 MG chewable tablet, Chew 1 tablet (81 mg total) by mouth daily., Disp: 30 tablet, Rfl: 1   buPROPion  (WELLBUTRIN  XL) 150 MG 24 hr tablet, Take 1 tablet (150 mg total) by mouth every morning for 3 days, THEN 2 tablets (300 mg total) every morning. (Patient taking differently:  2 tablets (300 mg total) every morning.), Disp: 90 tablet, Rfl: 1   busPIRone  (BUSPAR ) 5 MG tablet, Take 1 tablet (5 mg total) by mouth 3 (three) times daily., Disp: 90 tablet, Rfl: 2   cetirizine (ZYRTEC) 10 MG tablet, Take 10 mg by mouth daily. (Patient taking differently: Take 10 mg by mouth daily. Taking as needed), Disp: , Rfl:    cholecalciferol (VITAMIN D3) 25 MCG (1000 UNIT) tablet, Take 1,000 Units by mouth daily., Disp: , Rfl:    cyanocobalamin  (VITAMIN B12) 1000 MCG tablet, Take 1,000 mcg by mouth daily., Disp: , Rfl:    dapagliflozin  propanediol (FARXIGA ) 10 MG TABS tablet, Take 1 tablet (10 mg total) by mouth daily., Disp: 30 tablet, Rfl: 2   folic acid  (FOLVITE ) 1 MG tablet, Take 1 mg by mouth daily., Disp: , Rfl:    metoprolol  succinate (TOPROL -XL) 100 MG 24 hr tablet, Take 1 tablet (100 mg total) by mouth daily. Take 100 mg by mouth daily. Take with or immediately following a meal., Disp: 90 tablet, Rfl: 3   mexiletine (MEXITIL ) 200 MG capsule, Take 1 capsule (200 mg total) by mouth 2 (two) times daily., Disp: 180 capsule, Rfl: 3   mirtazapine  (REMERON ) 15 MG tablet, Take 1 tablet  (15 mg total) by mouth at bedtime. (Patient taking differently: Take 15 mg by mouth at bedtime. Using as needed), Disp: 90 tablet, Rfl: 3   rosuvastatin  (CRESTOR ) 40 MG tablet, Take 1 tablet (40 mg total) by mouth daily., Disp: 30 tablet, Rfl: 6   sacubitril -valsartan  (ENTRESTO ) 49-51 MG, Take 1 tablet by mouth 2 (two) times daily., Disp: 60 tablet, Rfl: 11   sertraline  (ZOLOFT ) 100 MG tablet, Take 1 tablet (100 mg total) by mouth daily., Disp: 90 tablet, Rfl: 3   sertraline  (ZOLOFT ) 25 MG tablet, Take 1 tablet (25 mg total) by mouth daily., Disp: 30 tablet, Rfl: 3   ticagrelor  (BRILINTA ) 60 MG TABS tablet, Take 1 tablet (60 mg total) by mouth 2 (two) times daily., Disp: 60 tablet, Rfl: 11   triamcinolone  ointment (KENALOG ) 0.5 %, Apply 1 Application topically 2 (two) times daily. Use sparingly for < 1 week on hands with vaseline (Patient taking differently: Apply 1 Application topically 2 (two) times daily. Use sparingly for < 1 week on hands with vaseline  using as needed), Disp: 15 g, Rfl: 2  Past Medical History: Past Medical History:  Diagnosis Date   Acute ST elevation myocardial infarction (STEMI) of inferolateral wall (HCC) 09/02/2022   Extensive thrombus in the RCA-thrombectomy with overlapping 3.5 mm stents (38 and 30 mm)   ADD (attention deficit disorder)    Alcohol abuse    History of.  Anxiety    Chronic HFrEF (heart failure with reduced ejection fraction) (HCC)    a. 09/2022 Echo: EF 35-40%, glob HK, GrI DD, mildly reduced RV fxn, RVSP 31.57mmHg, no significant valvular disease.   Coronary artery disease involving native coronary artery of native heart 08/2022   a. 08/2022 Inflat STEMI/PCI: LM nl, LAD nl, D1/2 nl, RI nl, LCX nl, OM1/2 nl, RCA 100p/m (thrombectomy & 3.5x38 & 3.5x30 overlapping Onyx Frontier DESs), 39m/d (thombectomy and PTCA into RPAV branch), RPDA 100, RPL1 100, EF 45-50%.   Depression    Frequent PVCs 02/17/2023   Hyperlipidemia    Hypertension    Ischemic  cardiomyopathy    a. 09/2022 Echo: EF 35-40%.   Ischemic cardiomyopathy 02/17/2023   09/2022 (inferior STEMI) echo: EF 35-40%, glob HK, GrI DD, mildly reduced RV fxn, RVSP 31.20mmHg, no significant valvular disease.  TTE follow-up 01/06/2023: EF remains 40 to 45% with mild to moderate dysfunction       Tobacco Use: Social History   Tobacco Use  Smoking Status Some Days   Current packs/day: 0.00   Average packs/day: 0.3 packs/day for 20.0 years (5.0 ttl pk-yrs)   Types: Cigarettes   Start date: 09/02/2002   Last attempt to quit: 09/02/2022   Years since quitting: 1.6  Smokeless Tobacco Never  Tobacco Comments   I pack  20 cigarettes a week  wants to  QUIT.      Labs: Review Flowsheet  More data exists      Latest Ref Rng & Units 07/05/2022 09/03/2022 10/25/2022 02/17/2023 12/21/2023  Labs for ITP Cardiac and Pulmonary Rehab  Cholestrol 100 - 199 mg/dL - 823  827  864  813   LDL (calc) 0 - 99 mg/dL - 886  898  65  95   HDL-C >39 mg/dL - 37  44.39  57  52   Trlycerides 0 - 149 mg/dL - 867  22.9  65  768   Hemoglobin A1c 4.0 - 5.6 % 5.8  - - - -     Exercise Target Goals: Exercise Program Goal: Individual exercise prescription set using results from initial 6 min walk test and THRR while considering  patient's activity barriers and safety.   Exercise Prescription Goal: Initial exercise prescription builds to 30-45 minutes a day of aerobic activity, 2-3 days per week.  Home exercise guidelines will be given to patient during program as part of exercise prescription that the participant will acknowledge.   Education: Aerobic Exercise: - Group verbal and visual presentation on the components of exercise prescription. Introduces F.I.T.T principle from ACSM for exercise prescriptions.  Reviews F.I.T.T. principles of aerobic exercise including progression. Written material given at graduation. Flowsheet Row Cardiac Rehab from 04/23/2024 in [facility] Cardiac and Pulmonary Rehab  Education need  identified 04/23/24    Education: Resistance Exercise: - Group verbal and visual presentation on the components of exercise prescription. Introduces F.I.T.T principle from ACSM for exercise prescriptions  Reviews F.I.T.T. principles of resistance exercise including progression. Written material given at graduation.    Education: Exercise & Equipment Safety: - Individual verbal instruction and demonstration of equipment use and safety with use of the equipment. Flowsheet Row Cardiac Rehab from 04/23/2024 in [facility] Cardiac and Pulmonary Rehab  Date 04/23/24  Educator [facility]  Instruction Review Code 1- Verbalizes Understanding    Education: Exercise Physiology & General Exercise Guidelines: - Group verbal and written instruction with models to review the exercise physiology of the cardiovascular system and associated critical values. Provides general  exercise guidelines with specific guidelines to those with heart or lung disease.  Flowsheet Row Cardiac Rehab from 04/23/2024 in [facility] Cardiac and Pulmonary Rehab  Education need identified 04/23/24    Education: Flexibility, Balance, Mind/Body Relaxation: - Group verbal and visual presentation with interactive activity on the components of exercise prescription. Introduces F.I.T.T principle from ACSM for exercise prescriptions. Reviews F.I.T.T. principles of flexibility and balance exercise training including progression. Also discusses the mind body connection.  Reviews various relaxation techniques to help reduce and manage stress (i.e. Deep breathing, progressive muscle relaxation, and visualization). Balance handout provided to take home. Written material given at graduation.   Activity Barriers & Risk Stratification:  Activity Barriers & Cardiac Risk Stratification - 04/23/24 1604       Activity Barriers & Cardiac Risk Stratification   Activity Barriers Other (comment)    Cardiac Risk Stratification High          6 Minute Walk:  6 Minute  Walk     Row Name 04/23/24 1603         6 Minute Walk   Phase Initial     Distance 1700 feet     Walk Time 6 minutes     # of Rest Breaks 0     MPH 3.22     METS 4.83     RPE 7     Perceived Dyspnea  0     VO2 Peak 16.9     Symptoms No     Resting HR 73 bpm     Resting BP 138/70     Resting Oxygen Saturation  96 %     Exercise Oxygen Saturation  during 6 min walk 97 %     Max Ex. HR 100 bpm     Max Ex. BP 138/70     2 Minute Post BP 126/80        Oxygen Initial Assessment:   Oxygen Re-Evaluation:   Oxygen Discharge (Final Oxygen Re-Evaluation):   Initial Exercise Prescription:  Initial Exercise Prescription - 04/23/24 1600       Date of Initial Exercise RX and Referring Provider   Date 04/23/24    Referring Provider Dr. Daniel Bensimhon      Oxygen   Maintain Oxygen Saturation 88% or higher      Treadmill   MPH 4    Grade 1    Minutes 15    METs 4.61      Elliptical   Level 1    Minutes 15    METs 4.83      REL-XR   Level 3    Speed 50    Minutes 15    METs 4.83      T5 Nustep   Level 3    SPM 80    Minutes 15    METs 4.83      Rower   Level 4    Minutes 15    METs 4.83      Prescription Details   Frequency (times per week) 3    Duration Progress to 30 minutes of continuous aerobic without signs/symptoms of physical distress      Intensity   THRR 40-80% of Max Heartrate 111-149    Ratings of Perceived Exertion 11-13    Perceived Dyspnea 0-4      Progression   Progression Continue to progress workloads to maintain intensity without signs/symptoms of physical distress.      Resistance Training   Training Prescription Yes  Weight 7    Reps 10-15          Perform Capillary Blood Glucose checks as needed.  Exercise Prescription Changes:   Exercise Prescription Changes     Row Name 04/23/24 1600             Response to Exercise   Blood Pressure (Admit) 120/70       Blood Pressure (Exercise) 138/70       Blood  Pressure (Exit) 126/80       Heart Rate (Admit) 73 bpm       Heart Rate (Exercise) 100 bpm       Heart Rate (Exit) 71 bpm       Oxygen Saturation (Admit) 96 %       Oxygen Saturation (Exercise) 97 %       Oxygen Saturation (Exit) 97 %       Rating of Perceived Exertion (Exercise) 7       Perceived Dyspnea (Exercise) 0       Symptoms none       Comments 6 MWT results          Exercise Comments:   Exercise Goals and Review:   Exercise Goals     Row Name 04/23/24 1608             Exercise Goals   Increase Physical Activity Yes       Intervention Provide advice, education, support and counseling about physical activity/exercise needs.;Develop an individualized exercise prescription for aerobic and resistive training based on initial evaluation findings, risk stratification, comorbidities and participant's personal goals.       Expected Outcomes Short Term: Attend rehab on a regular basis to increase amount of physical activity.;Long Term: Add in home exercise to make exercise part of routine and to increase amount of physical activity.;Long Term: Exercising regularly at least 3-5 days a week.       Increase Strength and Stamina Yes       Intervention Provide advice, education, support and counseling about physical activity/exercise needs.;Develop an individualized exercise prescription for aerobic and resistive training based on initial evaluation findings, risk stratification, comorbidities and participant's personal goals.       Expected Outcomes Short Term: Increase workloads from initial exercise prescription for resistance, speed, and METs.;Short Term: Perform resistance training exercises routinely during rehab and add in resistance training at home;Long Term: Improve cardiorespiratory fitness, muscular endurance and strength as measured by increased METs and functional capacity ( )       Able to understand and use rate of perceived exertion (RPE) scale Yes       Intervention  Provide education and explanation on how to use RPE scale       Expected Outcomes Short Term: Able to use RPE daily in rehab to express subjective intensity level;Long Term:  Able to use RPE to guide intensity level when exercising independently       Able to understand and use Dyspnea scale Yes       Intervention Provide education and explanation on how to use Dyspnea scale       Expected Outcomes Short Term: Able to use Dyspnea scale daily in rehab to express subjective sense of shortness of breath during exertion;Long Term: Able to use Dyspnea scale to guide intensity level when exercising independently       Knowledge and understanding of Target Heart Rate Range (THRR) Yes       Intervention Provide education and explanation of THRR including how  the numbers were predicted and where they are located for reference       Expected Outcomes Short Term: Able to state/look up THRR;Long Term: Able to use THRR to govern intensity when exercising independently;Short Term: Able to use daily as guideline for intensity in rehab       Able to check pulse independently Yes       Intervention Provide education and demonstration on how to check pulse in carotid and radial arteries.;Review the importance of being able to check your own pulse for safety during independent exercise       Expected Outcomes Short Term: Able to explain why pulse checking is important during independent exercise;Long Term: Able to check pulse independently and accurately       Understanding of Exercise Prescription Yes       Intervention Provide education, explanation, and written materials on patient's individual exercise prescription       Expected Outcomes Short Term: Able to explain program exercise prescription;Long Term: Able to explain home exercise prescription to exercise independently          Exercise Goals Re-Evaluation :   Discharge Exercise Prescription (Final Exercise Prescription Changes):  Exercise Prescription  Changes - 04/23/24 1600       Response to Exercise   Blood Pressure (Admit) 120/70    Blood Pressure (Exercise) 138/70    Blood Pressure (Exit) 126/80    Heart Rate (Admit) 73 bpm    Heart Rate (Exercise) 100 bpm    Heart Rate (Exit) 71 bpm    Oxygen Saturation (Admit) 96 %    Oxygen Saturation (Exercise) 97 %    Oxygen Saturation (Exit) 97 %    Rating of Perceived Exertion (Exercise) 7    Perceived Dyspnea (Exercise) 0    Symptoms none    Comments 6 MWT results          Nutrition:  Target Goals: Understanding of nutrition guidelines, daily intake of sodium 1500mg , cholesterol 200mg , calories 30% from fat and 7% or less from saturated fats, daily to have 5 or more servings of fruits and vegetables.  Education: All About Nutrition: -Group instruction provided by verbal, written material, interactive activities, discussions, models, and posters to present general guidelines for heart healthy nutrition including fat, fiber, MyPlate, the role of sodium in heart healthy nutrition, utilization of the nutrition label, and utilization of this knowledge for meal planning. Follow up email sent as well. Written material given at graduation. Flowsheet Row Cardiac Rehab from 04/23/2024 in [facility] Cardiac and Pulmonary Rehab  Education need identified 04/23/24    Biometrics:  Pre Biometrics - 04/23/24 1609       Pre Biometrics   Height 6' 2 (1.88 m)    Weight 209 lb 11.2 oz (95.1 kg)    Waist Circumference 38.5 inches    Hip Circumference 40 inches    Waist to Hip Ratio 0.96 %    BMI (Calculated) 26.91    Single Leg Stand 22.03 seconds           Nutrition Therapy Plan and Nutrition Goals:   Nutrition Assessments:  MEDIFICTS Score Key: >=70 Need to make dietary changes  40-70 Heart Healthy Diet <= 40 Therapeutic Level Cholesterol Diet   Picture Your Plate Scores: <59 Unhealthy dietary pattern with much room for improvement. 41-50 Dietary pattern unlikely to meet  recommendations for good health and room for improvement. 51-60 More healthful dietary pattern, with some room for improvement.  >60 Healthy dietary pattern, although there  may be some specific behaviors that could be improved.    Nutrition Goals Re-Evaluation:   Nutrition Goals Discharge (Final Nutrition Goals Re-Evaluation):   Psychosocial: Target Goals: Acknowledge presence or absence of significant depression and/or stress, maximize coping skills, provide positive support system. Participant is able to verbalize types and ability to use techniques and skills needed for reducing stress and depression.   Education: Stress, Anxiety, and Depression - Group verbal and visual presentation to define topics covered.  Reviews how body is impacted by stress, anxiety, and depression.  Also discusses healthy ways to reduce stress and to treat/manage anxiety and depression.  Written material given at graduation.   Education: Sleep Hygiene -Provides group verbal and written instruction about how sleep can affect your health.  Define sleep hygiene, discuss sleep cycles and impact of sleep habits. Review good sleep hygiene tips.    Initial Review & Psychosocial Screening:  Initial Psych Review & Screening - 04/12/24 1448       Initial Review   Comments recent divorce  2021  does not want to talk about this topic          Quality of Life Scores:   Quality of Life - 04/23/24 1611       Quality of Life   Select Quality of Life      Quality of Life Scores   Health/Function Pre 22.4 %    Socioeconomic Pre 29.14 %    Psych/Spiritual Pre 24 %    Family Pre 28.5 %    GLOBAL Pre 24.91 %         Scores of 19 and below usually indicate a poorer quality of life in these areas.  A difference of  2-3 points is a clinically meaningful difference.  A difference of 2-3 points in the total score of the Quality of Life Index has been associated with significant improvement in overall quality of  life, self-image, physical symptoms, and general health in studies assessing change in quality of life.  PHQ-9: Review Flowsheet  More data exists      04/23/2024 01/11/2024 12/21/2023 10/19/2023 06/27/2023  Depression screen PHQ 2/9  Decreased Interest 0 0 0 0 0  Down, Depressed, Hopeless 0 0 0 0 0  PHQ - 2 Score 0 0 0 0 0  Altered sleeping 1 0 0 - -  Tired, decreased energy 0 0 0 - -  Change in appetite 0 0 0 - -  Feeling bad or failure about yourself  0 0 0 - -  Trouble concentrating 0 0 0 - -  Moving slowly or fidgety/restless 0 0 0 - -  Suicidal thoughts 0 0 0 - -  PHQ-9 Score 1 0 0 - -  Difficult doing work/chores Not difficult at all Not difficult at all Not difficult at all - -   Interpretation of Total Score  Total Score Depression Severity:  1-4 = Minimal depression, 5-9 = Mild depression, 10-14 = Moderate depression, 15-19 = Moderately severe depression, 20-27 = Severe depression   Psychosocial Evaluation and Intervention:  Psychosocial Evaluation - 04/12/24 1450       Psychosocial Evaluation & Interventions   Comments Shane [name] has no barriers to attending the program. CUrrently he is living with his parens while his air conditioning is fixed at his home.  He normally lives alone.  His parents and 2 adult daughters are his support.   He has ADHD and OCD and his Adderal was stopped.   He is taking other  meds in place of the adderal. The meds will help also to contorl his anxiety symptoms. He is trying to quit smoking cigarettes.  He is on meds that should help with his cessation;he is down to a pack a week and is not smoking the entire cigarette.   He is ready to start the program    Expected Outcomes STG attend all scheduled session,  continue to work on tobacco cessation, work on exericse progression , take meds for anxiety as prescribed  LTG   has quit tobacco completely, continues to take meds for anxiety symptom control.    Continue Psychosocial Services  Follow up required by  staff          Psychosocial Re-Evaluation:   Psychosocial Discharge (Final Psychosocial Re-Evaluation):   Vocational Rehabilitation: Provide vocational rehab assistance to qualifying candidates.   Vocational Rehab Evaluation & Intervention:   Education: Education Goals: Education classes will be provided on a variety of topics geared toward better understanding of heart health and risk factor modification. Participant will state understanding/return demonstration of topics presented as noted by education test scores.  Learning Barriers/Preferences:  Learning Barriers/Preferences - 04/12/24 1354       Learning Barriers/Preferences   Learning Barriers None    Learning Preferences None          General Cardiac Education Topics:  AED/CPR: - Group verbal and written instruction with the use of models to demonstrate the basic use of the AED with the basic ABC's of resuscitation.   Anatomy and Cardiac Procedures: - Group verbal and visual presentation and models provide information about basic cardiac anatomy and function. Reviews the testing methods done to diagnose heart disease and the outcomes of the test results. Describes the treatment choices: Medical Management, Angioplasty, or Coronary Bypass Surgery for treating various heart conditions including Myocardial Infarction, Angina, Valve Disease, and Cardiac Arrhythmias.  Written material given at graduation.   Medication Safety: - Group verbal and visual instruction to review commonly prescribed medications for heart and lung disease. Reviews the medication, class of the drug, and side effects. Includes the steps to properly store meds and maintain the prescription regimen.  Written material given at graduation.   Intimacy: - Group verbal instruction through game format to discuss how heart and lung disease can affect sexual intimacy. Written material given at graduation..   Know Your Numbers and Heart Failure: -  Group verbal and visual instruction to discuss disease risk factors for cardiac and pulmonary disease and treatment options.  Reviews associated critical values for Overweight/Obesity, Hypertension, Cholesterol, and Diabetes.  Discusses basics of heart failure: signs/symptoms and treatments.  Introduces Heart Failure Zone chart for action plan for heart failure.  Written material given at graduation.   Infection Prevention: - Provides verbal and written material to individual with discussion of infection control including proper hand washing and proper equipment cleaning during exercise session. Flowsheet Row Cardiac Rehab from 04/23/2024 in [facility] Cardiac and Pulmonary Rehab  Date 04/23/24  Educator [facility]  Instruction Review Code 1- Verbalizes Understanding    Falls Prevention: - Provides verbal and written material to individual with discussion of falls prevention and safety. Flowsheet Row Cardiac Rehab from 04/23/2024 in [facility] Cardiac and Pulmonary Rehab  Date 04/23/24  Educator [facility]  Instruction Review Code 1- Verbalizes Understanding    Other: -Provides group and verbal instruction on various topics (see comments)   Knowledge Questionnaire Score:  Knowledge Questionnaire Score - 04/23/24 1611       Knowledge Questionnaire Score  Pre Score 23/26          Core Components/Risk Factors/Patient Goals at Admission:  Personal Goals and Risk Factors at Admission - 04/23/24 1602       Core Components/Risk Factors/Patient Goals on Admission    Weight Management Yes    Intervention Weight Management: Develop a combined nutrition and exercise program designed to reach desired caloric intake, while maintaining appropriate intake of nutrient and fiber, sodium and fats, and appropriate energy expenditure required for the weight goal.;Weight Management: Provide education and appropriate resources to help participant work on and attain dietary goals.    Admit Weight 209 lb 11.2 oz (95.1 kg)     Goal Weight: Short Term 205 lb (93 kg)    Goal Weight: Long Term 195 lb (88.5 kg)    Expected Outcomes Short Term: Continue to assess and modify interventions until short term weight is achieved;Long Term: Adherence to nutrition and physical activity/exercise program aimed toward attainment of established weight goal;Weight Loss: Understanding of general recommendations for a balanced deficit meal plan, which promotes 1-2 lb weight loss per week and includes a negative energy balance of [phone] kcal/d;Understanding of distribution of calorie intake throughout the day with the consumption of 4-5 meals/snacks;Understanding recommendations for meals to include 15-35% energy as protein, 25-35% energy from fat, 35-60% energy from carbohydrates, less than 200mg  of dietary cholesterol, 20-35 gm of total fiber daily    Tobacco Cessation Yes    Number of packs per day Shane [name] is a current tobacco user. Intervention for tobacco cessation was provided at the initial medical review. He was asked about readiness to quit and reported that he is slowly weaning down to no cigarettes.  He is taking wellbutrin  and he is hoping he will lose the desire/taste for the cigarettes.  . Patient was advised and educated about tobacco cessation using combination therapy, tobacco cessation classes, quit line, and quit smoking apps. Patient demonstrated understanding of this material. Staff will continue to provide encouragement and follow up with the patient throughout the program.    Intervention Assist the participant in steps to quit. Provide individualized education and counseling about committing to Tobacco Cessation, relapse prevention, and pharmacological support that can be provided by physician.;Education officer, environmental, assist with locating and accessing local/national Quit Smoking programs, and support quit date choice.    Expected Outcomes Short Term: Will demonstrate readiness to quit, by selecting a quit date.;Short  Term: Will quit all tobacco product use, adhering to prevention of relapse plan.;Long Term: Complete abstinence from all tobacco products for at least 12 months from quit date.    Heart Failure Yes    Intervention Provide a combined exercise and nutrition program that is supplemented with education, support and counseling about heart failure. Directed toward relieving symptoms such as shortness of breath, decreased exercise tolerance, and extremity edema.    Expected Outcomes Improve functional capacity of life;Short term: Attendance in program 2-3 days a week with increased exercise capacity. Reported lower sodium intake. Reported increased fruit and vegetable intake. Reports medication compliance.;Short term: Daily weights obtained and reported for increase. Utilizing diuretic protocols set by physician.;Long term: Adoption of self-care skills and reduction of barriers for early signs and symptoms recognition and intervention leading to self-care maintenance.    Hypertension Yes    Intervention Provide education on lifestyle modifcations including regular physical activity/exercise, weight management, moderate sodium restriction and increased consumption of fresh fruit, vegetables, and low fat dairy, alcohol moderation, and smoking cessation.;Monitor prescription use compliance.  Expected Outcomes Short Term: Continued assessment and intervention until BP is < 140/54mm HG in hypertensive participants. < 130/84mm HG in hypertensive participants with diabetes, heart failure or chronic kidney disease.;Long Term: Maintenance of blood pressure at goal levels.    Lipids Yes    Intervention Provide education and support for participant on nutrition & aerobic/resistive exercise along with prescribed medications to achieve LDL 70mg , HDL >40mg .    Expected Outcomes Short Term: Participant states understanding of desired cholesterol values and is compliant with medications prescribed. Participant is following  exercise prescription and nutrition guidelines.;Long Term: Cholesterol controlled with medications as prescribed, with individualized exercise RX and with personalized nutrition plan. Value goals: LDL < 70mg , HDL > 40 mg.          Education:Diabetes - Individual verbal and written instruction to review signs/symptoms of diabetes, desired ranges of glucose level fasting, after meals and with exercise. Acknowledge that pre and post exercise glucose checks will be done for 3 sessions at entry of program.   Core Components/Risk Factors/Patient Goals Review:    Core Components/Risk Factors/Patient Goals at Discharge (Final Review):    ITP Comments:  ITP Comments     Row Name 04/12/24 1444 04/23/24 1600         ITP Comments Virtual orientation call completed today. he has an appointment on Date: 04/23/2024  for EP eval and gym Orientation.  Documentation of diagnosis can be found in [facility]  03/29/2024.   Shane [name] is a current tobacco user. Intervention for tobacco cessation was provided at the initial medical review. He was asked about readiness to quit and reported that he is slowly weaning down to no cigarettes.  He is taking wellbutrin  and he is hoping he will lose the desire/taste for the cigarettes.  . Patient was advised and educated about tobacco cessation using combination therapy, tobacco cessation classes, quit line, and quit smoking apps. Patient demonstrated understanding of this material. Staff will continue to provide encouragement and follow up with the patient throughout the program. Completed and gym orientation for cardiac rehab. Initial ITP created and sent for review to Dr. Faud Aleskerov, Medical Director.Shane [name] is a current tobacco user. Intervention for tobacco cessation was provided at the initial medical review. He was asked about readiness to quit and reported that he is slowly weaning down to no cigarettes.  He is taking wellbutrin  and he is hoping he will lose the  desire/taste for the cigarettes.  . Patient was advised and educated about tobacco cessation using combination therapy, tobacco cessation classes, quit line, and quit smoking apps. Patient demonstrated understanding of this material. Staff will continue to provide encouragement and follow up with the patient throughout the program.         Comments: initial ITP

## 2024-04-23 NOTE — Patient Instructions (Signed)
 Patient Instructions  Patient Details  Name: Shane [name] MRN: [id number] Date of Birth: 17-Nov-1970 Referring Provider:  Cherrie Toribio SAUNDERS, MD  Below are your personal goals for exercise, nutrition, and risk factors. Our goal is to help you stay on track towards obtaining and maintaining these goals. We will be discussing your progress on these goals with you throughout the program.  Initial Exercise Prescription:  Initial Exercise Prescription - 04/23/24 1600       Date of Initial Exercise RX and Referring Provider   Date 04/23/24    Referring Provider Dr. Daniel Bensimhon      Oxygen   Maintain Oxygen Saturation 88% or higher      Treadmill   MPH 4    Grade 1    Minutes 15    METs 4.61      Elliptical   Level 1    Minutes 15    METs 4.83      REL-XR   Level 3    Speed 50    Minutes 15    METs 4.83      T5 Nustep   Level 3    SPM 80    Minutes 15    METs 4.83      Rower   Level 4    Minutes 15    METs 4.83      Prescription Details   Frequency (times per week) 3    Duration Progress to 30 minutes of continuous aerobic without signs/symptoms of physical distress      Intensity   THRR 40-80% of Max Heartrate 111-149    Ratings of Perceived Exertion 11-13    Perceived Dyspnea 0-4      Progression   Progression Continue to progress workloads to maintain intensity without signs/symptoms of physical distress.      Resistance Training   Training Prescription Yes    Weight 7    Reps 10-15          Exercise Goals: Frequency: Be able to perform aerobic exercise two to three times per week in program working toward 2-5 days per week of home exercise.  Intensity: Work with a perceived exertion of 11 (fairly light) - 15 (hard) while following your exercise prescription.  We will make changes to your prescription with you as you progress through the program.   Duration: Be able to do 30 to 45 minutes of continuous aerobic exercise in addition to a 5  minute warm-up and a 5 minute cool-down routine.   Nutrition Goals: Your personal nutrition goals will be established when you do your nutrition analysis with the dietician.  The following are general nutrition guidelines to follow: Cholesterol < 200mg /day Sodium < 1500mg /day Fiber: Men over 50 yrs - 30 grams per day  Personal Goals:  Personal Goals and Risk Factors at Admission - 04/23/24 1602       Core Components/Risk Factors/Patient Goals on Admission    Weight Management Yes    Intervention Weight Management: Develop a combined nutrition and exercise program designed to reach desired caloric intake, while maintaining appropriate intake of nutrient and fiber, sodium and fats, and appropriate energy expenditure required for the weight goal.;Weight Management: Provide education and appropriate resources to help participant work on and attain dietary goals.    Admit Weight 209 lb 11.2 oz (95.1 kg)    Goal Weight: Short Term 205 lb (93 kg)    Goal Weight: Long Term 195 lb (88.5 kg)    Expected Outcomes  Short Term: Continue to assess and modify interventions until short term weight is achieved;Long Term: Adherence to nutrition and physical activity/exercise program aimed toward attainment of established weight goal;Weight Loss: Understanding of general recommendations for a balanced deficit meal plan, which promotes 1-2 lb weight loss per week and includes a negative energy balance of [phone] kcal/d;Understanding of distribution of calorie intake throughout the day with the consumption of 4-5 meals/snacks;Understanding recommendations for meals to include 15-35% energy as protein, 25-35% energy from fat, 35-60% energy from carbohydrates, less than 200mg  of dietary cholesterol, 20-35 gm of total fiber daily    Tobacco Cessation Yes    Number of packs per day Ludie is a current tobacco user. Intervention for tobacco cessation was provided at the initial medical review. He was asked about  readiness to quit and reported that he is slowly weaning down to no cigarettes.  He is taking wellbutrin  and he is hoping he will lose the desire/taste for the cigarettes.  . Patient was advised and educated about tobacco cessation using combination therapy, tobacco cessation classes, quit line, and quit smoking apps. Patient demonstrated understanding of this material. Staff will continue to provide encouragement and follow up with the patient throughout the program.    Intervention Assist the participant in steps to quit. Provide individualized education and counseling about committing to Tobacco Cessation, relapse prevention, and pharmacological support that can be provided by physician.;Education officer, environmental, assist with locating and accessing local/national Quit Smoking programs, and support quit date choice.    Expected Outcomes Short Term: Will demonstrate readiness to quit, by selecting a quit date.;Short Term: Will quit all tobacco product use, adhering to prevention of relapse plan.;Long Term: Complete abstinence from all tobacco products for at least 12 months from quit date.    Heart Failure Yes    Intervention Provide a combined exercise and nutrition program that is supplemented with education, support and counseling about heart failure. Directed toward relieving symptoms such as shortness of breath, decreased exercise tolerance, and extremity edema.    Expected Outcomes Improve functional capacity of life;Short term: Attendance in program 2-3 days a week with increased exercise capacity. Reported lower sodium intake. Reported increased fruit and vegetable intake. Reports medication compliance.;Short term: Daily weights obtained and reported for increase. Utilizing diuretic protocols set by physician.;Long term: Adoption of self-care skills and reduction of barriers for early signs and symptoms recognition and intervention leading to self-care maintenance.    Hypertension Yes     Intervention Provide education on lifestyle modifcations including regular physical activity/exercise, weight management, moderate sodium restriction and increased consumption of fresh fruit, vegetables, and low fat dairy, alcohol moderation, and smoking cessation.;Monitor prescription use compliance.    Expected Outcomes Short Term: Continued assessment and intervention until BP is < 140/27mm HG in hypertensive participants. < 130/36mm HG in hypertensive participants with diabetes, heart failure or chronic kidney disease.;Long Term: Maintenance of blood pressure at goal levels.    Lipids Yes    Intervention Provide education and support for participant on nutrition & aerobic/resistive exercise along with prescribed medications to achieve LDL 70mg , HDL >40mg .    Expected Outcomes Short Term: Participant states understanding of desired cholesterol values and is compliant with medications prescribed. Participant is following exercise prescription and nutrition guidelines.;Long Term: Cholesterol controlled with medications as prescribed, with individualized exercise RX and with personalized nutrition plan. Value goals: LDL < 70mg , HDL > 40 mg.          Tobacco Use Initial Evaluation: Social History  Tobacco Use  Smoking Status Some Days   Current packs/day: 0.00   Average packs/day: 0.3 packs/day for 20.0 years (5.0 ttl pk-yrs)   Types: Cigarettes   Start date: 09/02/2002   Last attempt to quit: 09/02/2022   Years since quitting: 1.6  Smokeless Tobacco Never  Tobacco Comments   I pack  20 cigarettes a week  wants to  QUIT.      Exercise Goals and Review:  Exercise Goals     Row Name 04/23/24 1608             Exercise Goals   Increase Physical Activity Yes       Intervention Provide advice, education, support and counseling about physical activity/exercise needs.;Develop an individualized exercise prescription for aerobic and resistive training based on initial evaluation findings,  risk stratification, comorbidities and participant's personal goals.       Expected Outcomes Short Term: Attend rehab on a regular basis to increase amount of physical activity.;Long Term: Add in home exercise to make exercise part of routine and to increase amount of physical activity.;Long Term: Exercising regularly at least 3-5 days a week.       Increase Strength and Stamina Yes       Intervention Provide advice, education, support and counseling about physical activity/exercise needs.;Develop an individualized exercise prescription for aerobic and resistive training based on initial evaluation findings, risk stratification, comorbidities and participant's personal goals.       Expected Outcomes Short Term: Increase workloads from initial exercise prescription for resistance, speed, and METs.;Short Term: Perform resistance training exercises routinely during rehab and add in resistance training at home;Long Term: Improve cardiorespiratory fitness, muscular endurance and strength as measured by increased METs and functional capacity ( )       Able to understand and use rate of perceived exertion (RPE) scale Yes       Intervention Provide education and explanation on how to use RPE scale       Expected Outcomes Short Term: Able to use RPE daily in rehab to express subjective intensity level;Long Term:  Able to use RPE to guide intensity level when exercising independently       Able to understand and use Dyspnea scale Yes       Intervention Provide education and explanation on how to use Dyspnea scale       Expected Outcomes Short Term: Able to use Dyspnea scale daily in rehab to express subjective sense of shortness of breath during exertion;Long Term: Able to use Dyspnea scale to guide intensity level when exercising independently       Knowledge and understanding of Target Heart Rate Range (THRR) Yes       Intervention Provide education and explanation of THRR including how the numbers were  predicted and where they are located for reference       Expected Outcomes Short Term: Able to state/look up THRR;Long Term: Able to use THRR to govern intensity when exercising independently;Short Term: Able to use daily as guideline for intensity in rehab       Able to check pulse independently Yes       Intervention Provide education and demonstration on how to check pulse in carotid and radial arteries.;Review the importance of being able to check your own pulse for safety during independent exercise       Expected Outcomes Short Term: Able to explain why pulse checking is important during independent exercise;Long Term: Able to check pulse independently and accurately  Understanding of Exercise Prescription Yes       Intervention Provide education, explanation, and written materials on patient's individual exercise prescription       Expected Outcomes Short Term: Able to explain program exercise prescription;Long Term: Able to explain home exercise prescription to exercise independently          Copy of goals given to participant.

## 2024-04-23 NOTE — Progress Notes (Signed)
 Assessment start time: 3:08 PM  Digestive issues/concerns: no known food allergies   24-hours Recall: B: when at work: sausage biscuit or nothing if not working  L: Mining engineer sandwich or chicken D: fast food if eating alone OR chicken pasta  Beverages regular soda  Education r/t nutrition plan Patient drinking water at home, but often drinking soda at work. He works at [employer] and has access to cheap foods but knows they are not the best health wise. He wants to finds some good alternatives, especially around breakfast time. Spoke with his about sodium and saturated fat guidelines of less than 1500mg  and 12g per day respectively. Provided mediterranean diet handout. Educated on types of fats, sources, and how to read facts labels. Reviewed a few facts labels of foods he eats and discussed how to evaluate if a food would be a good, decent or poor choice. Brainstormed several meals and snacks with foods he likes and will eat focusing on reducing sodium and saturated fat. Recommended oatmeal with fairlife milk or greek yogurt with fruit as good breakfast ideas to try.     Goal 1: Include more colorful produce, aim for 5-8 servings of fruits and veggies per day Goal 2: Reduce saturated fat, less than 12g per day. Replace bad fats for more heart healthy fats.  Goal 3: Eat 15-30gProtein and 30-60gCarbs at each meal.  End time 4:10 PM

## 2024-04-24 NOTE — Telephone Encounter (Addendum)
 Pt aware, agreeable, and verbalized understanding  Referral placed   ----- Message from Toribio Fuel sent at 04/22/2024  9:17 PM EDT ----- Mild OSA. Please refer to Dr. [name] to discuss options.  ----- Message ----- From: Vina Woodie SAILOR Sent: 04/19/2024  11:33 AM EDT To: Toribio JONELLE Fuel, MD

## 2024-04-25 ENCOUNTER — Ambulatory Visit

## 2024-04-26 ENCOUNTER — Encounter: Payer: Self-pay | Admitting: *Deleted

## 2024-04-26 ENCOUNTER — Ambulatory Visit

## 2024-04-26 DIAGNOSIS — I5022 Chronic systolic (congestive) heart failure: Secondary | ICD-10-CM

## 2024-04-26 NOTE — Telephone Encounter (Signed)
 The patient has been notified of the result via his voicemail and lmtcb. Joshua Dalton Seip, CMA 04/26/2024 4:38 PM

## 2024-04-26 NOTE — Progress Notes (Signed)
 Early Discharge Summary  Shane [name] DOB: 04/26/2071  [name] is discharging early due to his work schedule. He completed 1 of 36 sessions.    6 Minute Walk     Row Name 04/23/24 1603         6 Minute Walk   Phase Initial     Distance 1700 feet     Walk Time 6 minutes     # of Rest Breaks 0     MPH 3.22     METS 4.83     RPE 7     Perceived Dyspnea  0     VO2 Peak 16.9     Symptoms No     Resting HR 73 bpm     Resting BP 138/70     Resting Oxygen Saturation  96 %     Exercise Oxygen Saturation  during 6 min walk 97 %     Max Ex. HR 100 bpm     Max Ex. BP 138/70     2 Minute Post BP 126/80

## 2024-04-26 NOTE — Progress Notes (Signed)
 Cardiac Individual Treatment Plan  Patient Details  Name: Shane [name] MRN: [id number] Date of Birth: [birth year] Referring Provider:   Flowsheet Row Cardiac Rehab from 04/23/2024 in [facility] Cardiac and Pulmonary Rehab  Referring Provider Dr. Toribio Fuel    Initial Encounter Date:  Flowsheet Row Cardiac Rehab from 04/23/2024 in [facility] Cardiac and Pulmonary Rehab  Date 04/23/24    Visit Diagnosis: Heart failure, chronic systolic (HCC)  Patient's Home Medications on Admission:  Current Outpatient Medications:    aspirin  81 MG chewable tablet, Chew 1 tablet (81 mg total) by mouth daily., Disp: 30 tablet, Rfl: 1   buPROPion  (WELLBUTRIN  XL) 150 MG 24 hr tablet, Take 1 tablet (150 mg total) by mouth every morning for 3 days, THEN 2 tablets (300 mg total) every morning. (Patient taking differently:  2 tablets (300 mg total) every morning.), Disp: 90 tablet, Rfl: 1   busPIRone  (BUSPAR ) 5 MG tablet, Take 1 tablet (5 mg total) by mouth 3 (three) times daily., Disp: 90 tablet, Rfl: 2   cetirizine (ZYRTEC) 10 MG tablet, Take 10 mg by mouth daily. (Patient taking differently: Take 10 mg by mouth daily. Taking as needed), Disp: , Rfl:    cholecalciferol (VITAMIN D3) 25 MCG (1000 UNIT) tablet, Take 1,000 Units by mouth daily., Disp: , Rfl:    cyanocobalamin  (VITAMIN B12) 1000 MCG tablet, Take 1,000 mcg by mouth daily., Disp: , Rfl:    dapagliflozin  propanediol (FARXIGA ) 10 MG TABS tablet, Take 1 tablet (10 mg total) by mouth daily., Disp: 30 tablet, Rfl: 2   folic acid  (FOLVITE ) 1 MG tablet, Take 1 mg by mouth daily., Disp: , Rfl:    metoprolol  succinate (TOPROL -XL) 100 MG 24 hr tablet, Take 1 tablet (100 mg total) by mouth daily. Take 100 mg by mouth daily. Take with or immediately following a meal., Disp: 90 tablet, Rfl: 3   mexiletine (MEXITIL ) 200 MG capsule, Take 1 capsule (200 mg total) by mouth 2 (two) times daily., Disp: 180 capsule, Rfl: 3   mirtazapine  (REMERON ) 15 MG tablet, Take 1 tablet  (15 mg total) by mouth at bedtime. (Patient taking differently: Take 15 mg by mouth at bedtime. Using as needed), Disp: 90 tablet, Rfl: 3   rosuvastatin  (CRESTOR ) 40 MG tablet, Take 1 tablet (40 mg total) by mouth daily., Disp: 30 tablet, Rfl: 6   sacubitril -valsartan  (ENTRESTO ) 49-51 MG, Take 1 tablet by mouth 2 (two) times daily., Disp: 60 tablet, Rfl: 11   sertraline  (ZOLOFT ) 100 MG tablet, Take 1 tablet (100 mg total) by mouth daily., Disp: 90 tablet, Rfl: 3   sertraline  (ZOLOFT ) 25 MG tablet, Take 1 tablet (25 mg total) by mouth daily., Disp: 30 tablet, Rfl: 3   ticagrelor  (BRILINTA ) 60 MG TABS tablet, Take 1 tablet (60 mg total) by mouth 2 (two) times daily., Disp: 60 tablet, Rfl: 11   triamcinolone  ointment (KENALOG ) 0.5 %, Apply 1 Application topically 2 (two) times daily. Use sparingly for < 1 week on hands with vaseline (Patient taking differently: Apply 1 Application topically 2 (two) times daily. Use sparingly for < 1 week on hands with vaseline  using as needed), Disp: 15 g, Rfl: 2  Past Medical History: Past Medical History:  Diagnosis Date   Acute ST elevation myocardial infarction (STEMI) of inferolateral wall (HCC) 09/02/2022   Extensive thrombus in the RCA-thrombectomy with overlapping 3.5 mm stents (38 and 30 mm)   ADD (attention deficit disorder)    Alcohol abuse    History of.  Anxiety    Chronic HFrEF (heart failure with reduced ejection fraction) (HCC)    a. 09/2022 Echo: EF 35-40%, glob HK, GrI DD, mildly reduced RV fxn, RVSP 31.63mmHg, no significant valvular disease.   Coronary artery disease involving native coronary artery of native heart 08/2022   a. 08/2022 Inflat STEMI/PCI: LM nl, LAD nl, D1/2 nl, RI nl, LCX nl, OM1/2 nl, RCA 100p/m (thrombectomy & 3.5x38 & 3.5x30 overlapping Onyx Frontier DESs), 45m/d (thombectomy and PTCA into RPAV branch), RPDA 100, RPL1 100, EF 45-50%.   Depression    Frequent PVCs 02/17/2023   Hyperlipidemia    Hypertension    Ischemic  cardiomyopathy    a. 09/2022 Echo: EF 35-40%.   Ischemic cardiomyopathy 02/17/2023   09/2022 (inferior STEMI) echo: EF 35-40%, glob HK, GrI DD, mildly reduced RV fxn, RVSP 31.12mmHg, no significant valvular disease.  TTE follow-up 01/06/2023: EF remains 40 to 45% with mild to moderate dysfunction       Tobacco Use: Social History   Tobacco Use  Smoking Status Some Days   Current packs/day: 0.00   Average packs/day: 0.3 packs/day for 20.0 years (5.0 ttl pk-yrs)   Types: Cigarettes   Start date: 09/02/2002   Last attempt to quit: 09/02/2022   Years since quitting: 1.6  Smokeless Tobacco Never  Tobacco Comments   I pack  20 cigarettes a week  wants to  QUIT.      Labs: Review Flowsheet  More data exists      Latest Ref Rng & Units 07/05/2022 09/03/2022 10/25/2022 02/17/2023 12/21/2023  Labs for ITP Cardiac and Pulmonary Rehab  Cholestrol 100 - 199 mg/dL - 823  827  864  813   LDL (calc) 0 - 99 mg/dL - 886  898  65  95   HDL-C >39 mg/dL - 37  44.39  57  52   Trlycerides 0 - 149 mg/dL - 867  22.9  65  768   Hemoglobin A1c 4.0 - 5.6 % 5.8  - - - -     Exercise Target Goals: Exercise Program Goal: Individual exercise prescription set using results from initial 6 min walk test and THRR while considering  patient's activity barriers and safety.   Exercise Prescription Goal: Initial exercise prescription builds to 30-45 minutes a day of aerobic activity, 2-3 days per week.  Home exercise guidelines will be given to patient during program as part of exercise prescription that the participant will acknowledge.   Education: Aerobic Exercise: - Group verbal and visual presentation on the components of exercise prescription. Introduces F.I.T.T principle from ACSM for exercise prescriptions.  Reviews F.I.T.T. principles of aerobic exercise including progression. Written material given at graduation. Flowsheet Row Cardiac Rehab from 04/23/2024 in [facility] Cardiac and Pulmonary Rehab  Education need  identified 04/23/24    Education: Resistance Exercise: - Group verbal and visual presentation on the components of exercise prescription. Introduces F.I.T.T principle from ACSM for exercise prescriptions  Reviews F.I.T.T. principles of resistance exercise including progression. Written material given at graduation.    Education: Exercise & Equipment Safety: - Individual verbal instruction and demonstration of equipment use and safety with use of the equipment. Flowsheet Row Cardiac Rehab from 04/23/2024 in [facility] Cardiac and Pulmonary Rehab  Date 04/23/24  Educator [facility]  Instruction Review Code 1- Verbalizes Understanding    Education: Exercise Physiology & General Exercise Guidelines: - Group verbal and written instruction with models to review the exercise physiology of the cardiovascular system and associated critical values. Provides general  exercise guidelines with specific guidelines to those with heart or lung disease.  Flowsheet Row Cardiac Rehab from 04/23/2024 in [facility] Cardiac and Pulmonary Rehab  Education need identified 04/23/24    Education: Flexibility, Balance, Mind/Body Relaxation: - Group verbal and visual presentation with interactive activity on the components of exercise prescription. Introduces F.I.T.T principle from ACSM for exercise prescriptions. Reviews F.I.T.T. principles of flexibility and balance exercise training including progression. Also discusses the mind body connection.  Reviews various relaxation techniques to help reduce and manage stress (i.e. Deep breathing, progressive muscle relaxation, and visualization). Balance handout provided to take home. Written material given at graduation.   Activity Barriers & Risk Stratification:  Activity Barriers & Cardiac Risk Stratification - 04/23/24 1604       Activity Barriers & Cardiac Risk Stratification   Activity Barriers Other (comment)    Cardiac Risk Stratification High          6 Minute Walk:  6 Minute  Walk     Row Name 04/23/24 1603         6 Minute Walk   Phase Initial     Distance 1700 feet     Walk Time 6 minutes     # of Rest Breaks 0     MPH 3.22     METS 4.83     RPE 7     Perceived Dyspnea  0     VO2 Peak 16.9     Symptoms No     Resting HR 73 bpm     Resting BP 138/70     Resting Oxygen Saturation  96 %     Exercise Oxygen Saturation  during 6 min walk 97 %     Max Ex. HR 100 bpm     Max Ex. BP 138/70     2 Minute Post BP 126/80        Oxygen Initial Assessment:   Oxygen Re-Evaluation:   Oxygen Discharge (Final Oxygen Re-Evaluation):   Initial Exercise Prescription:  Initial Exercise Prescription - 04/23/24 1600       Date of Initial Exercise RX and Referring Provider   Date 04/23/24    Referring Provider Dr. Daniel Bensimhon      Oxygen   Maintain Oxygen Saturation 88% or higher      Treadmill   MPH 4    Grade 1    Minutes 15    METs 4.61      Elliptical   Level 1    Minutes 15    METs 4.83      REL-XR   Level 3    Speed 50    Minutes 15    METs 4.83      T5 Nustep   Level 3    SPM 80    Minutes 15    METs 4.83      Rower   Level 4    Minutes 15    METs 4.83      Prescription Details   Frequency (times per week) 3    Duration Progress to 30 minutes of continuous aerobic without signs/symptoms of physical distress      Intensity   THRR 40-80% of Max Heartrate 111-149    Ratings of Perceived Exertion 11-13    Perceived Dyspnea 0-4      Progression   Progression Continue to progress workloads to maintain intensity without signs/symptoms of physical distress.      Resistance Training   Training Prescription Yes  Weight 7    Reps 10-15          Perform Capillary Blood Glucose checks as needed.  Exercise Prescription Changes:   Exercise Prescription Changes     Row Name 04/23/24 1600             Response to Exercise   Blood Pressure (Admit) 120/70       Blood Pressure (Exercise) 138/70       Blood  Pressure (Exit) 126/80       Heart Rate (Admit) 73 bpm       Heart Rate (Exercise) 100 bpm       Heart Rate (Exit) 71 bpm       Oxygen Saturation (Admit) 96 %       Oxygen Saturation (Exercise) 97 %       Oxygen Saturation (Exit) 97 %       Rating of Perceived Exertion (Exercise) 7       Perceived Dyspnea (Exercise) 0       Symptoms none       Comments 6 MWT results          Exercise Comments:   Exercise Goals and Review:   Exercise Goals     Row Name 04/23/24 1608             Exercise Goals   Increase Physical Activity Yes       Intervention Provide advice, education, support and counseling about physical activity/exercise needs.;Develop an individualized exercise prescription for aerobic and resistive training based on initial evaluation findings, risk stratification, comorbidities and participant's personal goals.       Expected Outcomes Short Term: Attend rehab on a regular basis to increase amount of physical activity.;Long Term: Add in home exercise to make exercise part of routine and to increase amount of physical activity.;Long Term: Exercising regularly at least 3-5 days a week.       Increase Strength and Stamina Yes       Intervention Provide advice, education, support and counseling about physical activity/exercise needs.;Develop an individualized exercise prescription for aerobic and resistive training based on initial evaluation findings, risk stratification, comorbidities and participant's personal goals.       Expected Outcomes Short Term: Increase workloads from initial exercise prescription for resistance, speed, and METs.;Short Term: Perform resistance training exercises routinely during rehab and add in resistance training at home;Long Term: Improve cardiorespiratory fitness, muscular endurance and strength as measured by increased METs and functional capacity ( )       Able to understand and use rate of perceived exertion (RPE) scale Yes       Intervention  Provide education and explanation on how to use RPE scale       Expected Outcomes Short Term: Able to use RPE daily in rehab to express subjective intensity level;Long Term:  Able to use RPE to guide intensity level when exercising independently       Able to understand and use Dyspnea scale Yes       Intervention Provide education and explanation on how to use Dyspnea scale       Expected Outcomes Short Term: Able to use Dyspnea scale daily in rehab to express subjective sense of shortness of breath during exertion;Long Term: Able to use Dyspnea scale to guide intensity level when exercising independently       Knowledge and understanding of Target Heart Rate Range (THRR) Yes       Intervention Provide education and explanation of THRR including how  the numbers were predicted and where they are located for reference       Expected Outcomes Short Term: Able to state/look up THRR;Long Term: Able to use THRR to govern intensity when exercising independently;Short Term: Able to use daily as guideline for intensity in rehab       Able to check pulse independently Yes       Intervention Provide education and demonstration on how to check pulse in carotid and radial arteries.;Review the importance of being able to check your own pulse for safety during independent exercise       Expected Outcomes Short Term: Able to explain why pulse checking is important during independent exercise;Long Term: Able to check pulse independently and accurately       Understanding of Exercise Prescription Yes       Intervention Provide education, explanation, and written materials on patient's individual exercise prescription       Expected Outcomes Short Term: Able to explain program exercise prescription;Long Term: Able to explain home exercise prescription to exercise independently          Exercise Goals Re-Evaluation :   Discharge Exercise Prescription (Final Exercise Prescription Changes):  Exercise Prescription  Changes - 04/23/24 1600       Response to Exercise   Blood Pressure (Admit) 120/70    Blood Pressure (Exercise) 138/70    Blood Pressure (Exit) 126/80    Heart Rate (Admit) 73 bpm    Heart Rate (Exercise) 100 bpm    Heart Rate (Exit) 71 bpm    Oxygen Saturation (Admit) 96 %    Oxygen Saturation (Exercise) 97 %    Oxygen Saturation (Exit) 97 %    Rating of Perceived Exertion (Exercise) 7    Perceived Dyspnea (Exercise) 0    Symptoms none    Comments 6 MWT results          Nutrition:  Target Goals: Understanding of nutrition guidelines, daily intake of sodium 1500mg , cholesterol 200mg , calories 30% from fat and 7% or less from saturated fats, daily to have 5 or more servings of fruits and vegetables.  Education: All About Nutrition: -Group instruction provided by verbal, written material, interactive activities, discussions, models, and posters to present general guidelines for heart healthy nutrition including fat, fiber, MyPlate, the role of sodium in heart healthy nutrition, utilization of the nutrition label, and utilization of this knowledge for meal planning. Follow up email sent as well. Written material given at graduation. Flowsheet Row Cardiac Rehab from 04/23/2024 in [facility] Cardiac and Pulmonary Rehab  Education need identified 04/23/24    Biometrics:  Pre Biometrics - 04/23/24 1609       Pre Biometrics   Height 6' 2 (1.88 m)    Weight 209 lb 11.2 oz (95.1 kg)    Waist Circumference 38.5 inches    Hip Circumference 40 inches    Waist to Hip Ratio 0.96 %    BMI (Calculated) 26.91    Single Leg Stand 22.03 seconds           Nutrition Therapy Plan and Nutrition Goals:  Nutrition Therapy & Goals - 04/23/24 1619       Nutrition Therapy   Diet Cardiac, Low na    Protein (specify units) 70-90    Fiber 30 grams    Whole Grain Foods 3 servings    Saturated Fats 15 max. grams    Fruits and Vegetables 5 servings/day    Sodium 2 grams      Personal Nutrition  Goals   Nutrition Goal Include more colorful produce, aim for 5-8 servings of fruits and veggies per day    Personal Goal #2 Reduce saturated fat, less than 12g per day. Replace bad fats for more heart healthy fats.    Personal Goal #3 Eat 15-30gProtein and 30-60gCarbs at each meal.    Comments Patient drinking water at home, but often drinking soda at work. He works at [employer] and has access to cheap foods but knows they are not the best health wise. He wants to finds some good alternatives, especially around breakfast time. Spoke with his about sodium and saturated fat guidelines of less than 1500mg  and 12g per day respectively. Provided mediterranean diet handout. Educated on types of fats, sources, and how to read facts labels. Reviewed a few facts labels of foods he eats and discussed how to evaluate if a food would be a good, decent or poor choice. Brainstormed several meals and snacks with foods he likes and will eat focusing on reducing sodium and saturated fat. Recommended oatmeal with fairlife milk or greek yogurt with fruit as good breakfast ideas to try.      Intervention Plan   Intervention Prescribe, educate and counsel regarding individualized specific dietary modifications aiming towards targeted core components such as weight, hypertension, lipid management, diabetes, heart failure and other comorbidities.;Nutrition handout(s) given to patient.    Expected Outcomes Long Term Goal: Adherence to prescribed nutrition plan.;Short Term Goal: A plan has been developed with personal nutrition goals set during dietitian appointment.;Short Term Goal: Understand basic principles of dietary content, such as calories, fat, sodium, cholesterol and nutrients.          Nutrition Assessments:  MEDIFICTS Score Key: >=70 Need to make dietary changes  40-70 Heart Healthy Diet <= 40 Therapeutic Level Cholesterol Diet  Flowsheet Row Cardiac Rehab from 04/23/2024 in [facility] Cardiac and Pulmonary Rehab   Picture Your Plate Total Score on Admission 73   Picture Your Plate Scores: <59 Unhealthy dietary pattern with much room for improvement. 41-50 Dietary pattern unlikely to meet recommendations for good health and room for improvement. 51-60 More healthful dietary pattern, with some room for improvement.  >60 Healthy dietary pattern, although there may be some specific behaviors that could be improved.    Nutrition Goals Re-Evaluation:   Nutrition Goals Discharge (Final Nutrition Goals Re-Evaluation):   Psychosocial: Target Goals: Acknowledge presence or absence of significant depression and/or stress, maximize coping skills, provide positive support system. Participant is able to verbalize types and ability to use techniques and skills needed for reducing stress and depression.   Education: Stress, Anxiety, and Depression - Group verbal and visual presentation to define topics covered.  Reviews how body is impacted by stress, anxiety, and depression.  Also discusses healthy ways to reduce stress and to treat/manage anxiety and depression.  Written material given at graduation.   Education: Sleep Hygiene -Provides group verbal and written instruction about how sleep can affect your health.  Define sleep hygiene, discuss sleep cycles and impact of sleep habits. Review good sleep hygiene tips.    Initial Review & Psychosocial Screening:  Initial Psych Review & Screening - 04/12/24 1448       Initial Review   Comments recent divorce  2021  does not want to talk about this topic          Quality of Life Scores:   Quality of Life - 04/23/24 1611       Quality of Life   Select Quality  of Life      Quality of Life Scores   Health/Function Pre 22.4 %    Socioeconomic Pre 29.14 %    Psych/Spiritual Pre 24 %    Family Pre 28.5 %    GLOBAL Pre 24.91 %         Scores of 19 and below usually indicate a poorer quality of life in these areas.  A difference of  2-3 points is a  clinically meaningful difference.  A difference of 2-3 points in the total score of the Quality of Life Index has been associated with significant improvement in overall quality of life, self-image, physical symptoms, and general health in studies assessing change in quality of life.  PHQ-9: Review Flowsheet  More data exists      04/23/2024 01/11/2024 12/21/2023 10/19/2023 06/27/2023  Depression screen PHQ 2/9  Decreased Interest 0 0 0 0 0  Down, Depressed, Hopeless 0 0 0 0 0  PHQ - 2 Score 0 0 0 0 0  Altered sleeping 1 0 0 - -  Tired, decreased energy 0 0 0 - -  Change in appetite 0 0 0 - -  Feeling bad or failure about yourself  0 0 0 - -  Trouble concentrating 0 0 0 - -  Moving slowly or fidgety/restless 0 0 0 - -  Suicidal thoughts 0 0 0 - -  PHQ-9 Score 1 0 0 - -  Difficult doing work/chores Not difficult at all Not difficult at all Not difficult at all - -   Interpretation of Total Score  Total Score Depression Severity:  1-4 = Minimal depression, 5-9 = Mild depression, 10-14 = Moderate depression, 15-19 = Moderately severe depression, 20-27 = Severe depression   Psychosocial Evaluation and Intervention:  Psychosocial Evaluation - 04/12/24 1450       Psychosocial Evaluation & Interventions   Comments Shane [name] has no barriers to attending the program. CUrrently he is living with his parens while his air conditioning is fixed at his home.  He normally lives alone.  His parents and 2 adult daughters are his support.   He has ADHD and OCD and his Adderal was stopped.   He is taking other meds in place of the adderal. The meds will help also to contorl his anxiety symptoms. He is trying to quit smoking cigarettes.  He is on meds that should help with his cessation;he is down to a pack a week and is not smoking the entire cigarette.   He is ready to start the program    Expected Outcomes STG attend all scheduled session,  continue to work on tobacco cessation, work on exericse progression ,  take meds for anxiety as prescribed  LTG   has quit tobacco completely, continues to take meds for anxiety symptom control.    Continue Psychosocial Services  Follow up required by staff          Psychosocial Re-Evaluation:   Psychosocial Discharge (Final Psychosocial Re-Evaluation):   Vocational Rehabilitation: Provide vocational rehab assistance to qualifying candidates.   Vocational Rehab Evaluation & Intervention:   Education: Education Goals: Education classes will be provided on a variety of topics geared toward better understanding of heart health and risk factor modification. Participant will state understanding/return demonstration of topics presented as noted by education test scores.  Learning Barriers/Preferences:  Learning Barriers/Preferences - 04/12/24 1354       Learning Barriers/Preferences   Learning Barriers None    Learning Preferences None  General Cardiac Education Topics:  AED/CPR: - Group verbal and written instruction with the use of models to demonstrate the basic use of the AED with the basic ABC's of resuscitation.   Anatomy and Cardiac Procedures: - Group verbal and visual presentation and models provide information about basic cardiac anatomy and function. Reviews the testing methods done to diagnose heart disease and the outcomes of the test results. Describes the treatment choices: Medical Management, Angioplasty, or Coronary Bypass Surgery for treating various heart conditions including Myocardial Infarction, Angina, Valve Disease, and Cardiac Arrhythmias.  Written material given at graduation.   Medication Safety: - Group verbal and visual instruction to review commonly prescribed medications for heart and lung disease. Reviews the medication, class of the drug, and side effects. Includes the steps to properly store meds and maintain the prescription regimen.  Written material given at graduation.   Intimacy: - Group verbal  instruction through game format to discuss how heart and lung disease can affect sexual intimacy. Written material given at graduation..   Know Your Numbers and Heart Failure: - Group verbal and visual instruction to discuss disease risk factors for cardiac and pulmonary disease and treatment options.  Reviews associated critical values for Overweight/Obesity, Hypertension, Cholesterol, and Diabetes.  Discusses basics of heart failure: signs/symptoms and treatments.  Introduces Heart Failure Zone chart for action plan for heart failure.  Written material given at graduation.   Infection Prevention: - Provides verbal and written material to individual with discussion of infection control including proper hand washing and proper equipment cleaning during exercise session. Flowsheet Row Cardiac Rehab from 04/23/2024 in [facility] Cardiac and Pulmonary Rehab  Date 04/23/24  Educator [facility]  Instruction Review Code 1- Verbalizes Understanding    Falls Prevention: - Provides verbal and written material to individual with discussion of falls prevention and safety. Flowsheet Row Cardiac Rehab from 04/23/2024 in [facility] Cardiac and Pulmonary Rehab  Date 04/23/24  Educator [facility]  Instruction Review Code 1- Verbalizes Understanding    Other: -Provides group and verbal instruction on various topics (see comments)   Knowledge Questionnaire Score:  Knowledge Questionnaire Score - 04/23/24 1611       Knowledge Questionnaire Score   Pre Score 23/26          Core Components/Risk Factors/Patient Goals at Admission:  Personal Goals and Risk Factors at Admission - 04/23/24 1602       Core Components/Risk Factors/Patient Goals on Admission    Weight Management Yes    Intervention Weight Management: Develop a combined nutrition and exercise program designed to reach desired caloric intake, while maintaining appropriate intake of nutrient and fiber, sodium and fats, and appropriate energy expenditure required for  the weight goal.;Weight Management: Provide education and appropriate resources to help participant work on and attain dietary goals.    Admit Weight 209 lb 11.2 oz (95.1 kg)    Goal Weight: Short Term 205 lb (93 kg)    Goal Weight: Long Term 195 lb (88.5 kg)    Expected Outcomes Short Term: Continue to assess and modify interventions until short term weight is achieved;Long Term: Adherence to nutrition and physical activity/exercise program aimed toward attainment of established weight goal;Weight Loss: Understanding of general recommendations for a balanced deficit meal plan, which promotes 1-2 lb weight loss per week and includes a negative energy balance of [phone] kcal/d;Understanding of distribution of calorie intake throughout the day with the consumption of 4-5 meals/snacks;Understanding recommendations for meals to include 15-35% energy as protein, 25-35% energy from fat, 35-60%  energy from carbohydrates, less than 200mg  of dietary cholesterol, 20-35 gm of total fiber daily    Tobacco Cessation Yes    Number of packs per day Shane [name] is a current tobacco user. Intervention for tobacco cessation was provided at the initial medical review. He was asked about readiness to quit and reported that he is slowly weaning down to no cigarettes.  He is taking wellbutrin  and he is hoping he will lose the desire/taste for the cigarettes.  . Patient was advised and educated about tobacco cessation using combination therapy, tobacco cessation classes, quit line, and quit smoking apps. Patient demonstrated understanding of this material. Staff will continue to provide encouragement and follow up with the patient throughout the program.    Intervention Assist the participant in steps to quit. Provide individualized education and counseling about committing to Tobacco Cessation, relapse prevention, and pharmacological support that can be provided by physician.;Education officer, environmental, assist with locating and  accessing local/national Quit Smoking programs, and support quit date choice.    Expected Outcomes Short Term: Will demonstrate readiness to quit, by selecting a quit date.;Short Term: Will quit all tobacco product use, adhering to prevention of relapse plan.;Long Term: Complete abstinence from all tobacco products for at least 12 months from quit date.    Heart Failure Yes    Intervention Provide a combined exercise and nutrition program that is supplemented with education, support and counseling about heart failure. Directed toward relieving symptoms such as shortness of breath, decreased exercise tolerance, and extremity edema.    Expected Outcomes Improve functional capacity of life;Short term: Attendance in program 2-3 days a week with increased exercise capacity. Reported lower sodium intake. Reported increased fruit and vegetable intake. Reports medication compliance.;Short term: Daily weights obtained and reported for increase. Utilizing diuretic protocols set by physician.;Long term: Adoption of self-care skills and reduction of barriers for early signs and symptoms recognition and intervention leading to self-care maintenance.    Hypertension Yes    Intervention Provide education on lifestyle modifcations including regular physical activity/exercise, weight management, moderate sodium restriction and increased consumption of fresh fruit, vegetables, and low fat dairy, alcohol moderation, and smoking cessation.;Monitor prescription use compliance.    Expected Outcomes Short Term: Continued assessment and intervention until BP is < 140/46mm HG in hypertensive participants. < 130/65mm HG in hypertensive participants with diabetes, heart failure or chronic kidney disease.;Long Term: Maintenance of blood pressure at goal levels.    Lipids Yes    Intervention Provide education and support for participant on nutrition & aerobic/resistive exercise along with prescribed medications to achieve LDL 70mg ,  HDL >40mg .    Expected Outcomes Short Term: Participant states understanding of desired cholesterol values and is compliant with medications prescribed. Participant is following exercise prescription and nutrition guidelines.;Long Term: Cholesterol controlled with medications as prescribed, with individualized exercise RX and with personalized nutrition plan. Value goals: LDL < 70mg , HDL > 40 mg.          Education:Diabetes - Individual verbal and written instruction to review signs/symptoms of diabetes, desired ranges of glucose level fasting, after meals and with exercise. Acknowledge that pre and post exercise glucose checks will be done for 3 sessions at entry of program.   Core Components/Risk Factors/Patient Goals Review:    Core Components/Risk Factors/Patient Goals at Discharge (Final Review):    ITP Comments:  ITP Comments     Row Name 04/12/24 1444 04/23/24 1600 04/26/24 1639       ITP Comments Virtual orientation call completed today.  he has an appointment on Date: 04/23/2024  for EP eval and gym Orientation.  Documentation of diagnosis can be found in [facility]  03/29/2024.   Shane [name] is a current tobacco user. Intervention for tobacco cessation was provided at the initial medical review. He was asked about readiness to quit and reported that he is slowly weaning down to no cigarettes.  He is taking wellbutrin  and he is hoping he will lose the desire/taste for the cigarettes.  . Patient was advised and educated about tobacco cessation using combination therapy, tobacco cessation classes, quit line, and quit smoking apps. Patient demonstrated understanding of this material. Staff will continue to provide encouragement and follow up with the patient throughout the program. Completed and gym orientation for cardiac rehab. Initial ITP created and sent for review to Dr. Faud Aleskerov, Medical Director.Shane [name] is a current tobacco user. Intervention for tobacco cessation was provided at the  initial medical review. He was asked about readiness to quit and reported that he is slowly weaning down to no cigarettes.  He is taking wellbutrin  and he is hoping he will lose the desire/taste for the cigarettes.  . Patient was advised and educated about tobacco cessation using combination therapy, tobacco cessation classes, quit line, and quit smoking apps. Patient demonstrated understanding of this material. Staff will continue to provide encouragement and follow up with the patient throughout the program. Mr. Siefring stopped by the office to inform staff that he is unable to attend the program due to his work schedule. He was only able to attend 1 session. He is being discharged at this time.        Comments: Early Discharge ITP

## 2024-04-26 NOTE — Telephone Encounter (Signed)
 No contact letter sent

## 2024-04-27 ENCOUNTER — Encounter: Payer: Self-pay | Admitting: *Deleted

## 2024-04-30 ENCOUNTER — Other Ambulatory Visit: Payer: Self-pay | Admitting: Family

## 2024-04-30 ENCOUNTER — Ambulatory Visit

## 2024-04-30 ENCOUNTER — Other Ambulatory Visit: Payer: Self-pay

## 2024-04-30 ENCOUNTER — Other Ambulatory Visit: Payer: Self-pay | Admitting: Physician Assistant

## 2024-04-30 DIAGNOSIS — F325 Major depressive disorder, single episode, in full remission: Secondary | ICD-10-CM

## 2024-04-30 DIAGNOSIS — F3342 Major depressive disorder, recurrent, in full remission: Secondary | ICD-10-CM

## 2024-04-30 MED ORDER — SERTRALINE HCL 100 MG PO TABS
100.0000 mg | ORAL_TABLET | Freq: Every day | ORAL | 3 refills | Status: AC
  Filled 2024-04-30: qty 30, 30d supply, fill #0
  Filled 2024-06-15: qty 30, 30d supply, fill #1
  Filled 2024-09-14: qty 30, 30d supply, fill #2

## 2024-04-30 MED ORDER — BUPROPION HCL ER (XL) 150 MG PO TB24
ORAL_TABLET | ORAL | 1 refills | Status: DC
  Filled 2024-04-30: qty 65, 34d supply, fill #0
  Filled 2024-06-22: qty 65, 32d supply, fill #1

## 2024-04-30 MED FILL — Rosuvastatin Calcium Tab 40 MG: ORAL | 30 days supply | Qty: 30 | Fill #0 | Status: AC

## 2024-05-02 ENCOUNTER — Ambulatory Visit

## 2024-05-03 ENCOUNTER — Ambulatory Visit

## 2024-05-07 ENCOUNTER — Ambulatory Visit

## 2024-05-09 ENCOUNTER — Ambulatory Visit

## 2024-05-10 ENCOUNTER — Ambulatory Visit

## 2024-05-14 ENCOUNTER — Ambulatory Visit

## 2024-05-16 ENCOUNTER — Ambulatory Visit

## 2024-05-17 ENCOUNTER — Ambulatory Visit

## 2024-05-20 NOTE — Progress Notes (Unsigned)
 Electrophysiology Clinic Note    Date:  05/21/2024  Patient ID:  Shane [name], Shane [name] 1971-02-27, MRN [id number] PCP:  Dineen Rollene MATSU, FNP  Cardiologist:  Alm Clay, MD  Cardiology APP:  Abigail Bernardino HERO, PA-C  Electrophysiologist:  OLE ONEIDA HOLTS, MD  Electrophysiology APP:  Ardis Fullwood, NP   HF cardiologist: Bensimhon   Discussed the use of AI scribe software for clinical note transcription with the patient, who gave verbal consent to proceed.   Patient Profile    Chief Complaint: 90d ICD implant  History of Present Illness: Shane [name] is a 53 y.o. male with PMH notable for ICM s/p ICD, CAD s/p thrombectomy and PCI, HFrEF, PVCs, HTN, HLD, tobacco use, ; seen today for OLE ONEIDA HOLTS, MD for routine electrophysiology follow-up s/p Defibrillator implant.  He is s/p single chamber ICD implant 02/10/2024 by Dr. HOLTS  He last saw Dr. Cherrie 03/2024, where sleep study was recommended. Also started on enresto and farxiga . Sleep study revealed mild OSA, recommended OV with Dr. [name] to discuss which has not been scheduled yet.   On follow-up today, he is doing very well and has no complaints regarding his ICD. He no longer appreciates palpitations when laying in bed at night. He continues to take mexiletine BID along with toprol . Mexiletine is expensive, but not unaffordable.   He remains active at work in [employer]. He denies chest pain,c hest pressure, palpitations, or increased edema. Socks occasionally leave indention at shins.      Arrhythmia/Device History Bos Sci single chamber ICD, imp 02/2024; dx HFrEF   AAD -  Mexiletine     ROS:  Please see the history of present illness. All other systems are reviewed and otherwise negative.    Physical Exam    VS:  BP 130/84 (BP Location: Left Arm, Patient Position: Sitting, Cuff Size: Normal)   Pulse 67   Ht 6' 2 (1.88 m)   Wt 206 lb 12.8 oz (93.8 kg)   SpO2 99%   BMI 26.55 kg/m   BMI: Body mass index is 26.55 kg/m.      Wt Readings from Last 3 Encounters:  05/21/24 206 lb 12.8 oz (93.8 kg)  04/23/24 209 lb 11.2 oz (95.1 kg)  03/29/24 211 lb (95.7 kg)     GEN- The patient is well appearing, alert and oriented x 3 today.   Lungs- Clear to ausculation bilaterally, normal work of breathing.  Heart- Regular rate and rhythm, no murmurs, rubs or gallops Extremities- No peripheral edema, warm, dry Skin-  device pocket well-healed, no tethering   Device interrogation done today and reviewed by myself:  Battery 15 years Lead thresholds, impedence, sensing stable  No episodes Occasional PVCs during interrogation No changes made today   Studies Reviewed   Previous EP, cardiology notes.    EKG is ordered. Personal review of EKG from today shows:    EKG Interpretation Date/Time:  Monday May 21 2024 13:02:04 EDT Ventricular Rate:  67 PR Interval:  148 QRS Duration:  90 QT Interval:  412 QTC Calculation: 435 R Axis:   38  Text Interpretation: Normal sinus rhythm Confirmed by Kelty Szafran [phone]) on 05/21/2024 1:05:05 PM    05/21/2024 Rhythm strip - SR without PVC  Cardiac MRI, 12/21/2023 1. Moderate-severely reduced LV systolic function.  LVEF 30%.  2. There is LV global hypokinesis with inferior wall akinesis.  3. There is transmural LV LGE/scar in the basal-mid inferior wall (not viable).  4. LV LGE/scar comprises 31g (approximately 25%) of total LV myocardial mass.  5. Findings suggest ischemic cardiomyopathy with prior inferior wall infarct.  LHC, 09/15/2023   Previously placed Prox RCA to Mid RCA stent of unknown type is  widely patent.   1.  Widely patent RCA stents with no significant restenosis. Near normal left coronary arteries. 2.  Left ventricular angiography was not performed.  EF was mildly reduced by echo. 3.  Mildly elevated left ventricular end-diastolic pressure at 14 mmHg.  Long term monitor, 08/12/2023   ~7-day Zio patch  monitor (10/26-11/11/2022)   Patient was in sinus rhythm with a rate range of 54 to 140 bpm and average of 87 bpm.   Isolated PVCs (premature ventricular contractions) were frequent (21.4%, 181245), PVC couplets were occasional (2.2%, 9155), and PVC triplets were rare (<1.0%, 269). Ventricular Bigeminy and Trigeminy were present.   Isolated PACs (premature atrial contractions) were rare (<1.0%),   No Sustained Arrhythmias: Atrial Tachycardia (AT), Supraventricular Tachycardia (SVT), Atrial Fibrillation (A-Fib), Atrial Flutter (A-Flutter), Sustained Ventricular Tachycardia (VT)   No significant pauses, bradycardia or heart block.    Assessment and Plan     #) ICM/NICM s/p ICD Device functioning well, see paceart for details VP < 1% No arrhythmias noted  #) PVC PVCs much improved on today's EKG and rhythm strip Occasional PVCs during device interrogation Continue Mexiletine 200mg  BID and 100mg  toprol  daily We discussed shopping different pharmacy's for best cost Will update zio at next appt        Current medicines are reviewed at length with the patient today.   The patient does not have concerns regarding his medicines.  The following changes were made today:  none  Labs/ tests ordered today include:  Orders Placed This Encounter  Procedures   EKG 12-Lead     Disposition: Follow up with Dr. Cindie or EP APP in 6 months   Signed, Jojo Geving, NP  05/21/24  3:47 PM  Electrophysiology [facility]

## 2024-05-21 ENCOUNTER — Ambulatory Visit

## 2024-05-21 ENCOUNTER — Ambulatory Visit: Attending: Cardiology | Admitting: Cardiology

## 2024-05-21 VITALS — BP 130/84 | HR 67 | Ht 74.0 in | Wt 206.8 lb

## 2024-05-21 DIAGNOSIS — Z95810 Presence of automatic (implantable) cardiac defibrillator: Secondary | ICD-10-CM | POA: Diagnosis not present

## 2024-05-21 DIAGNOSIS — I255 Ischemic cardiomyopathy: Secondary | ICD-10-CM

## 2024-05-21 DIAGNOSIS — I493 Ventricular premature depolarization: Secondary | ICD-10-CM | POA: Diagnosis not present

## 2024-05-21 LAB — CUP PACEART INCLINIC DEVICE CHECK
Date Time Interrogation Session: 20250818155303
HighPow Impedance: 87 Ohm
Implantable Lead Connection Status: 753985
Implantable Lead Implant Date: 20250508
Implantable Lead Location: 753860
Implantable Lead Model: 673
Implantable Lead Serial Number: 263619
Implantable Pulse Generator Implant Date: 20250508
Lead Channel Impedance Value: 654 Ohm
Lead Channel Pacing Threshold Amplitude: 0.5 V
Lead Channel Pacing Threshold Pulse Width: 0.4 ms
Lead Channel Sensing Intrinsic Amplitude: 23 mV
Lead Channel Setting Pacing Amplitude: 3.5 V
Lead Channel Setting Pacing Pulse Width: 0.4 ms
Lead Channel Setting Sensing Sensitivity: 0.5 mV
Pulse Gen Serial Number: 352785
Zone Setting Status: 755011

## 2024-05-21 NOTE — Patient Instructions (Signed)
 Medication Instructions:  The current medical regimen is effective;  continue present plan and medications as directed. Please refer to the Current Medication list given to you today.   *If you need a refill on your cardiac medications before your next appointment, please call your pharmacy*  Follow-Up: At [facility], you and your health needs are our priority.  As part of our continuing mission to provide you with exceptional heart care, our providers are all part of one team.  This team includes your primary Cardiologist (physician) and Advanced Practice Providers or APPs (Physician Assistants and Nurse Practitioners) who all work together to provide you with the care you need, when you need it.  Your next appointment:   6 month(s)  Provider:   Steffanie Dunn, MD or Sherie Don, NP    We recommend signing up for the patient portal called "MyChart".  Sign up information is provided on this After Visit Summary.  MyChart is used to connect with patients for Virtual Visits (Telemedicine).  Patients are able to view lab/test results, encounter notes, upcoming appointments, etc.  Non-urgent messages can be sent to your provider as well.   To learn more about what you can do with MyChart, go to [email or web address].

## 2024-05-23 ENCOUNTER — Ambulatory Visit

## 2024-05-24 ENCOUNTER — Encounter: Payer: Self-pay | Admitting: Cardiology

## 2024-05-24 ENCOUNTER — Ambulatory Visit

## 2024-05-24 ENCOUNTER — Ambulatory Visit: Attending: Cardiology | Admitting: Cardiology

## 2024-05-24 ENCOUNTER — Ambulatory Visit: Payer: Self-pay | Admitting: Cardiology

## 2024-05-24 VITALS — BP 112/70 | HR 74 | Ht 74.0 in | Wt 208.4 lb

## 2024-05-24 DIAGNOSIS — I5042 Chronic combined systolic (congestive) and diastolic (congestive) heart failure: Secondary | ICD-10-CM | POA: Diagnosis not present

## 2024-05-24 DIAGNOSIS — I493 Ventricular premature depolarization: Secondary | ICD-10-CM

## 2024-05-24 DIAGNOSIS — E785 Hyperlipidemia, unspecified: Secondary | ICD-10-CM

## 2024-05-24 DIAGNOSIS — I255 Ischemic cardiomyopathy: Secondary | ICD-10-CM

## 2024-05-24 DIAGNOSIS — I2510 Atherosclerotic heart disease of native coronary artery without angina pectoris: Secondary | ICD-10-CM | POA: Diagnosis not present

## 2024-05-24 NOTE — Patient Instructions (Addendum)
 Medication Instructions:  Your physician recommends that you continue on your current medications as directed. Please refer to the Current Medication list given to you today.   *If you need a refill on your cardiac medications before your next appointment, please call your pharmacy*  Lab Work: None ordered at this time   Testing/Procedures: None ordered at this time   Follow-Up: At [facility], you and your health needs are our priority.  As part of our continuing mission to provide you with exceptional heart care, our providers are all part of one team.  This team includes your primary Cardiologist (physician) and Advanced Practice Providers or APPs (Physician Assistants and Nurse Practitioners) who all work together to provide you with the care you need, when you need it.  Your next appointment:   6 month(s)  Provider:   You may see Alm Clay, MD or one of the following Advanced Practice Providers on your designated Care Team:   Lonni Meager, NP Lesley Maffucci, PA-C Bernardino Bring, PA-C Cadence [place], PA-C Tylene Lunch, NP Barnie Hila, NP    We recommend signing up for the patient portal called MyChart.  Sign up information is provided on this After Visit Summary.  MyChart is used to connect with patients for Virtual Visits (Telemedicine).  Patients are able to view lab/test results, encounter notes, upcoming appointments, etc.  Non-urgent messages can be sent to your provider as well.   To learn more about what you can do with MyChart, go to [email or web address].

## 2024-05-24 NOTE — Progress Notes (Signed)
 Cardiology Office Note:  .   Date:  05/27/2024  ID:  Shane [name], DOB [birth year], MRN [id number] PCP: Shane Rollene MATSU, FNP  [facility] Providers Cardiologist:  Shane Clay, MD Cardiology APP:  Shane Bernardino HERO, PA-C  Electrophysiologist:  Shane ONEIDA HOLTS, MD  Electrophysiology APP:  Riddle, Suzann, NP     Chief Complaint  Patient presents with   Follow-up   Coronary Artery Disease    No active angina.  On maintenance dose of Brilinta    Cardiomyopathy    Reduced EF but no active heart failure symptoms.  ICD placed in May   Congestive Heart Failure    NYHA class I symptoms.    Patient Profile: .     Shane [name] is a relatively healthy appearing 53 y.o. male former smoker with a PMH notable for severe Inferolateral STEMI with resultant Ischemic Cardiomyopathy despite RCA PCI, s/p ICD for low EF and bursts of NSVT who presents here for 37-month follow-up at the request of Shane Rollene MATSU, FNP.  Shane [name] is a 53 y.o. male with a PMH notable for severe inferolateral infarct with resultant ischemic cardiomyopathy who presents here for 3 to 87-month follow-up at the request of Shane Rollene MATSU, FNP.  Notable PMH Inferolateral STEMI (09/02/2022) => suspected RV involvement: Initial EF~45%; also suspected prolonged occlusive time with delayed presentation CAD-PCI 100% heavily thrombotic occlusion of the RCA (extensive thrombectomy and DES PCI) => 2 overlapping 38 and 30 mm DES (proximal to distal RCA) restoring TIMI III flow => distal embolization down RPL and PDA ICM initial EF was 35 to 40%-most recent EF on echo 01/2023 was 40 to 45% => however worse on cardiac MRI (30%) -> NYHA class I-II symptoms at most.  Freq PVCs with NSVT -> ICD placed 02/2024 => currently on mexiletine 150 mg twice daily along with Toprol  100 mg daily    I last saw Shane [name] on January 19, 2024.  Monitor medication changes made.  He was then referred to EP for ICD  placement after seen by Dr. Cherrie on 01/23/2024.  Cardiac MRI revealed EF of 30% with akinetic inferior wall and transmural scar in the basal to mid inferior wall with no viability.  Zio patch monitor showed 21% PVC burden.  Mexiletine increased to 200 mg twice daily with Toprol  100 mg daily.  S/p ICD 02/09/24  Seen by Dr. Cherrie (Adv CHF) 03/29/24:   He reports stable NYHA Class II symptoms. Limited more by fatigue than dyspnea. Denies CP. Fairly asymptomatic w/ PVCs. Denies palpitations. No syncope/ near syncope. No LEE, orthopnea/PND or wt gain. Returns for routine f/u with his Mom. Feeling much better. Pending going back to work July 3rd at [employer] (8 weeks from ICD implant). Denies CP or SOB. Compliant with meds. = Sx NYHA I.  - Improved NYHA I.  Converted from losartan  to Entresto  49 to 51 mg twice daily.  Restarted Farxiga  10 mg daily continue Toprol  100 mg daily along with aspirin  81 mg daily and Crestor  40 g daily.  Plan would be to consider spironolactone  at next visit.  He was referred to cardiac rehab.  - Zio 11/24 21% burden >>LHC w/ no obstructive CAD - seen by EP, suspect scar mediated from prior inferior infarct => Continue mexilitene 200 bid - Refer for sleep study to rule out OSA, found to have mild OSA due for follow-up with Dr. [name]. Recommended staying off of Adderall .  He was just seen by  Elvie Needle, NP on 05/21/2024-doing well.  No ICD complaints.  No longer noticed palpitations.  Stable on mexiletine plus Toprol .  Noted he is back at work and doing well.  Trivial edema at the end of the day.  Subjective  Discussed the use of AI scribe software for clinical note transcription with the patient, who gave verbal consent to proceed.  History of Present Illness History of Present Illness Shane [name] is a 53 year old male with heart failure and a history of myocardial infarction who presents for follow-up regarding his cardiac condition.  He has a  history of delayed presentation of anterior MI with resultant heart failure and ventricular arrhythmia (VT and frequent PVCs). Defibrillator implanted on Feb 09, 2024, for short runs of ventricular tachycardia.  Now he only notes rare skipped beats but denies any chest pain or pressure at rest or exertion.,  Much more active but denies any exertional chest pain, dyspnea or exercise intolerance.  Only if he overdoes it in the significant he will he become symptomatic -> a little dizzy with fast heart rates.  No PND, topping or edema.  GDMT regimen includes Entresto  49/51 mg (recently switched from Losartan ), Toprol  XL 100 mg daily, Mexiletine 200 mg twice daily, Brilinta  60 mg daily, and Farxiga  10 mg daily.  He denies any adverse effects from current antiarrhythmic or heart failure medications.  He does note sensitivity to high heat and extreme cold temperatures as well. He has been able to maintain weights between 200 to 210 pounds with no significant edema.  Overall he notes improved energy level and has gotten back into full exercise and work.  He does weightlifting as well as cardio.  He previously had to take it easy at work and now working full-time with no issues.  He also has successfully quit smoking.  Lipid management has been suboptimal.  He is on Crestor  49 daily with last LDL reported 95.  He is due for repeat levels.  Cardiovascular ROS: no chest pain or dyspnea on exertion positive for - only notes intolerance to high heat/humidity and extreme cold.  Rare palpitations negative for - edema, irregular heartbeat, orthopnea, paroxysmal nocturnal dyspnea, rapid heart rate, shortness of breath, or syncope or near syncope, TIA or amaurosis fugax, claudication.  Melena, hematochezia hematuria or epistaxis.  ROS:  Review of Systems - Negative except relatively well-controlled anxiety.  He is on a combination of Wellbutrin , Zoloft  and BuSpar  as well as Remeron .    Objective   Current CV/DM  Medications  Medication Sig   aspirin  81 MG chewable tablet Chew 1 tablet (81 mg total) by mouth daily.   dapagliflozin  propanediol (FARXIGA ) 10 MG TABS tablet Take 1 tablet (10 mg total) by mouth daily.   metoprolol  succinate (TOPROL -XL) 100 MG 24 hr tablet Take 1 tablet (100 mg total) by mouth daily. Take 100 mg by mouth daily. Take with or immediately following a meal.   mexiletine (MEXITIL ) 200 MG capsule Take 1 capsule (200 mg total) by mouth 2 (two) times daily.   nicotine  (NICODERM CQ  - DOSED IN MG/24 HOURS) 14 mg/24hr patch Place 14 mg onto the skin as needed.   rosuvastatin  (CRESTOR ) 40 MG tablet Take 1 tablet (40 mg total) by mouth daily.   sacubitril -valsartan  (ENTRESTO ) 49-51 MG Take 1 tablet by mouth 2 (two) times daily.   ticagrelor  (BRILINTA ) 60 MG TABS tablet Take 1 tablet (60 mg total) by mouth 2 (two) times daily.   Noncardiac Meds  Medication Sig   buPROPion  (WELLBUTRIN  XL) 150 MG 24 hr tablet Take 1 tablet (150 mg total) by mouth every morning for 3 days, THEN 2 tablets (300 mg total) every morning.   busPIRone  (BUSPAR ) 5 MG tablet Take 1 tablet (5 mg total) by mouth 3 (three) times daily.   cetirizine (ZYRTEC) 10 MG tablet Take 10 mg by mouth daily. (Patient taking differently: Take 10 mg by mouth daily. Taking as needed)   cholecalciferol (VITAMIN D3) 25 MCG (1000 UNIT) tablet Take 1,000 Units by mouth daily.   cyanocobalamin  (VITAMIN B12) 1000 MCG tablet Take 1,000 mcg by mouth daily.   folic acid  (FOLVITE ) 1 MG tablet Take 1 mg by mouth daily.   mirtazapine  (REMERON ) 15 MG tablet Take 1 tablet (15 mg total) by mouth at bedtime. (Patient taking differently: Take 15 mg by mouth at bedtime. Using as needed)   nicotine  (NICODERM CQ  - DOSED IN MG/24 HOURS) 14 mg/24hr patch Place 14 mg onto the skin as needed.   sertraline  (ZOLOFT ) 100 MG tablet + 25 MG tablet Take 1 tablet (125 mg total) by mouth daily.   triamcinolone  ointment (KENALOG ) 0.5 % Apply 1 Application topically 2  (two) times daily. Use sparingly for < 1 week on hands with vaseline (Patient taking differently: Apply 1 Application topically 2 (two) times daily. Use sparingly for < 1 week on hands with vaseline  using as needed)    Working full-time at [employer]. Paternal uncle died at a young age from large MI.  Studies Reviewed: SABRA        Lab Results  Component Value Date   CHOL 186 12/21/2023   HDL 52 12/21/2023   LDLCALC 95 12/21/2023   TRIG 231 (H) 12/21/2023   CHOLHDL 3.6 12/21/2023   Initial Cardiac Cath/Revascularization 09/02/2022: Heavily thrombotic complete RCA occlusion with extensive thrombectomy of over 60 mm thrombus and resultant overlapping DES PCI of ostial to mid RCA with overlapping Onyx Frontier DES 3.5 mm 30 mm and 3.5 mm 30 mm postdilated to 3.65 mm restoring TIMI-3 flow with exception of distal embolization and PDA and RPL 1. Diagnostic: Dominance: Right      Intervention     Echo 4/23 EF 40-45%, RV nl  Zio 6/24 high PVC burden 21%, Toprol  XL increased   Echo 10/24 EF 40-45%, RV nl  Repeat Zio 12/24 PVC burden 21% => mexiletine added for PVC suppression Repeat LHC 12/24 showed widely patent RCA stents with no significant restenosis. Near normal left coronary arteries. LVEDP 14  cMRI 3/25 LVEF 30%, RVEF 41%, +global HK w/ inferior wall AK, + transmural scar in the basal-mid inferior wall (non viable). ECV and T2 values not documented.  ICD implant 02/09/24   Risk Assessment/Calculations:          Physical Exam:   VS:  BP 112/70 (BP Location: Left Arm, Patient Position: Sitting, Cuff Size: Normal)   Pulse 74   Ht 6' 2 (1.88 m)   Wt 208 lb 6.4 oz (94.5 kg)   SpO2 98%   BMI 26.76 kg/m    Wt Readings from Last 3 Encounters:  05/24/24 208 lb 6.4 oz (94.5 kg)  05/21/24 206 lb 12.8 oz (93.8 kg)  04/23/24 209 lb 11.2 oz (95.1 kg)    GEN: Well nourished, well groomed in no acute distress; healthy-appearing.  In good spirits. NECK: No JVD; No carotid  bruits CARDIAC: Normal S1, S2; RRR, no murmurs, rubs, gallops RESPIRATORY:  Clear to auscultation without rales, wheezing  or rhonchi ; nonlabored, good air movement. ABDOMEN: Soft, non-tender, non-distended EXTREMITIES:  No clubbing/cyanosis/edema; No deformity      ASSESSMENT AND PLAN: .    Problem List Items Addressed This Visit       Cardiology Problems   Chronic combined systolic and diastolic heart failure (HCC) (Chronic)   Unfortunately, follow-up cardiac MRI showed significant reduced EF with a transmural thickness infarct in the inferior wall.  His EF seems to have dropped from initial evaluation where it was 40 to 45% by Echo in April of last year and again in October 2024 but with PVCs cardiac MRI indicated EF of 30%. NYHA Class I Symptoms On stable GDMT for now: Tolerating current dose of Entresto  49-51 mg twice daily along with Toprol  100 mg daily and Farxiga  10 mg daily Of the main CHF pillars he is not on spironolactone or standing dose loop diuretic. => Can consider adding spironolactone, but blood pressure somewhat borderline. ICD in place       Coronary artery disease involving native coronary artery without angina pectoris - Primary (Chronic)   Extensive thrombus burden in the RCA-despite urgent revascularization with extensive thrombectomy and DES placement, the inferior wall is essentially infarct and with full-thickness transmural scar noted on cardiac MRI. 2 overlapping DES stents covering base of the proximal to distal RCA. No recurrent angina. On maintenance aspirin  81 mg and Brilinta  60 mg twice daily Stop aspirin  81 mg daily and continue with Thienopyridine DAPT using Brilinta  60 mg twice daily as long as no significant bleeding. Okay to hold Brilinta  5 to 7 days preop for surgeries or procedures. Continue Toprol  XL 100 mg daily along with Entresto  49 to 51 mg twice daily Continue Crestor  40 mg daily      Frequent PVCs; with NSVT (Chronic)   Significant  PVCs noted with extensive myocardial scarring on cardiac MRI now status post ICD placement. Mexiletine started by EP, has been titrated up to 200 mg twice daily along with Toprol  100 mg daily. Relatively asymptomatic with no breakthrough spells.  He does not notice the palpitations      Hyperlipidemia with target low density lipoprotein (LDL) cholesterol less than 55 mg/dL (Chronic)   Lipids were last checked in March.  LDL was 95.  Has not been addressed beyond continuing Crestor  40 mg daily.  Goal is less than 55. Recheck lipid panel when he comes in for his follow-up echocardiogram in September. - Consider additional lipid-lowering therapy based on results.      Ischemic cardiomyopathy (Chronic)   Cardiac MRI indicated EF of 30% with reduced RV function as well EF 41%.  Global hypokinesis with inferior wall akinesis.  Transmural infarct in the basal to mid inferior wall with no viability.  On pretty stable GDMT for CHF/ICM. NYHA class I symptoms.  Plan per Dr. Adora to reassess echo in September (ordered for September 23rd)             Follow-Up: Return in about 6 months (around 11/24/2024) for [place] office.     Signed, Shane MICAEL Clay, MD, MS Shane [name], M.D., M.S. Interventional Chartered certified accountant  Pager # [phone]

## 2024-05-27 ENCOUNTER — Encounter: Payer: Self-pay | Admitting: Cardiology

## 2024-05-27 NOTE — Assessment & Plan Note (Addendum)
 Extensive thrombus burden in the RCA-despite urgent revascularization with extensive thrombectomy and DES placement, the inferior wall is essentially infarct and with full-thickness transmural scar noted on cardiac MRI. 2 overlapping DES stents covering base of the proximal to distal RCA. No recurrent angina. On maintenance aspirin  81 mg and Brilinta  60 mg twice daily Stop aspirin  81 mg daily and continue with Thienopyridine DAPT using Brilinta  60 mg twice daily as long as no significant bleeding. Okay to hold Brilinta  5 to 7 days preop for surgeries or procedures. Continue Toprol  XL 100 mg daily along with Entresto  49 to 51 mg twice daily Continue Crestor  40 mg daily

## 2024-05-27 NOTE — Assessment & Plan Note (Signed)
 Lipids were last checked in March.  LDL was 95.  Has not been addressed beyond continuing Crestor  40 mg daily.  Goal is less than 55. Recheck lipid panel when he comes in for his follow-up echocardiogram in September. - Consider additional lipid-lowering therapy based on results.

## 2024-05-27 NOTE — Assessment & Plan Note (Signed)
 Significant PVCs noted with extensive myocardial scarring on cardiac MRI now status post ICD placement. Mexiletine started by EP, has been titrated up to 200 mg twice daily along with Toprol  100 mg daily. Relatively asymptomatic with no breakthrough spells.  He does not notice the palpitations

## 2024-05-27 NOTE — Assessment & Plan Note (Signed)
 Unfortunately, follow-up cardiac MRI showed significant reduced EF with a transmural thickness infarct in the inferior wall.  His EF seems to have dropped from initial evaluation where it was 40 to 45% by Echo in April of last year and again in October 2024 but with PVCs cardiac MRI indicated EF of 30%. NYHA Class I Symptoms On stable GDMT for now: Tolerating current dose of Entresto  49-51 mg twice daily along with Toprol  100 mg daily and Farxiga  10 mg daily Of the main CHF pillars he is not on spironolactone or standing dose loop diuretic. => Can consider adding spironolactone, but blood pressure somewhat borderline. ICD in place

## 2024-05-27 NOTE — Assessment & Plan Note (Addendum)
 Cardiac MRI indicated EF of 30% with reduced RV function as well EF 41%.  Global hypokinesis with inferior wall akinesis.  Transmural infarct in the basal to mid inferior wall with no viability.  On pretty stable GDMT for CHF/ICM. NYHA class I symptoms.  Plan per Dr. Adora to reassess echo in September (ordered for September 23rd)

## 2024-05-28 ENCOUNTER — Ambulatory Visit

## 2024-05-30 ENCOUNTER — Ambulatory Visit

## 2024-05-30 ENCOUNTER — Ambulatory Visit
Admission: EM | Admit: 2024-05-30 | Discharge: 2024-05-30 | Disposition: A | Discharge status: Home / Self Care | Attending: Emergency Medicine | Admitting: Emergency Medicine

## 2024-05-30 ENCOUNTER — Other Ambulatory Visit: Payer: Self-pay

## 2024-05-30 DIAGNOSIS — J069 Acute upper respiratory infection, unspecified: Secondary | ICD-10-CM | POA: Insufficient documentation

## 2024-05-30 LAB — RESP PANEL BY RT-PCR (FLU A&B, COVID) ARPGX2
Influenza A by PCR: NEGATIVE
Influenza B by PCR: NEGATIVE
SARS Coronavirus 2 by RT PCR: NEGATIVE

## 2024-05-30 MED ORDER — IPRATROPIUM BROMIDE 0.06 % NA SOLN
2.0000 | Freq: Four times a day (QID) | NASAL | 12 refills | Status: AC
  Filled 2024-05-30: qty 15, 10d supply, fill #0

## 2024-05-30 NOTE — ED Triage Notes (Signed)
 Pt c/o bodyaches,HA,chills & nausea x1 day. Denies any fevers. Has tried tylenol  w/o relief.

## 2024-05-30 NOTE — ED Provider Notes (Signed)
 [facility]    CSN: [id number] Arrival date & time: 05/30/24  0955      History   Chief Complaint Chief Complaint  Patient presents with   Headache   Nausea   Generalized Body Aches    HPI Shane [name] is a 53 y.o. male.   HPI  53 year old male with past medical history significant for hyperlipidemia, essential hypertension, status post inferior lateral STEMI with AICD pacer implantation, ischemic cardiomyopathy, ADD, history of EtOH abuse, mild persistent asthma, depression, and chronic heart failure with reduced ejection fraction presenting for evaluation of flu/COVID-like symptoms that started yesterday.  These include headache, body aches, chills, runny nose, nasal congestion, shortness of breath, dizziness, and nausea.  He denies any sore throat, ear pain, fever, or cough.  No known sick contacts or recent travel out of state or country.  Past Medical History:  Diagnosis Date   Acute ST elevation myocardial infarction (STEMI) of inferolateral wall (HCC) 09/02/2022   Extensive thrombus in the RCA-thrombectomy with overlapping 3.5 mm stents (38 and 30 mm)   ADD (attention deficit disorder)    Alcohol abuse    History of.   Anxiety    Chronic HFrEF (heart failure with reduced ejection fraction) (HCC) 08/2022   EF by cardiac MRI 30%.  ICD placed for runs of NSVT as well as PVC burden of 21% (02/09/2024   Coronary artery disease involving native coronary artery of native heart 08/2022   a. 08/2022 Inflat STEMI/PCI: LM nl, LAD nl, D1/2 nl, RI nl, LCX nl, OM1/2 nl, RCA 100p/m (thrombectomy & 3.5x38 & 3.5x30 overlapping Onyx Frontier DESs), 29m/d (thombectomy and PTCA into RPAV branch), RPDA 100, RPL1 100, EF 45-50%.   Depression    Frequent PVCs 02/17/2023   Hyperlipidemia    Hypertension    Ischemic cardiomyopathy 02/17/2023   09/2022 (inferior STEMI) echo: EF 35-40%, glob HK, GrI DD, mildly reduced RV fxn, RVSP 31.71mmHg, no significant valvular disease.  TTE  follow-up 01/06/2023: EF remains 40 to 45% with mild to moderate dysfunction       Patient Active Problem List   Diagnosis Date Noted   ICD (implantable cardioverter-defibrillator) in place 02/09/2024   Thin nails 10/19/2023   Fatigue 10/19/2023   Coronary artery disease involving native coronary artery without angina pectoris 02/17/2023   Frequent PVCs; with NSVT 02/17/2023   Ischemic cardiomyopathy 02/17/2023   Chronic combined systolic and diastolic heart failure (HCC) 09/04/2022   ST elevation myocardial infarction (STEMI) of inferolateral wall (HCC) 09/02/2022   Presence of drug coated stent in right coronary artery 09/02/2022   Eosinophilia 03/03/2022   Elevated hemoglobin (HCC) 01/01/2022   Rash 04/15/2020   Open wound of ear 03/05/2020   Snores 05/08/2018   Right elbow pain 05/08/2018   Essential hypertension 12/05/2017   Eczema 12/05/2017   Wellness examination 12/05/2017   Tobacco abuse 12/05/2017   Acne 04/22/2017   Recurrent major depressive disorder, in full remission (HCC) 01/14/2017   Anxiety and depression 07/09/2016   Mild persistent asthma without complication 10/21/2015   H/O ETOH abuse 12/18/2013   ADD (attention deficit disorder) 04/07/2013   Hyperlipidemia with target low density lipoprotein (LDL) cholesterol less than 55 mg/dL 96/83/7985    Past Surgical History:  Procedure Laterality Date   Cardiac MRI  12/2023   LVEF 30%, RVEF 41%, +global HK w/ inferior wall AK, + transmural scar in the basal-mid inferior wall (non viable). ECV and T2 values not documented.   CORONARY/GRAFT ACUTE MI REVASCULARIZATION  N/A 09/02/2022   Procedure: Coronary/Graft Acute MI Revascularization;  Surgeon: Anner Alm ORN, MD;  Location: [facility] INVASIVE CV LAB;  Service: Cardiovascular;  Laterality: N/A;   ICD IMPLANT N/A 02/09/2024   Procedure: ICD IMPLANT;  Surgeon: Cindie Ole DASEN, MD;  Location: [facility] INVASIVE CV LAB;  Service: Cardiovascular;  Laterality: N/A;   LEFT HEART  CATH AND CORONARY ANGIOGRAPHY N/A 09/15/2023   Procedure: LEFT HEART CATH AND CORONARY ANGIOGRAPHY;  Surgeon: Darron Deatrice LABOR, MD;  Location: [facility] INVASIVE CV LAB;  Service: Cardiovascular;  Laterality: N/A;   LEFT HEART CATH AND CORS/GRAFTS ANGIOGRAPHY N/A 09/02/2022   Procedure: LEFT HEART CATH AND CORS/GRAFTS ANGIOGRAPHY;  Surgeon: Anner Alm ORN, MD;  Location: [facility] INVASIVE CV LAB;  Service: Cardiovascular;  Laterality: N/A;   TENDON REPAIR Right 09/15/2015   Procedure: RIGHT FOREARM EXPLORATION AND CLOSURE OF LACERATION, TENDON REPAIR;  Surgeon: Prentice Pagan, MD;  Location: [facility] OR;  Service: Orthopedics;  Laterality: Right;   TRANSTHORACIC ECHOCARDIOGRAM  07/2023   Echo 4/23 EF 40-45%, RV nl ; Echo 10/24 EF 40-45%, RV nl   Zio patch  09/2023   21% PVC burden; runs of NSVT. => Referred for ICD       Home Medications    Prior to Admission medications   Medication Sig Start Date End Date Taking? Authorizing Provider  ipratropium (ATROVENT ) 0.06 % nasal spray Place 2 sprays into both nostrils 4 (four) times daily. 05/30/24  Yes Bernardino Ditch, NP  aspirin  81 MG chewable tablet Chew 1 tablet (81 mg total) by mouth daily. 09/05/22   Patel, Sona, MD  buPROPion  (WELLBUTRIN  XL) 150 MG 24 hr tablet Take 1 tablet (150 mg total) by mouth every morning for 3 days, THEN 2 tablets (300 mg total) every morning. 04/30/24 07/29/24  Dineen Rollene MATSU, FNP  busPIRone  (BUSPAR ) 5 MG tablet Take 1 tablet (5 mg total) by mouth 3 (three) times daily. 12/21/23   Dineen Rollene MATSU, FNP  cetirizine (ZYRTEC) 10 MG tablet Take 10 mg by mouth daily. Patient taking differently: Take 10 mg by mouth daily. Taking as needed    [provider]  cholecalciferol (VITAMIN D3) 25 MCG (1000 UNIT) tablet Take 1,000 Units by mouth daily.    [provider]  cyanocobalamin  (VITAMIN B12) 1000 MCG tablet Take 1,000 mcg by mouth daily.    [provider]  dapagliflozin  propanediol (FARXIGA ) 10 MG TABS  tablet Take 1 tablet (10 mg total) by mouth daily. 01/23/24   Bensimhon, Toribio SAUNDERS, MD  folic acid  (FOLVITE ) 1 MG tablet Take 1 mg by mouth daily.    [provider]  metoprolol  succinate (TOPROL -XL) 100 MG 24 hr tablet Take 1 tablet (100 mg total) by mouth daily. Take 100 mg by mouth daily. Take with or immediately following a meal. 01/24/24   Dunn, Bernardino HERO, PA-C  mexiletine (MEXITIL ) 200 MG capsule Take 1 capsule (200 mg total) by mouth 2 (two) times daily. 01/23/24   Bensimhon, Toribio SAUNDERS, MD  mirtazapine  (REMERON ) 15 MG tablet Take 1 tablet (15 mg total) by mouth at bedtime. Patient taking differently: Take 15 mg by mouth at bedtime. Using as needed 03/22/23 05/24/24  Dineen Rollene MATSU, FNP  nicotine  (NICODERM CQ  - DOSED IN MG/24 HOURS) 14 mg/24hr patch Place 14 mg onto the skin as needed.    [provider]  rosuvastatin  (CRESTOR ) 40 MG tablet Take 1 tablet (40 mg total) by mouth daily. 04/30/24   Anner Alm ORN, MD  sacubitril -valsartan  (ENTRESTO ) [phone]  MG Take 1 tablet by mouth 2 (two) times daily. 03/29/24   Bensimhon, Toribio SAUNDERS, MD  sertraline  (ZOLOFT ) 100 MG tablet Take 1 tablet (100 mg total) by mouth daily. 04/30/24 04/30/25  Dineen Rollene MATSU, FNP  sertraline  (ZOLOFT ) 25 MG tablet Take 1 tablet (25 mg total) by mouth daily. 01/24/24   Dineen Rollene MATSU, FNP  ticagrelor  (BRILINTA ) 60 MG TABS tablet Take 1 tablet (60 mg total) by mouth 2 (two) times daily. 10/18/23   Dunn, Bernardino HERO, PA-C  triamcinolone  ointment (KENALOG ) 0.5 % Apply 1 Application topically 2 (two) times daily. Use sparingly for < 1 week on hands with vaseline Patient taking differently: Apply 1 Application topically 2 (two) times daily. Use sparingly for < 1 week on hands with vaseline  using as needed 10/19/23   Dineen Rollene MATSU, FNP    Family History Family History  Problem Relation Age of Onset   Prostate cancer Father    Hyperlipidemia Father    Hypertension Father    Lung cancer Maternal Grandfather     Heart attack Maternal Grandfather 36       died   Alcohol abuse Paternal Grandfather    Hypertension Cousin    Hyperlipidemia Other    Heart attack Paternal Uncle 59       died   Heart attack Maternal Great-grandfather 49   Alcohol abuse Maternal Aunt     Social History Social History   Tobacco Use   Smoking status: Former    Current packs/day: 0.00    Average packs/day: 0.3 packs/day for 20.0 years (5.0 ttl pk-yrs)    Types: Cigarettes    Start date: 09/02/2002    Quit date: 09/02/2022    Years since quitting: 1.7   Smokeless tobacco: Never   Tobacco comments:    I pack  20 cigarettes a week  wants to  QUIT.    Vaping Use   Vaping status: Never Used  Substance Use Topics   Alcohol use: No    Comment: no alcohol since 12/16/2012   Drug use: No     Allergies   Patient has no known allergies.   Review of Systems Review of Systems  Constitutional:  Positive for chills. Negative for fever.  HENT:  Positive for congestion and rhinorrhea. Negative for ear pain and sore throat.   Respiratory:  Positive for shortness of breath. Negative for cough.   Cardiovascular:  Negative for chest pain.  Gastrointestinal:  Positive for nausea. Negative for vomiting.  Musculoskeletal:  Positive for arthralgias and myalgias.  Neurological:  Positive for headaches.     Physical Exam Triage Vital Signs ED Triage Vitals [05/30/24 1055]  Encounter Vitals Group     BP      Girls Systolic BP Percentile      Girls Diastolic BP Percentile      Boys Systolic BP Percentile      Boys Diastolic BP Percentile      Pulse      Resp 16     Temp      Temp Source Oral     SpO2      Weight      Height      Head Circumference      Peak Flow      Pain Score      Pain Loc      Pain Education      Exclude from Growth Chart    No data found.  Updated Vital Signs BP ROLLEN)  151/99 (BP Location: Right Arm)   Pulse (!) 58   Temp 98.9 F (37.2 C) (Oral)   Resp 16   Ht 6' 2 (1.88 m)   Wt  208 lb 6.4 oz (94.5 kg)   SpO2 98%   BMI 26.76 kg/m   Visual Acuity Right Eye Distance:   Left Eye Distance:   Bilateral Distance:    Right Eye Near:   Left Eye Near:    Bilateral Near:     Physical Exam Vitals and nursing note reviewed.  Constitutional:      Appearance: Normal appearance. He is not ill-appearing.  HENT:     Head: Normocephalic and atraumatic.     Right Ear: Tympanic membrane, ear canal and external ear normal. There is no impacted cerumen.     Left Ear: Tympanic membrane, ear canal and external ear normal. There is no impacted cerumen.     Nose: Congestion and rhinorrhea present.     Comments: His mucosa is edematous and erythematous with scant clear discharge in both nares.    Mouth/Throat:     Mouth: Mucous membranes are moist.     Pharynx: Oropharynx is clear. No oropharyngeal exudate or posterior oropharyngeal erythema.  Cardiovascular:     Rate and Rhythm: Normal rate and regular rhythm.     Pulses: Normal pulses.     Heart sounds: Normal heart sounds. No murmur heard.    No friction rub. No gallop.  Pulmonary:     Effort: Pulmonary effort is normal.     Breath sounds: Normal breath sounds. No wheezing, rhonchi or rales.  Musculoskeletal:     Cervical back: Normal range of motion and neck supple. No tenderness.  Lymphadenopathy:     Cervical: No cervical adenopathy.  Skin:    General: Skin is warm and dry.     Capillary Refill: Capillary refill takes less than 2 seconds.     Findings: No rash.  Neurological:     General: No focal deficit present.     Mental Status: He is alert and oriented to person, place, and time.      UC Treatments / Results  Labs (all labs ordered are listed, but only abnormal results are displayed) Labs Reviewed  RESP PANEL BY RT-PCR (FLU A&B, COVID) [id number]    EKG   Radiology No results found.  Procedures Procedures (including critical care time)  Medications Ordered in UC Medications - No data to  display  Initial Impression / Assessment and Plan / UC Course  I have reviewed the triage vital signs and the nursing notes.  Pertinent labs & imaging results that were available during my care of the patient were reviewed by me and considered in my medical decision making (see chart for details).   Patient is a nontoxic-appearing 53 year old male presenting for evaluation of COVID/flulike symptoms as outlined in the HPI above.  He has been using Tylenol  but is not taking any other cold medication.  He reports feeling shortness of breath though he is able to speak in full sentences without dyspnea or tachypnea.  Respiratory rate at triage was 16 with a 98% room air oxygen saturation.  His physical exam reveals inflamed nasal mucosa with scant clear discharge in both nares.  Otoscopic exam, oropharyngeal exam, and cardiopulmonary exams are all benign.  Differential diagnose include COVID, influenza, viral respiratory illness.  I will order a COVID and flu PCR.  Respiratory panel is negative for COVID or influenza.  I will discharge patient home  with a diagnosis of viral URI.  Option of Atrovent  is a bit of nasal congestion.  He may use over-the-counter Tylenol  and/or ibuprofen as needed for any fever or pain.  Return precautions reviewed.   Final Clinical Impressions(s) / UC Diagnoses   Final diagnoses:  Viral URI     Discharge Instructions      Your respiratory panel is negative for COVID or influenza.  Based on your physical exam I do believe you have a viral respiratory illness which is causing your symptoms.  Use over-the-counter Tylenol  and/or ibuprofen as needed for any pain or if you develop fever.  Use the Atrovent  nasal spray, 2 squirts up each nostril every 6 hours, as needed for any runny nose or nasal congestion.  If you develop a cough please use over-the-counter cough preparations such as Delsym, Robitussin, or Zarbee's.  Follow the package instructions for dosing.  If  you develop any new or worsening symptoms other return for reevaluation or follow-up with your primary care provider.     ED Prescriptions     Medication Sig Dispense Auth. Provider   ipratropium (ATROVENT ) 0.06 % nasal spray Place 2 sprays into both nostrils 4 (four) times daily. 15 mL Bernardino Ditch, NP      PDMP not reviewed this encounter.   Bernardino Ditch, NP 05/30/24 1150

## 2024-05-30 NOTE — Discharge Instructions (Addendum)
 Your respiratory panel is negative for COVID or influenza.  Based on your physical exam I do believe you have a viral respiratory illness which is causing your symptoms.  Use over-the-counter Tylenol  and/or ibuprofen as needed for any pain or if you develop fever.  Use the Atrovent  nasal spray, 2 squirts up each nostril every 6 hours, as needed for any runny nose or nasal congestion.  If you develop a cough please use over-the-counter cough preparations such as Delsym, Robitussin, or Zarbee's.  Follow the package instructions for dosing.  If you develop any new or worsening symptoms other return for reevaluation or follow-up with your primary care provider.

## 2024-05-31 ENCOUNTER — Ambulatory Visit

## 2024-06-06 ENCOUNTER — Ambulatory Visit

## 2024-06-07 ENCOUNTER — Ambulatory Visit

## 2024-06-11 ENCOUNTER — Ambulatory Visit

## 2024-06-11 ENCOUNTER — Other Ambulatory Visit: Payer: Self-pay

## 2024-06-13 ENCOUNTER — Ambulatory Visit

## 2024-06-14 ENCOUNTER — Ambulatory Visit

## 2024-06-15 ENCOUNTER — Other Ambulatory Visit: Payer: Self-pay

## 2024-06-15 ENCOUNTER — Other Ambulatory Visit: Payer: Self-pay | Admitting: Family

## 2024-06-15 DIAGNOSIS — F3342 Major depressive disorder, recurrent, in full remission: Secondary | ICD-10-CM

## 2024-06-15 MED ORDER — MIRTAZAPINE 15 MG PO TABS
15.0000 mg | ORAL_TABLET | Freq: Every day | ORAL | 1 refills | Status: DC
  Filled 2024-06-15: qty 30, 30d supply, fill #0

## 2024-06-15 MED FILL — Rosuvastatin Calcium Tab 40 MG: ORAL | 30 days supply | Qty: 30 | Fill #1 | Status: AC

## 2024-06-18 ENCOUNTER — Ambulatory Visit

## 2024-06-20 ENCOUNTER — Ambulatory Visit

## 2024-06-21 ENCOUNTER — Ambulatory Visit

## 2024-06-22 ENCOUNTER — Other Ambulatory Visit: Payer: Self-pay | Admitting: Internal Medicine

## 2024-06-22 ENCOUNTER — Other Ambulatory Visit: Payer: Self-pay

## 2024-06-22 ENCOUNTER — Other Ambulatory Visit: Payer: Self-pay | Admitting: Family

## 2024-06-22 DIAGNOSIS — F325 Major depressive disorder, single episode, in full remission: Secondary | ICD-10-CM

## 2024-06-22 MED ORDER — DAPAGLIFLOZIN PROPANEDIOL 10 MG PO TABS
10.0000 mg | ORAL_TABLET | Freq: Every day | ORAL | 2 refills | Status: AC
  Filled 2024-06-22: qty 30, 30d supply, fill #0
  Filled 2024-10-24: qty 30, 30d supply, fill #1

## 2024-06-22 MED ORDER — BUSPIRONE HCL 5 MG PO TABS
5.0000 mg | ORAL_TABLET | Freq: Three times a day (TID) | ORAL | 1 refills | Status: DC
  Filled 2024-06-22: qty 90, 30d supply, fill #0

## 2024-06-25 ENCOUNTER — Telehealth: Payer: Self-pay | Admitting: Internal Medicine

## 2024-06-25 ENCOUNTER — Other Ambulatory Visit: Payer: Self-pay

## 2024-06-25 ENCOUNTER — Ambulatory Visit

## 2024-06-25 MED ORDER — ERYTHROMYCIN 5 MG/GM OP OINT
TOPICAL_OINTMENT | OPHTHALMIC | 0 refills | Status: DC
  Filled 2024-06-25: qty 3.5, 7d supply, fill #0

## 2024-06-25 NOTE — Telephone Encounter (Signed)
 Called to confirm/remind patient of their appointment at the [facility] on 06/26/24.   Appointment:   [x] Confirmed  [] Left mess   [] No answer/No voice mail  [] VM Full/unable to leave message  [] Phone not in service  Patient reminded to bring all medications and/or complete list.  Confirmed patient has transportation. Gave directions, instructed to utilize valet parking.

## 2024-06-26 ENCOUNTER — Ambulatory Visit

## 2024-06-26 ENCOUNTER — Ambulatory Visit (HOSPITAL_BASED_OUTPATIENT_CLINIC_OR_DEPARTMENT_OTHER): Admitting: Internal Medicine

## 2024-06-26 ENCOUNTER — Other Ambulatory Visit: Payer: Self-pay

## 2024-06-26 ENCOUNTER — Ambulatory Visit
Admission: RE | Admit: 2024-06-26 | Discharge: 2024-06-26 | Disposition: A | Discharge status: Home / Self Care | Source: Ambulatory Visit | Attending: Internal Medicine | Admitting: Internal Medicine

## 2024-06-26 ENCOUNTER — Inpatient Hospital Stay (HOSPITAL_COMMUNITY)
Admission: RE | Admit: 2024-06-26 | Discharge: 2024-06-26 | Disposition: A | Discharge status: Home / Self Care | Source: Ambulatory Visit | Attending: Internal Medicine | Admitting: Internal Medicine

## 2024-06-26 ENCOUNTER — Other Ambulatory Visit (HOSPITAL_COMMUNITY): Payer: Self-pay | Admitting: Internal Medicine

## 2024-06-26 VITALS — BP 153/96 | HR 64 | Wt 213.0 lb

## 2024-06-26 DIAGNOSIS — I2510 Atherosclerotic heart disease of native coronary artery without angina pectoris: Secondary | ICD-10-CM | POA: Diagnosis not present

## 2024-06-26 DIAGNOSIS — I493 Ventricular premature depolarization: Secondary | ICD-10-CM

## 2024-06-26 DIAGNOSIS — I351 Nonrheumatic aortic (valve) insufficiency: Secondary | ICD-10-CM | POA: Diagnosis not present

## 2024-06-26 DIAGNOSIS — G4733 Obstructive sleep apnea (adult) (pediatric): Secondary | ICD-10-CM | POA: Diagnosis not present

## 2024-06-26 DIAGNOSIS — F17210 Nicotine dependence, cigarettes, uncomplicated: Secondary | ICD-10-CM | POA: Diagnosis not present

## 2024-06-26 DIAGNOSIS — Z955 Presence of coronary angioplasty implant and graft: Secondary | ICD-10-CM | POA: Insufficient documentation

## 2024-06-26 DIAGNOSIS — Z720 Tobacco use: Secondary | ICD-10-CM | POA: Diagnosis not present

## 2024-06-26 DIAGNOSIS — Z79899 Other long term (current) drug therapy: Secondary | ICD-10-CM | POA: Diagnosis not present

## 2024-06-26 DIAGNOSIS — I5042 Chronic combined systolic (congestive) and diastolic (congestive) heart failure: Secondary | ICD-10-CM | POA: Diagnosis not present

## 2024-06-26 DIAGNOSIS — I5022 Chronic systolic (congestive) heart failure: Secondary | ICD-10-CM | POA: Diagnosis not present

## 2024-06-26 DIAGNOSIS — I255 Ischemic cardiomyopathy: Secondary | ICD-10-CM | POA: Diagnosis not present

## 2024-06-26 DIAGNOSIS — Z7984 Long term (current) use of oral hypoglycemic drugs: Secondary | ICD-10-CM | POA: Insufficient documentation

## 2024-06-26 DIAGNOSIS — I110 Hypertensive heart disease with heart failure: Secondary | ICD-10-CM | POA: Insufficient documentation

## 2024-06-26 DIAGNOSIS — I252 Old myocardial infarction: Secondary | ICD-10-CM | POA: Insufficient documentation

## 2024-06-26 DIAGNOSIS — Z95810 Presence of automatic (implantable) cardiac defibrillator: Secondary | ICD-10-CM | POA: Insufficient documentation

## 2024-06-26 LAB — ECHOCARDIOGRAM COMPLETE
AR max vel: 3 cm2
AV Area VTI: 3.03 cm2
AV Area mean vel: 2.91 cm2
AV Mean grad: 4 mmHg
AV Peak grad: 7.1 mmHg
Ao pk vel: 1.33 m/s
Area-P 1/2: 2.95 cm2
Calc EF: 48.5 %
MV VTI: 3.57 cm2
S' Lateral: 5.1 cm
Single Plane A2C EF: 59.5 %
Single Plane A4C EF: 38.9 %

## 2024-06-26 MED ORDER — SPIRONOLACTONE 25 MG PO TABS
25.0000 mg | ORAL_TABLET | Freq: Every day | ORAL | 11 refills | Status: AC
  Filled 2024-06-26: qty 30, 30d supply, fill #0
  Filled 2024-10-24: qty 30, 30d supply, fill #1

## 2024-06-26 NOTE — Patient Instructions (Signed)
 Medication Changes:  Start Spironolactone  25 MG once daily   Lab Work:  Go over to the [facility]. Go pass the gift shop and have your blood work completed 1-2 weeks.  We will only call you if the results are abnormal or if the provider would like to make medication changes.  No news is good news.   Testing/Procedures:  Your provider has recommended that  you wear a Zio Patch for 7 days. This monitor will record your heart rhythm for our review.  IF you have any symptoms while wearing the monitor please press the button.  If you have any issues with the patch or you notice a red or orange light on it please call the company at [phone].  Once you remove the patch please mail it back to the company as soon as possible so we can get the results.    Follow-Up in: 4 months with Dr. Bensimhon.  Our Doctors' schedules are NOT open yet for 4 months. We will place you on our recall list. Once they are available, we will call you to schedule your follow up appointment.    Thank you for choosing [facility].    At the [facility], you and your health needs are our priority. We have a designated team specialized in the treatment of Heart Failure. This Care Team includes your primary Heart Failure Specialized Cardiologist (physician), Advanced Practice Providers (APPs- Physician Assistants and Nurse Practitioners), and Pharmacist who all work together to provide you with the care you need, when you need it.   You may see any of the following providers on your designated Care Team at your next follow up:  Dr. Toribio Fuel Dr. Ezra Shuck Dr. Ria Commander Dr. Morene Brownie Ellouise Class, FNP Jaun Bash, RPH-CPP  Please be sure to bring in all your medications bottles to every appointment.   Need to Contact Us :  If you have any questions or concerns before your next appointment please send us  a message through  [place] or call our office at [phone].    TO LEAVE A MESSAGE FOR THE NURSE SELECT OPTION 2, PLEASE LEAVE A MESSAGE INCLUDING: YOUR NAME DATE OF BIRTH CALL BACK NUMBER REASON FOR CALL**this is important as we prioritize the call backs  YOU WILL RECEIVE A CALL BACK THE SAME DAY AS LONG AS YOU CALL BEFORE 4:00 PM

## 2024-06-26 NOTE — Progress Notes (Signed)
 [facility] CONSULT NOTE  Referring Physician: Dineen Rollene MATSU, FNP Primary Care: Dineen Rollene MATSU, FNP Primary Cardiologist: Alm Clay, MD  Chief Complaint: Chronic Systolic Heart Failure  HPI:  Mr. Wesely has been referred to [facility] by Bernardino Bring, PA-C, for evaluation for chronic systolic heart failure.   53 y/o male w/ h/o chronic systolic heart failure, frequent PVCs, CAD s/p acute inferior STEMI 08/2022 w/ cath showing severe single vessel occlusive disease w/ 100% p-mid RCA, 95%, m-dRCA w/ heavy clot burden treated w/ aspiration thrombectomy and DES. Normal left coronary system. Echo EF 35-40%, RV mildly reduced.   Echo 4/23 EF 40-45%, RV nl   Zio 6/24 high PVC burden 21%, Toprol  XL increased    Echo 10/24 EF 40-45%, RV nl   Repeat Zio 12/24 PVC burden 21%   Repeat LHC 12/24 showed widely patent RCA stents with no significant restenosis. Near normal left coronary arteries. LVEDP 14  cMRI 3/25 LVEF 30%, RVEF 41%, +global HK w/ inferior wall AK, + transmural scar in the basal-mid inferior wall (non viable). ECV and T2 values not documented.   Mexile'j nqm also added for PVC suppression   ICD implant 02/09/24   Sleep study 7/25 mild OSA (AHI 7)   I saw him for the first time in [facility] Clinic in 6/25. Doing well NYHA I   Returns for f/u: Now back to work July 3rd at [employer]. Feels great. No CP or SOB. Playing disc golf. Compliant with meds BP has been up. Smoking a couple cigs per week   ICD interrogation today: No VT/AF. Volume ok Activity 3.2hr/day Personally reviewed    Past Medical History:  Diagnosis Date   Acute ST elevation myocardial infarction (STEMI) of inferolateral wall (HCC) 09/02/2022   Extensive thrombus in the RCA-thrombectomy with overlapping 3.5 mm stents (38 and 30 mm)   ADD (attention deficit disorder)    Alcohol abuse    History of.   Anxiety    Chronic HFrEF (heart failure with reduced ejection fraction) (HCC) 08/2022   EF  by cardiac MRI 30%.  ICD placed for runs of NSVT as well as PVC burden of 21% (02/09/2024   Coronary artery disease involving native coronary artery of native heart 08/2022   a. 08/2022 Inflat STEMI/PCI: LM nl, LAD nl, D1/2 nl, RI nl, LCX nl, OM1/2 nl, RCA 100p/m (thrombectomy & 3.5x38 & 3.5x30 overlapping Onyx Frontier DESs), 65m/d (thombectomy and PTCA into RPAV branch), RPDA 100, RPL1 100, EF 45-50%.   Depression    Frequent PVCs 02/17/2023   Hyperlipidemia    Hypertension    Ischemic cardiomyopathy 02/17/2023   09/2022 (inferior STEMI) echo: EF 35-40%, glob HK, GrI DD, mildly reduced RV fxn, RVSP 31.48mmHg, no significant valvular disease.  TTE follow-up 01/06/2023: EF remains 40 to 45% with mild to moderate dysfunction       Current Outpatient Medications  Medication Sig Dispense Refill   aspirin  81 MG chewable tablet Chew 1 tablet (81 mg total) by mouth daily. 30 tablet 1   buPROPion  (WELLBUTRIN  XL) 150 MG 24 hr tablet Take 1 tablet (150 mg total) by mouth every morning for 3 days, THEN 2 tablets (300 mg total) every morning. 90 tablet 1   busPIRone  (BUSPAR ) 5 MG tablet Take 1 tablet (5 mg total) by mouth 3 (three) times daily. 90 tablet 1   cetirizine (ZYRTEC) 10 MG tablet Take 10 mg by mouth daily. (Patient taking differently: Take 10 mg by mouth daily. Taking as  needed)     cholecalciferol (VITAMIN D3) 25 MCG (1000 UNIT) tablet Take 1,000 Units by mouth daily.     cyanocobalamin  (VITAMIN B12) 1000 MCG tablet Take 1,000 mcg by mouth daily.     dapagliflozin  propanediol (FARXIGA ) 10 MG TABS tablet Take 1 tablet (10 mg total) by mouth daily. 30 tablet 2   erythromycin  ophthalmic ointment APPLY 1 CM RIBBON INTO THE LOWER CONJUNCTIVAL SAC(S) IN THE AFFECTED EYE(S)  3 TIMES PER DAY FOR 7 DAYS 3.5 g 0   folic acid  (FOLVITE ) 1 MG tablet Take 1 mg by mouth daily.     ipratropium (ATROVENT ) 0.06 % nasal spray Place 2 sprays into both nostrils 4 (four) times daily. 15 mL 12   metoprolol  succinate  (TOPROL -XL) 100 MG 24 hr tablet Take 1 tablet (100 mg total) by mouth daily. Take 100 mg by mouth daily. Take with or immediately following a meal. 90 tablet 3   mexiletine (MEXITIL ) 200 MG capsule Take 1 capsule (200 mg total) by mouth 2 (two) times daily. 180 capsule 3   mirtazapine  (REMERON ) 15 MG tablet Take 1 tablet (15 mg total) by mouth at bedtime. 90 tablet 1   nicotine  (NICODERM CQ  - DOSED IN MG/24 HOURS) 14 mg/24hr patch Place 14 mg onto the skin as needed.     rosuvastatin  (CRESTOR ) 40 MG tablet Take 1 tablet (40 mg total) by mouth daily. 30 tablet 6   sacubitril -valsartan  (ENTRESTO ) 49-51 MG Take 1 tablet by mouth 2 (two) times daily. 60 tablet 11   sertraline  (ZOLOFT ) 100 MG tablet Take 1 tablet (100 mg total) by mouth daily. 90 tablet 3   sertraline  (ZOLOFT ) 25 MG tablet Take 1 tablet (25 mg total) by mouth daily. 30 tablet 3   ticagrelor  (BRILINTA ) 60 MG TABS tablet Take 1 tablet (60 mg total) by mouth 2 (two) times daily. 60 tablet 11   triamcinolone  ointment (KENALOG ) 0.5 % Apply 1 Application topically 2 (two) times daily. Use sparingly for < 1 week on hands with vaseline (Patient taking differently: Apply 1 Application topically 2 (two) times daily. Use sparingly for < 1 week on hands with vaseline  using as needed) 15 g 2   No current facility-administered medications for this visit.    No Known Allergies    Social History   Socioeconomic History   Marital status: Divorced    Spouse name: Asante Blanda   Number of children: 2   Years of education: 14   Highest education level: Not on file  Occupational History    Comment: [employer]  Tobacco Use   Smoking status: Former    Current packs/day: 0.00    Average packs/day: 0.3 packs/day for 20.0 years (5.0 ttl pk-yrs)    Types: Cigarettes    Start date: 09/02/2002    Quit date: 09/02/2022    Years since quitting: 1.8   Smokeless tobacco: Never   Tobacco comments:    I pack  20 cigarettes a week  wants  to  QUIT.    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- started [employer]   Social Drivers of Health   Financial Resource Strain: Not on file  Food Insecurity: No Food Insecurity (02/09/2024)   Hunger Vital Sign    Worried About Running Out of Food in the Last Year: Never true    Ran Out of Food in the Last Year: Never true  Transportation Needs: No Transportation Needs (02/09/2024)   PRAPARE - Administrator, Civil Service (Medical): No    Lack of Transportation (Non-Medical): No  Physical Activity: Not on file  Stress: Not on file  Social Connections: Not on file  Intimate Partner Violence: Unknown (02/09/2024)   Humiliation, Afraid, Rape, and Kick questionnaire    Fear of Current or Ex-Partner: Not on file    Emotionally Abused: No    Physically Abused: No    Sexually Abused: No      Family History  Problem Relation Age of Onset   Prostate cancer Father    Hyperlipidemia Father    Hypertension Father    Lung cancer Maternal Grandfather    Heart attack Maternal Grandfather 21       died   Alcohol abuse Paternal Grandfather    Hypertension Cousin    Hyperlipidemia Other    Heart attack Paternal Uncle 17       died   Heart attack Maternal Great-grandfather 18   Alcohol abuse Maternal Aunt     Vitals:   06/26/24 1108  BP: (!) 153/96  Pulse: 64  SpO2: 100%  Weight: 213 lb (96.6 kg)    PHYSICAL EXAM: General:  Well appearing. No resp difficulty HEENT: normal Neck: supple. no JVD. Carotids 2+ bilat; no bruits. No lymphadenopathy or thryomegaly appreciated. Cor: PMI nondisplaced. Regular rate & rhythm. No rubs, gallops or  murmurs. Lungs: clear Abdomen: soft, nontender, nondistended. No hepatosplenomegaly. No bruits or masses. Good bowel sounds. Extremities: no cyanosis, clubbing, rash, edema Neuro: alert & orientedx3, cranial nerves grossly intact. moves all 4 extremities w/o difficulty. Affect pleasant  ECG: NSR w/ 2 PVCs, personally reviewed    ASSESSMENT & PLAN:  1. Chronic Systolic Heart Failure - Ischemic +/- mixed nonischemic CM w/ ? PVC mediated component. Uncertain if PVCs are contributing to CM vs 2/2 cardiomyopathy/scar   - Peri MI echo 12/23 EF 35-40%, RV mildly reduce - Limited echo 4/24 EF 40-45%, RV nl  - Echo 10/24 EF 40-45%, RV nl  - Repeat LHC 12/24 widely patent RCA stents, normal left coronary system - cMRI 3/25 LVEF 30%, RVEF 41%, + transmural LGE scar in the basal-mid inferior wall (non viable). ECV and T2 values not documented.  - Zios  have shown high PVC burden (21%)  - s/p BSCi ICD 02/09/24 Marny) ICD interrogated today as per HPI  - Echo today 06/26/24 EF 40-45% inferior/inferolateral HK RV ok Personally reviewed - Stable NYHA I - Continue Entresto  49/51 bid - Continue Farxiga  10 mg daily  - Continue Toprol  Xl 100 mg daily  - Add spiro 25 daily. Check labs 1 week  - Went to [facility] - 2 visits but is busier at work so didn't go back   2. Frequent PVCs - Zio 6/24 21% burden - Zio 11/24 21% burden >>LHC w/ no obstructive CAD - Continue mexilitene 200 bid - seen by EP, suspect scar mediated from prior inferior infarct -sleep study mild OSA - stay off Adderall   - Repeat Zio to reassess burden on optimal therapy  3. CAD - s/p inferior MI 11/23, cath 1V CAD.  S/p p-mRCA and m-dRCA stents  - patent RCA stents on repeat cath 12/24 - Per Dr. Lesli. Stop ASA. Continue Brilinta  - continue Crestor  40 mg  - no s/s angina  4. Tobacco abuse - smoking a few cigs per week - discussed cessation   5. HTN - BP up. add spiro   6. OSA, mild - Sleep study 7/25 mild OSA (AHI 7)    Toribio Fuel, MD  11:34 AM

## 2024-06-26 NOTE — Addendum Note (Signed)
 Addended by: Hayleigh Bawa A on: 06/26/2024 12:10 PM   Modules accepted: Orders

## 2024-06-26 NOTE — Progress Notes (Signed)
 Zio patch placed onto patient.  All instructions and information reviewed with patient, they verbalize understanding with no questions.

## 2024-06-27 ENCOUNTER — Ambulatory Visit

## 2024-06-27 ENCOUNTER — Ambulatory Visit: Payer: Self-pay | Admitting: Cardiology

## 2024-06-27 LAB — CUP PACEART REMOTE DEVICE CHECK
Battery Remaining Longevity: 180 mo
Battery Remaining Percentage: 100 %
Brady Statistic RV Percent Paced: 0 %
Date Time Interrogation Session: 20250923003100
HighPow Impedance: 78 Ohm
Implantable Lead Connection Status: 753985
Implantable Lead Implant Date: 20250508
Implantable Lead Location: 753860
Implantable Lead Model: 673
Implantable Lead Serial Number: 263619
Implantable Pulse Generator Implant Date: 20250508
Lead Channel Impedance Value: 590 Ohm
Lead Channel Pacing Threshold Amplitude: 0.4 V
Lead Channel Pacing Threshold Pulse Width: 0.4 ms
Lead Channel Setting Pacing Amplitude: 3.5 V
Lead Channel Setting Pacing Pulse Width: 0.4 ms
Lead Channel Setting Sensing Sensitivity: 0.5 mV
Pulse Gen Serial Number: 352785
Zone Setting Status: 755011

## 2024-06-27 NOTE — Progress Notes (Signed)
Remote ICD Transmission.

## 2024-06-28 ENCOUNTER — Ambulatory Visit

## 2024-06-28 NOTE — Progress Notes (Signed)
Remote ICD Transmission.

## 2024-07-02 ENCOUNTER — Ambulatory Visit

## 2024-07-04 ENCOUNTER — Ambulatory Visit

## 2024-07-05 ENCOUNTER — Ambulatory Visit

## 2024-07-09 ENCOUNTER — Ambulatory Visit

## 2024-07-11 ENCOUNTER — Ambulatory Visit

## 2024-07-12 ENCOUNTER — Encounter: Payer: Self-pay | Admitting: Family

## 2024-07-12 ENCOUNTER — Other Ambulatory Visit: Payer: Self-pay

## 2024-07-12 ENCOUNTER — Ambulatory Visit: Admitting: Family

## 2024-07-12 ENCOUNTER — Ambulatory Visit

## 2024-07-12 ENCOUNTER — Ambulatory Visit: Payer: Self-pay | Admitting: Family

## 2024-07-12 VITALS — BP 130/80 | HR 64 | Temp 98.0°F | Ht 74.0 in | Wt 211.0 lb

## 2024-07-12 DIAGNOSIS — F32A Depression, unspecified: Secondary | ICD-10-CM

## 2024-07-12 DIAGNOSIS — E785 Hyperlipidemia, unspecified: Secondary | ICD-10-CM

## 2024-07-12 DIAGNOSIS — I2510 Atherosclerotic heart disease of native coronary artery without angina pectoris: Secondary | ICD-10-CM | POA: Diagnosis not present

## 2024-07-12 DIAGNOSIS — F325 Major depressive disorder, single episode, in full remission: Secondary | ICD-10-CM

## 2024-07-12 DIAGNOSIS — I255 Ischemic cardiomyopathy: Secondary | ICD-10-CM | POA: Diagnosis not present

## 2024-07-12 DIAGNOSIS — F419 Anxiety disorder, unspecified: Secondary | ICD-10-CM

## 2024-07-12 DIAGNOSIS — F3342 Major depressive disorder, recurrent, in full remission: Secondary | ICD-10-CM

## 2024-07-12 LAB — BASIC METABOLIC PANEL WITH GFR
BUN: 12 mg/dL (ref 6–23)
CO2: 29 meq/L (ref 19–32)
Calcium: 9.3 mg/dL (ref 8.4–10.5)
Chloride: 104 meq/L (ref 96–112)
Creatinine, Ser: 1 mg/dL (ref 0.40–1.50)
GFR: 87.13 mL/min (ref >60.00)
Glucose, Bld: 118 mg/dL — ABNORMAL HIGH (ref 70–99)
Potassium: 3.9 meq/L (ref 3.5–5.1)
Sodium: 140 meq/L (ref 135–145)

## 2024-07-12 LAB — LIPID PANEL
Cholesterol: 161 mg/dL (ref 0–200)
HDL: 69.3 mg/dL (ref >39.00)
LDL Cholesterol: 72 mg/dL (ref 0–99)
NonHDL: 92.17
Total CHOL/HDL Ratio: 2
Triglycerides: 100 mg/dL (ref 0.0–149.0)
VLDL: 20 mg/dL (ref 0.0–40.0)

## 2024-07-12 LAB — BRAIN NATRIURETIC PEPTIDE: Pro B Natriuretic peptide (BNP): 130 pg/mL — ABNORMAL HIGH (ref 0.0–100.0)

## 2024-07-12 MED ORDER — SERTRALINE HCL 25 MG PO TABS
25.0000 mg | ORAL_TABLET | Freq: Every day | ORAL | 3 refills | Status: AC
  Filled 2024-07-12 – 2024-09-14 (×2): qty 30, 30d supply, fill #0

## 2024-07-12 MED ORDER — BUPROPION HCL ER (XL) 150 MG PO TB24
300.0000 mg | ORAL_TABLET | ORAL | 3 refills | Status: AC
  Filled 2024-07-12 – 2024-11-01 (×2): qty 60, 30d supply, fill #0

## 2024-07-12 MED ORDER — BUSPIRONE HCL 5 MG PO TABS
5.0000 mg | ORAL_TABLET | Freq: Three times a day (TID) | ORAL | 3 refills | Status: AC
  Filled 2024-07-12: qty 90, 30d supply, fill #0

## 2024-07-12 MED ORDER — MIRTAZAPINE 15 MG PO TABS
15.0000 mg | ORAL_TABLET | Freq: Every day | ORAL | 3 refills | Status: AC
  Filled 2024-07-12: qty 30, 30d supply, fill #0

## 2024-07-12 NOTE — Patient Instructions (Signed)
 So happy for you.  Let me know if you need anything at all.

## 2024-07-12 NOTE — Assessment & Plan Note (Addendum)
 Chronic, stable.  Continue remeron  15mg  at bedtime, Wellbutrin  300 mg daily, Zoloft  125 mg daily.  BuSpar  5 mg 3 times daily

## 2024-07-12 NOTE — Assessment & Plan Note (Signed)
 S/p ICD. Reviewed cardiology follow up. Pending labs. Will follow.

## 2024-07-12 NOTE — Assessment & Plan Note (Signed)
 Discussed goal of LDL < 70. Continue crestor  40mg . Consider zetia. Pending lipid panel.

## 2024-07-12 NOTE — Progress Notes (Signed)
 Assessment & Plan:  Coronary artery disease involving native coronary artery of native heart without angina pectoris -     Lipid panel -     Basic metabolic panel with GFR -     Brain natriuretic peptide  Major depressive disorder with single episode, in full remission -     buPROPion  HCl ER (XL); Take 2 tablets (300 mg total) by mouth every morning.  Dispense: 180 tablet; Refill: 3 -     busPIRone  HCl; Take 1 tablet (5 mg total) by mouth 3 (three) times daily.  Dispense: 90 tablet; Refill: 3 -     Sertraline  HCl; Take 1 tablet (25 mg total) by mouth daily.  Dispense: 90 tablet; Refill: 3  Recurrent major depressive disorder, in full remission -     Mirtazapine ; Take 1 tablet (15 mg total) by mouth at bedtime.  Dispense: 90 tablet; Refill: 3  Ischemic cardiomyopathy Assessment & Plan: S/p ICD. Reviewed cardiology follow up. Pending labs. Will follow.    Anxiety and depression Assessment & Plan: Chronic, stable.  Continue remeron  15mg  at bedtime, Wellbutrin  300 mg daily, Zoloft  125 mg daily.  BuSpar  5 mg 3 times daily   Hyperlipidemia with target low density lipoprotein (LDL) cholesterol less than 55 mg/dL Assessment & Plan: Discussed goal of LDL < 70. Continue crestor  40mg . Consider zetia. Pending lipid panel.       Return precautions given.   Risks, benefits, and alternatives of the medications and treatment plan prescribed today were discussed, and patient expressed understanding.   Education regarding symptom management and diagnosis given to patient on AVS either electronically or printed.  No follow-ups on file.  Rollene Northern, FNP  Subjective:    Patient ID: Shane [name], male    DOB: July 04, 1971, 53 y.o.   MRN: [id number]  CC: Shane [name] is a 53 y.o. male who presents today for follow up.   HPI: Feels well today. No new complaints.   Wellbutrin  300mg  and zoloft  125mg  are working well for him. No increased anxiety   Denies CP.   ICD placed  02/09/24 , Dr Cindie Follow up Dr Cherrie 06/27/23 and started on Spironolactone  25 MG  Continued on  Entresto  49/51 bid, Farxiga  10 mg daily ,Toprol  Xl 100 mg daily    Allergies: Patient has no known allergies. Current Outpatient Medications on File Prior to Visit  Medication Sig Dispense Refill   aspirin  81 MG chewable tablet Chew 1 tablet (81 mg total) by mouth daily. 30 tablet 1   cetirizine (ZYRTEC) 10 MG tablet Take 10 mg by mouth daily.     cholecalciferol (VITAMIN D3) 25 MCG (1000 UNIT) tablet Take 1,000 Units by mouth daily.     cyanocobalamin  (VITAMIN B12) 1000 MCG tablet Take 1,000 mcg by mouth daily.     dapagliflozin  propanediol (FARXIGA ) 10 MG TABS tablet Take 1 tablet (10 mg total) by mouth daily. 30 tablet 2   erythromycin  ophthalmic ointment APPLY 1 CM RIBBON INTO THE LOWER CONJUNCTIVAL SAC(S) IN THE AFFECTED EYE(S)  3 TIMES PER DAY FOR 7 DAYS 3.5 g 0   metoprolol  succinate (TOPROL -XL) 100 MG 24 hr tablet Take 1 tablet (100 mg total) by mouth daily. Take 100 mg by mouth daily. Take with or immediately following a meal. 90 tablet 3   mexiletine (MEXITIL ) 200 MG capsule Take 1 capsule (200 mg total) by mouth 2 (two) times daily. 180 capsule 3   nicotine  (NICODERM CQ  - DOSED IN MG/24 HOURS) 14 mg/24hr  patch Place 14 mg onto the skin as needed.     rosuvastatin  (CRESTOR ) 40 MG tablet Take 1 tablet (40 mg total) by mouth daily. 30 tablet 6   sacubitril -valsartan  (ENTRESTO ) 49-51 MG Take 1 tablet by mouth 2 (two) times daily. 60 tablet 11   sertraline  (ZOLOFT ) 100 MG tablet Take 1 tablet (100 mg total) by mouth daily. 90 tablet 3   spironolactone  (ALDACTONE ) 25 MG tablet Take 1 tablet (25 mg total) by mouth daily. 30 tablet 11   ticagrelor  (BRILINTA ) 60 MG TABS tablet Take 1 tablet (60 mg total) by mouth 2 (two) times daily. 60 tablet 11   triamcinolone  ointment (KENALOG ) 0.5 % Apply 1 Application topically 2 (two) times daily. Use sparingly for < 1 week on hands with vaseline  (Patient taking differently: Apply 1 Application topically 2 (two) times daily. Use sparingly for < 1 week on hands with vaseline  using as needed) 15 g 2   folic acid  (FOLVITE ) 1 MG tablet Take 1 mg by mouth daily. (Patient not taking: Reported on 07/12/2024)     ipratropium (ATROVENT ) 0.06 % nasal spray Place 2 sprays into both nostrils 4 (four) times daily. 15 mL 12   No current facility-administered medications on file prior to visit.    Review of Systems  Constitutional:  Negative for chills and fever.  Respiratory:  Negative for cough.   Cardiovascular:  Negative for chest pain and palpitations.  Gastrointestinal:  Negative for nausea and vomiting.      Objective:    BP 130/80   Pulse 64   Temp 98 F (36.7 C) (Oral)   Ht 6' 2 (1.88 m)   Wt 211 lb (95.7 kg)   SpO2 98%   BMI 27.09 kg/m  BP Readings from Last 3 Encounters:  07/12/24 130/80  06/26/24 (!) 153/96  05/30/24 (!) 151/99   Wt Readings from Last 3 Encounters:  07/12/24 211 lb (95.7 kg)  06/26/24 213 lb (96.6 kg)  05/30/24 208 lb 6.4 oz (94.5 kg)    Physical Exam Vitals reviewed.  Constitutional:      Appearance: He is well-developed.  Cardiovascular:     Rate and Rhythm: Regular rhythm.     Heart sounds: Normal heart sounds.  Pulmonary:     Effort: Pulmonary effort is normal. No respiratory distress.     Breath sounds: Normal breath sounds. No wheezing, rhonchi or rales.  Skin:    General: Skin is warm and dry.  Neurological:     Mental Status: He is alert.  Psychiatric:        Speech: Speech normal.        Behavior: Behavior normal.

## 2024-07-16 ENCOUNTER — Ambulatory Visit

## 2024-07-16 NOTE — Addendum Note (Signed)
 Encounter addended by: Debarah Ricard MATSU, RN on: 07/16/2024 2:34 PM  Actions taken: Imaging Exam ended

## 2024-07-18 ENCOUNTER — Ambulatory Visit

## 2024-07-19 ENCOUNTER — Ambulatory Visit

## 2024-07-23 ENCOUNTER — Ambulatory Visit

## 2024-07-23 ENCOUNTER — Other Ambulatory Visit: Payer: Self-pay

## 2024-07-23 MED ORDER — METHOCARBAMOL 750 MG PO TABS
750.0000 mg | ORAL_TABLET | Freq: Every evening | ORAL | 0 refills | Status: AC | PRN
  Filled 2024-07-23: qty 15, 15d supply, fill #0

## 2024-07-25 NOTE — Progress Notes (Signed)
 LVM stating for pt to call office back to receive results. When pt calls back ok to give results and document

## 2024-08-06 ENCOUNTER — Other Ambulatory Visit: Payer: Self-pay

## 2024-08-06 MED ORDER — METHOCARBAMOL 500 MG PO TABS
500.0000 mg | ORAL_TABLET | Freq: Three times a day (TID) | ORAL | 0 refills | Status: AC | PRN
  Filled 2024-08-06: qty 42, 14d supply, fill #0

## 2024-08-25 ENCOUNTER — Ambulatory Visit (HOSPITAL_COMMUNITY): Payer: Self-pay | Admitting: Internal Medicine

## 2024-09-14 ENCOUNTER — Other Ambulatory Visit: Payer: Self-pay

## 2024-09-14 MED FILL — Rosuvastatin Calcium Tab 40 MG: ORAL | 30 days supply | Qty: 30 | Fill #2 | Status: AC

## 2024-09-18 ENCOUNTER — Other Ambulatory Visit: Payer: Self-pay

## 2024-09-25 ENCOUNTER — Ambulatory Visit

## 2024-09-25 DIAGNOSIS — I255 Ischemic cardiomyopathy: Secondary | ICD-10-CM

## 2024-09-26 LAB — CUP PACEART REMOTE DEVICE CHECK
Battery Remaining Longevity: 174 mo
Battery Remaining Percentage: 100 %
Brady Statistic RV Percent Paced: 0 %
Date Time Interrogation Session: 20251223042600
HighPow Impedance: 83 Ohm
Implantable Lead Connection Status: 753985
Implantable Lead Implant Date: 20250508
Implantable Lead Location: 753860
Implantable Lead Model: 673
Implantable Lead Serial Number: 263619
Implantable Pulse Generator Implant Date: 20250508
Lead Channel Impedance Value: 634 Ohm
Lead Channel Pacing Threshold Amplitude: 0.6 V
Lead Channel Pacing Threshold Pulse Width: 0.4 ms
Lead Channel Setting Pacing Amplitude: 3.5 V
Lead Channel Setting Pacing Pulse Width: 0.4 ms
Lead Channel Setting Sensing Sensitivity: 0.5 mV
Pulse Gen Serial Number: 352785
Zone Setting Status: 755011

## 2024-09-26 NOTE — Progress Notes (Signed)
 Remote ICD Transmission

## 2024-09-30 ENCOUNTER — Ambulatory Visit: Payer: Self-pay | Admitting: Cardiology

## 2024-10-05 ENCOUNTER — Telehealth: Payer: Self-pay

## 2024-10-05 NOTE — Telephone Encounter (Signed)
 Alert received from [employer] for Antitachycardia pacing (ATP) therapy delivered to convert arrhythmia.. Event occurred 10/04/24 @ 12:44, HR 192, EGM c/w 9 beats NSVT, break to SR, 12 beats NSVT, one sinus beat, 4 beats NSVT with pace termination to regular VS with PVC - route to triage 13 additional events 1/1, HR's 241-248, 3-14 beats in duration, some EGM's with several short runs of NSVT.  Attempted to contact patient. Phone went straight to VM, left message to call back.

## 2024-10-08 NOTE — Telephone Encounter (Signed)
 Attempted outreach to patient to assess for symptoms and notify him of 6 month driving restrictions  No answer  Left detailed message to call back

## 2024-10-09 NOTE — Telephone Encounter (Signed)
 Unable to speak w/ patient regarding recent device alert w/ ATP. Voicemail left on patient and patient mother number listed requesting a call back to the device clinic.   Unable to assess for symptoms and give 6 month DMV driving restrictions at this time.   Will continue to monitor and update accordingly.

## 2024-10-10 NOTE — Telephone Encounter (Signed)
 Sent mychart message

## 2024-10-11 NOTE — Telephone Encounter (Signed)
 Returned call to Pt.  He had further questions about driving restrictions.  We discussed this and all of his questions were answered.  Pt has a good understanding of why these restrictions exist.  He also states he recalls being symptomatic on date of VT episodes.  States he was at work when he recalled having some dizziness and he had to slow down.  Provided reassurance to Pt.  He is understandably emotional about current health situation.

## 2024-10-11 NOTE — Telephone Encounter (Signed)
 Call received from Pt after he received letter via MyChart.  Discussed Pt had episode of VT successfully treated with ATP.  We discussed what that means including driving restrictions.  He verbalizes understanding that he has been advised not to drive for 6 months.  Pt states he did not have any symptoms during episode.  He states he tries to take his medications as prescribed.  He has trouble at times with Mexitil  sticking in the back of his throat and he cannot swallow it.    Scheduled follow up with Suzann in [place] on 10/23/2024.  Advised Pt to contact clinic if any symptoms noted.  Will forward to physician for review.

## 2024-10-11 NOTE — Telephone Encounter (Signed)
 Pt called back LVM stating he went back and looked the mychart msg and he is confused about some of the wording and he would like a call back

## 2024-10-11 NOTE — Telephone Encounter (Signed)
 3 attempts made to contact Pt.  Sending certified letter.

## 2024-10-19 NOTE — Progress Notes (Signed)
 "     Electrophysiology Clinic Note    Date:  10/23/2024  Patient ID:  Shane, [name] May 27, 1971, MRN [id number] PCP:  Dineen Rollene MATSU, FNP  Cardiologist:  Alm Clay, MD  Cardiology APP:  Abigail Bernardino HERO, PA-C  Electrophysiologist:  Fonda Kitty, MD  Electrophysiology APP:  Aurianna Earlywine, NP   HF cardiologist: Bensimhon   Discussed the use of AI scribe software for clinical note transcription with the patient, who gave verbal consent to proceed.   Patient Profile    Chief Complaint: VT  History of Present Illness: Shane [name] is a 54 y.o. male with PMH notable for ICM s/p ICD, CAD s/p thrombectomy and PCI, HFr>mrEF, PVCs, HTN, HLD, tobacco use, ; seen today for Fonda Kitty, MD (Previously Dr. Cindie) for acute visit d/t VT on ICD  He is s/p single chamber ICD implant 02/10/2024 by Dr. Cindie  I saw him 05/2024 for routine 90d follow-up after ICD implant where patient was doing well. Device clinic was alerted 10/2023 for VF episode w ATP, and today's appt is follow-up from that.   Today, he admits to frequently missing medications around the time of his VT w ATP episode on Jan 1, but has since been diligently adherent. He remembers being a little dizzy on Jan 1, but no chest pain, chest pressure or palpitations. No presyncope or LOC.  He checks BP regularly, recalls readings of 120-150 systolic.  He does drink ETOH somewhat regularly, but did not around 1/1 episode.  He denies illicit substance use. He works as [employer], has to be at work at [employer] and has had difficulty getting to work at that time now that he is not allowed to drive.  He denies chest pain, chest pressure, SOB, DOE, increased edema, or palpitations.   Patient's mother joins for visit.     Arrhythmia/Device History [employer] single chamber ICD, imp 02/2024; dx HFrEF- JP   AAD -  Mexiletine    ROS:  Please see the history of present illness. All other systems are  reviewed and otherwise negative.    Physical Exam    VS:  BP (!) 150/90 (BP Location: Left Arm, Patient Position: Sitting, Cuff Size: Normal)   Pulse 68   Ht 6' 2 (1.88 m)   Wt 215 lb 14.4 oz (97.9 kg)   SpO2 99%   BMI 27.72 kg/m  BMI: Body mass index is 27.72 kg/m.      Wt Readings from Last 3 Encounters:  10/23/24 215 lb 14.4 oz (97.9 kg)  07/12/24 211 lb (95.7 kg)  06/26/24 213 lb (96.6 kg)     GEN- The patient is well appearing, alert and oriented x 3 today.   Lungs- Clear to ausculation bilaterally, normal work of breathing.  Heart- Regular rate and rhythm, no murmurs, rubs or gallops Extremities- No peripheral edema, warm, dry Skin-  device pocket well-healed, no tethering  Device interrogation done today and reviewed by myself:  Battery 14.5 years Lead thresholds, impedence, sensing stable  HeartLogic 0 Low VP  Several NSVT episodes on 1/1, 1/17 that fell into VT zone - 1/1 episode with NSVT episode, initiated by PVC with ATP x 1  Device adjustments made w assistance of rep (Joey) In VF zone - increased initial duration from 2.5s > 5s In VT zone -  - increased initial duration from 10s > 20s - increased ATP time out to 1.5 minutes   Studies Reviewed   Previous EP,  cardiology notes.    EKG is ordered. Personal review of EKG from today shows:    EKG Interpretation Date/Time:  Tuesday October 23 2024 09:54:58 EST Ventricular Rate:  68 PR Interval:  136 QRS Duration:  82 QT Interval:  434 QTC Calculation: 461 R Axis:   47  Text Interpretation: Normal sinus rhythm Nonspecific ST abnormality Confirmed by Vicky Mccanless [phone]) on 10/23/2024 9:58:31 AM    Rhythm strip - SR, no PVCs  Long term monitor, 07/16/2024 Patch Wear Time:  13 days and 7 hours (2025-09-23T12:06:46-0400 to 2025-10-06T19:47:41-0400)   Patient had a min HR of 38 bpm, max HR of 267 bpm, and avg HR of 79 bpm. Predominant underlying rhythm was Sinus Rhythm. Possible Ventricular Pacing  present. 28 Ventricular Tachycardia runs occurred, the run with the fastest interval lasting 4 beats with a max rate of 267 bpm, the longest lasting 19 beats with an avg rate of 232 bpm. 1 run of Supraventricular Tachycardia occurred lasting 12 beats with a max rate of 135 bpm (avg 123 bpm). Isolated SVEs were rare (<1.0%), SVE Couplets were rare (<1.0%), and SVE Triplets were rare (<1.0%). Isolated VEs were frequent (5.3%, 56005), VE Couplets were rare (<1.0%, 1727), and VE Triplets were rare (<1.0%, 58). Ventricular Bigeminy and Trigeminy were present.  TTE, 06/26/2024  1. Left ventricular ejection fraction, by estimation, is 40 to 45%. The left ventricle has mildly decreased function. The left ventricle demonstrates regional wall motion abnormalities (see scoring diagram/findings for description). The left ventricular  internal cavity size was mildly dilated. Left ventricular diastolic parameters are consistent with Grade I diastolic dysfunction (impaired relaxation).   2. Right ventricular systolic function is normal. The right ventricular size is normal.   3. The mitral valve is normal in structure. Trivial mitral valve regurgitation. No evidence of mitral stenosis.   4. The aortic valve is normal in structure. Aortic valve regurgitation is mild. No aortic stenosis is present.   5. The inferior vena cava is normal in size with greater than 50% respiratory variability, suggesting right atrial pressure of 3 mmHg.   Cardiac MRI, 12/21/2023 1. Moderate-severely reduced LV systolic function.  LVEF 30%.  2. There is LV global hypokinesis with inferior wall akinesis.  3. There is transmural LV LGE/scar in the basal-mid inferior wall (not viable).  4. LV LGE/scar comprises 31g (approximately 25%) of total LV myocardial mass.  5. Findings suggest ischemic cardiomyopathy with prior inferior wall infarct.  LHC, 09/15/2023   Previously placed Prox RCA to Mid RCA stent of unknown type is  widely patent.    1.  Widely patent RCA stents with no significant restenosis. Near normal left coronary arteries. 2.  Left ventricular angiography was not performed.  EF was mildly reduced by echo. 3.  Mildly elevated left ventricular end-diastolic pressure at 14 mmHg.  Long term monitor, 08/12/2023   ~7-day Zio patch monitor (10/26-11/11/2022)   Patient was in sinus rhythm with a rate range of 54 to 140 bpm and average of 87 bpm.   Isolated PVCs (premature ventricular contractions) were frequent (21.4%, 181245), PVC couplets were occasional (2.2%, 9155), and PVC triplets were rare (<1.0%, 269). Ventricular Bigeminy and Trigeminy were present.   Isolated PACs (premature atrial contractions) were rare (<1.0%),   No Sustained Arrhythmias: Atrial Tachycardia (AT), Supraventricular Tachycardia (SVT), Atrial Fibrillation (A-Fib), Atrial Flutter (A-Flutter), Sustained Ventricular Tachycardia (VT)   No significant pauses, bradycardia or heart block.    Assessment and Plan     #) MMVT #)  PVC #) ICM/NICM s/p ICD Device functioning well, see paceart for details VP < 1% Recent increase in NSVT episodes typically lasting 5-10 beats. Device appropriately treated w ATP. Device adjustments as above to not over-treat episodes Discussed adherence to medications Increase toprol  to 100mg  BID Continue mexiletine 200mg  BID Do not favor amiodarone  given young age, consider adding if continues to have NSVT episodes We discussed referral to [facility] for PVC ablation, patient will consider and notify me if he would like to pursue Update labs today Per [place] DMV, no driving for 6 months from each ATP/HV therapy episode         Current medicines are reviewed at length with the patient today.   The patient has concerns regarding his medicines.  The following changes were made today:   INCREASE toprol  to 100mg  twice a day  Labs/ tests ordered today include:  Orders Placed This Encounter  Procedures   CBC   Comprehensive  metabolic panel with GFR   Magnesium   TSH   EKG 12-Lead     Disposition: Follow up with Dr. Kennyth or EP APP in 3 months  Due for follow-up with HF MD   Signed, Chantal Needle, NP  10/23/24  4:45 PM  Electrophysiology [facility]  I spent 45 minutes caring for patient today, including 30 minutes face-to-face and 15 minutes discussing device interrogation with [employer] rep "

## 2024-10-23 ENCOUNTER — Ambulatory Visit: Attending: Cardiology | Admitting: Cardiology

## 2024-10-23 ENCOUNTER — Other Ambulatory Visit: Payer: Self-pay

## 2024-10-23 ENCOUNTER — Encounter: Payer: Self-pay | Admitting: Cardiology

## 2024-10-23 VITALS — BP 150/90 | HR 68 | Ht 74.0 in | Wt 215.9 lb

## 2024-10-23 DIAGNOSIS — Z95810 Presence of automatic (implantable) cardiac defibrillator: Secondary | ICD-10-CM

## 2024-10-23 DIAGNOSIS — I493 Ventricular premature depolarization: Secondary | ICD-10-CM

## 2024-10-23 DIAGNOSIS — I472 Ventricular tachycardia, unspecified: Secondary | ICD-10-CM | POA: Diagnosis not present

## 2024-10-23 MED ORDER — METOPROLOL SUCCINATE ER 100 MG PO TB24
100.0000 mg | ORAL_TABLET | Freq: Two times a day (BID) | ORAL | 3 refills | Status: AC
  Filled 2024-10-23: qty 180, 90d supply, fill #0

## 2024-10-23 NOTE — Patient Instructions (Addendum)
 Medication Instructions:  Your physician recommends the following medication changes.   INCREASE: Metoprolol  to 100 mg twice a day.  *If you need a refill on your cardiac medications before your next appointment, please call your pharmacy*  Lab Work: Your provider would like for you to have following labs drawn today CBC, CMP, Magnesium and TSH.     Testing/Procedures: No test ordered today   Follow-Up:  No driving for 6 months, starting January 1st, 2026  Follow up appointment with Heart Failure Clinic, Dr. Cherrie Only.   At [facility], you and your health needs are our priority.  As part of our continuing mission to provide you with exceptional heart care, our providers are all part of one team.  This team includes your primary Cardiologist (physician) and Advanced Practice Providers or APPs (Physician Assistants and Nurse Practitioners) who all work together to provide you with the care you need, when you need it.  Your next appointment:   3 month(s)  Provider:   Suzann Riddle, NP or Dr. Fonda Kitty

## 2024-10-24 ENCOUNTER — Ambulatory Visit: Payer: Self-pay | Admitting: Cardiology

## 2024-10-24 LAB — CBC
Hematocrit: 51.3 % — ABNORMAL HIGH (ref 37.5–51.0)
Hemoglobin: 17.5 g/dL (ref 13.0–17.7)
MCH: 33.3 pg — ABNORMAL HIGH (ref 26.6–33.0)
MCHC: 34.1 g/dL (ref 31.5–35.7)
MCV: 98 fL — ABNORMAL HIGH (ref 79–97)
Platelets: 305 x10E3/uL (ref 150–450)
RBC: 5.26 x10E6/uL (ref 4.14–5.80)
RDW: 12.2 % (ref 11.6–15.4)
WBC: 7.8 x10E3/uL (ref 3.4–10.8)

## 2024-10-24 LAB — COMPREHENSIVE METABOLIC PANEL WITH GFR
ALT: 53 IU/L — ABNORMAL HIGH (ref 0–44)
AST: 53 IU/L — ABNORMAL HIGH (ref 0–40)
Albumin: 4.5 g/dL (ref 3.8–4.9)
Alkaline Phosphatase: 139 IU/L — ABNORMAL HIGH (ref 47–123)
BUN/Creatinine Ratio: 7 — ABNORMAL LOW (ref 9–20)
BUN: 8 mg/dL (ref 6–24)
Bilirubin Total: 0.8 mg/dL (ref 0.0–1.2)
CO2: 22 mmol/L (ref 20–29)
Calcium: 10 mg/dL (ref 8.7–10.2)
Chloride: 98 mmol/L (ref 96–106)
Creatinine, Ser: 1.08 mg/dL (ref 0.76–1.27)
Globulin, Total: 3.1 g/dL (ref 1.5–4.5)
Glucose: 116 mg/dL — ABNORMAL HIGH (ref 70–99)
Potassium: 4.7 mmol/L (ref 3.5–5.2)
Sodium: 139 mmol/L (ref 134–144)
Total Protein: 7.6 g/dL (ref 6.0–8.5)
eGFR: 83 mL/min/1.73

## 2024-10-24 LAB — TSH: TSH: 3.43 u[IU]/mL (ref 0.450–4.500)

## 2024-10-24 LAB — MAGNESIUM: Magnesium: 2 mg/dL (ref 1.6–2.3)

## 2024-10-26 LAB — CUP PACEART INCLINIC DEVICE CHECK
Date Time Interrogation Session: 20260120170614
Implantable Lead Connection Status: 753985
Implantable Lead Implant Date: 20250508
Implantable Lead Location: 753860
Implantable Lead Model: 673
Implantable Lead Serial Number: 263619
Implantable Pulse Generator Implant Date: 20250508
Lead Channel Setting Pacing Amplitude: 2 V
Lead Channel Setting Pacing Pulse Width: 0.4 ms
Lead Channel Setting Sensing Sensitivity: 0.5 mV
Pulse Gen Serial Number: 352785
Zone Setting Status: 755011

## 2024-10-31 ENCOUNTER — Telehealth: Payer: Self-pay

## 2024-10-31 NOTE — Telephone Encounter (Signed)
 Patient denies ETOH.   States he was walking out to his mailbox, slipped on ice and fell during the alert time frame. Patient was asymptomatic from a cardiac stand point. Patient states he feels fine today other than sore from fall. Denies any injuries.   Patient is aware of [place] DMV driving restrictions X6 months/shock plan.   Routing to [place], NP to review.

## 2024-10-31 NOTE — Telephone Encounter (Signed)
 BSX ICD ALERT:   Alert remote transmission:  Antitachycardia pacing (ATP) therapy delivered to convert arrhythmia.  2 events 1/27 @ 15:09 and 15:20:    19-30sec in duration, HR's 248-250, both events pace terminated with 1 burst of ATP,    episode (V-94) VT resolves ~ 10sec after burst of ATP.    Programming changes made at OV w/ Suzann on 1/20 to tx zones also Toprol  was increased to 100mg  bid-  ___________________________________________________________________________________  LM for patient to return call and discuss.

## 2024-10-31 NOTE — Telephone Encounter (Signed)
Attempted to contact patient. No answer, left message to call back

## 2024-11-01 ENCOUNTER — Other Ambulatory Visit: Payer: Self-pay

## 2024-11-01 DIAGNOSIS — I472 Ventricular tachycardia, unspecified: Secondary | ICD-10-CM

## 2024-11-01 MED ORDER — AMIODARONE HCL 200 MG PO TABS
ORAL_TABLET | ORAL | 3 refills | Status: AC
  Filled 2024-11-01: qty 48, 34d supply, fill #0

## 2024-11-01 NOTE — Telephone Encounter (Signed)
 I spoke with Mr. Crisman with two updates from Suzann Riddle, NP. Patient is to start amiodarone  200 mg twice a day for two weeks and then reduce to 200 mg once a day. Patient was also referred to Dr. Clem Repress of [facility] for an ablation. Patient verbalized understanding and all questions answered at this time. Suzann Riddle, NP notified.

## 2024-11-01 NOTE — Telephone Encounter (Signed)
 I left a voicemail for Shane [name] to return my call in regards to a medication change and a referral from Suzann Riddle, NP.    As per Suzann: Could you please call patient to start amiodarone  - 200mg  amiodarone  BID x 2 weeks, then 200mg  daily.   The patient and I previously spoke about referral to [facility], and he wanted to delay to see if VT recurred,  please refer him to Clem Repress at [facility] for ablation.

## 2024-11-07 ENCOUNTER — Telehealth: Payer: Self-pay

## 2024-11-07 NOTE — Telephone Encounter (Signed)
 MyChart message received from Pt.  Asked if Pt had started amiodarone .  Also asked when Pt started amiodarone .  11/07/24  1:32 PM Yes, I am back on My Meds as of this Week.. When did this happen..? I was in the [place] longer than I probably should have been at Work the other Day.. ?    11/07/24  1:46 PM I started it last Friday.SABRA

## 2024-11-07 NOTE — Telephone Encounter (Signed)
 Attempted additional call to reach Pt.  Call went to VM.  Sent MyChart message.

## 2024-11-07 NOTE — Telephone Encounter (Signed)
 Alert received:  Alert remote transmission:  Antitachycardia pacing (ATP) therapy delivered to convert arrhythmia Event occurred 2/2 @ 13:44, HR 242, EGM c/w abrupt onset of VT,  pace terminated with one burst of ATP. 8 additional NSVT.

## 2024-11-07 NOTE — Telephone Encounter (Signed)
 Left detailed message requesting call back

## 2024-12-25 ENCOUNTER — Encounter

## 2025-01-07 ENCOUNTER — Ambulatory Visit: Admitting: Internal Medicine

## 2025-01-21 ENCOUNTER — Ambulatory Visit: Admitting: Cardiology

## 2025-03-26 ENCOUNTER — Encounter

## 2025-06-25 ENCOUNTER — Encounter

## 2025-09-24 ENCOUNTER — Encounter

## 2025-12-24 ENCOUNTER — Encounter
